# Patient Record
Sex: Female | Born: 1965
Health system: Southern US, Community
[De-identification: ages and names within clinical notes are randomized; demographics above are authoritative.]

## PROBLEM LIST (undated history)

## (undated) DIAGNOSIS — E041 Nontoxic single thyroid nodule: Secondary | ICD-10-CM

## (undated) DIAGNOSIS — K3184 Gastroparesis: Secondary | ICD-10-CM

## (undated) DIAGNOSIS — M84376A Stress fracture, unspecified foot, initial encounter for fracture: Secondary | ICD-10-CM

## (undated) DIAGNOSIS — M5126 Other intervertebral disc displacement, lumbar region: Secondary | ICD-10-CM

## (undated) DIAGNOSIS — F329 Major depressive disorder, single episode, unspecified: Secondary | ICD-10-CM

## (undated) DIAGNOSIS — J45909 Unspecified asthma, uncomplicated: Secondary | ICD-10-CM

## (undated) DIAGNOSIS — M4807 Spinal stenosis, lumbosacral region: Secondary | ICD-10-CM

## (undated) DIAGNOSIS — F32A Depression, unspecified: Secondary | ICD-10-CM

## (undated) DIAGNOSIS — IMO0002 Reserved for concepts with insufficient information to code with codable children: Secondary | ICD-10-CM

## (undated) DIAGNOSIS — K219 Gastro-esophageal reflux disease without esophagitis: Secondary | ICD-10-CM

## (undated) DIAGNOSIS — G509 Disorder of trigeminal nerve, unspecified: Secondary | ICD-10-CM

## (undated) DIAGNOSIS — T8859XA Other complications of anesthesia, initial encounter: Secondary | ICD-10-CM

## (undated) DIAGNOSIS — R071 Chest pain on breathing: Secondary | ICD-10-CM

## (undated) DIAGNOSIS — R0789 Other chest pain: Secondary | ICD-10-CM

## (undated) DIAGNOSIS — G4730 Sleep apnea, unspecified: Secondary | ICD-10-CM

## (undated) DIAGNOSIS — I10 Essential (primary) hypertension: Secondary | ICD-10-CM

## (undated) DIAGNOSIS — F419 Anxiety disorder, unspecified: Secondary | ICD-10-CM

## (undated) DIAGNOSIS — M719 Bursopathy, unspecified: Secondary | ICD-10-CM

## (undated) DIAGNOSIS — F952 Tourette's disorder: Secondary | ICD-10-CM

## (undated) DIAGNOSIS — M1990 Unspecified osteoarthritis, unspecified site: Secondary | ICD-10-CM

## (undated) DIAGNOSIS — T4145XA Adverse effect of unspecified anesthetic, initial encounter: Secondary | ICD-10-CM

## (undated) HISTORY — DX: Stress fracture, unspecified foot, initial encounter for fracture: M84.376A

## (undated) HISTORY — DX: Essential (primary) hypertension: I10

## (undated) HISTORY — DX: Reserved for concepts with insufficient information to code with codable children: IMO0002

## (undated) HISTORY — DX: Major depressive disorder, single episode, unspecified: F32.9

## (undated) HISTORY — DX: Gastroparesis: K31.84

## (undated) HISTORY — DX: Other intervertebral disc displacement, lumbar region: M51.26

## (undated) HISTORY — DX: Bursopathy, unspecified: M71.9

## (undated) HISTORY — DX: Nontoxic single thyroid nodule: E04.1

## (undated) HISTORY — DX: Chest pain on breathing: R07.1

## (undated) HISTORY — DX: Unspecified asthma, uncomplicated: J45.909

## (undated) HISTORY — DX: Gastro-esophageal reflux disease without esophagitis: K21.9

## (undated) HISTORY — DX: Disorder of trigeminal nerve, unspecified: G50.9

## (undated) HISTORY — DX: Other chest pain: R07.89

## (undated) HISTORY — PX: ESOPHAGOGASTRODUODENOSCOPY: SHX1529

## (undated) HISTORY — PX: COLONOSCOPY: SHX5424

## (undated) HISTORY — DX: Spinal stenosis, lumbosacral region: M48.07

## (undated) HISTORY — DX: Unspecified osteoarthritis, unspecified site: M19.90

## (undated) HISTORY — DX: Tourette's disorder: F95.2

## (undated) HISTORY — DX: Depression, unspecified: F32.A

## (undated) HISTORY — DX: Sleep apnea, unspecified: G47.30

---

## 2003-08-12 HISTORY — PX: CHOLECYSTECTOMY: SHX55

## 2006-08-11 HISTORY — PX: OTHER SURGICAL HISTORY: SHX169

## 2007-07-26 ENCOUNTER — Ambulatory Visit (HOSPITAL_COMMUNITY): Admission: RE | Admit: 2007-07-26 | Discharge: 2007-07-27 | Payer: Self-pay | Admitting: Neurosurgery

## 2007-08-12 HISTORY — PX: TEAR DUCT PROBING: SHX793

## 2008-06-29 ENCOUNTER — Encounter: Admission: RE | Admit: 2008-06-29 | Discharge: 2008-06-29 | Payer: Self-pay | Admitting: Neurosurgery

## 2010-12-24 NOTE — Op Note (Signed)
Penny Porter, Penny Porter                  ACCOUNT NO.:  [id number]   MEDICAL RECORD NO.:  [id number]          PATIENT TYPE:  OIB   LOCATION:  3599                         FACILITY:  [facility]   PHYSICIAN:  Donalee Citrin, M.D.        DATE OF BIRTH:  05/18/1966   DATE OF PROCEDURE:  07/26/2007  DATE OF DISCHARGE:                               OPERATIVE REPORT   PREOPERATIVE DIAGNOSIS:  C6 radiculopathy, left greater than right, from  cervical spondylosis with cervical spondylosis of C6.   PROCEDURE:  Intracervical diskectomy and fusion of C5-6, using a  cylinder allograft wedge and a 25-mm Venture plate with four 13 mm  variable angled screws.   SURGEON:  Donalee Citrin, M.D.   ASSISTANT:  Julio Sicks, M.D.   ANESTHESIA:  General endotracheal.   INDICATIONS FOR PROCEDURE:  The patient is a 45 year old female who has  had progressive worsening of the neck and bilateral arm pain through her  shoulders down below her elbows, with weakness in the left triceps at  about 4 to 4+/5.  Previously, an MRI scan showed a large central disk  herniation at C5-6, compressing the central part of the spinal cord, and  causing some foraminal stenosis, worse on the left.  The patient failed  outpatient conservative treatment with her physical exam and MRI  findings.  The patient was recommended to do a cervical diskectomy with  fusion.  The risks and benefits of the operation were explained to the  patient who understood and agreed to the proceed.   DESCRIPTION OF PROCEDURE:  The patient was brought to the OR and induced  under general anesthesia.  The patient was positioned supine, the neck  in slight extension, with 5 pounds of halter traction.  The right side  of the neck was prepped and draped in the usual sterile fashion.  A  preoperative x-ray localized the C4-5 disk space so a curvilinear  incision was made just inferior to this, just off the midline through  the anterior __________ and superficial layer of the  latissimus and  dissected in the __________ longitudinally.  The avascular plane of the  __________ was then developed up to the prevertebral fascia.  The  prevertebral fascia was then dissected with Kitners.  Intraoperative x-  ray confirmed mobilization of the C5-6 disk space at appropriate level  and annulotomy was done __________ scalpel marked the space and then the  longus coli was reflected laterally, and the self-retaining retractors  were placed.  At this point, the annulotomy was extended.  Pituitary  rongeurs were used to __________ of the annulus.  The high-speed drill  was used to drill down to the disk space, the posterior annulus and  posterior aspect of the complex.  At this point, the operating  microscope was draped under a field of microscopic illumination.  The  undersurface of the 4 and 5 end-plates were under-bitten.  There were  several  __________ disk remnants had migrated subligamentous and  compressing the central part of the spinal cord.  These were teased away  with  a black nerve hook and removed with pituitary rongeurs.  This  immediately decompressed the central canal.  The __________ was then  identified and teased off the dura, removed in piece-meal fashion.  Then  the undersurface of the end-plates were under-bitten reaching across to  both C6 pedicles and both C6 nerve roots were decompressed __________ on  the pedicle.  There was noted to be extensive epidural venous plexus  around each nerve root.  These were coagulated and divided under direct  visualization.  The proximal aspect of the C5-6 nerve roots were  immediately visualized and decompressed and confirmed with passing of  the nerve hook.  After adequate decompression was achieved, the end-  plates were scraped.  A size 7-mm graft was then inserted.  This had  good apposition of the end-plates.  We then inserted 1 to 2 mm deep to  the anterior vertebral body line.  Then a 25-mm ventral plate was  sized  and inserted, and four 13-mm screws were all placed.  All screws had  excellent purchase, and the locking mechanism was engaged.  At this  point, meticulous hemostasis was maintained.  The wound was copiously  irrigated.  The platysma was reapproximated with interrupted Vicryl and  the skin was closed with running 4-0 subcuticular, and Benzoin and Steri-  Strips were applied.  The patient was taken to the recovery room in  stable condition.  At the end of the case, instrument and sponge counts  were correct.           ______________________________  Donalee Citrin, M.D.     GC/MEDQ  D:  07/26/2007  T:  07/27/2007  Job:  [id number]

## 2011-05-19 LAB — CBC
Hemoglobin: 13.6
MCHC: 34.1
Platelets: 285
WBC: 6.8

## 2011-05-19 LAB — BASIC METABOLIC PANEL
Calcium: 8.7
Chloride: 103
Creatinine, Ser: 0.81
Sodium: 138

## 2011-06-18 DIAGNOSIS — R519 Headache, unspecified: Secondary | ICD-10-CM | POA: Insufficient documentation

## 2012-12-23 DIAGNOSIS — G500 Trigeminal neuralgia: Secondary | ICD-10-CM | POA: Insufficient documentation

## 2012-12-23 DIAGNOSIS — M47816 Spondylosis without myelopathy or radiculopathy, lumbar region: Secondary | ICD-10-CM | POA: Insufficient documentation

## 2013-01-13 DIAGNOSIS — K3184 Gastroparesis: Secondary | ICD-10-CM | POA: Insufficient documentation

## 2013-11-29 ENCOUNTER — Encounter (HOSPITAL_COMMUNITY): Payer: Self-pay | Admitting: Psychiatry

## 2013-11-29 ENCOUNTER — Ambulatory Visit (INDEPENDENT_AMBULATORY_CARE_PROVIDER_SITE_OTHER): Payer: PRIVATE HEALTH INSURANCE | Admitting: Psychiatry

## 2013-11-29 VITALS — BP 147/97 | HR 72 | Ht 69.0 in | Wt 256.0 lb

## 2013-11-29 DIAGNOSIS — F32A Depression, unspecified: Secondary | ICD-10-CM

## 2013-11-29 DIAGNOSIS — F329 Major depressive disorder, single episode, unspecified: Secondary | ICD-10-CM | POA: Insufficient documentation

## 2013-11-29 DIAGNOSIS — F3342 Major depressive disorder, recurrent, in full remission: Secondary | ICD-10-CM | POA: Insufficient documentation

## 2013-11-29 DIAGNOSIS — F952 Tourette's disorder: Secondary | ICD-10-CM | POA: Insufficient documentation

## 2013-11-29 DIAGNOSIS — F332 Major depressive disorder, recurrent severe without psychotic features: Secondary | ICD-10-CM

## 2013-11-29 MED ORDER — PIMOZIDE 1 MG PO TABS: ORAL_TABLET | ORAL | Status: DC

## 2013-11-29 MED ORDER — ARIPIPRAZOLE 2 MG PO TABS: 2.0000 mg | ORAL_TABLET | Freq: Every day | ORAL | Status: DC

## 2013-11-29 NOTE — Patient Instructions (Signed)
Orap 0.5mg  for 5 days then stop. After stopping wait 3 days then start taking 2mg  of Abilify daily.  Continue Wellbutrin   Due to untreated sleep apnea do not sedating meds at bedtime such as Clonazepam and pain meds.   Fax labs and EKG to [phone]

## 2013-11-29 NOTE — Progress Notes (Signed)
Psychiatric Assessment Adult  Patient Identification:  Penny Porter Date of Evaluation:  11/29/2013 Chief Complaint: I want off the Wellbutrin for depression History of Chief Complaint:   Chief Complaint  Patient presents with  . Medication Reaction    HPI Comments: Pt has been suffering from depression since 2005 that was well controlled with Wellbutrin. In 2014 due to multiple stressors- uncle died in 2013-04-01, mother dx with breast cancer, father passed in Sept 2014- depression worsened. Pt also had panic attacks in Sept that resolved on its own.   Now out on short term disability due to pain. Feels depressed due to pain. Feels down 2-3x/week for a few hours. Endorsing random crying spells that recently decreased in frequency. Working with a Transport planner and finds it helpful. Endorsing anhedonia and low motivation most of the time. Part of this is due to constant pain. Sleep is poor (fragmented) due to pain and pt does have sleep apnea and doesn't use a machine. Due to gastroparesis appetite is variable. Prednisone causes increased depression and mood liability currently on day 5 of 10 day taper.  Denies SI/HI. Has only 1-2 friends and denies relationships.   Diagnosed with motor and phonic tics in HS. Tics are pretty well controlled. States she notices with Clonazepam the abdominal tics are better. She was having stomach pulling and jerking sensations that were bothersome. In the past she was having arm and shoulder jerks but that improved with Orap. Vocal tics was like a "hiccup that wouldn't go away" improved with Orap, last time was in 1999. Today she notes only when stressed does she have arm jerks. Notes that she has sleep apnea but doesn't use a CPAP and is taking Clonazepam at bedtime.  Review of Systems  Constitutional: Positive for appetite change and fatigue.  HENT: Negative.   Eyes: Negative.   Respiratory: Negative.   Cardiovascular: Negative.   Gastrointestinal: Positive for  abdominal pain.  Musculoskeletal: Positive for arthralgias, back pain, gait problem and myalgias.  Neurological: Positive for weakness.  Psychiatric/Behavioral: Positive for sleep disturbance and dysphoric mood.   Physical Exam  Psychiatric: Her speech is normal and behavior is normal. Judgment and thought content normal. Cognition and memory are normal. She exhibits a depressed mood.    Depressive Symptoms: depressed mood, anhedonia, insomnia, fatigue, feelings of worthlessness/guilt, difficulty concentrating, hopelessness, decreased appetite,  (Hypo) Manic Symptoms:   Elevated Mood:  No Irritable Mood:  No Grandiosity:  No Distractibility:  No Labiality of Mood:  No Delusions:  No Hallucinations:  No Impulsivity:  No Sexually Inappropriate Behavior:  No Financial Extravagance:  No Flight of Ideas:  No  Anxiety Symptoms: Excessive Worry:  Anxiety has improved due to reading to distract herself. Last time was earlier this month and was mostly centered around obtaining disability and paying bills.  Panic Symptoms:  Last time was Sept 2014- SOB, throat closing up, excessive anxiety, came on suddenly and went away on its own.  Agoraphobia:  No Obsessive Compulsive: No  Symptoms: None, Specific Phobias:  No Social Anxiety:  No  Psychotic Symptoms:  Hallucinations: No None Delusions:  No Paranoia:  No   Ideas of Reference:  No  PTSD Symptoms: Ever had a traumatic exposure:  No Had a traumatic exposure in the last month:  No Re-experiencing: No None Hypervigilance:  No Hyperarousal: No None Avoidance: No None  Traumatic Brain Injury: No   Past Psychiatric History: Diagnosis: Depression and Tourettes  Hospitalizations: denies  Outpatient Care: [facility] until Dec  2014- was unhappy with treatment recommendations so stopped going  Substance Abuse Care: denies  Self-Mutilation: denies  Suicidal Attempts: denies  Violent Behaviors: denies   Past Medical  History:   Past Medical History  Diagnosis Date  . Asthma   . Depression   . GERD (gastroesophageal reflux disease)   . Hypertension   . Tourette disease   . Gastroparesis   . Trigeminal nerve disease   . Costochondral chest pain   . Arthritis     Back and hip  . Stenosis of lumbosacral spine   . Degenerative disk disease   . Bursitis   . Thyroid nodule   . Sleep apnea     not using CPAP   History of Loss of Consciousness:  No Seizure History:  No Cardiac History:  No Allergies:   Allergies  Allergen Reactions  . Amoxicillin Hives  . Bextra [Valdecoxib] Hives and Swelling  . Brintellix [Vortioxetine] Nausea And Vomiting  . Doxycycline Hyclate Hives and Swelling  . Fetzima [Levomilnacipran]     Tremors across head   . Gadolinium Derivatives Hives  . Lamictal [Lamotrigine] Hives  . Macrolides And Ketolides   . Naproxen Hypertension  . Percolone [Oxycodone] Hives  . Relafen [Nabumetone] Hypertension  . Risperdal [Risperidone]     Leg weakness    Current Medications:  Current Outpatient Prescriptions  Medication Sig Dispense Refill  . albuterol (PROVENTIL HFA;VENTOLIN HFA) 108 (90 BASE) MCG/ACT inhaler Inhale into the lungs every 6 (six) hours as needed for wheezing or shortness of breath.      Marland Kitchen aspirin 81 MG tablet Take 81 mg by mouth daily.      Marland Kitchen buPROPion (WELLBUTRIN XL) 300 MG 24 hr tablet Take 300 mg by mouth daily.      . Cholecalciferol (VITAMIN D-3) 5000 UNITS TABS Take 1 tablet by mouth daily.      . clonazePAM (KLONOPIN) 0.5 MG tablet Take 0.5 mg by mouth. Take 1/2 tab po TID and 1 tab po qHS      . cyclobenzaprine (FLEXERIL) 10 MG tablet Take 10 mg by mouth. Take 5-10mg  po TID as needed      . diclofenac (FLECTOR) 1.3 % PTCH Place 1 patch onto the skin 2 (two) times daily.      . Diclofenac (ZORVOLEX) 18 MG CAPS Take 18 mg by mouth daily.      . diclofenac sodium (VOLTAREN) 1 % GEL Apply 1 g topically 2 (two) times daily.      Marland Kitchen dicyclomine (BENTYL) 10  MG capsule Take 10 mg by mouth 3 (three) times daily before meals.      . felodipine (PLENDIL) 5 MG 24 hr tablet Take 5 mg by mouth daily.      . fexofenadine (ALLEGRA) 180 MG tablet Take 180 mg by mouth daily.      . furosemide (LASIX) 20 MG tablet 20 mg. Take 1-2 tabs po up to twice a day as needed for edema      . HYDROcodone-acetaminophen (NORCO) 7.5-325 MG per tablet Take 1 tablet by mouth every 6 (six) hours as needed for moderate pain.      . Linaclotide (LINZESS) 290 MCG CAPS capsule Take 290 mcg by mouth daily.      . metoprolol succinate (TOPROL-XL) 100 MG 24 hr tablet Take 50 mg by mouth daily. Take with or immediately following a meal.      . mometasone (ASMANEX) 220 MCG/INH inhaler Inhale 2 puffs into the lungs daily.      Marland Kitchen  olmesartan (BENICAR) 20 MG tablet Take 20 mg by mouth daily.      . ondansetron (ZOFRAN-ODT) 4 MG disintegrating tablet Take 4 mg by mouth 2 (two) times daily as needed for nausea or vomiting.      . Pimozide 1 MG TABS Take 1 mg by mouth daily.      . predniSONE (STERAPRED UNI-PAK) 10 MG tablet Take by mouth daily. taper      . pregabalin (LYRICA) 50 MG capsule Take 50 mg by mouth. Take 1 tab po qAM and 2 tabs qPM      . RABEprazole (ACIPHEX) 20 MG tablet Take 20 mg by mouth daily.      . ranitidine (ZANTAC) 300 MG tablet Take 300 mg by mouth at bedtime.      Marland Kitchen spironolactone (ALDACTONE) 25 MG tablet Take 25 mg by mouth daily.      . traMADol (ULTRAM) 50 MG tablet Take by mouth. 1-2 tabs po q6hr as needed for pain      . vitamin B-12 (CYANOCOBALAMIN) 1000 MCG tablet Take 1,000 mcg by mouth daily.      . vitamin E 400 UNIT capsule Take 400 Units by mouth daily.      . ARIPiprazole (ABILIFY) 2 MG tablet Take 1 tablet (2 mg total) by mouth daily.  30 tablet  1  . Pimozide 1 MG TABS Take 0.5mg  po qAM for 5 days then stop  3 tablet  0   No current facility-administered medications for this visit.    Previous Psychotropic Medications:  Medication Dose/Reaction    Lamictal Allergic rx  Risperdal Leg weakness  Brintellix GI distress  Clonidine ineffective            Substance Abuse History in the last 12 months: Substance Age of 1st Use Last Use Amount Specific Type  Nicotine  denies        Alcohol  denies        Cannabis  denies        Opiates  denies        Cocaine  denies        Methamphetamines  denies        LSD  denies        Ecstasy  denies         Benzodiazepines  denies        Caffeine  denies        Inhalants  denies        Others:                          Medical Consequences of Substance Abuse: denies   Legal Consequences of Substance Abuse: denies   Family Consequences of Substance Abuse: denies  Blackouts:  No DT's:  No Withdrawal Symptoms:  No None  Social History: Current Place of Residence: [place] of Birth: [place] Family Members: living with mom and 2 sisters Marital Status:  Single Children: denies Relationships: denies Education:  Administrator, sports Problems/Performance: fair Religious Beliefs/Practices: sort of but wants to rejoin [employer] History of Abuse: none 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 Prior to back pain she used to work in the [employer] History:  None. Legal History: denies Hobbies/Interests: reading  Family History:   Family History  Problem Relation Age of Onset  . Mental retardation Sister   . Dementia Father     Mental Status Examination/Evaluation: Objective:  Appearance: Fairly  Groomed  Engineer, water::  Good  Speech:  Clear and Coherent and Normal Rate  Volume:  Normal  Mood:  depressed  Affect:  Congruent and Full Range  Thought Process:  Goal Directed, Linear and Logical  Orientation:  Full (Time, Place, and Person)  Thought Content:  WDL  Suicidal Thoughts:  No  Homicidal Thoughts:  No  Judgement:  Fair  Insight:  Fair  Psychomotor Activity:  Normal  Akathisia:  No  Handed:  Right  AIMS (if indicated):  0   Assets:  Communication Skills Desire for Improvement Financial Resources/Insurance Housing Resilience Social Support Transportation    Laboratory/X-Ray Psychological Evaluation(s)   denies denies   Assessment:  Axis I: Major Depression, Recurrent severe and Tourettes  AXIS I Major Depression, Recurrent severe and Tourettes  AXIS II Deferred  AXIS III Past Medical History  Diagnosis Date  . Asthma   . Depression   . GERD (gastroesophageal reflux disease)   . Hypertension   . Tourette disease   . Gastroparesis   . Trigeminal nerve disease   . Costochondral chest pain   . Arthritis     Back and hip  . Stenosis of lumbosacral spine   . Degenerative disk disease   . Bursitis   . Thyroid nodule   . Sleep apnea     not using CPAP     AXIS IV occupational problems, other psychosocial or environmental problems and problems with access to health care services  AXIS V 51-60 moderate symptoms   Treatment Plan/Recommendations:  Plan of Care:   Start trial of Abilify 2mg  for mood augmentation and treatment of tics, risks/benefits and SE of the medication discussed. Pt verbalized understanding and verbal consent obtained for treatment.    Laboratory:  pt reports she had labs and EKG done recently and will fax over results.  Psychotherapy: continue therapy with Ms. Malachi Paradise  Medications: Continue Orap 0.5mg  for 5 days then stop. After stopping wait 3 days then start taking 2mg  of Abilify daily for mood augmentation and tics  Continue Wellbutrin XR 300mg  for depression  Due to untreated sleep apnea do not use sedating meds at bedtime such as Clonazepam and pain meds.    Routine PRN Medications:  Yes Clonazepam  Consultations: none at this time  Safety Concerns:  Pt denies SI and is at an acute low risk for suicide. Crisis plan reviewed and discussed. Pt verbalized understanding.   Other:  F/up in 2 months or sooner if needed    Charlcie Cradle, MD 4/21/20152:49  PM

## 2013-12-01 ENCOUNTER — Ambulatory Visit (HOSPITAL_COMMUNITY): Payer: Self-pay | Admitting: Psychiatry

## 2013-12-02 ENCOUNTER — Telehealth (HOSPITAL_COMMUNITY): Payer: Self-pay

## 2013-12-02 NOTE — Telephone Encounter (Signed)
I do not recommend any sedating medications at bedtime due to untreated sleep apnea. We discussed the risks of sedating meds and untreated sleep apnea during our last session.

## 2013-12-05 ENCOUNTER — Telehealth (HOSPITAL_COMMUNITY): Payer: Self-pay | Admitting: *Deleted

## 2013-12-05 NOTE — Telephone Encounter (Signed)
Patient left [id number].Took last dose of medication this morning.Had symtpoms yesterday of vocal tics/grunting and stomach jerking.Not sure if related to nerves or stopping medication.  Contacted pt: Not sure if related to stopping medication or not. Reminded pt she hd not stopped medication when symptoms occurred. Pt stated this was true. Also finished 10 day course of Prednisone yesterday, so not sure if this is related. Patient also stated she has not stopped Clonazepam at bedtime as directed, is continuing to take 1. Informed pt that per MD last instructions she is not to take at bedtime. Pt states she fears not sleeping and having tics get worse. Also states fear of what will happen once Orap is stopped and Abilify is started 3 days later. Informed patient that information will be placed in MD electronic  "In Basket" for review.

## 2013-12-05 NOTE — Telephone Encounter (Signed)
Opened twice in error

## 2013-12-06 NOTE — Telephone Encounter (Signed)
Penny Porter can start taking Abilify right away once she has completely stopped Orap.

## 2013-12-06 NOTE — Telephone Encounter (Signed)
Contacted pt: Per Dr. Doyne Keel, may start Abilify as soon as completely stopped Orap.Pt states last Orap was yesterday and she will star Abilify in the morning. Asked if MD had mentioned anything about klonopin.Informed her that message was only regarding Abilify

## 2013-12-13 ENCOUNTER — Telehealth (HOSPITAL_COMMUNITY): Payer: Self-pay | Admitting: *Deleted

## 2013-12-13 NOTE — Telephone Encounter (Signed)
Patient left YM:EBRA will Dr. Doyne Keel want to increase the Abilify? Stomach jerking/vocal tics from Tourettes continue.Also having arm jerking.Requests call from MD. Home [phone], cell [phone]

## 2013-12-13 NOTE — Telephone Encounter (Signed)
Pt reports she had stomach jerking for several days (occurring today) and one day of grunting last week. Arm jerking last occurred yesterday. Today she is feeling overwhelmed and depressed. States pain is getting worse. Short term disability is temporarily on hold. Also reports she needs to get new insurance.  Stomach jerking responded best when she took Klonopin at bedtime.   Taking Abilify 2mg  qD, Klonopin TID.  Plan: Continue Abilify 2mg  qD and Klonopin as prescribed.

## 2013-12-19 ENCOUNTER — Telehealth (HOSPITAL_COMMUNITY): Payer: Self-pay | Admitting: *Deleted

## 2013-12-19 DIAGNOSIS — F952 Tourette's disorder: Secondary | ICD-10-CM

## 2013-12-19 NOTE — Telephone Encounter (Signed)
Left VM--Patient left # [phone] or Home #[id number] like to talk with MD>Please call 5/12 after11:30.Was aked to call back and report on tics.Not really better and wants to discuss other treatment options.

## 2013-12-20 ENCOUNTER — Telehealth (HOSPITAL_COMMUNITY): Payer: Self-pay

## 2013-12-20 MED ORDER — ARIPIPRAZOLE 5 MG PO TABS: 5.0000 mg | ORAL_TABLET | Freq: Every day | ORAL | Status: DC

## 2013-12-20 MED ORDER — CLONAZEPAM 0.5 MG PO TABS: ORAL_TABLET | ORAL | Status: DC

## 2013-12-20 NOTE — Addendum Note (Signed)
Addended by: Charlcie Cradle on: 12/20/2013 02:24 PM   Modules accepted: Orders

## 2013-12-20 NOTE — Telephone Encounter (Signed)
Pt reports she is still having stomach jerking on a daily basis, almost all day. It sometimes prevents her from eating. With stress she also experiences arm jerking. Very rarely she will have a vocal tic.  A/P:Major Depression, Recurrent severe and Tourettes  Increase Abilify to 5mg  po qD. Continue Wellbutrin XR 300mg  for depression Continue Klonopin 0.5mg  BID and 0.25mg  qHS. F/up January 31, 2014 or sooner if needed.

## 2014-01-31 ENCOUNTER — Ambulatory Visit (INDEPENDENT_AMBULATORY_CARE_PROVIDER_SITE_OTHER): Payer: PRIVATE HEALTH INSURANCE | Admitting: Psychiatry

## 2014-01-31 ENCOUNTER — Encounter (HOSPITAL_COMMUNITY): Payer: Self-pay | Admitting: Psychiatry

## 2014-01-31 VITALS — BP 133/83 | HR 93 | Ht 69.0 in | Wt 248.0 lb

## 2014-01-31 DIAGNOSIS — F332 Major depressive disorder, recurrent severe without psychotic features: Secondary | ICD-10-CM

## 2014-01-31 DIAGNOSIS — F952 Tourette's disorder: Secondary | ICD-10-CM

## 2014-01-31 MED ORDER — ARIPIPRAZOLE 5 MG PO TABS: 7.5000 mg | ORAL_TABLET | Freq: Every day | ORAL | Status: DC

## 2014-01-31 MED ORDER — BUPROPION HCL ER (XL) 300 MG PO TB24: 300.0000 mg | ORAL_TABLET | Freq: Every day | ORAL | Status: DC

## 2014-01-31 MED ORDER — CLONAZEPAM 0.5 MG PO TABS: ORAL_TABLET | ORAL | Status: DC

## 2014-01-31 NOTE — Progress Notes (Signed)
[place] [phone] Progress Note  Penny Porter [id number] 48 y.o.  01/31/2014 12:59 PM  Chief Complaint: medication management- Overall doing ok  History of Present Illness: Today pt is feeling weak b/c she is recoving from dehydration and went to the ED for IV fluids. Appetite is poor and everything she eats makes her feel sick. She is going to a nutrionilist next week for gastroparesis. Pt is under the care of her PCP who advised small meals thru out the day.  Pt states overall she is doing well. Increased dose of Abilify is helping to control her stomach and arm jerks except when she is stressed. Occasionally will have some stomach jerks but with Klonopin the frequency is much decreased. Asking to take Klonopin at night b/c she feels that is when it helps the most to control the jerks. Pt takes her last Klonopin of the day at 5pm with dinner. Pt typically lies down around 10pm or after for sleep. She is happy to be off the Orap.   Depression is on/off. Back problems and pain causes a lot of sad mood. Crying spelsl are less frequent. Pt has on/off worthlessness and hopelessness mostly when she is in pain. Endorsing low motivation and anhedonia.   Sleep is poor. This is due to pain and sleep apnea. Pt tried using a CPAP for several months but could not tolerate it. Pt is not taking Klonopin at bedtime. She is getting about 4 hrs total. Pt is not napping during the day. Energy is low.  Anxiety is getting better. She is trying to keep herself distracted.  Suicidal Ideation: No Plan Formed: No Patient has means to carry out plan: No  Homicidal Ideation: No Plan Formed: No Patient has means to carry out plan: No  Review of Systems: Psychiatric: Agitation: Yes- irritable with family and will be verbally aggressive with them Hallucination: No Depressed Mood: Yes Insomnia: Yes Hypersomnia: No Altered Concentration: No Feels Worthless: Yes Grandiose Ideas: No Belief In Special  Powers: No New/Increased Substance Abuse: No Compulsions: No  Neurologic: Headache: No Seizure: No Paresthesias: No  Past Medical Family, Social History: Pt lives in [place] with her mom and 2 sisters. Pt is single and does not have children.  Pt denies hx of or current use of cigs, alcohol and illicit drugs.   Outpatient Encounter Prescriptions as of 01/31/2014  Medication Sig  . albuterol (PROVENTIL HFA;VENTOLIN HFA) 108 (90 BASE) MCG/ACT inhaler Inhale into the lungs every 6 (six) hours as needed for wheezing or shortness of breath.  . ARIPiprazole (ABILIFY) 5 MG tablet Take 1 tablet (5 mg total) by mouth daily.  Marland Kitchen aspirin 81 MG tablet Take 81 mg by mouth daily.  Marland Kitchen buPROPion (WELLBUTRIN XL) 300 MG 24 hr tablet Take 300 mg by mouth daily.  . Cholecalciferol (VITAMIN D-3) 5000 UNITS TABS Take 1 tablet by mouth daily.  . clonazePAM (KLONOPIN) 0.5 MG tablet Take 1 tab po qAM and lunch and 1/2 tab po qHS  . cyclobenzaprine (FLEXERIL) 10 MG tablet Take 10 mg by mouth. Take 5-10mg  po TID as needed  . diclofenac (FLECTOR) 1.3 % PTCH Place 1 patch onto the skin 2 (two) times daily.  . Diclofenac (ZORVOLEX) 18 MG CAPS Take 18 mg by mouth daily.  . diclofenac sodium (VOLTAREN) 1 % GEL Apply 1 g topically 2 (two) times daily.  Marland Kitchen dicyclomine (BENTYL) 10 MG capsule Take 10 mg by mouth 3 (three) times daily before meals.  . felodipine (PLENDIL) 5 MG  24 hr tablet Take 5 mg by mouth daily.  . fexofenadine (ALLEGRA) 180 MG tablet Take 180 mg by mouth daily.  Marland Kitchen HYDROcodone-acetaminophen (NORCO) 7.5-325 MG per tablet Take 1 tablet by mouth every 6 (six) hours as needed for moderate pain.  . Linaclotide (LINZESS) 290 MCG CAPS capsule Take 290 mcg by mouth daily.  . metoprolol succinate (TOPROL-XL) 100 MG 24 hr tablet Take 50 mg by mouth daily. Take with or immediately following a meal.  . mometasone (ASMANEX) 220 MCG/INH inhaler Inhale 2 puffs into the lungs daily.  Marland Kitchen olmesartan (BENICAR) 20 MG tablet  Take 20 mg by mouth daily.  . ondansetron (ZOFRAN-ODT) 4 MG disintegrating tablet Take 4 mg by mouth 2 (two) times daily as needed for nausea or vomiting.  . Pimozide 1 MG TABS Take 0.5mg  po qAM for 5 days then stop  . Pimozide 1 MG TABS Take 1 mg by mouth daily.  . predniSONE (STERAPRED UNI-PAK) 10 MG tablet Take by mouth daily. taper  . pregabalin (LYRICA) 50 MG capsule Take 50 mg by mouth. Take 1 tab po qAM and 2 tabs qPM  . RABEprazole (ACIPHEX) 20 MG tablet Take 20 mg by mouth daily.  . ranitidine (ZANTAC) 300 MG tablet Take 300 mg by mouth at bedtime.  Marland Kitchen spironolactone (ALDACTONE) 25 MG tablet Take 25 mg by mouth daily.  . traMADol (ULTRAM) 50 MG tablet Take by mouth. 1-2 tabs po q6hr as needed for pain  . vitamin B-12 (CYANOCOBALAMIN) 1000 MCG tablet Take 1,000 mcg by mouth daily.  . vitamin E 400 UNIT capsule Take 400 Units by mouth daily.  . furosemide (LASIX) 20 MG tablet 20 mg. Take 1-2 tabs po up to twice a day as needed for edema    Past Psychiatric History/Hospitalization(s): Anxiety: No Bipolar Disorder: No Depression: Yes Mania: No Psychosis: No Schizophrenia: No Personality Disorder: No Hospitalization for psychiatric illness: No History of Electroconvulsive Shock Therapy: No Prior Suicide Attempts: No  Physical Exam: Constitutional:  BP 133/83  Pulse 93  Ht 5\' 9"  (1.753 m)  Wt 248 lb (112.492 kg)  BMI 36.61 kg/m2  General Appearance: obese  Musculoskeletal: Strength & Muscle Tone: within normal limits Gait & Station: moving slowly and walking with cane due to back problems Patient leans: N/A  Mental Status Examination/Evaluation:  Objective: Appearance: fairly groomed, appears to be older than stated age  Attitude: Calm and cooperative  Eye Contact: Fair   Speech and Language: Clear and Coherent, spontaneous, normal rate  Volume: Normal   Mood: depressed  Affect: congruent  Thought Process: Coherent   Orientation: Full (Time, Place, and Person)    Thought Content: WDL  Suicidal Thoughts: No  Homicidal Thoughts: No   Judgement: Fair   General fund of knowledge: average  Insight: Present   Psychomotor Activity: Normal   Akathisia: No   Handed: Right   AIMS (if indicated): Facial and Oral Movements  Muscles of Facial Expression: None, normal  Lips and Perioral Area: None, normal  Jaw: None, normal  Tongue: None, normal Extremity Movements: Upper (arms, wrists, hands, fingers): None, normal  Lower (legs, knees, ankles, toes): None, normal,  Trunk Movements:  Neck, shoulders, hips: None, normal,  Overall Severity : Severity of abnormal movements (highest score from questions above): None, normal  Incapacitation due to abnormal movements: None, normal  Patient's awareness of abnormal movements (rate only patient's report): No Awareness, Dental Status  Current problems with teeth and/or dentures?: No  Does patient usually wear dentures?: No  Medical Decision Making (Choose Three): Established Problem, Stable/Improving (1), Review of Psycho-Social Stressors (1), Review or order clinical lab tests (1), Review of Medication Regimen & Side Effects (2) and Review of New Medication or Change in Dosage (2)  Assessment: AXIS I : Major Depression, Recurrent severe and Tourettes  AXIS II : Deferred  AXIS III  Past Medical History   Diagnosis  Date   .  Asthma    .  Depression    .  GERD (gastroesophageal reflux disease)    .  Hypertension    .  Tourette disease    .  Gastroparesis    .  Trigeminal nerve disease    .  Costochondral chest pain    .  Arthritis      Back and hip   .  Stenosis of lumbosacral spine    .  Degenerative disk disease    .  Bursitis    .  Thyroid nodule    .  Sleep apnea      not using CPAP   AXIS IV : occupational problems, other psychosocial or environmental problems and problems with access to health care services  AXIS V : 51-60 moderate symptoms   Treatment Plan/Recommendations:  Plan of  Care: Continue Abilify for mood augmentation and treatment of tics risks/benefits and SE of the medication discussed. Pt verbalized understanding and verbal consent obtained for treatment.  Affirm with the patient that the medications are taken as ordered. Patient expressed understanding of how their medications were to be used.   Laboratory: reviewed labs from last week WNL, copy placed in chart.   Psychotherapy: Therapy: brief supportive therapy provided. Discussed psychosocial stressors in detail.   continue therapy with Ms. Malachi Paradise   Medications: Continue Abilify and increase to 7.5mg  daily for mood augmentation and tics  Continue Wellbutrin XR 300mg  for depression  Continue Klonopin 0.5mg  po BID and 0.25 po qDinner prn anxiety Due to untreated sleep apnea pt advised not to use sedating meds at bedtime such as Clonazepam and pain meds.   Routine PRN Medications: Yes Clonazepam   Consultations: none at this time   Safety Concerns: Pt denies SI and is at an acute low risk for suicide. Crisis plan reviewed and discussed. Pt verbalized understanding.   Other: F/up in 2 months or sooner if needed     Charlcie Cradle, MD 01/31/2014

## 2014-02-06 ENCOUNTER — Encounter (HOSPITAL_COMMUNITY): Payer: Self-pay | Admitting: *Deleted

## 2014-02-06 ENCOUNTER — Telehealth (HOSPITAL_COMMUNITY): Payer: Self-pay | Admitting: *Deleted

## 2014-02-06 NOTE — Progress Notes (Signed)
[facility] authorized Abilify 7.5mg  (5 mg, 1 1/2 tablets, #45/month) 02/06/14 thru 02/07/15 Auth # [id number] Pharmacy and patient notified

## 2014-02-06 NOTE — Telephone Encounter (Signed)
Initiated Prior Auth for Abilify 7.5 mg Per Rep at [facility], will need to fax in request as quantity of #45 per month exceeds maximum allowed of 1 tablet per day Request faxed to [phone], marked "URGENT" as patient is/will be out of medicine  Phoned pt - left message that PA faxed and waiting on insurance to answer

## 2014-02-15 ENCOUNTER — Telehealth (HOSPITAL_COMMUNITY): Payer: Self-pay | Admitting: *Deleted

## 2014-02-15 DIAGNOSIS — F3289 Other specified depressive episodes: Secondary | ICD-10-CM

## 2014-02-15 DIAGNOSIS — F329 Major depressive disorder, single episode, unspecified: Secondary | ICD-10-CM

## 2014-02-15 MED ORDER — BUPROPION HCL ER (XL) 300 MG PO TB24: 300.0000 mg | ORAL_TABLET | Freq: Every day | ORAL | Status: DC

## 2014-02-15 NOTE — Telephone Encounter (Signed)
PATIENT LEFT SE:[id number] is requiring her to change to [facility] for all meds, with 90 day supply.Has never filled RX for Wellbutrin from Dr. Doyne Keel on 01/31/14.Needs new Rx sent to [facility] for this medicine. New RX sent to [facility] for 90 day supply of Wellbutrin

## 2014-04-03 ENCOUNTER — Other Ambulatory Visit (HOSPITAL_COMMUNITY): Payer: Self-pay | Admitting: Psychiatry

## 2014-04-04 ENCOUNTER — Encounter (HOSPITAL_COMMUNITY): Payer: Self-pay | Admitting: Psychiatry

## 2014-04-04 ENCOUNTER — Ambulatory Visit (INDEPENDENT_AMBULATORY_CARE_PROVIDER_SITE_OTHER): Payer: PRIVATE HEALTH INSURANCE | Admitting: Psychiatry

## 2014-04-04 VITALS — BP 130/87 | HR 103 | Ht 69.0 in | Wt 251.6 lb

## 2014-04-04 DIAGNOSIS — F329 Major depressive disorder, single episode, unspecified: Secondary | ICD-10-CM

## 2014-04-04 DIAGNOSIS — F3289 Other specified depressive episodes: Secondary | ICD-10-CM

## 2014-04-04 DIAGNOSIS — F332 Major depressive disorder, recurrent severe without psychotic features: Secondary | ICD-10-CM

## 2014-04-04 DIAGNOSIS — F952 Tourette's disorder: Secondary | ICD-10-CM

## 2014-04-04 MED ORDER — CLONAZEPAM 0.5 MG PO TABS: ORAL_TABLET | ORAL | Status: DC

## 2014-04-04 MED ORDER — ARIPIPRAZOLE 10 MG PO TABS: 10.0000 mg | ORAL_TABLET | Freq: Every day | ORAL | Status: DC

## 2014-04-04 MED ORDER — BUPROPION HCL ER (XL) 300 MG PO TB24: 300.0000 mg | ORAL_TABLET | Freq: Every day | ORAL | Status: DC

## 2014-04-04 NOTE — Progress Notes (Signed)
Patient ID: Penny Porter, female   DOB: [birth year], 48 y.o.   MRN: [id number]  [place] [address] Progress Note  Penny Porter [id number] 48 y.o.  04/04/2014 1:21 PM  Chief Complaint: "pain is making things hard"  History of Present Illness: Pain is making things hard for her. It limits her ability to do things. She has been diagnosed with Costochondriasis. Pain doesn't allow her to sleep for more than 3-5 hrs. Pt is not using her CPAP.  Depression comes and goes. Pt is coloring and it helps to distract her. Motivation remains low and she reports continued anhedonia. A lot of times she doesn't care to do anything. Pt has on/off worthlessness and hopelessness mostly when she is in pain. She continues to have crying spells.   Driving and being in crowds cause anxiety and panic attacks. Anxiety comes and goes. Anxiety is her main concern.   Tics come and go depending on her anxiety level. Vocal grunts, stomach jerking and arm jerking are all possible reactions. Her neurologist does not want Abilify dose changed. Pt is taking Klonopin TID and some days it doesn't seem to help.   Her therapist is retiring and pt is starting work with a new therapist next week.   Appetite is variable and a lot of things she eats makes her feel sick. Pt is eating small meals and still has issues with N/V.  Energy is generally low. Pt is not napping during the day.   Pt is taking Wellbutrin and Abilify as prescribed and denies SE. Increase dose of Abilify has helped with mood and she is more interactive with people.   Suicidal Ideation: No Plan Formed: No Patient has means to carry out plan: No  Homicidal Ideation: No Plan Formed: No Patient has means to carry out plan: No  Review of Systems: Psychiatric: Agitation: Yes- irritable with family and will be verbally aggressive with them Hallucination: No Depressed Mood: Yes Insomnia: Yes Hypersomnia: No Altered Concentration: Yes Feels Worthless:  Yes Grandiose Ideas: No Belief In Special Powers: No New/Increased Substance Abuse: No Compulsions: No  Neurologic: Headache: No Seizure: No Paresthesias: No  Past Medical Family, Social History: Pt lives in [place] with her mom and 2 sisters. Pt is single and does not have children.  Pt denies hx of or current use of cigs, alcohol and illicit drugs.  Family History  Problem Relation Age of Onset  . Mental retardation Sister   . Dementia Father   . Suicidality Neg Hx    Past Medical History  Diagnosis Date  . Asthma   . Depression   . GERD (gastroesophageal reflux disease)   . Hypertension   . Tourette disease   . Gastroparesis   . Trigeminal nerve disease   . Costochondral chest pain   . Arthritis     Back and hip  . Stenosis of lumbosacral spine   . Degenerative disk disease   . Bursitis   . Thyroid nodule   . Sleep apnea     not using CPAP   Outpatient Encounter Prescriptions as of 04/04/2014  Medication Sig  . albuterol (PROVENTIL HFA;VENTOLIN HFA) 108 (90 BASE) MCG/ACT inhaler Inhale into the lungs every 6 (six) hours as needed for wheezing or shortness of breath.  . ARIPiprazole (ABILIFY) 5 MG tablet Take 1.5 tablets (7.5 mg total) by mouth daily.  Marland Kitchen aspirin 81 MG tablet Take 81 mg by mouth daily.  . beclomethasone (QVAR) 80 MCG/ACT inhaler Inhale into the lungs 2 (two)  times daily.  Marland Kitchen buPROPion (WELLBUTRIN XL) 300 MG 24 hr tablet Take 1 tablet (300 mg total) by mouth daily.  . clonazePAM (KLONOPIN) 0.5 MG tablet Take 1 tab po qAM and lunch and 1/2 tab po qHS  . cyclobenzaprine (FLEXERIL) 10 MG tablet Take 10 mg by mouth. Take 5-10mg  po TID as needed  . diclofenac (FLECTOR) 1.3 % PTCH Place 1 patch onto the skin 2 (two) times daily.  . Diclofenac (ZORVOLEX) 18 MG CAPS Take 18 mg by mouth daily.  . diclofenac sodium (VOLTAREN) 1 % GEL Apply 1 g topically 2 (two) times daily.  Marland Kitchen dicyclomine (BENTYL) 10 MG capsule Take 10 mg by mouth 3 (three) times daily before  meals.  . felodipine (PLENDIL) 5 MG 24 hr tablet Take 7.5 mg by mouth daily.   . fexofenadine (ALLEGRA) 180 MG tablet Take 180 mg by mouth daily.  . furosemide (LASIX) 20 MG tablet 20 mg. Take 1-2 tabs po up to twice a day as needed for edema  . HYDROcodone-acetaminophen (NORCO) 7.5-325 MG per tablet Take 1 tablet by mouth every 6 (six) hours as needed for moderate pain.  . Linaclotide (LINZESS) 290 MCG CAPS capsule Take 290 mcg by mouth daily.  Marland Kitchen lubiprostone (AMITIZA) 24 MCG capsule Take 24 mcg by mouth 2 (two) times daily with a meal.  . olmesartan (BENICAR) 20 MG tablet Take 20 mg by mouth daily.  . ondansetron (ZOFRAN-ODT) 4 MG disintegrating tablet Take 4 mg by mouth 2 (two) times daily as needed for nausea or vomiting.  . pregabalin (LYRICA) 50 MG capsule Take 50 mg by mouth. Take 1 tab po qAM and 2 tabs qPM  . RABEprazole (ACIPHEX) 20 MG tablet Take 20 mg by mouth daily.  . ranitidine (ZANTAC) 300 MG tablet Take 300 mg by mouth at bedtime.  Marland Kitchen spironolactone (ALDACTONE) 25 MG tablet Take 25 mg by mouth daily.  . traMADol (ULTRAM) 50 MG tablet Take by mouth. 1-2 tabs po q6hr as needed for pain  . vitamin B-12 (CYANOCOBALAMIN) 1000 MCG tablet Take 1,000 mcg by mouth daily.  . Cholecalciferol (VITAMIN D-3) 5000 UNITS TABS Take 1 tablet by mouth daily.  . Pimozide 1 MG TABS Take 0.5mg  po qAM for 5 days then stop  . Pimozide 1 MG TABS Take 1 mg by mouth daily.  . predniSONE (STERAPRED UNI-PAK) 10 MG tablet Take by mouth daily. taper  . vitamin E 400 UNIT capsule Take 400 Units by mouth daily.  . [DISCONTINUED] metoprolol succinate (TOPROL-XL) 100 MG 24 hr tablet Take 50 mg by mouth daily. Take with or immediately following a meal.  . [DISCONTINUED] mometasone (ASMANEX) 220 MCG/INH inhaler Inhale 2 puffs into the lungs daily.    Past Psychiatric History/Hospitalization(s): Anxiety: No Bipolar Disorder: No Depression: Yes Mania: No Psychosis: No Schizophrenia: No Personality Disorder:  No Hospitalization for psychiatric illness: No History of Electroconvulsive Shock Therapy: No Prior Suicide Attempts: No  Physical Exam: Constitutional:  BP 130/87  Pulse 103  Ht 5\' 9"  (1.753 m)  Wt 251 lb 9.6 oz (114.125 kg)  BMI 37.14 kg/m2  General Appearance: obese  Musculoskeletal: Strength & Muscle Tone: within normal limits Gait & Station: moving slowly and walking with cane due to back problems Patient leans: N/A  Mental Status Examination/Evaluation: Objective: Attitude: Calm and cooperative  Appearance: Fairly Groomed, appears to be stated age  Eye Contact::  Good  Speech:  Clear and Coherent and Normal Rate  Volume:  Normal  Mood:  depressed  Affect:  Congruent  Thought Process:  Goal Directed, Linear and Logical  Orientation:  Full (Time, Place, and Person)  Thought Content:  Negative  Suicidal Thoughts:  No  Homicidal Thoughts:  No  Judgement:  Fair  Insight:  Fair  Concentration: fair  Memory: Immediate-fair Recent-poor Remote-fair  Recall: fair  Language: fair  Gait and Station: normal  [employer] of Knowledge: average  Psychomotor Activity:  Normal, walking slower with cane due to pain  Akathisia:  No  Handed:  Right     Medical Decision Making (Choose Three): Review of Psycho-Social Stressors (1), Established Problem, Worsening (2), Review of Medication Regimen & Side Effects (2) and Review of New Medication or Change in Dosage (2)   Assessment: AXIS I : Major Depression, Recurrent severe and Tourettes  AXIS II : Deferred  AXIS III  Past Medical History   Diagnosis  Date   .  Asthma    .  Depression    .  GERD (gastroesophageal reflux disease)    .  Hypertension    .  Tourette disease    .  Gastroparesis    .  Trigeminal nerve disease    .  Costochondral chest pain    .  Arthritis      Back and hip   .  Stenosis of lumbosacral spine    .  Degenerative disk disease    .  Bursitis    .  Thyroid nodule    .  Sleep apnea      not  using CPAP   AXIS IV : occupational problems, other psychosocial or environmental problems and problems with access to health care services  AXIS V : 51-60 moderate symptoms      Treatment Plan/Recommendations:  Plan of Care:  Medication management with supportive therapy. Risks/benefits and SE of the medication discussed. Pt verbalized understanding and verbal consent obtained for treatment.  Affirm with the patient that the medications are taken as ordered. Patient expressed understanding of how their medications were to be used.  - worsening of symptoms   Laboratory: reviewed labs from last week WNL, copy placed in chart. Pt will sign MR to get recent EKG  Psychotherapy: Therapy: brief supportive therapy provided. Discussed psychosocial stressors in detail.   Encouraged to continue therapy   Medications: Continue Abilify and increase to 10mg  daily for mood augmentation and tics  Continue Wellbutrin XR 300mg  for depression  Continue Klonopin 0.5mg  po BID and 0.25 po qDinner prn anxiety Due to untreated sleep apnea pt advised not to use sedating meds at bedtime such as Clonazepam and pain meds.   Routine PRN Medications: Yes Clonazepam   Consultations: none at this time   Safety Concerns: Pt denies SI and is at an acute low risk for suicide.Patient told to call clinic if any problems occur. Patient advised to go to ER if they should develop SI/HI, side effects, or if symptoms worsen. Has crisis numbers to call if needed. Pt verbalized understanding.   Other: F/up in 3 months or sooner if needed     Charlcie Cradle, MD 04/04/2014

## 2014-05-04 ENCOUNTER — Other Ambulatory Visit (HOSPITAL_COMMUNITY): Payer: Self-pay | Admitting: *Deleted

## 2014-05-04 DIAGNOSIS — F3289 Other specified depressive episodes: Secondary | ICD-10-CM

## 2014-05-04 DIAGNOSIS — F329 Major depressive disorder, single episode, unspecified: Secondary | ICD-10-CM

## 2014-05-04 NOTE — Telephone Encounter (Signed)
Phoned pt and left message for pt: Last RX sent on 04/04/14.Next one will be due around 07/05/14. Too early.

## 2014-05-05 ENCOUNTER — Telehealth (HOSPITAL_COMMUNITY): Payer: Self-pay | Admitting: *Deleted

## 2014-05-05 DIAGNOSIS — F331 Major depressive disorder, recurrent, moderate: Secondary | ICD-10-CM

## 2014-05-05 NOTE — Telephone Encounter (Signed)
Pt called asking for refill of Wellbutrin to be sent to [facility]. She states they never received it when it was supposed to have been sent in August. Asking where the prescription was sent to. Also asking for a 14 day supply to be sent to [facility]. Wanted to speak with Georgina Pillion., RN because Carlyon Prows had called her yesterday 05/04/14 but she was unable to answer the call.

## 2014-05-11 ENCOUNTER — Other Ambulatory Visit (HOSPITAL_COMMUNITY): Payer: Self-pay | Admitting: Psychiatry

## 2014-05-11 ENCOUNTER — Other Ambulatory Visit (HOSPITAL_COMMUNITY): Payer: Self-pay | Admitting: *Deleted

## 2014-05-11 MED ORDER — BUPROPION HCL ER (XL) 300 MG PO TB24: 300.0000 mg | ORAL_TABLET | Freq: Every day | ORAL | Status: DC

## 2014-05-11 NOTE — Addendum Note (Signed)
Addended by: Charlcie Cradle on: 05/11/2014 09:58 AM   Modules accepted: Orders

## 2014-05-29 HISTORY — PX: ELBOW SURGERY: SHX618

## 2014-06-06 ENCOUNTER — Other Ambulatory Visit (HOSPITAL_COMMUNITY): Payer: Self-pay | Admitting: Psychiatry

## 2014-06-08 ENCOUNTER — Telehealth (HOSPITAL_COMMUNITY): Payer: Self-pay | Admitting: *Deleted

## 2014-06-08 NOTE — Telephone Encounter (Signed)
Patient left VM: She is taking Abilify and Clonazepam as directed. Occassionally hass pells where she gets really nervous. Is there anything she should be doing, or just let them pass -which is what she is currently doing. She is unable to lsee therapist at thsi point due to cost. Okay to leave message on cell [phone] (in PT for arm this afternoon)

## 2014-06-13 NOTE — Telephone Encounter (Signed)
Pt reports she has gotten nervous a couple of times. Felt nervous all over and couldn't stand the feeling. Pt was not able to identify the cause. Asking for coping techniques.  Recommended pt distract herself by focusing on breathing or other activities such as listening to music or reading. Also could try removing herself from the situation. Lastly recommend pt try relaxation such as hot shower or other things that promote relaxation. Pt verbalized understanding.

## 2014-06-27 ENCOUNTER — Other Ambulatory Visit (HOSPITAL_COMMUNITY): Payer: Self-pay | Admitting: Psychiatry

## 2014-07-11 ENCOUNTER — Encounter (HOSPITAL_COMMUNITY): Payer: Self-pay | Admitting: Psychiatry

## 2014-07-11 ENCOUNTER — Ambulatory Visit (INDEPENDENT_AMBULATORY_CARE_PROVIDER_SITE_OTHER): Payer: PRIVATE HEALTH INSURANCE | Admitting: Psychiatry

## 2014-07-11 VITALS — BP 132/92 | HR 86 | Ht 69.0 in | Wt 252.8 lb

## 2014-07-11 DIAGNOSIS — F331 Major depressive disorder, recurrent, moderate: Secondary | ICD-10-CM

## 2014-07-11 DIAGNOSIS — F332 Major depressive disorder, recurrent severe without psychotic features: Secondary | ICD-10-CM | POA: Insufficient documentation

## 2014-07-11 DIAGNOSIS — F3342 Major depressive disorder, recurrent, in full remission: Secondary | ICD-10-CM

## 2014-07-11 DIAGNOSIS — F952 Tourette's disorder: Secondary | ICD-10-CM

## 2014-07-11 HISTORY — DX: Major depressive disorder, recurrent, in full remission: F33.42

## 2014-07-11 MED ORDER — BUPROPION HCL ER (XL) 300 MG PO TB24: 300.0000 mg | ORAL_TABLET | Freq: Every day | ORAL | Status: DC

## 2014-07-11 MED ORDER — ARIPIPRAZOLE 15 MG PO TABS: 15.0000 mg | ORAL_TABLET | Freq: Every day | ORAL | Status: DC

## 2014-07-11 MED ORDER — CLONAZEPAM 0.5 MG PO TABS: ORAL_TABLET | ORAL | Status: DC

## 2014-07-11 NOTE — Progress Notes (Signed)
[facility] Progress Note  Penny Porter [id number] 48 y.o.  07/11/2014 11:14 AM  Chief Complaint: "good and bad days"  History of Present Illness: Pt is trying to get on disability and is fighting for it. It bothers her. Finances are a big stressors. This is causing a lot of anxiety. Anxiety comes and goes thru out the week. She has racing thoughts. She has a few panic attacks over the last several months. Driving and being in crowds cause anxiety and panic attacks.   Pt has about 2-3 bad days a week. She has sad mood and hopelessness. Endorsing on/off low motivation, anhedonia and worthlessness due to pain. Denies crying spells and isolation. She is always around family. Pt feels she has limited social support.   Pain is making things hard for her. She is having a lot of hip and back pain. It limits her ability to do things. She has been diagnosed with Costochondriasis. Pain doesn't allow her to sleep for more than 3-5 hrs. Pt is not using her CPAP.  Appetite is variable and a lot of things she eats makes her feel sick. Pt is eating small meals and still has issues with N/V due to gasteroparesis.  Energy is generally low. Pt is not napping during the day.  Tics come and go depending on her anxiety and stress level. Vocal grunts, stomach jerking, eye squinting and arm jerking are all possible reactions. Her neurologist previously did not want Abilify dose changed. Pt is no longer f/up with the neurologist. Pt is taking Klonopin TID and some days it doesn't seem to help. States stomach jerking never really goes away.   Pt does not have a therapist due to cost.   Pt is taking Wellbutrin and Abilify as prescribed and denies SE. States meds are helping.   Suicidal Ideation: No Plan Formed: No Patient has means to carry out plan: No  Homicidal Ideation: No Plan Formed: No Patient has means to carry out plan: No  Review of Systems: Psychiatric: Agitation: Yes- irritable  with family and will be verbally aggressive with them Hallucination: No Depressed Mood: Yes Insomnia: Yes Hypersomnia: No Altered Concentration: No Feels Worthless: Yes Grandiose Ideas: No Belief In Special Powers: No New/Increased Substance Abuse: No Compulsions: No  Neurologic: Headache: No Seizure: No Paresthesias: No   Review of Systems  Constitutional: Negative for fever and chills.  HENT: Negative for congestion, nosebleeds and sore throat.   Eyes: Negative for blurred vision, double vision and redness.  Respiratory: Negative for cough, sputum production and shortness of breath.   Cardiovascular: Positive for chest pain. Negative for palpitations and leg swelling.  Gastrointestinal: Positive for nausea, vomiting and constipation. Negative for heartburn, abdominal pain and diarrhea.  Musculoskeletal: Positive for back pain and joint pain. Negative for myalgias and neck pain.  Skin: Positive for rash. Negative for itching.  Neurological: Negative for dizziness, sensory change, seizures, loss of consciousness, weakness and headaches.  Psychiatric/Behavioral: Positive for depression. Negative for suicidal ideas, hallucinations and substance abuse. The patient is nervous/anxious and has insomnia.      Past Medical Family, Social History: Pt lives in [place] with her mom and 2 sisters. Pt is single and does not have children.  Pt denies hx of or current use of cigs, alcohol and illicit drugs.  Family History  Problem Relation Age of Onset  . Mental retardation Sister   . Dementia Father   . Suicidality Neg Hx    Past Medical History  Diagnosis  Date  . Asthma   . Depression   . GERD (gastroesophageal reflux disease)   . Hypertension   . Tourette disease   . Gastroparesis   . Trigeminal nerve disease   . Costochondral chest pain   . Arthritis     Back and hip  . Stenosis of lumbosacral spine   . Degenerative disk disease   . Bursitis   . Thyroid nodule   . Sleep  apnea     not using CPAP   Outpatient Encounter Prescriptions as of 07/11/2014  Medication Sig  . albuterol (PROVENTIL HFA;VENTOLIN HFA) 108 (90 BASE) MCG/ACT inhaler Inhale into the lungs every 6 (six) hours as needed for wheezing or shortness of breath.  . [place], Misc. (PROMISEB EX) Apply topically.  . ARIPiprazole (ABILIFY) 10 MG tablet TAKE 1 TABLET (10 MG TOTAL) BY MOUTH DAILY.  Marland Kitchen aspirin 81 MG tablet Take 81 mg by mouth daily.  . beclomethasone (QVAR) 80 MCG/ACT inhaler Inhale into the lungs 2 (two) times daily.  Marland Kitchen buPROPion (WELLBUTRIN XL) 300 MG 24 hr tablet Take 1 tablet (300 mg total) by mouth daily.  Marland Kitchen buPROPion (WELLBUTRIN XL) 300 MG 24 hr tablet Take 1 tablet (300 mg total) by mouth daily.  . Cholecalciferol (VITAMIN D-3) 5000 UNITS TABS Take 1 tablet by mouth daily.  . clonazePAM (KLONOPIN) 0.5 MG tablet Take 1 tab po qAM, 1 tab po qLunch and 1/2 tab po qbedtime  . cyclobenzaprine (FLEXERIL) 10 MG tablet Take 10 mg by mouth. Take 5-10mg  po TID as needed  . diclofenac (FLECTOR) 1.3 % PTCH Place 1 patch onto the skin 2 (two) times daily.  . Diclofenac (ZORVOLEX) 18 MG CAPS Take 18 mg by mouth daily.  . diclofenac sodium (VOLTAREN) 1 % GEL Apply 1 g topically 2 (two) times daily.  Marland Kitchen dicyclomine (BENTYL) 10 MG capsule Take 10 mg by mouth 3 (three) times daily before meals.  . felodipine (PLENDIL) 5 MG 24 hr tablet Take 7.5 mg by mouth daily.   . fexofenadine (ALLEGRA) 180 MG tablet Take 180 mg by mouth daily.  . furosemide (LASIX) 20 MG tablet 20 mg. Take 1-2 tabs po up to twice a day as needed for edema  . HYDROcodone-acetaminophen (NORCO) 7.5-325 MG per tablet Take 1 tablet by mouth every 6 (six) hours as needed for moderate pain.  Marland Kitchen ketoconazole (NIZORAL) 2 % cream Apply 1 application topically daily.  Marland Kitchen ketoconazole (NIZORAL) 2 % shampoo Apply 1 application topically 2 (two) times a week.  . Linaclotide (LINZESS) 290 MCG CAPS capsule Take 290 mcg by mouth  daily.  Marland Kitchen lubiprostone (AMITIZA) 24 MCG capsule Take 24 mcg by mouth 2 (two) times daily with a meal.  . medroxyPROGESTERone (PROVERA) 10 MG tablet Take 10 mg by mouth daily.  . metroNIDAZOLE (METROCREAM) 0.75 % cream Apply topically 2 (two) times daily.  Marland Kitchen olmesartan (BENICAR) 20 MG tablet Take 20 mg by mouth daily.  . ondansetron (ZOFRAN-ODT) 4 MG disintegrating tablet Take 4 mg by mouth 2 (two) times daily as needed for nausea or vomiting.  . predniSONE (STERAPRED UNI-PAK) 10 MG tablet Take by mouth daily. taper  . pregabalin (LYRICA) 50 MG capsule Take 50 mg by mouth. Take 1 tab po qAM and 2 tabs qPM  . RABEprazole (ACIPHEX) 20 MG tablet Take 20 mg by mouth daily.  . ranitidine (ZANTAC) 300 MG tablet Take 300 mg by mouth at bedtime.  Marland Kitchen spironolactone (ALDACTONE) 25 MG tablet Take 25 mg by mouth  daily.  . traMADol (ULTRAM) 50 MG tablet Take by mouth. 1-2 tabs po q6hr as needed for pain  . triamcinolone cream (KENALOG) 0.1 % Apply 1 application topically 2 (two) times daily.  . vitamin B-12 (CYANOCOBALAMIN) 1000 MCG tablet Take 1,000 mcg by mouth daily.  . vitamin E 400 UNIT capsule Take 400 Units by mouth daily.    Past Psychiatric History/Hospitalization(s): Anxiety: No Bipolar Disorder: No Depression: Yes Mania: No Psychosis: No Schizophrenia: No Personality Disorder: No Hospitalization for psychiatric illness: No History of Electroconvulsive Shock Therapy: No Prior Suicide Attempts: No  Physical Exam: Constitutional:  BP 132/92 mmHg  Pulse 86  Ht 5\' 9"  (1.753 m)  Wt 252 lb 12.8 oz (114.669 kg)  BMI 37.31 kg/m2  General Appearance: alert, oriented, no acute distress and obese  Musculoskeletal: Strength & Muscle Tone: within normal limits Gait & Station: moving slowly and walking with cane due to back problems Patient leans: N/A  Mental Status Examination/Evaluation: Objective: Attitude: Calm and cooperative  Appearance: Casual, appears to be stated age  Eye  Contact::  Good  Speech:  Clear and Coherent and Normal Rate  Volume:  Normal  Mood:  depressed  Affect:  Congruent  Thought Process:  Goal Directed, Linear and Logical  Orientation:  Full (Time, Place, and Person)  Thought Content:  Negative  Suicidal Thoughts:  No  Homicidal Thoughts:  No  Judgement:  Fair  Insight:  Fair  Concentration: good  Memory: Immediate-fair Recent-fair Remote-fair  Recall: fair  Language: fair  Gait and Station: normal  [employer] of Knowledge: average  Psychomotor Activity:  Normal walking slow with a cane  Akathisia:  No  Handed:  Right  AIMS (if indicated):  Facial and Oral Movements  Muscles of Facial Expression: None, normal  Lips and Perioral Area: None, normal  Jaw: None, normal  Tongue: None, normal Extremity Movements: Upper (arms, wrists, hands, fingers): None, normal  Lower (legs, knees, ankles, toes): None, normal,  Trunk Movements:  Neck, shoulders, hips: None, normal,  Overall Severity : Severity of abnormal movements (highest score from questions above): None, normal  Incapacitation due to abnormal movements: None, normal  Patient's awareness of abnormal movements (rate only patient's report): No Awareness, Dental Status  Current problems with teeth and/or dentures?: No  Does patient usually wear dentures?: No    Assets:  Communication Skills Desire for Improvement Housing Administrator, Civil Service (Choose Three): Review of Psycho-Social Stressors (1), Review or order clinical lab tests (1), Established Problem, Worsening (2), Review of Medication Regimen & Side Effects (2) and Review of New Medication or Change in Dosage (2)   Assessment: AXIS I : Major Depression, Recurrent severe without psychotic features and Tourettes  AXIS II : Deferred  AXIS III  Past Medical History  Diagnosis Date  . Asthma   . Depression   . GERD (gastroesophageal reflux disease)   . Hypertension   .  Tourette disease   . Gastroparesis   . Trigeminal nerve disease   . Costochondral chest pain   . Arthritis     Back and hip  . Stenosis of lumbosacral spine   . Degenerative disk disease   . Bursitis   . Thyroid nodule   . Sleep apnea     not using CPAP   AXIS IV : occupational problems, other psychosocial or environmental problems and problems with access to health care services  AXIS V : 51-60 moderate symptoms  Treatment Plan/Recommendations:  Plan of Care:  Medication management with supportive therapy. Risks/benefits and SE of the medication discussed. Pt verbalized understanding and verbal consent obtained for treatment.  Affirm with the patient that the medications are taken as ordered. Patient expressed understanding of how their medications were to be used.  - worsening of symptoms   Laboratory: reviewed EKG 01/27/2014 QTc 406, NSR  Psychotherapy: Therapy: brief supportive therapy provided. Discussed psychosocial stressors in detail.      Medications: Continue Abilify and increase to 15mg  daily for mood augmentation and tics  Continue Wellbutrin XR 300mg  for depression  Continue Klonopin 0.5mg  po BID and 0.25 po qDinner prn anxiety Due to untreated sleep apnea pt advised not to use sedating meds at bedtime such as Clonazepam and pain meds.   Routine PRN Medications: Yes Clonazepam   Consultations: refer to therapist  Safety Concerns: Pt denies SI and is at an acute low risk for suicide.Patient told to call clinic if any problems occur. Patient advised to go to ER if they should develop SI/HI, side effects, or if symptoms worsen. Has crisis numbers to call if needed. Pt verbalized understanding.   Other: F/up in 2 months or sooner if needed     Charlcie Cradle, MD 07/11/2014

## 2014-08-01 ENCOUNTER — Telehealth (HOSPITAL_COMMUNITY): Payer: Self-pay | Admitting: *Deleted

## 2014-08-01 NOTE — Telephone Encounter (Signed)
Pt states she was wondering if increase in Abilify would cause moody crying spells. Pt has had 2 different episodes where she felt down and was crying. States she wonders if it was the holidays.   Advised to continue meds and monitor symptoms.

## 2014-08-08 ENCOUNTER — Other Ambulatory Visit (HOSPITAL_COMMUNITY): Payer: Self-pay | Admitting: Psychiatry

## 2014-08-30 ENCOUNTER — Other Ambulatory Visit (HOSPITAL_COMMUNITY): Payer: Self-pay | Admitting: Psychiatry

## 2014-09-10 ENCOUNTER — Other Ambulatory Visit (HOSPITAL_COMMUNITY): Payer: Self-pay | Admitting: Psychiatry

## 2014-09-12 ENCOUNTER — Ambulatory Visit (INDEPENDENT_AMBULATORY_CARE_PROVIDER_SITE_OTHER): Payer: PRIVATE HEALTH INSURANCE | Admitting: Psychiatry

## 2014-09-12 ENCOUNTER — Encounter (HOSPITAL_COMMUNITY): Payer: Self-pay | Admitting: Psychiatry

## 2014-09-12 VITALS — BP 149/94 | HR 107 | Ht 69.0 in | Wt 258.0 lb

## 2014-09-12 DIAGNOSIS — F331 Major depressive disorder, recurrent, moderate: Secondary | ICD-10-CM

## 2014-09-12 DIAGNOSIS — F332 Major depressive disorder, recurrent severe without psychotic features: Secondary | ICD-10-CM

## 2014-09-12 DIAGNOSIS — F952 Tourette's disorder: Secondary | ICD-10-CM

## 2014-09-12 MED ORDER — CLONAZEPAM 0.5 MG PO TABS: ORAL_TABLET | ORAL | Status: DC

## 2014-09-12 MED ORDER — ARIPIPRAZOLE 15 MG PO TABS: 15.0000 mg | ORAL_TABLET | Freq: Every day | ORAL | Status: DC

## 2014-09-12 MED ORDER — BUPROPION HCL ER (XL) 150 MG PO TB24: 450.0000 mg | ORAL_TABLET | Freq: Every day | ORAL | Status: DC

## 2014-09-12 NOTE — Progress Notes (Signed)
[facility] [phone] Progress Note  Penny Porter [id number] 49 y.o.  09/12/2014 2:37 PM  Chief Complaint: "ok overall"  History of Present Illness: Pt had 3-4 bad episodes of depression last occurring 2 weeks ago. Over last 2 weeks she and family have been sick with bronchitis.  States she is having low motivation and anhedonia. Reports on/off sad mood. Pt has crying spells but they are less frequent. Pt denies isolation and is usually with family. Pt feels worthless due to pain and inability to do the things she wants.   Anxiety is the same. Anxiety comes and goes thru out the week. She has racing thoughts. No panic attacks since last visit. Driving and being in crowds cause anxiety and panic attacks.   Pain continues unchanged. She is having a lot of hip and back pain. It limits her ability to do things. She has been diagnosed with Costochondralis and trigeminal neuralgia. Pt was on Prednisone to help with the trigeminal neuralgia pain. Pain doesn't allow her to sleep for more than 3-5 hrs. Pt is not using her CPAP.  Appetite is variable and a lot of things she eats makes her feel sick. Pt is eating small meals and still has issues with N/V due to gasteroparesis.  Energy remains generally low.   Tics come and go depending on her anxiety and stress level. Vocal grunts, stomach jerking, eye squinting and arm jerking are all possible reactions. Pt is taking Klonopin TID and some days it doesn't seem to help with the stomach jerks. States stomach jerking never really goes away.   Pt is taking Wellbutrin, Klonopin and Abilify as prescribed and denies SE. States meds are helping.   Suicidal Ideation: No Plan Formed: No Patient has means to carry out plan: No  Homicidal Ideation: No Plan Formed: No Patient has means to carry out plan: No  Review of Systems: Psychiatric: Agitation: No Hallucination: No Depressed Mood: Yes Insomnia: Yes Hypersomnia: No Altered Concentration:  No Feels Worthless: Yes Grandiose Ideas: No Belief In Special Powers: No New/Increased Substance Abuse: No Compulsions: No  Neurologic: Headache: No Seizure: No Paresthesias: No   Review of Systems  Constitutional: Negative for fever, chills and malaise/fatigue.  HENT: Positive for congestion and sore throat.   Eyes: Negative for blurred vision, double vision and redness.  Respiratory: Positive for cough, sputum production and shortness of breath. Negative for wheezing.   Cardiovascular: Positive for chest pain. Negative for palpitations and leg swelling.  Gastrointestinal: Positive for heartburn, abdominal pain and diarrhea. Negative for nausea, vomiting and constipation.  Musculoskeletal: Positive for back pain and joint pain. Negative for neck pain.  Skin: Negative for itching and rash.  Neurological: Positive for weakness. Negative for dizziness, tingling, seizures and headaches.  Psychiatric/Behavioral: Positive for depression. Negative for suicidal ideas, hallucinations and substance abuse. The patient is nervous/anxious and has insomnia.      Past Medical Family, Social History: Pt lives in [place] with her mom and 2 sisters. Pt is single and does not have children.  Pt denies hx of or current use of cigs, alcohol and illicit drugs.  Family History  Problem Relation Age of Onset  . Mental retardation Sister   . Dementia Father   . Suicidality Neg Hx    Past Medical History  Diagnosis Date  . Asthma   . Depression   . GERD (gastroesophageal reflux disease)   . Hypertension   . Tourette disease   . Gastroparesis   . Trigeminal nerve disease   .  Costochondral chest pain   . Arthritis     Back and hip  . Stenosis of lumbosacral spine   . Degenerative disk disease   . Bursitis   . Thyroid nodule   . Sleep apnea     not using CPAP   Outpatient Encounter Prescriptions as of 09/12/2014  Medication Sig  . albuterol (PROVENTIL HFA;VENTOLIN HFA) 108 (90 BASE) MCG/ACT  inhaler Inhale into the lungs every 6 (six) hours as needed for wheezing or shortness of breath.  . [place], Misc. (PROMISEB EX) Apply topically.  . ARIPiprazole (ABILIFY) 15 MG tablet Take 1 tablet (15 mg total) by mouth daily.  Marland Kitchen aspirin 81 MG tablet Take 81 mg by mouth daily.  Marland Kitchen azelastine (ASTELIN) 0.1 % nasal spray Place 2 sprays into both nostrils 2 (two) times daily. Use in each nostril as directed  . beclomethasone (QVAR) 80 MCG/ACT inhaler Inhale into the lungs 2 (two) times daily.  Marland Kitchen buPROPion (WELLBUTRIN XL) 300 MG 24 hr tablet Take 1 tablet (300 mg total) by mouth daily.  Marland Kitchen buPROPion (WELLBUTRIN XL) 300 MG 24 hr tablet Take 1 tablet (300 mg total) by mouth daily.  . Cholecalciferol (VITAMIN D-3) 5000 UNITS TABS Take 1 tablet by mouth daily.  . clonazePAM (KLONOPIN) 0.5 MG tablet Take 1 tab po qAM, 1 tab po qLunch and 1/2 tab po qbedtime  . cyclobenzaprine (FLEXERIL) 10 MG tablet Take 10 mg by mouth. Take 5-10mg  po TID as needed  . diclofenac (FLECTOR) 1.3 % PTCH Place 1 patch onto the skin 2 (two) times daily.  . Diclofenac (ZORVOLEX) 18 MG CAPS Take 18 mg by mouth daily.  . diclofenac sodium (VOLTAREN) 1 % GEL Apply 1 g topically 2 (two) times daily.  Marland Kitchen dicyclomine (BENTYL) 10 MG capsule Take 10 mg by mouth 3 (three) times daily before meals.  . felodipine (PLENDIL) 5 MG 24 hr tablet Take 7.5 mg by mouth daily.   . fexofenadine (ALLEGRA) 180 MG tablet Take 180 mg by mouth daily.  . furosemide (LASIX) 20 MG tablet 20 mg. Take 1-2 tabs po up to twice a day as needed for edema  . HYDROcodone-acetaminophen (NORCO) 7.5-325 MG per tablet Take 1 tablet by mouth every 6 (six) hours as needed for moderate pain.  Marland Kitchen ketoconazole (NIZORAL) 2 % cream Apply 1 application topically daily.  Marland Kitchen ketoconazole (NIZORAL) 2 % shampoo Apply 1 application topically 2 (two) times a week.  . Linaclotide (LINZESS) 290 MCG CAPS capsule Take 290 mcg by mouth daily.  Marland Kitchen lubiprostone (AMITIZA) 24  MCG capsule Take 24 mcg by mouth 2 (two) times daily with a meal.  . medroxyPROGESTERone (PROVERA) 10 MG tablet Take 10 mg by mouth daily.  . metroNIDAZOLE (METROCREAM) 0.75 % cream Apply topically 2 (two) times daily.  . mometasone (NASONEX) 50 MCG/ACT nasal spray Place 2 sprays into the nose daily.  Marland Kitchen olmesartan (BENICAR) 20 MG tablet Take 20 mg by mouth daily.  . ondansetron (ZOFRAN-ODT) 4 MG disintegrating tablet Take 4 mg by mouth 2 (two) times daily as needed for nausea or vomiting.  . predniSONE (STERAPRED UNI-PAK) 10 MG tablet Take by mouth daily. taper  . pregabalin (LYRICA) 50 MG capsule Take 50 mg by mouth. Take 1 tab po qAM and 2 tabs qPM  . RABEprazole (ACIPHEX) 20 MG tablet Take 20 mg by mouth daily.  . ranitidine (ZANTAC) 300 MG tablet Take 300 mg by mouth at bedtime.  Marland Kitchen spironolactone (ALDACTONE) 25 MG tablet Take 25 mg  by mouth daily.  . traMADol (ULTRAM) 50 MG tablet Take by mouth. 1-2 tabs po q6hr as needed for pain  . triamcinolone cream (KENALOG) 0.1 % Apply 1 application topically 2 (two) times daily.  . vitamin B-12 (CYANOCOBALAMIN) 1000 MCG tablet Take 1,000 mcg by mouth daily.  . vitamin E 400 UNIT capsule Take 400 Units by mouth daily.    Past Psychiatric History/Hospitalization(s): Anxiety: No Bipolar Disorder: No Depression: Yes Mania: No Psychosis: No Schizophrenia: No Personality Disorder: No Hospitalization for psychiatric illness: No History of Electroconvulsive Shock Therapy: No Prior Suicide Attempts: No  Physical Exam: Constitutional:  BP 149/94 mmHg  Pulse 107  Ht 5\' 9"  (1.753 m)  Wt 258 lb (117.028 kg)  BMI 38.08 kg/m2  General Appearance: alert, oriented, no acute distress and obese  Musculoskeletal: Strength & Muscle Tone: within normal limits Gait & Station: moving slowly and walking with cane due to back problems Patient leans: N/A  Mental Status Examination/Evaluation: Objective: Attitude: Calm and cooperative  Appearance:  Casual, appears to be stated age  Eye Contact::  Good  Speech:  Clear and Coherent and Normal Rate  Volume:  Normal  Mood:  depressed  Affect:  Congruent  Thought Process:  Circumstantial  Orientation:  Full (Time, Place, and Person)  Thought Content:  Negative  Suicidal Thoughts:  No  Homicidal Thoughts:  No  Judgement:  Fair  Insight:  Fair  Concentration: good  Memory: Immediate-fair Recent-fair Remote-fair  Recall: fair  Language: fair  Gait and Station: normal  [employer] of Knowledge: average  Psychomotor Activity:  Normal walking slow with a cane  Akathisia:  No  Handed:  Right  AIMS (if indicated):  Facial and Oral Movements  Muscles of Facial Expression: None, normal  Lips and Perioral Area: None, normal  Jaw: None, normal  Tongue: None, normal Extremity Movements: Upper (arms, wrists, hands, fingers): None, normal  Lower (legs, knees, ankles, toes): None, normal,  Trunk Movements:  Neck, shoulders, hips: None, normal,  Overall Severity : Severity of abnormal movements (highest score from questions above): None, normal  Incapacitation due to abnormal movements: None, normal  Patient's awareness of abnormal movements (rate only patient's report): No Awareness, Dental Status  Current problems with teeth and/or dentures?: No  Does patient usually wear dentures?: No    Assets:  Communication Skills Desire for Improvement Housing Administrator, Civil Service (Choose Three): Review of Psycho-Social Stressors (1), Established Problem, Worsening (2), Review of Medication Regimen & Side Effects (2) and Review of New Medication or Change in Dosage (2)   Assessment: AXIS I : Major Depression, Recurrent severe without psychotic features and Tourettes  AXIS II : Deferred  AXIS III  Past Medical History  Diagnosis Date  . Asthma   . Depression   . GERD (gastroesophageal reflux disease)   . Hypertension   . Tourette disease   .  Gastroparesis   . Trigeminal nerve disease   . Costochondral chest pain   . Arthritis     Back and hip  . Stenosis of lumbosacral spine   . Degenerative disk disease   . Bursitis   . Thyroid nodule   . Sleep apnea     not using CPAP   AXIS IV : occupational problems, other psychosocial or environmental problems and problems with access to health care services  AXIS V : 51-60 moderate symptoms      Treatment Plan/Recommendations:  Plan of Care:  Medication management with  supportive therapy. Risks/benefits and SE of the medication discussed. Pt verbalized understanding and verbal consent obtained for treatment.  Affirm with the patient that the medications are taken as ordered. Patient expressed understanding of how their medications were to be used.  - worsening of symptoms   Laboratory: reviewed EKG 01/27/2014 QTc 406, NSR  Psychotherapy: Therapy: brief supportive therapy provided. Discussed psychosocial stressors in detail.      Medications: Continue Abilify 15mg  daily for mood augmentation and tics  Increase Wellbutrin XR to 450mg  for depression  Continue Klonopin 0.5mg  po BID and 0.25 po qDinner prn anxiety Due to untreated sleep apnea pt advised not to use sedating meds at bedtime such as Clonazepam and pain meds.   Routine PRN Medications: Yes Clonazepam   Consultations: upcoming appt with  therapist  Safety Concerns: Pt denies SI and is at an acute low risk for suicide.Patient told to call clinic if any problems occur. Patient advised to go to ER if they should develop SI/HI, side effects, or if symptoms worsen. Has crisis numbers to call if needed. Pt verbalized understanding.   Other: F/up in 2 months or sooner if needed     Charlcie Cradle, MD 09/12/2014

## 2014-09-15 ENCOUNTER — Telehealth (HOSPITAL_COMMUNITY): Payer: Self-pay

## 2014-09-15 NOTE — Telephone Encounter (Signed)
Telephone call to patient to inform her [employer] long-term disability forms were complete and ready for patient to pick up.  Patient will pick up forms this evening.

## 2014-09-19 ENCOUNTER — Telehealth (HOSPITAL_COMMUNITY): Payer: Self-pay | Admitting: *Deleted

## 2014-09-19 DIAGNOSIS — F331 Major depressive disorder, recurrent, moderate: Secondary | ICD-10-CM

## 2014-09-19 MED ORDER — BUPROPION HCL ER (XL) 450 MG PO TB24: 450.0000 mg | ORAL_TABLET | Freq: Every day | ORAL | Status: DC

## 2014-09-19 NOTE — Telephone Encounter (Signed)
Medication resent

## 2014-09-19 NOTE — Telephone Encounter (Signed)
Dr Doyne Keel,   Patient's insurance will not cover patient taking Bupropion 150 mg, three tablets by mouth. Patient's insurance states it exceeds the plan's maximum daily dose of one tablet per day. Can Bupropion 450 mg be sent instead?  Thank you

## 2014-09-19 NOTE — Telephone Encounter (Signed)
Yes 450mg  tab is fine

## 2014-09-20 ENCOUNTER — Telehealth (HOSPITAL_COMMUNITY): Payer: Self-pay | Admitting: *Deleted

## 2014-09-20 DIAGNOSIS — F331 Major depressive disorder, recurrent, moderate: Secondary | ICD-10-CM

## 2014-09-20 MED ORDER — BUPROPION HCL ER (XL) 300 MG PO TB24: 300.0000 mg | ORAL_TABLET | ORAL | Status: DC

## 2014-09-20 MED ORDER — BUPROPION HCL ER (XL) 150 MG PO TB24: 150.0000 mg | ORAL_TABLET | ORAL | Status: DC

## 2014-09-20 NOTE — Telephone Encounter (Signed)
[facility] left message on nurse's voice mail. Called [facility] back [facility] and I spoke with Aldona Bar she requested that Wellbutrin be sent as 300 mg and a 150 mg, then that should be covered by insurance plan. Talked with Emeterio Reeve., RN he cleared the change.  Patient notified of medication change.. I advised patient she may end up with two co pays because we are sending two RX's Patient verbalized understanding

## 2014-09-21 NOTE — Telephone Encounter (Signed)
Telephone call with patient to verify her correct increased dosage of Wellbutrin XL 150mg  1 by mouth every morning and 300mg , 1 by mouth every morning for a total dosage of 450mg  each day.  Patient agreed with plan and stated understanding with no other concerns.  Patient to call if any further problems.

## 2014-09-22 ENCOUNTER — Telehealth (HOSPITAL_COMMUNITY): Payer: Self-pay

## 2014-09-22 NOTE — Telephone Encounter (Signed)
Telephone call with patient to clarify correct dosage of Wellbutrin XL as Dr. Doyne Keel approved her increase to 450mg  total.  Patient reported being confused by a message left by [facility] but now states she is clear about instructions and okay with taking one of each dosage, a 300mg  and 150mg  tablet each day to equal 450mg .  Patient stated understanding [facility] could not provide a 450mg  individual daily tablet.  Patient reported no current symptoms and no other concerns today.  Will call if any arise.

## 2014-09-27 ENCOUNTER — Telehealth (HOSPITAL_COMMUNITY): Payer: Self-pay | Admitting: *Deleted

## 2014-09-27 NOTE — Telephone Encounter (Signed)
Patient was calling to see if her RX was mailed out. I called [facility] and spoke with Megan--pharmacy tech. Per Jinny Blossom the medication for Wellbutrin 150 mg was shipped 09-26-14. Per Jinny Blossom patient will need Wellbutrin 300 mg will not be mailed until 11-06-14 because patient should already have enough of 300 mg to last.  Called patient back advised patient what [facility] stated. Patient stated she does have enough of the 300 mg Wellbutrin. I advised patient when the Wellbutrin 150 mg comes to had that to the 300 mg to equal 450 mg as directed by Dr. Doyne Keel. Patient verbalized understanding.

## 2014-10-12 ENCOUNTER — Telehealth (HOSPITAL_COMMUNITY): Payer: Self-pay | Admitting: *Deleted

## 2014-10-12 DIAGNOSIS — F952 Tourette's disorder: Secondary | ICD-10-CM

## 2014-10-12 DIAGNOSIS — F332 Major depressive disorder, recurrent severe without psychotic features: Secondary | ICD-10-CM

## 2014-10-12 MED ORDER — ARIPIPRAZOLE 15 MG PO TABS: 15.0000 mg | ORAL_TABLET | Freq: Every day | ORAL | Status: DC

## 2014-10-12 NOTE — Telephone Encounter (Signed)
Yes 90 day supply is fine

## 2014-10-12 NOTE — Telephone Encounter (Signed)
Dr. Doyne Keel,   Received via fax from [facility] request for patient's Abilify to be switched to a 90 day supply. Is it okay to switch?  Thank you

## 2014-10-18 ENCOUNTER — Telehealth (HOSPITAL_COMMUNITY): Payer: Self-pay | Admitting: *Deleted

## 2014-10-18 DIAGNOSIS — F411 Generalized anxiety disorder: Secondary | ICD-10-CM

## 2014-10-18 NOTE — Telephone Encounter (Signed)
Dr. Doyne Keel,   Patient called and stated that she went to her pain management doctor today and had a question.  Patient stated that she was told that she cannot take Klonopin with Norco, patient stated Tramadol is not working for her anymore, has to be able to take pain medication.  Patient stated the pain management doctor told her it can cause respiratory failure. Patient stated she would like you to call her back, needs to discuss what medication she can take with her pain medication in place of Klonopin. I advised patient that you will be out of the office today back tomorrow.  Patient stated she will wait for your call tomorrow, has an appointment tomorrow at 10 and 2:30 but please contact her on her cell phone.   Thank you

## 2014-10-19 MED ORDER — HYDROXYZINE HCL 10 MG PO TABS: 10.0000 mg | ORAL_TABLET | Freq: Three times a day (TID) | ORAL | Status: DC | PRN

## 2014-10-19 NOTE — Telephone Encounter (Signed)
Pt is concerned b/c she was told by her pain doctor that [place] and Klonopin could cause respiratory depression. Pt is not sure if she wants to get off the Klonopin. States stomach jerks began in HS and have never gone away. Abilify has not helped and she does not want to get back on Orap.   A/P: MDD, Tourettes 1. D/c Klonopin- taper 1mg  for 4 days then 0.5mg  for 4 days then d/c  2. Start trial of Vistaril 10mg  po TID prn anxiety

## 2014-10-19 NOTE — Addendum Note (Signed)
Addended by: Charlcie Cradle on: 10/19/2014 03:23 PM   Modules accepted: Orders, Medications

## 2014-10-19 NOTE — Telephone Encounter (Signed)
Called and left voice message for call back to clinic.

## 2014-10-20 ENCOUNTER — Telehealth (HOSPITAL_COMMUNITY): Payer: Self-pay | Admitting: *Deleted

## 2014-10-20 NOTE — Telephone Encounter (Signed)
Dr. Doyne Keel,   Patient stated that she needs you to call her back.  Patient stated that the way she was told to taper the Klonopin yesterday is not working.  Patient stated that she was up at 4 am and could not go back to sleep. Patient stated that she spoke with the pharmacist and they suggested she take half Klonopin at lunch and half at bedtime, instead of a whole tablet at lunch and nothing at bedtime. Patient stated she wanted to make you aware that she was going to try taking it like that because she cannot handle being up at 4 am and having to "occupy her time and mind". Patient states she needs to be tapered off slowly.  Patient states when you are in the office on Tuesday to please give her a call back. Patient states at this time no thoughts of hurting herself or anyone else, just feels tired due to having been up since 4 am.   Thank you

## 2014-10-24 ENCOUNTER — Telehealth (HOSPITAL_COMMUNITY): Payer: Self-pay | Admitting: *Deleted

## 2014-10-24 NOTE — Telephone Encounter (Signed)
It is fine for pt to take Klonopin 1/2 tab at lunch and then 1/2 tab at bedtime. If we need to extend the taper we can do 7 days total dose 1mg  then 7 days total dose 0.5mg  then stop.

## 2014-10-24 NOTE — Telephone Encounter (Signed)
LMOM for patient to call back.  Need to make sure she received Dr. Havery Moros message.

## 2014-10-24 NOTE — Telephone Encounter (Signed)
Called and left voice message for pt to call back clinic.

## 2014-10-31 ENCOUNTER — Other Ambulatory Visit (HOSPITAL_COMMUNITY): Payer: Self-pay | Admitting: Psychiatry

## 2014-11-14 ENCOUNTER — Encounter (HOSPITAL_COMMUNITY): Payer: Self-pay | Admitting: Psychiatry

## 2014-11-14 ENCOUNTER — Ambulatory Visit (INDEPENDENT_AMBULATORY_CARE_PROVIDER_SITE_OTHER): Payer: PRIVATE HEALTH INSURANCE | Admitting: Psychiatry

## 2014-11-14 VITALS — BP 134/89 | HR 86 | Ht 69.0 in | Wt 251.4 lb

## 2014-11-14 DIAGNOSIS — F952 Tourette's disorder: Secondary | ICD-10-CM | POA: Insufficient documentation

## 2014-11-14 DIAGNOSIS — F332 Major depressive disorder, recurrent severe without psychotic features: Secondary | ICD-10-CM | POA: Diagnosis not present

## 2014-11-14 DIAGNOSIS — F331 Major depressive disorder, recurrent, moderate: Secondary | ICD-10-CM

## 2014-11-14 MED ORDER — BUPROPION HCL ER (XL) 300 MG PO TB24: 300.0000 mg | ORAL_TABLET | ORAL | Status: DC

## 2014-11-14 MED ORDER — BUPROPION HCL ER (XL) 150 MG PO TB24: 150.0000 mg | ORAL_TABLET | ORAL | Status: DC

## 2014-11-14 MED ORDER — ARIPIPRAZOLE 20 MG PO TABS: 20.0000 mg | ORAL_TABLET | Freq: Every day | ORAL | Status: DC

## 2014-11-14 NOTE — Progress Notes (Signed)
Patient ID: Penny Porter, female   DOB: 1965-09-14, 49 y.o.   MRN: [id number]  [place] [address] Progress Note  Penny Porter [id number] 48 y.o.  11/14/2014 2:05 PM  Chief Complaint: "coming off Klonopin I felt really nervous"  History of Present Illness: Pt reports multiple stressors including poor health and finances.   States she is having low motivation and anhedonia but has been trying out a few new activities at the recommendation of her therapist (listening to radio, reading, coloring). Reports on/off sad mood. Pt has crying spells but they are even less frequent than before. Pt denies isolation and is usually with family. Pt feels worthless due to pain and inability to do the things she wants.   Pain continues unchanged. She is having a lot of hip and back pain and arm pain.  It limits her ability to do things and makes her feel depressed.  She has been diagnosed with Costochondralis and trigeminal neuralgia. Pain doesn't allow her to sleep well. Sleep is variable and some nights she sleeps 3-5 hrs or longer. Pt is not using her CPAP.  Appetite is variable and a lot of things she eats makes her feel sick. Pt is eating small meals and still has issues with N/V due to gasteroparesis.  Energy remains generally low.   Anxiety comes and goes thru out the week. She has racing thoughts. No panic attacks since last visit. Driving and being in crowds cause anxiety and panic attacks. Pt got off Klonopin about 1 week ago and had periods of severe anxiety with GI upset.  Tics come and go depending on her anxiety and stress level. Vocal grunts, stomach jerking, eye squinting and arm jerking are all possible reactions. States stomach jerking never really goes away but seems to be going on all day since stopping Klonopin. She also had arm jerks last week for unknown reasons.   Pt is taking Wellbutrin and Abilify as prescribed and denies SE. States meds are helping.   Therapy is going well  and she is trying hard to utilize the coping skills she has learned.   Suicidal Ideation: No Plan Formed: No Patient has means to carry out plan: No  Homicidal Ideation: No Plan Formed: No Patient has means to carry out plan: No  Review of Systems: Psychiatric: Agitation: No Hallucination: No Depressed Mood: Yes Insomnia: Yes Hypersomnia: No Altered Concentration: No Feels Worthless: Yes Grandiose Ideas: No Belief In Special Powers: No New/Increased Substance Abuse: No Compulsions: No  Neurologic: Headache: No Seizure: No Paresthesias: No   Review of Systems  Constitutional: Negative for fever, chills and weight loss.  HENT: Negative for congestion, nosebleeds and sore throat.   Eyes: Negative for blurred vision, double vision and pain.  Respiratory: Negative for cough, shortness of breath and wheezing.   Cardiovascular: Negative for chest pain, palpitations and leg swelling.  Gastrointestinal: Positive for nausea, vomiting and abdominal pain. Negative for heartburn.  Musculoskeletal: Positive for back pain, joint pain and neck pain.  Skin: Negative for itching and rash.  Neurological: Negative for dizziness, sensory change, seizures, loss of consciousness and headaches.  Psychiatric/Behavioral: Positive for depression. Negative for suicidal ideas, hallucinations and substance abuse. The patient is nervous/anxious and has insomnia.      Past Medical Family, Social History: Pt lives in [place] with her mom and 2 sisters. Pt is single and does not have children.  Pt denies hx of or current use of cigs, alcohol and illicit drugs.  Family History  Problem Relation Age of Onset  . Mental retardation Sister   . Dementia Father   . Suicidality Neg Hx    Past Medical History  Diagnosis Date  . Asthma   . Depression   . GERD (gastroesophageal reflux disease)   . Hypertension   . Tourette disease   . Gastroparesis   . Trigeminal nerve disease   . Costochondral chest pain    . Arthritis     Back and hip  . Stenosis of lumbosacral spine   . Degenerative disk disease   . Bursitis   . Thyroid nodule   . Sleep apnea     not using CPAP   Outpatient Encounter Prescriptions as of 11/14/2014  Medication Sig  . albuterol (PROVENTIL HFA;VENTOLIN HFA) 108 (90 BASE) MCG/ACT inhaler Inhale into the lungs every 6 (six) hours as needed for wheezing or shortness of breath.  . [place], Misc. (PROMISEB EX) Apply topically.  . ARIPiprazole (ABILIFY) 20 MG tablet Take 1 tablet (20 mg total) by mouth daily.  Marland Kitchen aspirin 81 MG tablet Take 81 mg by mouth daily.  Marland Kitchen azelastine (ASTELIN) 0.1 % nasal spray Place 2 sprays into both nostrils 2 (two) times daily. Use in each nostril as directed  . beclomethasone (QVAR) 80 MCG/ACT inhaler Inhale into the lungs 2 (two) times daily.  Marland Kitchen buPROPion (WELLBUTRIN XL) 150 MG 24 hr tablet Take 1 tablet (150 mg total) by mouth every morning.  Marland Kitchen buPROPion (WELLBUTRIN XL) 300 MG 24 hr tablet Take 1 tablet (300 mg total) by mouth every morning.  . Cholecalciferol (VITAMIN D-3) 5000 UNITS TABS Take 1 tablet by mouth daily.  . cyclobenzaprine (FLEXERIL) 10 MG tablet Take 10 mg by mouth. Take 5-10mg  po TID as needed  . diclofenac (FLECTOR) 1.3 % PTCH Place 1 patch onto the skin 2 (two) times daily.  . Diclofenac (ZORVOLEX) 18 MG CAPS Take 18 mg by mouth daily.  . diclofenac sodium (VOLTAREN) 1 % GEL Apply 1 g topically 2 (two) times daily.  Marland Kitchen dicyclomine (BENTYL) 10 MG capsule Take 10 mg by mouth 3 (three) times daily before meals.  . felodipine (PLENDIL) 5 MG 24 hr tablet Take 7.5 mg by mouth daily.   . fexofenadine (ALLEGRA) 180 MG tablet Take 180 mg by mouth daily.  . furosemide (LASIX) 20 MG tablet 20 mg. Take 1-2 tabs po up to twice a day as needed for edema  . HYDROcodone-acetaminophen (NORCO) 7.5-325 MG per tablet Take 1 tablet by mouth every 6 (six) hours as needed for moderate pain.  . hydrOXYzine (ATARAX/VISTARIL) 10 MG tablet  Take 1 tablet (10 mg total) by mouth 3 (three) times daily as needed for anxiety.  Marland Kitchen ketoconazole (NIZORAL) 2 % cream Apply 1 application topically daily.  Marland Kitchen ketoconazole (NIZORAL) 2 % shampoo Apply 1 application topically 2 (two) times a week.  . Linaclotide (LINZESS) 290 MCG CAPS capsule Take 290 mcg by mouth daily.  Marland Kitchen lubiprostone (AMITIZA) 24 MCG capsule Take 24 mcg by mouth 2 (two) times daily with a meal.  . medroxyPROGESTERone (PROVERA) 10 MG tablet Take 10 mg by mouth daily.  . metroNIDAZOLE (METROCREAM) 0.75 % cream Apply topically 2 (two) times daily.  . mometasone (NASONEX) 50 MCG/ACT nasal spray Place 2 sprays into the nose daily.  Marland Kitchen olmesartan (BENICAR) 20 MG tablet Take 20 mg by mouth daily.  . ondansetron (ZOFRAN-ODT) 4 MG disintegrating tablet Take 4 mg by mouth 2 (two) times daily as needed for nausea or vomiting.  Marland Kitchen  pregabalin (LYRICA) 50 MG capsule Take 50 mg by mouth. Take 1 tab po qAM and 2 tabs qPM  . RABEprazole (ACIPHEX) 20 MG tablet Take 20 mg by mouth daily.  . ranitidine (ZANTAC) 300 MG tablet Take 300 mg by mouth at bedtime.  Marland Kitchen spironolactone (ALDACTONE) 25 MG tablet Take 25 mg by mouth daily.  . traMADol (ULTRAM) 50 MG tablet Take by mouth. 1-2 tabs po q6hr as needed for pain  . triamcinolone cream (KENALOG) 0.1 % Apply 1 application topically 2 (two) times daily.  . vitamin B-12 (CYANOCOBALAMIN) 1000 MCG tablet Take 1,000 mcg by mouth daily.  . vitamin E 400 UNIT capsule Take 400 Units by mouth daily.  . [DISCONTINUED] ARIPiprazole (ABILIFY) 15 MG tablet Take 1 tablet (15 mg total) by mouth daily.  . [DISCONTINUED] ARIPiprazole (ABILIFY) 20 MG tablet Take 1 tablet (20 mg total) by mouth daily.  . [DISCONTINUED] buPROPion (WELLBUTRIN XL) 150 MG 24 hr tablet Take 1 tablet (150 mg total) by mouth every morning.  . [DISCONTINUED] buPROPion (WELLBUTRIN XL) 150 MG 24 hr tablet Take 1 tablet (150 mg total) by mouth every morning.  . [DISCONTINUED] buPROPion (WELLBUTRIN  XL) 300 MG 24 hr tablet Take 1 tablet (300 mg total) by mouth every morning.  . [DISCONTINUED] buPROPion (WELLBUTRIN XL) 300 MG 24 hr tablet Take 1 tablet (300 mg total) by mouth every morning.  . predniSONE (STERAPRED UNI-PAK) 10 MG tablet Take by mouth daily. taper    Past Psychiatric History/Hospitalization(s): Anxiety: No Bipolar Disorder: No Depression: Yes Mania: No Psychosis: No Schizophrenia: No Personality Disorder: No Hospitalization for psychiatric illness: No History of Electroconvulsive Shock Therapy: No Prior Suicide Attempts: No  Physical Exam: Constitutional:  BP 134/89 mmHg  Pulse 86  Ht 5\' 9"  (1.753 m)  Wt 251 lb 6.4 oz (114.034 kg)  BMI 37.11 kg/m2  General Appearance: alert, oriented, no acute distress and obese  Musculoskeletal: Strength & Muscle Tone: within normal limits Gait & Station: moving slowly and walking with cane due to back problems Patient leans: N/A  Mental Status Examination/Evaluation: Objective: Attitude: Calm and cooperative  Appearance: Casual, appears to be stated age  Eye Contact::  Good  Speech:  Clear and Coherent and Normal Rate  Volume:  Normal  Mood:  depressed  Affect:  Congruent  Thought Process:  Goal Directed  Orientation:  Full (Time, Place, and Person)  Thought Content:  Negative  Suicidal Thoughts:  No  Homicidal Thoughts:  No  Judgement:  Fair  Insight:  Fair  Concentration: good  Memory: Immediate-fair Recent-fair Remote-fair  Recall: fair  Language: fair  Gait and Station: normal  [employer] of Knowledge: average  Psychomotor Activity:  Normal walking slow with a cane  Akathisia:  No  Handed:  Right  AIMS (if indicated):  Facial and Oral Movements  Muscles of Facial Expression: None, normal  Lips and Perioral Area: None, normal  Jaw: None, normal  Tongue: None, normal Extremity Movements: Upper (arms, wrists, hands, fingers): None, normal  Lower (legs, knees, ankles, toes): None, normal,   Trunk Movements:  Neck, shoulders, hips: None, normal,  Overall Severity : Severity of abnormal movements (highest score from questions above): None, normal  Incapacitation due to abnormal movements: None, normal  Patient's awareness of abnormal movements (rate only patient's report): No Awareness, Dental Status  Current problems with teeth and/or dentures?: No  Does patient usually wear dentures?: No    Assets:  Communication Skills Desire for Improvement Housing Herbalist  Medical Decision Making (Choose Three): Established Problem, Stable/Improving (1), Review of Psycho-Social Stressors (1), Established Problem, Worsening (2), Review of Medication Regimen & Side Effects (2) and Review of New Medication or Change in Dosage (2)   Assessment: AXIS I : Major Depression, Recurrent severe without psychotic features and Tourettes  AXIS II : Deferred  AXIS III  Past Medical History  Diagnosis Date  . Asthma   . Depression   . GERD (gastroesophageal reflux disease)   . Hypertension   . Tourette disease   . Gastroparesis   . Trigeminal nerve disease   . Costochondral chest pain   . Arthritis     Back and hip  . Stenosis of lumbosacral spine   . Degenerative disk disease   . Bursitis   . Thyroid nodule   . Sleep apnea     not using CPAP   AXIS IV : occupational problems, other psychosocial or environmental problems and problems with access to health care services  AXIS V : 51-60 moderate symptoms      Treatment Plan/Recommendations:  Plan of Care:  Medication management with supportive therapy. Risks/benefits and SE of the medication discussed. Pt verbalized understanding and verbal consent obtained for treatment.  Affirm with the patient that the medications are taken as ordered. Patient expressed understanding of how their medications were to be used.  - worsening of tics and anxiety symptoms, depression is stable   Laboratory: reviewed EKG  01/27/2014 QTc 406, NSR; order labs and EKG at next appt  Psychotherapy: Therapy: brief supportive therapy provided. Discussed psychosocial stressors in detail.      Medications: Increase Abilify to 20mg  daily for mood augmentation and tics  Wellbutrin XR to 450mg  for depression  D/c Klonopin  Due to untreated sleep apnea pt advised not to use sedating meds at bedtime such as Clonazepam and pain meds.   Routine PRN Medications: No  Consultations: encouraged to continue appts with therapist  Safety Concerns: Pt denies SI and is at an acute low risk for suicide.Patient told to call clinic if any problems occur. Patient advised to go to ER if they should develop SI/HI, side effects, or if symptoms worsen. Has crisis numbers to call if needed. Pt verbalized understanding.   Other: F/up in 2 months or sooner if needed     Charlcie Cradle, MD 11/14/2014

## 2014-12-27 ENCOUNTER — Other Ambulatory Visit (HOSPITAL_COMMUNITY): Payer: Self-pay | Admitting: Psychiatry

## 2014-12-29 ENCOUNTER — Telehealth (HOSPITAL_COMMUNITY): Payer: Self-pay

## 2014-12-29 ENCOUNTER — Telehealth (HOSPITAL_COMMUNITY): Payer: Self-pay | Admitting: *Deleted

## 2014-12-29 DIAGNOSIS — F411 Generalized anxiety disorder: Secondary | ICD-10-CM

## 2014-12-29 NOTE — Telephone Encounter (Signed)
Dr. Doyne Keel,   [facility] pharmacy called.  [facility] pharmacy stated refill request for Hydroxyzine was refused. They requested a 90 day RX be sent to them for Hydroxyzine.  Patient takes three times daily.  Patient due to follow up 01-16-15. Is it okay to refill?

## 2014-12-29 NOTE — Telephone Encounter (Signed)
Telephone call back with patient who left a message requesting a call back.  Patient reported Marcello Moores from her PCP was concerned patient's increased dosage of Wellbutrin could be adversely affecting patient's hypertension as has recently spiked and requested patient follow up with Dr. Doyne Keel to question this and to see if any medication adjustments might be warranted.  Patient reported she was taking Mobic as well and knew this could be having an affect too.  Patient stated she has been doing well lately but her BP has read 150/105 several times recently.  Patient reports NP may increase her BP if keeps staying up but wanted patient to follow up on these concerns first with Dr. Doyne Keel.  Informed Dr. Doyne Keel would not be back in the office until 01/02/15 but would send message with question to her in-box.

## 2015-01-02 ENCOUNTER — Telehealth (HOSPITAL_COMMUNITY): Payer: Self-pay

## 2015-01-02 MED ORDER — HYDROXYZINE HCL 10 MG PO TABS: 10.0000 mg | ORAL_TABLET | Freq: Three times a day (TID) | ORAL | Status: DC | PRN

## 2015-01-02 NOTE — Telephone Encounter (Signed)
Left a message for patient to call this nurse back after she left a message earlier requesting a call back verify her change in medication per Dr. Doyne Keel this date.

## 2015-01-02 NOTE — Telephone Encounter (Signed)
Dr. Doyne Keel decreased total daily dosage to just the Wellbutrin XL 300mg , one tablet by mouth each morning and discontinued the 150 mg daily dosage.  Left patient a message and will continue to monitor her BP to verify if has an overall affect.

## 2015-01-02 NOTE — Telephone Encounter (Signed)
Telephone call with Crystal, Pharmacist at [facility] in [place] to follow up on patient's status with requested 90 day order for Hydroxyzine.  Discussed with Dr. Doyne Keel who authorized 90 day order as patient is obtaining all her other prescriptions in 90 day supplies.  Informed pharmacist to discontinue patient's Wellbutrin XL 150mg  daily as Dr. Doyne Keel wanted patient to return to taking a total of 300mg  per day to see if possibly affecting patient's blood pressure negatively.  Informed pharmacist a new hydroxyzine order would be e-scribed over this date as patient last filled 11/16/14.  Left a message on patient's voice mail to inform of Dr. Havery Moros decrease in her daily usage of Wellbutrin XL to just the 300mg  by mouth each morning and to no longer take the 150mg  tablet.  Informed this was to see if would improve her recent problems with hypertension and to call back if any questions.  Informed of new hydroxyzine order sent [facility] and to keep appointment on 01/16/15.

## 2015-01-16 ENCOUNTER — Encounter (HOSPITAL_COMMUNITY): Payer: Self-pay | Admitting: Psychiatry

## 2015-01-16 ENCOUNTER — Ambulatory Visit (INDEPENDENT_AMBULATORY_CARE_PROVIDER_SITE_OTHER): Payer: PRIVATE HEALTH INSURANCE | Admitting: Psychiatry

## 2015-01-16 VITALS — BP 127/85 | HR 82 | Ht 69.0 in | Wt 236.2 lb

## 2015-01-16 DIAGNOSIS — F332 Major depressive disorder, recurrent severe without psychotic features: Secondary | ICD-10-CM | POA: Diagnosis not present

## 2015-01-16 DIAGNOSIS — F411 Generalized anxiety disorder: Secondary | ICD-10-CM

## 2015-01-16 DIAGNOSIS — F331 Major depressive disorder, recurrent, moderate: Secondary | ICD-10-CM

## 2015-01-16 DIAGNOSIS — F952 Tourette's disorder: Secondary | ICD-10-CM

## 2015-01-16 MED ORDER — ARIPIPRAZOLE 20 MG PO TABS: 20.0000 mg | ORAL_TABLET | Freq: Every day | ORAL | Status: DC

## 2015-01-16 MED ORDER — BUPROPION HCL ER (XL) 300 MG PO TB24: 300.0000 mg | ORAL_TABLET | ORAL | Status: DC

## 2015-01-16 MED ORDER — HYDROXYZINE HCL 10 MG PO TABS: 10.0000 mg | ORAL_TABLET | Freq: Three times a day (TID) | ORAL | Status: DC | PRN

## 2015-01-16 NOTE — Progress Notes (Signed)
[place] [phone] Progress Note  Penny Porter [id number] 49 y.o.  01/16/2015 1:44 PM  Chief Complaint: "somewhat better"  History of Present Illness: Pt reports multiple stressors including poor health and finances. Mom has mets to the lungs from her breast cancer.   Pt reports she has been monitoring her BP. She stopped the extra 150mg  of Wellbutrin and BP remains high. Her PCP told her that her BP would likely remain high as she gets older.   States she is having low motivation and anhedonia but has been trying out a few new activities at the recommendation of her therapist (listening to radio, reading, coloring). Reports on/off sad mood about 2 days a week. Pt has crying spells but they are even less frequent than before. Pt denies isolation and is usually with family. Pt feels worthless due to pain and inability to do the things she wants.   Pain continues unchanged. She is having a lot of hip and back pain and arm pain.  It limits her ability to do things and makes her feel depressed.  Pt is in PT for her hips and back. She has been diagnosed with Costochondralis and trigeminal neuralgia. Pain doesn't allow her to sleep well. Sleep is variable and some nights she sleeps 3-5 hrs or longer. Pt is not using her CPAP.  Appetite is variable and a lot of things she eats makes her feel sick. Pt is eating small meals and still has issues with N/V due to gasteroparesis. Pt has lost about 20 lbs since Feb 2016.   Energy remains generally low.   Anxiety comes and goes thru out the week. She has racing thoughts. Her frustration tolerance is really low and notes her BP spikes when anxious. No panic attacks since last visit. Driving and being in crowds cause anxiety and panic attacks.   Tics come and go depending on her anxiety and stress level. Vocal grunts, stomach jerking, eye squinting and arm jerking are all possible reactions. States stomach jerking never really goes away but seems to  be going on all day. She also had arm jerks randomly. States Vistaril TID is helping with anxiety and tics.   Pt is taking Wellbutrin and Abilify as prescribed and denies SE. States meds are helping.   Therapy is going well and she is trying hard to utilize the coping skills she has learned.   Suicidal Ideation: No Plan Formed: No Patient has means to carry out plan: No  Homicidal Ideation: No Plan Formed: No Patient has means to carry out plan: No  Review of Systems: Psychiatric: Agitation: No Hallucination: No Depressed Mood: Yes Insomnia: Yes Hypersomnia: No Altered Concentration: No Feels Worthless: Yes Grandiose Ideas: No Belief In Special Powers: No New/Increased Substance Abuse: No Compulsions: No  Neurologic: Headache: No Seizure: No Paresthesias: No   Review of Systems  Constitutional: Positive for weight loss. Negative for fever and chills.  HENT: Negative for congestion, ear pain, nosebleeds and sore throat.   Eyes: Positive for blurred vision. Negative for double vision and pain.  Respiratory: Negative for cough, shortness of breath and wheezing.   Cardiovascular: Positive for chest pain. Negative for palpitations and leg swelling.  Gastrointestinal: Positive for nausea, vomiting and abdominal pain. Negative for heartburn.  Musculoskeletal: Positive for back pain and joint pain. Negative for neck pain.  Skin: Negative for itching and rash.  Neurological: Positive for dizziness. Negative for sensory change, seizures, loss of consciousness and headaches.  Psychiatric/Behavioral: Positive for depression. Negative  for suicidal ideas, hallucinations and substance abuse. The patient is nervous/anxious. The patient does not have insomnia.      Past Medical Family, Social History: Pt lives in [place] with her mom and 2 sisters. Pt is single and does not have children.  Pt denies hx of or current use of cigs, alcohol and illicit drugs.  Family History  Problem  Relation Age of Onset  . Mental retardation Sister   . Dementia Father   . Suicidality Neg Hx    Past Medical History  Diagnosis Date  . Asthma   . Depression   . GERD (gastroesophageal reflux disease)   . Hypertension   . Tourette disease   . Gastroparesis   . Trigeminal nerve disease   . Costochondral chest pain   . Arthritis     Back and hip  . Stenosis of lumbosacral spine   . Degenerative disk disease   . Bursitis   . Thyroid nodule   . Sleep apnea     not using CPAP   Outpatient Encounter Prescriptions as of 01/16/2015  Medication Sig  . albuterol (PROVENTIL HFA;VENTOLIN HFA) 108 (90 BASE) MCG/ACT inhaler Inhale into the lungs every 6 (six) hours as needed for wheezing or shortness of breath.  . [place], Misc. (PROMISEB EX) Apply topically.  . ARIPiprazole (ABILIFY) 20 MG tablet Take 1 tablet (20 mg total) by mouth daily.  Marland Kitchen aspirin 81 MG tablet Take 81 mg by mouth daily.  Marland Kitchen azelastine (ASTELIN) 0.1 % nasal spray Place 2 sprays into both nostrils 2 (two) times daily. Use in each nostril as directed  . beclomethasone (QVAR) 80 MCG/ACT inhaler Inhale into the lungs 2 (two) times daily.  Marland Kitchen buPROPion (WELLBUTRIN XL) 300 MG 24 hr tablet Take 1 tablet (300 mg total) by mouth every morning.  . cetirizine (ZYRTEC) 10 MG tablet Take 10 mg by mouth daily.  . Cholecalciferol (VITAMIN D-3) 5000 UNITS TABS Take 1 tablet by mouth daily.  . cyclobenzaprine (FLEXERIL) 10 MG tablet Take 10 mg by mouth. Take 5-10mg  po TID as needed  . diclofenac (FLECTOR) 1.3 % PTCH Place 1 patch onto the skin 2 (two) times daily.  . Diclofenac (ZORVOLEX) 18 MG CAPS Take 18 mg by mouth daily.  . diclofenac sodium (VOLTAREN) 1 % GEL Apply 1 g topically 2 (two) times daily.  Marland Kitchen dicyclomine (BENTYL) 10 MG capsule Take 10 mg by mouth 3 (three) times daily before meals.  Marland Kitchen erythromycin (E-MYCIN) 250 MG tablet Take 250 mg by mouth daily.  . felodipine (PLENDIL) 5 MG 24 hr tablet Take 7.5 mg by  mouth daily.   . furosemide (LASIX) 20 MG tablet 20 mg. Take 1-2 tabs po up to twice a day as needed for edema  . hydrOXYzine (ATARAX/VISTARIL) 10 MG tablet Take 1 tablet (10 mg total) by mouth 3 (three) times daily as needed for anxiety.  Marland Kitchen ketoconazole (NIZORAL) 2 % cream Apply 1 application topically daily.  Marland Kitchen ketoconazole (NIZORAL) 2 % shampoo Apply 1 application topically 2 (two) times a week.  . lubiprostone (AMITIZA) 24 MCG capsule Take 24 mcg by mouth 2 (two) times daily with a meal.  . medroxyPROGESTERone (PROVERA) 10 MG tablet Take 10 mg by mouth daily.  . metroNIDAZOLE (METROCREAM) 0.75 % cream Apply topically 2 (two) times daily.  . mometasone (NASONEX) 50 MCG/ACT nasal spray Place 2 sprays into the nose daily.  Marland Kitchen olmesartan (BENICAR) 20 MG tablet Take 20 mg by mouth daily.  . ondansetron (ZOFRAN-ODT)  4 MG disintegrating tablet Take 4 mg by mouth 2 (two) times daily as needed for nausea or vomiting.  . pregabalin (LYRICA) 75 MG capsule Take 75 mg by mouth 2 (two) times daily.  . RABEprazole (ACIPHEX) 20 MG tablet Take 20 mg by mouth daily.  . ranitidine (ZANTAC) 300 MG tablet Take 300 mg by mouth at bedtime.  Marland Kitchen spironolactone (ALDACTONE) 25 MG tablet Take 25 mg by mouth daily.  . traMADol (ULTRAM) 50 MG tablet Take by mouth. 1-2 tabs po q6hr as needed for pain  . triamcinolone cream (KENALOG) 0.1 % Apply 1 application topically 2 (two) times daily.  . vitamin B-12 (CYANOCOBALAMIN) 1000 MCG tablet Take 1,000 mcg by mouth daily.  . fexofenadine (ALLEGRA) 180 MG tablet Take 180 mg by mouth daily.  Marland Kitchen HYDROcodone-acetaminophen (NORCO) 7.5-325 MG per tablet Take 1 tablet by mouth every 6 (six) hours as needed for moderate pain.  . Linaclotide (LINZESS) 290 MCG CAPS capsule Take 290 mcg by mouth daily.  . predniSONE (STERAPRED UNI-PAK) 10 MG tablet Take by mouth daily. taper  . pregabalin (LYRICA) 50 MG capsule Take 50 mg by mouth. Take 1 tab po qAM and 2 tabs qPM  . vitamin E 400 UNIT  capsule Take 400 Units by mouth daily.   No facility-administered encounter medications on file as of 01/16/2015.    Past Psychiatric History/Hospitalization(s): Anxiety: No Bipolar Disorder: No Depression: Yes Mania: No Psychosis: No Schizophrenia: No Personality Disorder: No Hospitalization for psychiatric illness: No History of Electroconvulsive Shock Therapy: No Prior Suicide Attempts: No  Physical Exam: Constitutional:  BP 127/85 mmHg  Pulse 82  Ht 5\' 9"  (1.753 m)  Wt 236 lb 3.2 oz (107.14 kg)  BMI 34.86 kg/m2  LMP 12/25/2014 (Approximate)  General Appearance: alert, oriented, no acute distress and obese  Musculoskeletal: Strength & Muscle Tone: within normal limits Gait & Station: moving slowly and walking with cane due to back problems Patient leans: N/A  Mental Status Examination/Evaluation: Objective: Attitude: Calm and cooperative  Appearance: Casual, appears to be stated age  Eye Contact::  Good  Speech:  Clear and Coherent and Normal Rate  Volume:  Normal  Mood:  depressed  Affect:  Congruent  Thought Process:  Goal Directed  Orientation:  Full (Time, Place, and Person)  Thought Content:  Negative  Suicidal Thoughts:  No  Homicidal Thoughts:  No  Judgement:  Fair  Insight:  Fair  Concentration: good  Memory: Immediate-fair Recent-fair Remote-fair  Recall: fair  Language: fair  Gait and Station: normal  [employer] of Knowledge: average  Psychomotor Activity:  Normal walking slow with a cane  Akathisia:  No  Handed:  Right  AIMS (if indicated):  Facial and Oral Movements  Muscles of Facial Expression: None, normal  Lips and Perioral Area: None, normal  Jaw: None, normal  Tongue: None, normal Extremity Movements: Upper (arms, wrists, hands, fingers): None, normal  Lower (legs, knees, ankles, toes): None, normal,  Trunk Movements:  Neck, shoulders, hips: None, normal,  Overall Severity : Severity of abnormal movements (highest score from  questions above): None, normal  Incapacitation due to abnormal movements: None, normal  Patient's awareness of abnormal movements (rate only patient's report): No Awareness, Dental Status  Current problems with teeth and/or dentures?: No  Does patient usually wear dentures?: No    Assets:  Communication Skills Desire for Improvement Housing Administrator, Civil Service (Choose Three): Established Problem, Stable/Improving (1), Review of Psycho-Social Stressors (1),  Established Problem, Worsening (2), Review of Medication Regimen & Side Effects (2) and Review of New Medication or Change in Dosage (2)   Assessment: AXIS I : Major Depression, Recurrent severe without psychotic features and Tourettes  AXIS II : Deferred      Treatment Plan/Recommendations:  Plan of Care:  Medication management with supportive therapy. Risks/benefits and SE of the medication discussed. Pt verbalized understanding and verbal consent obtained for treatment.  Affirm with the patient that the medications are taken as ordered. Patient expressed understanding of how their medications were to be used.     Laboratory: reviewed EKG 01/27/2014 QTc 406, NSR; order labs and EKG at next appt  Psychotherapy: Therapy: brief supportive therapy provided. Discussed psychosocial stressors in detail.     Discussed [place] as a possibility in the future. She is concerned about finding transportation.   Medications: Abilify 20mg  daily for mood augmentation and tics  Decrease Wellbutrin XR to 300mg  for depression  -Vistaril 10mg  po TID prn anxiety -declined changes to meds today Due to untreated sleep apnea pt advised not to use sedating meds at bedtime such as Clonazepam and pain meds.   Routine PRN Medications: No  Consultations: encouraged to continue appts with therapist  Safety Concerns: Pt denies SI and is at an acute low risk for suicide.Patient told to call clinic if any problems occur.  Patient advised to go to ER if they should develop SI/HI, side effects, or if symptoms worsen. Has crisis numbers to call if needed. Pt verbalized understanding.   Other: F/up in 5 months or sooner if needed     Charlcie Cradle, MD 01/16/2015

## 2015-03-12 ENCOUNTER — Other Ambulatory Visit: Payer: Self-pay | Admitting: Neurosurgery

## 2015-03-12 DIAGNOSIS — M5023 Other cervical disc displacement, cervicothoracic region: Secondary | ICD-10-CM

## 2015-03-15 ENCOUNTER — Telehealth (HOSPITAL_COMMUNITY): Payer: Self-pay

## 2015-05-22 DIAGNOSIS — M5126 Other intervertebral disc displacement, lumbar region: Secondary | ICD-10-CM | POA: Insufficient documentation

## 2015-05-29 ENCOUNTER — Ambulatory Visit (HOSPITAL_COMMUNITY): Payer: Self-pay | Admitting: Psychiatry

## 2015-06-04 DIAGNOSIS — M48061 Spinal stenosis, lumbar region without neurogenic claudication: Secondary | ICD-10-CM | POA: Insufficient documentation

## 2015-06-19 ENCOUNTER — Encounter (HOSPITAL_COMMUNITY): Payer: Self-pay | Admitting: Psychiatry

## 2015-06-19 ENCOUNTER — Ambulatory Visit (INDEPENDENT_AMBULATORY_CARE_PROVIDER_SITE_OTHER): Payer: PRIVATE HEALTH INSURANCE | Admitting: Psychiatry

## 2015-06-19 VITALS — BP 133/83 | HR 94 | Ht 69.0 in | Wt 247.8 lb

## 2015-06-19 DIAGNOSIS — F411 Generalized anxiety disorder: Secondary | ICD-10-CM

## 2015-06-19 DIAGNOSIS — Z79899 Other long term (current) drug therapy: Secondary | ICD-10-CM

## 2015-06-19 DIAGNOSIS — F332 Major depressive disorder, recurrent severe without psychotic features: Secondary | ICD-10-CM

## 2015-06-19 DIAGNOSIS — F952 Tourette's disorder: Secondary | ICD-10-CM

## 2015-06-19 MED ORDER — HYDROXYZINE HCL 10 MG PO TABS: 10.0000 mg | ORAL_TABLET | Freq: Three times a day (TID) | ORAL | Status: DC | PRN

## 2015-06-19 MED ORDER — ARIPIPRAZOLE 20 MG PO TABS: 20.0000 mg | ORAL_TABLET | Freq: Every day | ORAL | Status: DC

## 2015-06-19 MED ORDER — BUPROPION HCL ER (XL) 300 MG PO TB24: 300.0000 mg | ORAL_TABLET | ORAL | Status: DC

## 2015-06-19 NOTE — Progress Notes (Signed)
Patient ID: Penny Porter, female   DOB: [birth year], 49 y.o.   MRN: [id number]  [place] Progress Note  Penny Porter [id number] 49 y.o.  06/19/2015 2:28 PM  Chief Complaint: "somewhat better"  History of Present Illness: Pt reports multiple stressors including poor health and finances. Pt was on Prednisone twice in the last month and is now feeling depressed. Pt is also frustrated because she had to get a new insurance and several of her meds are now tier 3. Pt is very worried about the cost. Pt is waiting on long term disability to be approved. Sister is helping pt financially a little.   States she is having on/off low motivation and anhedonia but has been trying out a few new activities at the recommendation of her therapist (listening to radio, reading, coloring). Reports on/off sad mood about 2 days a week. Pt has crying spells but they are even less frequent than before. Pt denies isolation and is usually with family. Pt feels worthless due to pain and inability to do the things she wants.   Pain continues unchanged. She is having a lot of hip and back pain and arm pain.  It limits her ability to do things and makes her feel depressed.  Pt is no longer in PT for her hips and back. She has been diagnosed with Costochondralis and trigeminal neuralgia. Pt states she can't sit or stand for long periods of time. She feels she is unable to work at all.   Sleep is improved. Pt is not using her CPAP.  Appetite is variable and maybe due to recent treatment with steriods. She has been snacking thru out the day.   Energy remains generally low.   Anxiety is increased. She has racing thoughts. Her frustration tolerance is really low and notes her BP spikes when anxious. No panic attacks since last visit. Driving and being in crowds cause anxiety and panic attacks.   Tics come and go depending on her anxiety and stress level. Vocal grunts, stomach jerking, eye squinting and arm  jerking are all possible reactions. States stomach jerking never really goes away but seems to be going on all day. She also had arm jerks randomly. States Vistaril TID is helping with anxiety and tics.   Pt is taking Wellbutrin and Abilify as prescribed and denies SE. States meds are helping.   Therapy is going well and she is trying hard to utilize the coping skills she has learned.   Suicidal Ideation: No Plan Formed: No Patient has means to carry out plan: No  Homicidal Ideation: No Plan Formed: No Patient has means to carry out plan: No  Review of Systems: Psychiatric: Agitation: No Hallucination: No Depressed Mood: Yes Insomnia: Yes Hypersomnia: No Altered Concentration: No Feels Worthless: Yes Grandiose Ideas: No Belief In Special Powers: No New/Increased Substance Abuse: No Compulsions: No  Neurologic: Headache: No Seizure: No Paresthesias: No   Review of Systems  Constitutional: Negative for fever, chills and weight loss.  HENT: Negative for congestion, ear pain, nosebleeds and sore throat.   Eyes: Positive for blurred vision and pain. Negative for double vision.  Respiratory: Negative for cough, shortness of breath and wheezing.   Cardiovascular: Negative for chest pain, palpitations and leg swelling.  Gastrointestinal: Positive for nausea, vomiting and abdominal pain. Negative for heartburn.  Musculoskeletal: Positive for back pain and joint pain. Negative for neck pain.  Skin: Negative for itching and rash.  Neurological: Positive for dizziness and headaches. Negative for  sensory change, seizures and loss of consciousness.  Psychiatric/Behavioral: Positive for depression. Negative for suicidal ideas, hallucinations and substance abuse. The patient is nervous/anxious and has insomnia.      Past Medical Family, Social History: Pt lives in [place] with her mom and 2 sisters. Pt is single and does not have children.  Pt is unemployed. Pt denies hx of or current use  of cigs, alcohol and illicit drugs.  Family History  Problem Relation Age of Onset  . Mental retardation Sister   . Dementia Father   . Suicidality Neg Hx    Past Medical History  Diagnosis Date  . Asthma   . Depression   . GERD (gastroesophageal reflux disease)   . Hypertension   . Tourette disease   . Gastroparesis   . Trigeminal nerve disease   . Costochondral chest pain   . Arthritis     Back and hip  . Stenosis of lumbosacral spine   . Degenerative disk disease   . Bursitis   . Thyroid nodule   . Sleep apnea     not using CPAP   Outpatient Encounter Prescriptions as of 06/19/2015  Medication Sig  . albuterol (PROVENTIL HFA;VENTOLIN HFA) 108 (90 BASE) MCG/ACT inhaler Inhale into the lungs every 6 (six) hours as needed for wheezing or shortness of breath.  . [place], Misc. (PROMISEB EX) Apply topically.  . ARIPiprazole (ABILIFY) 20 MG tablet Take 1 tablet (20 mg total) by mouth daily.  Marland Kitchen aspirin 81 MG tablet Take 81 mg by mouth daily.  Marland Kitchen azelastine (ASTELIN) 0.1 % nasal spray Place 2 sprays into both nostrils 2 (two) times daily. Use in each nostril as directed  . beclomethasone (QVAR) 80 MCG/ACT inhaler Inhale into the lungs 2 (two) times daily.  Marland Kitchen buPROPion (WELLBUTRIN XL) 300 MG 24 hr tablet Take 1 tablet (300 mg total) by mouth every morning.  . cetirizine (ZYRTEC) 10 MG tablet Take 10 mg by mouth daily.  . Cholecalciferol (VITAMIN D-3) 5000 UNITS TABS Take 1 tablet by mouth daily.  . cyclobenzaprine (FLEXERIL) 10 MG tablet Take 10 mg by mouth. Take 5-10mg  po TID as needed  . diclofenac (FLECTOR) 1.3 % PTCH Place 1 patch onto the skin 2 (two) times daily.  . Diclofenac (ZORVOLEX) 18 MG CAPS Take 18 mg by mouth daily.  . diclofenac sodium (VOLTAREN) 1 % GEL Apply 1 g topically 2 (two) times daily.  Marland Kitchen dicyclomine (BENTYL) 10 MG capsule Take 10 mg by mouth 3 (three) times daily before meals.  Marland Kitchen erythromycin (E-MYCIN) 250 MG tablet Take 250 mg by mouth  daily.  . felodipine (PLENDIL) 5 MG 24 hr tablet Take 7.5 mg by mouth daily.   . fexofenadine (ALLEGRA) 180 MG tablet Take 180 mg by mouth daily.  . furosemide (LASIX) 20 MG tablet 20 mg. Take 1-2 tabs po up to twice a day as needed for edema  . hydrOXYzine (ATARAX/VISTARIL) 10 MG tablet Take 1 tablet (10 mg total) by mouth 3 (three) times daily as needed for anxiety.  Marland Kitchen ketoconazole (NIZORAL) 2 % cream Apply 1 application topically daily.  Marland Kitchen ketoconazole (NIZORAL) 2 % shampoo Apply 1 application topically 2 (two) times a week.  . lubiprostone (AMITIZA) 24 MCG capsule Take 24 mcg by mouth 2 (two) times daily with a meal.  . medroxyPROGESTERone (PROVERA) 10 MG tablet Take 10 mg by mouth daily.  . metroNIDAZOLE (METROCREAM) 0.75 % cream Apply topically 2 (two) times daily.  . mometasone (NASONEX) 50 MCG/ACT  nasal spray Place 2 sprays into the nose daily.  Marland Kitchen olmesartan (BENICAR) 20 MG tablet Take 20 mg by mouth daily.  . ondansetron (ZOFRAN-ODT) 4 MG disintegrating tablet Take 4 mg by mouth 2 (two) times daily as needed for nausea or vomiting.  . pregabalin (LYRICA) 75 MG capsule Take 75 mg by mouth 2 (two) times daily.  . RABEprazole (ACIPHEX) 20 MG tablet Take 20 mg by mouth daily.  . ranitidine (ZANTAC) 300 MG tablet Take 300 mg by mouth at bedtime.  Marland Kitchen spironolactone (ALDACTONE) 25 MG tablet Take 25 mg by mouth daily.  . traMADol (ULTRAM) 50 MG tablet Take by mouth. 1-2 tabs po q6hr as needed for pain  . triamcinolone cream (KENALOG) 0.1 % Apply 1 application topically 2 (two) times daily.  . vitamin B-12 (CYANOCOBALAMIN) 1000 MCG tablet Take 1,000 mcg by mouth daily.  . vitamin E 400 UNIT capsule Take 400 Units by mouth daily.  Marland Kitchen HYDROcodone-acetaminophen (NORCO) 7.5-325 MG per tablet Take 1 tablet by mouth every 6 (six) hours as needed for moderate pain.  . Linaclotide (LINZESS) 290 MCG CAPS capsule Take 290 mcg by mouth daily.  . predniSONE (STERAPRED UNI-PAK) 10 MG tablet Take by mouth  daily. taper  . pregabalin (LYRICA) 50 MG capsule Take 50 mg by mouth. Take 1 tab po qAM and 2 tabs qPM   No facility-administered encounter medications on file as of 06/19/2015.    Past Psychiatric History/Hospitalization(s): Anxiety: No Bipolar Disorder: No Depression: Yes Mania: No Psychosis: No Schizophrenia: No Personality Disorder: No Hospitalization for psychiatric illness: No History of Electroconvulsive Shock Therapy: No Prior Suicide Attempts: No  Physical Exam: Constitutional:  BP 133/83 mmHg  Pulse 94  Ht 5\' 9"  (1.753 m)  Wt 247 lb 12.8 oz (112.401 kg)  BMI 36.58 kg/m2  General Appearance: alert, oriented, no acute distress and obese  Musculoskeletal: Strength & Muscle Tone: within normal limits Gait & Station: moving slowly and walking with cane due to back problems Patient leans: N/A  Mental Status Examination/Evaluation: Objective: Attitude: Calm and cooperative  Appearance: Casual, appears to be stated age  Eye Contact::  Good  Speech:  Clear and Coherent and Normal Rate  Volume:  Normal  Mood:  Depressed and anxious  Affect:  Congruent  Thought Process:  Goal Directed  Orientation:  Full (Time, Place, and Person)  Thought Content:  Negative  Suicidal Thoughts:  No  Homicidal Thoughts:  No  Judgement:  Fair  Insight:  Fair  Concentration: good  Memory: Immediate-fair Recent-fair Remote-fair  Recall: fair  Language: fair  Gait and Station: normal  [employer] of Knowledge: average  Psychomotor Activity:  Normal walking slow with a cane. No visible tics.   Akathisia:  No  Handed:  Right  AIMS (if indicated):  Facial and Oral Movements  Muscles of Facial Expression: None, normal  Lips and Perioral Area: None, normal  Jaw: None, normal  Tongue: None, normal Extremity Movements: Upper (arms, wrists, hands, fingers): None, normal  Lower (legs, knees, ankles, toes): None, normal,  Trunk Movements:  Neck, shoulders, hips: None, normal,   Overall Severity : Severity of abnormal movements (highest score from questions above): None, normal  Incapacitation due to abnormal movements: None, normal  Patient's awareness of abnormal movements (rate only patient's report): No Awareness, Dental Status  Current problems with teeth and/or dentures?: No  Does patient usually wear dentures?: No    Assets:  Communication Skills Desire for Improvement Housing Herbalist  Medical Decision Making (Choose Three): Established Problem, Stable/Improving (1), Review of Psycho-Social Stressors (1), Review or order clinical lab tests (1), Established Problem, Worsening (2), Review of Medication Regimen & Side Effects (2) and Review of New Medication or Change in Dosage (2)   Assessment: AXIS I : Major Depression, Recurrent severe without psychotic features and Tourettes  AXIS II : Deferred      Treatment Plan/Recommendations:  Plan of Care:  Medication management with supportive therapy. Risks/benefits and SE of the medication discussed. Pt verbalized understanding and verbal consent obtained for treatment.  Affirm with the patient that the medications are taken as ordered. Patient expressed understanding of how their medications were to be used.     Laboratory: reviewed EKG 01/27/2014 QTc 406, NSR;    CBC, CMP, HbA1c, Lipid panel, TSH, Prolactin level, EKG   Psychotherapy: Therapy: brief supportive therapy provided. Discussed psychosocial stressors in detail.     Discussed [place] as a possibility in the future. She is concerned about finding transportation.   Medications: Abilify 20mg  daily for mood augmentation and tics  -Wellbutrin XR to 300mg  for depression. Higher doses associated with hypertension -Vistaril 10mg  po TID prn anxiety -declined changes to meds today Due to untreated sleep apnea pt advised not to use sedating meds at bedtime such as Clonazepam and pain meds.   Routine PRN Medications: No   Consultations: encouraged to continue appts with therapist  Safety Concerns: Pt denies SI and is at an acute low risk for suicide.Patient told to call clinic if any problems occur. Patient advised to go to ER if they should develop SI/HI, side effects, or if symptoms worsen. Has crisis numbers to call if needed. Pt verbalized understanding.   Other: F/up in 3 months or sooner if needed     Charlcie Cradle, MD 06/19/2015

## 2015-06-19 NOTE — Patient Instructions (Signed)
EKG call [phone] 

## 2015-06-28 ENCOUNTER — Telehealth (HOSPITAL_COMMUNITY): Payer: Self-pay

## 2015-06-28 NOTE — Telephone Encounter (Signed)
Telephone call with patient after she left a message wanting to know how her recent blood work came out. Informed patient we had not received any labs in [facility] and patient stated she had this done at [facility].  Agreed to look for labs and will call patient back if they have not been received.

## 2015-06-28 NOTE — Telephone Encounter (Signed)
No I haven't seen anything yet  

## 2015-07-04 NOTE — Telephone Encounter (Signed)
Telephone call with patient to inform Dr. Doyne Keel still had not received a copy of her reported recent lab work and patient stated she would contact the facility she had the labs taken to request they fax results.

## 2015-07-11 ENCOUNTER — Telehealth (HOSPITAL_COMMUNITY): Payer: Self-pay

## 2015-07-11 NOTE — Telephone Encounter (Signed)
Telephone call with patient after she left a message questioning if Dr. Doyne Keel received her recent completed lab work from her PCP and EKG information.  Informed this nurse was aware something came in for her but was not sure it this was both the lab work and EKG report and agreed to question Dr. Doyne Keel if she had received them when back in the office on 07/12/15.  Agreed to let patient know if all needed items not received and patient agreed with plan as states her PCP office was faxing things over to Dr. Doyne Keel on 07/06/15.

## 2015-07-12 NOTE — Telephone Encounter (Signed)
I got her EKG. It was normal.

## 2015-07-17 NOTE — Telephone Encounter (Signed)
Met with Dr. Doyne Keel on fax from [place] questioning if patient can be on Hydroxyzine and Azithromycin at the same time?  Called patient who reported she takes Azithromycin for Rosacea.  Patient reports she has been on this medication for awhile and no problems.  Informed of concern these 2 medications plus Abilify may cause QT elongation heart issues.  Patient stated understanding and discussed more with Dr. Doyne Keel as patient reported she was concerned about stopping Hydroxyzine due to need for anxiety.  Patient stated she would contact the MD providing Azithromycin and make sure they knew she was on both medication and Abilify and see if they wanted to change the Azithromycin or were okay with all three continuing together.  Informed patient this was the expectation of Dr. Doyne Keel and she was okay with her continuing Hydroxyzine as long as other provider was in approval.  Called [facility] and spoke with Nicki Reaper, pharmacist there to inform of discussion with patient and Dr. Doyne Keel and plan for patient to follow up with the provider ordering Azithromycin to see if they were okay with patient continuing both medications and plan for patient to contact them back or the provider that prescribes the Azithromycin to instruct if they can both continue together.  Informed Dr. Doyne Keel was in approval for patient to continue with Hydroxyzine if this concern was vetted through the other provider for approval.

## 2015-07-18 ENCOUNTER — Telehealth: Payer: Self-pay

## 2015-07-18 NOTE — Telephone Encounter (Signed)
Pt states that her drug company called and was concerned about the combination of Azithromycin, Hydroxyzine, and Abilify. Are these okay to be taken together?

## 2015-07-19 NOTE — Telephone Encounter (Signed)
Chart given to Dr.Kozlow.

## 2015-07-19 NOTE — Telephone Encounter (Signed)
Please bring me office chart to review

## 2015-07-20 NOTE — Telephone Encounter (Signed)
Chart is on your desk, please advise. -Thanks!

## 2015-07-23 NOTE — Telephone Encounter (Signed)
PATIENT ADVISED OF DR Luane School FOR AZITHROMYCIN AND TO CHECK WITH MD IN [place] THAT RX HYDROXYZINE FOR THAT INTERACTION. PT UNDERSTOOD

## 2015-07-23 NOTE — Telephone Encounter (Signed)
Please advise patient that the interaction between abilify and azithromycin is a possibility although very uncommon. To be on the safe side, lower dose of azithromycin to three times per week and see if this dose control her rosacea. I don't know about the hydroxyzine. Who prescribed hydroxyzine and for what purpose?

## 2015-07-26 ENCOUNTER — Telehealth (HOSPITAL_COMMUNITY): Payer: Self-pay

## 2015-07-26 NOTE — Telephone Encounter (Signed)
Telephone call with patient to inform Dr. Doyne Keel had received her lab work from [facility] and reviewed them with only high level noted of triglycerides being 178 mg/Dl but no present concerns noted.  Labs to be scanned into EPIC.  Discussed with Dr.Agarwal again concern from [facility] about patient taking Hydroxyzine with Abilify and Erythromycin due to QT elongation concerns.  Patient's recent EKG reviewed and patient also reported her dermatologist changed there Erythromycin to 3 times a week.  Discussed with patient who reports never any problems with taking all 3 medications together and wants to continue medication as prescribed.  Called [facility] and spoke with Glennon Hamilton, pharmacist to inform of approval to continue Hydroxyzine by Dr. Doyne Keel and informed per patient's reports her dermatologist cut her Erythromycin back to 3 times a week.  Informed patient's recent EKG normal and patient reports taking all 3 medications, Abilify, Erythromycin and Hydroxyzine for a long time without any concerns noted in past. Order was sent out as approved.

## 2015-09-16 DIAGNOSIS — K5909 Other constipation: Secondary | ICD-10-CM | POA: Insufficient documentation

## 2015-09-16 DIAGNOSIS — J4540 Moderate persistent asthma, uncomplicated: Secondary | ICD-10-CM | POA: Insufficient documentation

## 2015-09-16 DIAGNOSIS — I10 Essential (primary) hypertension: Secondary | ICD-10-CM | POA: Insufficient documentation

## 2015-09-16 DIAGNOSIS — M5030 Other cervical disc degeneration, unspecified cervical region: Secondary | ICD-10-CM | POA: Insufficient documentation

## 2015-09-16 DIAGNOSIS — R5381 Other malaise: Secondary | ICD-10-CM | POA: Insufficient documentation

## 2015-09-16 DIAGNOSIS — G4733 Obstructive sleep apnea (adult) (pediatric): Secondary | ICD-10-CM | POA: Insufficient documentation

## 2015-09-16 DIAGNOSIS — E782 Mixed hyperlipidemia: Secondary | ICD-10-CM | POA: Insufficient documentation

## 2015-09-16 DIAGNOSIS — E538 Deficiency of other specified B group vitamins: Secondary | ICD-10-CM | POA: Insufficient documentation

## 2015-09-16 DIAGNOSIS — E559 Vitamin D deficiency, unspecified: Secondary | ICD-10-CM | POA: Insufficient documentation

## 2015-09-16 DIAGNOSIS — E042 Nontoxic multinodular goiter: Secondary | ICD-10-CM | POA: Insufficient documentation

## 2015-09-19 HISTORY — PX: BACK SURGERY: SHX140

## 2015-09-20 ENCOUNTER — Ambulatory Visit (HOSPITAL_COMMUNITY): Payer: Self-pay | Admitting: Psychiatry

## 2015-09-21 ENCOUNTER — Ambulatory Visit: Payer: Self-pay | Admitting: Allergy and Immunology

## 2015-09-24 ENCOUNTER — Ambulatory Visit: Payer: Self-pay | Admitting: Allergy and Immunology

## 2015-10-03 DIAGNOSIS — Z5181 Encounter for therapeutic drug level monitoring: Secondary | ICD-10-CM | POA: Insufficient documentation

## 2015-10-09 ENCOUNTER — Encounter (HOSPITAL_COMMUNITY): Payer: Self-pay | Admitting: Psychiatry

## 2015-10-09 ENCOUNTER — Ambulatory Visit (INDEPENDENT_AMBULATORY_CARE_PROVIDER_SITE_OTHER): Payer: BLUE CROSS/BLUE SHIELD | Admitting: Psychiatry

## 2015-10-09 VITALS — BP 128/87 | HR 106 | Ht 69.0 in | Wt 257.2 lb

## 2015-10-09 DIAGNOSIS — F952 Tourette's disorder: Secondary | ICD-10-CM | POA: Diagnosis not present

## 2015-10-09 DIAGNOSIS — F411 Generalized anxiety disorder: Secondary | ICD-10-CM

## 2015-10-09 DIAGNOSIS — F332 Major depressive disorder, recurrent severe without psychotic features: Secondary | ICD-10-CM

## 2015-10-09 MED ORDER — BUPROPION HCL ER (XL) 300 MG PO TB24: 300.0000 mg | ORAL_TABLET | ORAL | Status: DC

## 2015-10-09 MED ORDER — ARIPIPRAZOLE 20 MG PO TABS: 20.0000 mg | ORAL_TABLET | Freq: Every day | ORAL | Status: DC

## 2015-10-09 MED ORDER — HYDROXYZINE HCL 10 MG PO TABS: 10.0000 mg | ORAL_TABLET | Freq: Three times a day (TID) | ORAL | Status: DC | PRN

## 2015-10-09 NOTE — Progress Notes (Signed)
[place] [phone] Progress Note  Penny Porter [id number] 50 y.o.  10/09/2015 3:06 PM  Chief Complaint: "ok"  History of Present Illness: Pt reports multiple stressors including poor health and finances. Pt had back surgery 3 weeks ago and is slowly recovering.    Depression comes and goes. States she is having on/off low motivation and anhedonia but has been trying out a few new activities at the recommendation of her therapist (listening to radio, reading, coloring). Reports on/off sad mood about 2 days a week. Pt has crying spells but they are even less frequent than before. Pt denies isolation and is usually with family. Pt feels worthless due to pain and inability to do the things she wants.   Sleep is so/so due to pain.  Appetite is good but she has gained weight since her surgery.   Energy remains generally low. Pt is trying to walk daily.   Anxiety is ok. She has on/off racing thoughts. Her frustration tolerance is really low. No panic attacks since last visit. Driving and being in crowds cause anxiety and panic attacks.   Tics come and go depending on her anxiety and stress level. Vocal grunts, stomach jerking, eye squinting and arm jerking are all possible reactions. States stomach jerking never really goes away but seems to be going on all day. She also had arm jerks randomly. States Vistaril TID is helping with anxiety and tics but has not taken it in several days due to sedation.  Pt is taking Wellbutrin and Abilify as prescribed and denies SE. States meds are helping.   Therapy is going well and she is trying hard to utilize the coping skills she has learned.   Suicidal Ideation: No Plan Formed: No Patient has means to carry out plan: No  Homicidal Ideation: No Plan Formed: No Patient has means to carry out plan: No  Review of Systems: Psychiatric: Agitation: No Hallucination: No Depressed Mood: Yes Insomnia: Yes Hypersomnia: No Altered Concentration:  No Feels Worthless: Yes Grandiose Ideas: No Belief In Special Powers: No New/Increased Substance Abuse: No Compulsions: No    Review of Systems  Constitutional: Negative for fever, chills and weight loss.  HENT: Negative for congestion, ear pain, nosebleeds and sore throat.   Eyes: Positive for blurred vision and pain. Negative for double vision.  Respiratory: Negative for cough, shortness of breath and wheezing.   Cardiovascular: Negative for chest pain, palpitations and leg swelling.  Gastrointestinal: Negative for heartburn, nausea, vomiting and abdominal pain.  Musculoskeletal: Positive for back pain and joint pain. Negative for neck pain.  Skin: Negative for itching and rash.  Neurological: Positive for dizziness and headaches. Negative for sensory change, seizures and loss of consciousness.  Psychiatric/Behavioral: Positive for depression. Negative for suicidal ideas, hallucinations and substance abuse. The patient is nervous/anxious and has insomnia.      Past Medical Family, Social History: Pt lives in [place] with her mom and 2 sisters. Pt is single and does not have children.  Pt is unemployed. Pt denies hx of or current use of cigs, alcohol and illicit drugs.  Family History  Problem Relation Age of Onset  . Mental retardation Sister   . Dementia Father   . Suicidality Neg Hx    Past Medical History  Diagnosis Date  . Asthma   . Depression   . GERD (gastroesophageal reflux disease)   . Hypertension   . Tourette disease   . Gastroparesis   . Trigeminal nerve disease   .  Costochondral chest pain   . Arthritis     Back and hip  . Stenosis of lumbosacral spine   . Degenerative disk disease   . Bursitis   . Thyroid nodule   . Sleep apnea     not using CPAP   Outpatient Encounter Prescriptions as of 10/09/2015  Medication Sig  . albuterol (PROVENTIL HFA;VENTOLIN HFA) 108 (90 BASE) MCG/ACT inhaler Inhale into the lungs every 6 (six) hours as needed for wheezing  or shortness of breath.  . [place], Misc. (PROMISEB EX) Apply topically.  . ARIPiprazole (ABILIFY) 20 MG tablet Take 1 tablet (20 mg total) by mouth daily.  Marland Kitchen aspirin 81 MG tablet Take 81 mg by mouth daily.  Marland Kitchen azelastine (ASTELIN) 0.1 % nasal spray Place 2 sprays into both nostrils 2 (two) times daily. Use in each nostril as directed  . beclomethasone (QVAR) 80 MCG/ACT inhaler Inhale into the lungs 2 (two) times daily.  Marland Kitchen buPROPion (WELLBUTRIN XL) 300 MG 24 hr tablet Take 1 tablet (300 mg total) by mouth every morning.  . cetirizine (ZYRTEC) 10 MG tablet Take 10 mg by mouth daily.  . Cholecalciferol (VITAMIN D-3) 5000 UNITS TABS Take 1 tablet by mouth daily.  . diclofenac sodium (VOLTAREN) 1 % GEL Apply 1 g topically 2 (two) times daily.  Marland Kitchen dicyclomine (BENTYL) 10 MG capsule Take 10 mg by mouth 3 (three) times daily before meals.  Marland Kitchen erythromycin (E-MYCIN) 250 MG tablet Take 250 mg by mouth daily.  . furosemide (LASIX) 20 MG tablet 20 mg. Take 1-2 tabs po up to twice a day as needed for edema  . hydrOXYzine (ATARAX/VISTARIL) 10 MG tablet Take 1 tablet (10 mg total) by mouth 3 (three) times daily as needed for anxiety.  Marland Kitchen ketoconazole (NIZORAL) 2 % cream Apply 1 application topically daily.  Marland Kitchen ketoconazole (NIZORAL) 2 % shampoo Apply 1 application topically 2 (two) times a week.  . lubiprostone (AMITIZA) 24 MCG capsule Take 24 mcg by mouth 2 (two) times daily with a meal.  . metoprolol (LOPRESSOR) 50 MG tablet Take 50 mg by mouth daily.  . metroNIDAZOLE (METROCREAM) 0.75 % cream Apply topically 2 (two) times daily.  . mometasone (NASONEX) 50 MCG/ACT nasal spray Place 2 sprays into the nose daily.  Marland Kitchen olmesartan (BENICAR) 20 MG tablet Take 20 mg by mouth daily.  . ondansetron (ZOFRAN-ODT) 4 MG disintegrating tablet Take 4 mg by mouth 2 (two) times daily as needed for nausea or vomiting.  . pregabalin (LYRICA) 75 MG capsule Take 75 mg by mouth 2 (two) times daily. Reported on  10/09/2015  . RABEprazole (ACIPHEX) 20 MG tablet Take 20 mg by mouth daily.  . ranitidine (ZANTAC) 300 MG tablet Take 300 mg by mouth at bedtime.  Marland Kitchen spironolactone (ALDACTONE) 25 MG tablet Take 25 mg by mouth daily.  . traMADol (ULTRAM) 50 MG tablet Take by mouth. 1-2 tabs po q6hr as needed for pain  . triamcinolone cream (KENALOG) 0.1 % Apply 1 application topically 2 (two) times daily.  . vitamin B-12 (CYANOCOBALAMIN) 1000 MCG tablet Take 1,000 mcg by mouth daily.  . cyclobenzaprine (FLEXERIL) 10 MG tablet Take 10 mg by mouth. Reported on 10/09/2015  . diclofenac (FLECTOR) 1.3 % PTCH Place 1 patch onto the skin 2 (two) times daily. Reported on 10/09/2015  . Diclofenac (ZORVOLEX) 18 MG CAPS Take 18 mg by mouth daily. Reported on 10/09/2015  . felodipine (PLENDIL) 5 MG 24 hr tablet Take 7.5 mg by mouth daily. Reported on  10/09/2015  . fexofenadine (ALLEGRA) 180 MG tablet Take 180 mg by mouth daily. Reported on 10/09/2015  . HYDROcodone-acetaminophen (NORCO) 7.5-325 MG per tablet Take 1 tablet by mouth every 6 (six) hours as needed for moderate pain. Reported on 10/09/2015  . Linaclotide (LINZESS) 290 MCG CAPS capsule Take 290 mcg by mouth daily. Reported on 10/09/2015  . medroxyPROGESTERone (PROVERA) 10 MG tablet Take 10 mg by mouth daily. Reported on 10/09/2015  . predniSONE (STERAPRED UNI-PAK) 10 MG tablet Take by mouth daily. Reported on 10/09/2015  . pregabalin (LYRICA) 50 MG capsule Take 50 mg by mouth. Reported on 10/09/2015  . vitamin E 400 UNIT capsule Take 400 Units by mouth daily. Reported on 10/09/2015   No facility-administered encounter medications on file as of 10/09/2015.    Past Psychiatric History/Hospitalization(s): Anxiety: No Bipolar Disorder: No Depression: Yes Mania: No Psychosis: No Schizophrenia: No Personality Disorder: No Hospitalization for psychiatric illness: No History of Electroconvulsive Shock Therapy: No Prior Suicide Attempts: No  Physical  Exam: Constitutional:  BP 128/87 mmHg  Pulse 106  Ht 5\' 9"  (1.753 m)  Wt 257 lb 3.2 oz (116.665 kg)  BMI 37.96 kg/m2  General Appearance: alert, oriented, no acute distress and obese  Musculoskeletal: Strength & Muscle Tone: within normal limits Gait & Station: moving slowly and walking with cane due to back problems Patient leans: N/A  Mental Status Examination/Evaluation: Objective: Attitude: Calm and cooperative  Appearance: Casual, appears to be stated age  Eye Contact::  Good  Speech:  Clear and Coherent and Normal Rate  Volume:  Normal  Mood:  Depressed and anxious  Affect:  Congruent  Thought Process:  Goal Directed  Orientation:  Full (Time, Place, and Person)  Thought Content:  Negative  Suicidal Thoughts:  No  Homicidal Thoughts:  No  Judgement:  Fair  Insight:  Fair  Concentration: good  Memory: Immediate-fair Recent-fair Remote-fair  Recall: fair  Language: fair  Gait and Station: normal  [employer] of Knowledge: average  Psychomotor Activity:  Normal walking slow with a cane. No visible tics.   Akathisia:  No  Handed:  Right  AIMS (if indicated):  Facial and Oral Movements  Muscles of Facial Expression: None, normal  Lips and Perioral Area: None, normal  Jaw: None, normal  Tongue: None, normal Extremity Movements: Upper (arms, wrists, hands, fingers): None, normal  Lower (legs, knees, ankles, toes): None, normal,  Trunk Movements:  Neck, shoulders, hips: None, normal,  Overall Severity : Severity of abnormal movements (highest score from questions above): None, normal  Incapacitation due to abnormal movements: None, normal  Patient's awareness of abnormal movements (rate only patient's report): No Awareness, Dental Status  Current problems with teeth and/or dentures?: No  Does patient usually wear dentures?: No    Assets:  Communication Skills Desire for Improvement Housing Administrator, Civil Service  (Choose Three): Established Problem, Stable/Improving (1), Review of Psycho-Social Stressors (1), Established Problem, Worsening (2), Review of Medication Regimen & Side Effects (2) and Review of New Medication or Change in Dosage (2)   Assessment: AXIS I : Major Depression, Recurrent severe without psychotic features and Tourettes; Anxiety AXIS II : Deferred      Treatment Plan/Recommendations:  Plan of Care:  Medication management with supportive therapy. Risks/benefits and SE of the medication discussed. Pt verbalized understanding and verbal consent obtained for treatment.  Affirm with the patient that the medications are taken as ordered. Patient expressed understanding of how their medications were  to be used.     Laboratory: reviewed EKG 01/27/2014 QTc 406, NSR;   Labs WNL and scanned into chart   Psychotherapy: Therapy: brief supportive therapy provided. Discussed psychosocial stressors in detail.     Discussed [place] as a possibility in the future. She is concerned about finding transportation.   Medications: Abilify 20mg  daily for mood augmentation and tics  -Wellbutrin XR to 300mg  for depression. Higher doses associated with hypertension -Vistaril 10mg  po TID prn anxiety -declined changes to meds today Due to untreated sleep apnea pt advised not to use sedating meds at bedtime such as Clonazepam and pain meds.   Routine PRN Medications: No  Consultations: encouraged to continue appts with therapist  Safety Concerns: Pt denies SI and is at an acute low risk for suicide.Patient told to call clinic if any problems occur. Patient advised to go to ER if they should develop SI/HI, side effects, or if symptoms worsen. Has crisis numbers to call if needed. Pt verbalized understanding.   Other: F/up in 3 months or sooner if needed     Charlcie Cradle, MD 10/09/2015

## 2015-10-15 ENCOUNTER — Ambulatory Visit: Payer: Self-pay | Admitting: Allergy and Immunology

## 2015-10-15 DIAGNOSIS — I878 Other specified disorders of veins: Secondary | ICD-10-CM | POA: Insufficient documentation

## 2015-10-22 ENCOUNTER — Encounter: Payer: Self-pay | Admitting: Allergy and Immunology

## 2015-10-22 ENCOUNTER — Ambulatory Visit (INDEPENDENT_AMBULATORY_CARE_PROVIDER_SITE_OTHER): Payer: BLUE CROSS/BLUE SHIELD | Admitting: Allergy and Immunology

## 2015-10-22 VITALS — BP 132/84 | HR 76 | Resp 20

## 2015-10-22 DIAGNOSIS — J309 Allergic rhinitis, unspecified: Secondary | ICD-10-CM | POA: Diagnosis not present

## 2015-10-22 DIAGNOSIS — H1010 Acute atopic conjunctivitis, unspecified eye: Secondary | ICD-10-CM

## 2015-10-22 DIAGNOSIS — J387 Other diseases of larynx: Secondary | ICD-10-CM

## 2015-10-22 DIAGNOSIS — L719 Rosacea, unspecified: Secondary | ICD-10-CM

## 2015-10-22 DIAGNOSIS — K219 Gastro-esophageal reflux disease without esophagitis: Secondary | ICD-10-CM

## 2015-10-22 DIAGNOSIS — J4530 Mild persistent asthma, uncomplicated: Secondary | ICD-10-CM | POA: Diagnosis not present

## 2015-10-22 MED ORDER — ALBUTEROL SULFATE HFA 108 (90 BASE) MCG/ACT IN AERS: INHALATION_SPRAY | RESPIRATORY_TRACT | Status: DC

## 2015-10-22 MED ORDER — MOMETASONE FUROATE 50 MCG/ACT NA SUSP: NASAL | Status: DC

## 2015-10-22 MED ORDER — BECLOMETHASONE DIPROPIONATE 80 MCG/ACT IN AERS: INHALATION_SPRAY | RESPIRATORY_TRACT | Status: DC

## 2015-10-22 NOTE — Progress Notes (Signed)
Follow-up Note  Referring Provider: Raina Mina., MD Primary Provider: Gilford Rile, MD Date of Office Visit: 10/22/2015  Subjective:   Penny Porter (DOB: 05/08/1966) is a 50 y.o. female who returns to the [facility] on 10/22/2015 in re-evaluation of the following:  HPI Comments: Narjes returns to this clinic in reevaluation of her asthma, allergic rhinoconjunctivitis, LPR, and rosacea. I've not seen her in his clinic in over 6 months. She is done excellent regarding her atopic respiratory disease and rarely uses a short acting bronchodilator. She is done so well that she's taper off her Qvar over the course of the past several months. She did have 2 days last week where she had some coughing and had use her bronchodilator but otherwise everything else is been stable. She is also had a little bit more sneezing the past several weeks. She's not using any Nasonex. Her reflux is under very good control while using her AcipHex and ranitidine combination. She is now down to using azithromycin 3 times per week for her rosacea which is working quite well.   Outpatient Prescriptions Prior to Visit  Medication Sig Dispense Refill  . albuterol (PROVENTIL HFA;VENTOLIN HFA) 108 (90 BASE) MCG/ACT inhaler Inhale into the lungs every 6 (six) hours as needed for wheezing or shortness of breath.    . ARIPiprazole (ABILIFY) 20 MG tablet Take 1 tablet (20 mg total) by mouth daily. 90 tablet 0  . aspirin 81 MG tablet Take 81 mg by mouth daily.    Marland Kitchen azelastine (ASTELIN) 0.1 % nasal spray Place 2 sprays into both nostrils 2 (two) times daily. Use in each nostril as directed    . buPROPion (WELLBUTRIN XL) 300 MG 24 hr tablet Take 1 tablet (300 mg total) by mouth every morning. 90 tablet 0  . cetirizine (ZYRTEC) 10 MG tablet Take 10 mg by mouth daily.    . Cholecalciferol (VITAMIN D-3) 5000 UNITS TABS Take 1 tablet by mouth daily.    . diclofenac sodium (VOLTAREN) 1 % GEL Apply 1 g topically 2  (two) times daily.    Marland Kitchen dicyclomine (BENTYL) 10 MG capsule Take 10 mg by mouth 3 (three) times daily before meals.    . felodipine (PLENDIL) 5 MG 24 hr tablet Take 7.5 mg by mouth daily. Reported on 10/09/2015    . furosemide (LASIX) 20 MG tablet 20 mg. Take 1-2 tabs po up to twice a day as needed for edema    . hydrOXYzine (ATARAX/VISTARIL) 10 MG tablet Take 1 tablet (10 mg total) by mouth 3 (three) times daily as needed for anxiety. 270 tablet 0  . ketoconazole (NIZORAL) 2 % cream Apply 1 application topically daily.    Marland Kitchen ketoconazole (NIZORAL) 2 % shampoo Apply 1 application topically 2 (two) times a week.    . lubiprostone (AMITIZA) 24 MCG capsule Take 24 mcg by mouth 2 (two) times daily with a meal.    . metoprolol (LOPRESSOR) 50 MG tablet Take 50 mg by mouth daily.    . metroNIDAZOLE (METROCREAM) 0.75 % cream Apply topically 2 (two) times daily.    . mometasone (NASONEX) 50 MCG/ACT nasal spray Place 2 sprays into the nose daily.    Marland Kitchen olmesartan (BENICAR) 20 MG tablet Take 20 mg by mouth daily.    . ondansetron (ZOFRAN-ODT) 4 MG disintegrating tablet Take 4 mg by mouth 2 (two) times daily as needed for nausea or vomiting.    . pregabalin (LYRICA) 75 MG capsule Take 75  mg by mouth 2 (two) times daily. Reported on 10/09/2015    . RABEprazole (ACIPHEX) 20 MG tablet Take 20 mg by mouth daily.    . ranitidine (ZANTAC) 300 MG tablet Take 300 mg by mouth at bedtime.    Marland Kitchen spironolactone (ALDACTONE) 25 MG tablet Take 25 mg by mouth daily.    . traMADol (ULTRAM) 50 MG tablet Take by mouth. 1-2 tabs po q6hr as needed for pain    . triamcinolone cream (KENALOG) 0.1 % Apply 1 application topically 2 (two) times daily.    . vitamin B-12 (CYANOCOBALAMIN) 1000 MCG tablet Take 1,000 mcg by mouth daily.    . beclomethasone (QVAR) 80 MCG/ACT inhaler Inhale into the lungs 2 (two) times daily.    Marland Kitchen Antiseborrheic Products, Misc. (PROMISEB EX) Apply topically.    . cyclobenzaprine (FLEXERIL) 10 MG tablet Take 10  mg by mouth. Reported on 10/09/2015    . diclofenac (FLECTOR) 1.3 % PTCH Place 1 patch onto the skin 2 (two) times daily. Reported on 10/09/2015    . Diclofenac (ZORVOLEX) 18 MG CAPS Take 18 mg by mouth daily. Reported on 10/09/2015    . erythromycin (E-MYCIN) 250 MG tablet Take 250 mg by mouth daily.    . fexofenadine (ALLEGRA) 180 MG tablet Take 180 mg by mouth daily. Reported on 10/09/2015    . HYDROcodone-acetaminophen (NORCO) 7.5-325 MG per tablet Take 1 tablet by mouth every 6 (six) hours as needed for moderate pain. Reported on 10/09/2015    . Linaclotide (LINZESS) 290 MCG CAPS capsule Take 290 mcg by mouth daily. Reported on 10/09/2015    . medroxyPROGESTERone (PROVERA) 10 MG tablet Take 10 mg by mouth daily. Reported on 10/09/2015    . predniSONE (STERAPRED UNI-PAK) 10 MG tablet Take by mouth daily. Reported on 10/09/2015    . pregabalin (LYRICA) 50 MG capsule Take 50 mg by mouth. Reported on 10/09/2015    . vitamin E 400 UNIT capsule Take 400 Units by mouth daily. Reported on 10/09/2015     No facility-administered medications prior to visit.    Past Medical History  Diagnosis Date  . Asthma   . Depression   . GERD (gastroesophageal reflux disease)   . Hypertension   . Tourette disease   . Gastroparesis   . Trigeminal nerve disease   . Costochondral chest pain   . Arthritis     Back and hip  . Stenosis of lumbosacral spine   . Degenerative disk disease   . Bursitis   . Thyroid nodule   . Sleep apnea     not using CPAP    Past Surgical History  Procedure Laterality Date  . Cholecystectomy  2005  . Ruptured disk  2008  . Tear duct probing  2009  . Elbow surgery Left May 29, 2014  . Polyp removal    . Bone spur removal on back    . Back surgery  September 19, 2015    Allergies  Allergen Reactions  . Gadoversetamide Hives  . Avelox [Moxifloxacin Hcl In Nacl] Other (See Comments)    Caused pain in shoulders to finger, tendonitis  . Moxifloxacin Other (See Comments)     Caused pain in shoulders to finger, tendonitis  . Amoxicillin Hives  . Bextra [Valdecoxib] Hives and Swelling  . Brintellix [Vortioxetine] Nausea And Vomiting  . Doxycycline Hyclate Hives and Swelling  . Fetzima [Levomilnacipran]     Tremors across head   . Gadobutrol Hives  . Gadolinium Derivatives Hives  .  Iodinated Diagnostic Agents   . Lamictal [Lamotrigine] Hives  . Naproxen Hypertension  . Nitrofurantoin Other (See Comments)  . Percolone [Oxycodone] Hives  . Relafen [Nabumetone] Hypertension  . Risperdal [Risperidone]     Leg weakness     Review of systems negative except as noted in HPI / PMHx or noted below:  Review of Systems  Constitutional: Negative.   HENT: Negative.   Eyes: Negative.   Respiratory: Negative.   Cardiovascular: Negative.   Gastrointestinal: Negative.   Genitourinary: Negative.   Musculoskeletal: Negative.   Skin: Negative.   Neurological: Negative.   Endo/Heme/Allergies: Negative.   Psychiatric/Behavioral: Negative.      Objective:   Filed Vitals:   10/22/15 1524  BP: 132/84  Pulse: 76  Resp: 20          Physical Exam  Constitutional: She is well-developed, well-nourished, and in no distress.  HENT:  Head: Normocephalic.  Right Ear: Tympanic membrane, external ear and ear canal normal.  Left Ear: Tympanic membrane, external ear and ear canal normal.  Nose: Nose normal. No mucosal edema or rhinorrhea.  Mouth/Throat: Uvula is midline, oropharynx is clear and moist and mucous membranes are normal. No oropharyngeal exudate.  Eyes: Conjunctivae are normal.  Neck: Trachea normal. No tracheal tenderness present. No tracheal deviation present. No thyromegaly present.  Cardiovascular: Normal rate, regular rhythm, S1 normal, S2 normal and normal heart sounds.   No murmur heard. Pulmonary/Chest: Breath sounds normal. No stridor. No respiratory distress. She has no wheezes. She has no rales.  Musculoskeletal: She exhibits no edema.    Lymphadenopathy:       Head (right side): No tonsillar adenopathy present.       Head (left side): No tonsillar adenopathy present.    She has no cervical adenopathy.    She has no axillary adenopathy.  Neurological: She is alert. Gait normal.  Skin: No rash noted. She is not diaphoretic. No erythema. Nails show no clubbing.  Psychiatric: Mood and affect normal.    Diagnostics:    Spirometry was performed and demonstrated an FEV1 of 2.17 at 66 % of predicted.  The patient had an Asthma Control Test with the following results:  .    Assessment and Plan:   1. Asthma, well controlled, mild persistent   2. Allergic rhinoconjunctivitis   3. LPRD (laryngopharyngeal reflux disease)   4. Rosacea     1. Continue azithromycin 250 mg 3 days a week  2. Continue Qvar 80 one inhalation 1 time per day and increase to 3 inhalations 3 times per day as part of action plan for asthma flare  3. Continue Nasonex or OTC Rhinocort one spray each nostril one time per day  4. Continue AcipHex 20 mg in the morning and ranitidine 300 mg in the evening  5. Continue ProAir HFA and antihistamine if needed  6. Return to clinic in 6 months or earlier if problem  Fayetta is doing quite well at this point in time and I see very little need for changing around her medications. I did encourage her to use Qvar and Nasonex pretty consistently as we go through this upcoming springtime season. She'll continue on AcipHex and ranitidine which appears to be working quite well for her reflux. I'll see her back in this clinic in 6 months or earlier if there is a problem  Allena Katz, MD [place]

## 2015-10-22 NOTE — Patient Instructions (Signed)
  1. Continue azithromycin 250 mg 3 days a week  2. Continue Qvar 80 one inhalation 1 time per day and increase to 3 inhalations 3 times per day as part of action plan for asthma flare  3. Continue Nasonex or OTC Rhinocort one spray each nostril one time per day  4. Continue AcipHex 20 mg in the morning and ranitidine 300 mg in the evening  5. Continue ProAir HFA and antihistamine if needed  6. Return to clinic in 6 months or earlier if problem

## 2015-10-25 ENCOUNTER — Telehealth: Payer: Self-pay

## 2015-10-25 NOTE — Telephone Encounter (Signed)
Please inform patient that she can add prednisone 10 mg once a day for 5 days to her current plan.

## 2015-10-25 NOTE — Telephone Encounter (Signed)
Patient is having trouble taking a deep breath. Started last night. I told her to increase her QVAR to 3 puffs TID and use ProAir HFA. Patient wants to know if there is anything else she can do? Please advise.

## 2015-10-25 NOTE — Telephone Encounter (Signed)
Spoke with pt she will come by and pick up some prednisone

## 2015-10-29 ENCOUNTER — Ambulatory Visit (INDEPENDENT_AMBULATORY_CARE_PROVIDER_SITE_OTHER): Payer: BLUE CROSS/BLUE SHIELD | Admitting: Allergy and Immunology

## 2015-10-29 ENCOUNTER — Encounter: Payer: Self-pay | Admitting: Allergy and Immunology

## 2015-10-29 VITALS — BP 150/108 | HR 88 | Resp 18

## 2015-10-29 DIAGNOSIS — L719 Rosacea, unspecified: Secondary | ICD-10-CM

## 2015-10-29 DIAGNOSIS — J4531 Mild persistent asthma with (acute) exacerbation: Secondary | ICD-10-CM | POA: Diagnosis not present

## 2015-10-29 DIAGNOSIS — J309 Allergic rhinitis, unspecified: Secondary | ICD-10-CM

## 2015-10-29 DIAGNOSIS — J387 Other diseases of larynx: Secondary | ICD-10-CM

## 2015-10-29 DIAGNOSIS — H1010 Acute atopic conjunctivitis, unspecified eye: Secondary | ICD-10-CM | POA: Diagnosis not present

## 2015-10-29 DIAGNOSIS — K219 Gastro-esophageal reflux disease without esophagitis: Secondary | ICD-10-CM

## 2015-10-29 NOTE — Patient Instructions (Signed)
  1. Continue azithromycin 250 mg 3 days a week  2. Continue Qvar 80 one inhalation 1 time per day and increase to 3 inhalations 3 times per day as part of action plan for asthma flare  3. Continue Nasonex or OTC Rhinocort one spray each nostril one time per day  4. Continue AcipHex 20 mg in the morning and ranitidine 300 mg in the evening  5. Continue ProAir HFA and antihistamine if needed  6. Finish out prednisone  7. Return to clinic in 6 months or earlier if problem

## 2015-10-29 NOTE — Progress Notes (Signed)
Follow-up Note  Referring Provider: Raina Mina., MD Primary Provider: Gilford Rile, MD Date of Office Visit: 10/29/2015  Subjective:   Penny Porter (DOB: [birth year]) is a 50 y.o. female who returns to the [facility] on 10/29/2015 in re-evaluation of the following:  HPI Comments: Penny Porter presents to this clinic with complaints of nasal congestion and sneezing and inability to breathe very well along with some coughing and wheezing for which she contacted me by telephone at the end of last week and I gave her a very low dose of prednisone which was ineffective in alleviating her symptoms and she subsequently talked to our on call doctor who gave her a higher dose of prednisone which is raised her blood pressure somewhat and is given her flushed face. She is not had any fever and has not had any ugly nasal discharge or ugly sputum production or chest pain. She has used her short acting bronchodilator which helps her somewhat.   Outpatient Prescriptions Prior to Visit  Medication Sig Dispense Refill  . albuterol (PROAIR HFA) 108 (90 Base) MCG/ACT inhaler Inhale two puffs every four to six hours as needed for cough or wheeze. 3 Inhaler 0  . albuterol (PROVENTIL HFA;VENTOLIN HFA) 108 (90 BASE) MCG/ACT inhaler Inhale into the lungs every 6 (six) hours as needed for wheezing or shortness of breath.    . [place], Misc. (PROMISEB EX) Apply topically.    . ARIPiprazole (ABILIFY) 20 MG tablet Take 1 tablet (20 mg total) by mouth daily. 90 tablet 0  . aspirin 81 MG tablet Take 81 mg by mouth daily.    Marland Kitchen azelastine (ASTELIN) 0.1 % nasal spray Place 2 sprays into both nostrils 2 (two) times daily. Use in each nostril as directed    . azithromycin (ZITHROMAX) 250 MG tablet Take 250 mg by mouth 3 (three) times a week.     . beclomethasone (QVAR) 80 MCG/ACT inhaler Inhale 1 puff once daily to prevent cough or wheeze.  Rinse, gargle, and spit after use. Increase to 3 puffs 3  times daily during flare-up. 3 Inhaler 1  . BENICAR 40 MG tablet   4  . buPROPion (WELLBUTRIN XL) 300 MG 24 hr tablet Take 1 tablet (300 mg total) by mouth every morning. 90 tablet 0  . cetirizine (ZYRTEC) 10 MG tablet Take 10 mg by mouth daily.    . Cholecalciferol (VITAMIN D-3) 5000 UNITS TABS Take 1 tablet by mouth daily.    . cyclobenzaprine (FLEXERIL) 10 MG tablet Take 10 mg by mouth 3 (three) times daily.  0  . diclofenac sodium (VOLTAREN) 1 % GEL Apply 1 g topically 2 (two) times daily.    Marland Kitchen dicyclomine (BENTYL) 10 MG capsule Take 10 mg by mouth 3 (three) times daily before meals.    . felodipine (PLENDIL) 5 MG 24 hr tablet Take 2.5 mg by mouth daily. Reported on 10/09/2015    . furosemide (LASIX) 20 MG tablet 20 mg. Take 1-2 tabs po up to twice a day as needed for edema    . HYDROmorphone (DILAUDID) 2 MG tablet   0  . hydrOXYzine (ATARAX/VISTARIL) 10 MG tablet Take 1 tablet (10 mg total) by mouth 3 (three) times daily as needed for anxiety. 270 tablet 0  . ketoconazole (NIZORAL) 2 % cream Apply 1 application topically daily.    Marland Kitchen ketoconazole (NIZORAL) 2 % shampoo Apply 1 application topically 2 (two) times a week.    . lubiprostone (AMITIZA) 24 MCG  capsule Take 24 mcg by mouth 2 (two) times daily with a meal.    . meloxicam (MOBIC) 7.5 MG tablet Take 7.5 mg by mouth 2 (two) times daily.  0  . metoprolol (LOPRESSOR) 50 MG tablet Take 50 mg by mouth daily.    . metoprolol succinate (TOPROL-XL) 50 MG 24 hr tablet     . metroNIDAZOLE (METROCREAM) 0.75 % cream Apply topically 2 (two) times daily.    . mometasone (NASONEX) 50 MCG/ACT nasal spray Place 2 sprays into the nose daily.    . mometasone (NASONEX) 50 MCG/ACT nasal spray Use one spray in each nostril once daily as directed. 51 g 1  . olmesartan (BENICAR) 20 MG tablet Take 20 mg by mouth daily.    . ondansetron (ZOFRAN-ODT) 4 MG disintegrating tablet Take 4 mg by mouth 2 (two) times daily as needed for nausea or vomiting.    .  pregabalin (LYRICA) 75 MG capsule Take 75 mg by mouth 2 (two) times daily. Reported on 10/09/2015    . RABEprazole (ACIPHEX) 20 MG tablet Take 20 mg by mouth daily.    . ranitidine (ZANTAC) 300 MG tablet Take 300 mg by mouth at bedtime.    Marland Kitchen spironolactone (ALDACTONE) 25 MG tablet Take 25 mg by mouth daily.    . traMADol (ULTRAM) 50 MG tablet Take by mouth. 1-2 tabs po q6hr as needed for pain    . triamcinolone cream (KENALOG) 0.1 % Apply 1 application topically 2 (two) times daily.    . vitamin B-12 (CYANOCOBALAMIN) 1000 MCG tablet Take 1,000 mcg by mouth daily.     No facility-administered medications prior to visit.    Past Medical History  Diagnosis Date  . Asthma   . Depression   . GERD (gastroesophageal reflux disease)   . Hypertension   . Tourette disease   . Gastroparesis   . Trigeminal nerve disease   . Costochondral chest pain   . Arthritis     Back and hip  . Stenosis of lumbosacral spine   . Degenerative disk disease   . Bursitis   . Thyroid nodule   . Sleep apnea     not using CPAP    Past Surgical History  Procedure Laterality Date  . Cholecystectomy  2005  . Ruptured disk  2008  . Tear duct probing  2009  . Elbow surgery Left May 29, 2014  . Polyp removal    . Bone spur removal on back    . Back surgery  September 19, 2015    Allergies  Allergen Reactions  . Gadoversetamide Hives  . Avelox [Moxifloxacin Hcl In Nacl] Other (See Comments)    Caused pain in shoulders to finger, tendonitis  . Moxifloxacin Other (See Comments)    Caused pain in shoulders to finger, tendonitis  . Amoxicillin Hives  . Bextra [Valdecoxib] Hives and Swelling  . Brintellix [Vortioxetine] Nausea And Vomiting  . Doxycycline Hyclate Hives and Swelling  . Fetzima [Levomilnacipran]     Tremors across head   . Gadobutrol Hives  . Gadolinium Derivatives Hives  . Iodinated Diagnostic Agents   . Lamictal [Lamotrigine] Hives  . Naproxen Hypertension  . Nitrofurantoin Other (See  Comments)  . Percolone [Oxycodone] Hives  . Relafen [Nabumetone] Hypertension  . Risperdal [Risperidone]     Leg weakness     Review of systems negative except as noted in HPI / PMHx or noted below:  Review of Systems  Constitutional: Negative.   HENT: Negative.  Eyes: Negative.   Respiratory: Negative.   Cardiovascular: Negative.   Gastrointestinal: Negative.   Genitourinary: Negative.   Musculoskeletal: Negative.   Skin: Negative.   Neurological: Negative.   Endo/Heme/Allergies: Negative.   Psychiatric/Behavioral: Negative.      Objective:   Filed Vitals:   10/29/15 1636  BP: 150/108  Pulse: 88  Resp: 18          Physical Exam  Constitutional: She is well-developed, well-nourished, and in no distress.  HENT:  Head: Normocephalic.  Right Ear: Tympanic membrane, external ear and ear canal normal.  Left Ear: Tympanic membrane, external ear and ear canal normal.  Nose: Nose normal. No mucosal edema or rhinorrhea.  Mouth/Throat: Uvula is midline, oropharynx is clear and moist and mucous membranes are normal. No oropharyngeal exudate.  Eyes: Conjunctivae are normal.  Neck: Trachea normal. No tracheal tenderness present. No tracheal deviation present. No thyromegaly present.  Cardiovascular: Normal rate, regular rhythm, S1 normal, S2 normal and normal heart sounds.   No murmur heard. Pulmonary/Chest: Breath sounds normal. No stridor. No respiratory distress. She has no wheezes. She has no rales.  Musculoskeletal: She exhibits no edema.  Lymphadenopathy:       Head (right side): No tonsillar adenopathy present.       Head (left side): No tonsillar adenopathy present.    She has no cervical adenopathy.    She has no axillary adenopathy.  Neurological: She is alert. Gait normal.  Skin: Rash (Facial erythema and telangiectasia) noted. She is not diaphoretic. No erythema. Nails show no clubbing.  Psychiatric: Mood and affect normal.    Diagnostics:    Spirometry  was performed and demonstrated an FEV1 of 1.36 at 41 % of predicted.  The patient had an Asthma Control Test with the following results:  .    Assessment and Plan:   1. Asthma, not well controlled, mild persistent, with acute exacerbation   2. Allergic rhinoconjunctivitis   3. LPRD (laryngopharyngeal reflux disease)   4. Rosacea     1. Continue azithromycin 250 mg 3 days a week  2. Continue Qvar 80 one inhalation 1 time per day and increase to 3 inhalations 3 times per day as part of action plan for asthma flare  3. Continue Nasonex or OTC Rhinocort one spray each nostril one time per day  4. Continue AcipHex 20 mg in the morning and ranitidine 300 mg in the evening  5. Continue ProAir HFA and antihistamine if needed  6. Finish out prednisone  7. Return to clinic in 6 months or earlier if problem  I suspect Tyshara probably contracted a rhinovirus which is limited to inflammation of her respiratory tract and the symptoms that she describes and I will hold off on any further evaluation or treatment except to have her finish out her current dose of prednisone. She is entering into day 5- 7 of this episode and hopefully she will now begin to improve as we move forward. Certainly she develop significant problems in the face of this approach she will require further evaluation and treatment. She'll keep in contact with me noting her response.  Allena Katz, MD [place]

## 2015-11-14 ENCOUNTER — Other Ambulatory Visit: Payer: Self-pay | Admitting: Allergy and Immunology

## 2015-11-14 ENCOUNTER — Other Ambulatory Visit: Payer: Self-pay

## 2015-11-14 MED ORDER — AZITHROMYCIN 250 MG PO TABS: 250.0000 mg | ORAL_TABLET | ORAL | Status: DC

## 2015-11-14 MED ORDER — AZITHROMYCIN 250 MG PO TABS: ORAL_TABLET | ORAL | Status: DC

## 2015-11-30 ENCOUNTER — Encounter: Payer: Self-pay | Admitting: Allergy and Immunology

## 2015-11-30 ENCOUNTER — Ambulatory Visit (INDEPENDENT_AMBULATORY_CARE_PROVIDER_SITE_OTHER): Payer: BLUE CROSS/BLUE SHIELD | Admitting: Allergy and Immunology

## 2015-11-30 VITALS — BP 110/82 | HR 88 | Resp 16

## 2015-11-30 DIAGNOSIS — H1010 Acute atopic conjunctivitis, unspecified eye: Secondary | ICD-10-CM | POA: Diagnosis not present

## 2015-11-30 DIAGNOSIS — J309 Allergic rhinitis, unspecified: Secondary | ICD-10-CM

## 2015-11-30 DIAGNOSIS — K224 Dyskinesia of esophagus: Secondary | ICD-10-CM

## 2015-11-30 DIAGNOSIS — K219 Gastro-esophageal reflux disease without esophagitis: Secondary | ICD-10-CM

## 2015-11-30 DIAGNOSIS — J387 Other diseases of larynx: Secondary | ICD-10-CM

## 2015-11-30 DIAGNOSIS — L719 Rosacea, unspecified: Secondary | ICD-10-CM

## 2015-11-30 DIAGNOSIS — J4531 Mild persistent asthma with (acute) exacerbation: Secondary | ICD-10-CM

## 2015-11-30 NOTE — Patient Instructions (Signed)
  1. Continue azithromycin 250 mg 3 days a week  2. Consistently use Qvar 80 two inhalation 2 time per day and increase to 3 inhalations 3 times per day as part of action plan for asthma flare  3. Continue Nasonex or OTC Rhinocort one spray each nostril one time per day  4. Continue AcipHex 20 mg in the morning and ranitidine 300 mg in the evening  5. Continue ProAir HFA and antihistamine if needed  6. Appointment with Dr. Lyndel Safe as soon as possible  7. Return to clinic in 6 months or earlier if problem

## 2015-11-30 NOTE — Progress Notes (Signed)
Follow-up Note  Referring Provider: Raina Mina., MD Primary Provider: Gilford Rile, MD Date of Office Visit: 11/30/2015  Subjective:   Penny Porter (DOB: [birth year]) is a 50 y.o. female who returns to the [facility] on 11/30/2015 in re-evaluation of the following:  HPI: Nela presents this clinic in evaluation of breathing problems. She " just can't take it deep breath". When she attempted to take a deep breath she just can't find that area goes into her chest. She doesn't really have a tremendous amount of wheezing or coughing. She is seen Dr. Darcel Bayley office and she has seen Greig Castilla and has been administered high doses of steroids, home nebulizer, and given Dulera none of which has helped her. She has had a chest x-ray, she has had a chest CT scan, both of which have been normal. When she uses the nebulized albuterol if it does not help her.  Interestingly, she has had more problems with swallowing recently. She has very slow esophageal transit and food gets hung up and she needs to drink water to pass food down into her stomach. She did see Dr. Lyndel Safe many years ago and had an upper endoscopy but she can't remember the results of the endoscopy. She continues to treat reflux with AcipHex and ranitidine.    Medication List           albuterol 108 (90 Base) MCG/ACT inhaler  Commonly known as:  PROVENTIL HFA;VENTOLIN HFA  Inhale into the lungs every 6 (six) hours as needed for wheezing or shortness of breath.     albuterol 108 (90 Base) MCG/ACT inhaler  Commonly known as:  PROAIR HFA  Inhale two puffs every four to six hours as needed for cough or wheeze.     albuterol (2.5 MG/3ML) 0.083% nebulizer solution  Commonly known as:  PROVENTIL  PLACE 1 VIAL IN NEBULIZER EVERY 6 HOURS AS NEEDED FOR WHEEZING UP TO 30 DAYS     ARIPiprazole 20 MG tablet  Commonly known as:  ABILIFY  Take 1 tablet (20 mg total) by mouth daily.     aspirin 81 MG tablet  Take 81 mg  by mouth daily.     azelastine 0.1 % nasal spray  Commonly known as:  ASTELIN  Place 2 sprays into both nostrils 2 (two) times daily. Use in each nostril as directed     azithromycin 250 MG tablet  Commonly known as:  ZITHROMAX  Take one tablet by mouth three times a week.     beclomethasone 80 MCG/ACT inhaler  Commonly known as:  QVAR  Inhale 1 puff once daily to prevent cough or wheeze.  Rinse, gargle, and spit after use. Increase to 3 puffs 3 times daily during flare-up.     buPROPion 300 MG 24 hr tablet  Commonly known as:  WELLBUTRIN XL  Take 1 tablet (300 mg total) by mouth every morning.     cetirizine 10 MG tablet  Commonly known as:  ZYRTEC  Take 10 mg by mouth daily.     cyclobenzaprine 10 MG tablet  Commonly known as:  FLEXERIL  Take 10 mg by mouth 3 (three) times daily.     dicyclomine 10 MG capsule  Commonly known as:  BENTYL  Take 10 mg by mouth 3 (three) times daily before meals.     DULERA 100-5 MCG/ACT Aero  Generic drug:  mometasone-formoterol  Inhale 2 puffs into the lungs 2 (two) times daily.     felodipine  5 MG 24 hr tablet  Commonly known as:  PLENDIL  Take 2.5 mg by mouth daily. Reported on 10/09/2015     furosemide 20 MG tablet  Commonly known as:  LASIX  20 mg. Take 1-2 tabs po up to twice a day as needed for edema     HYDROmorphone 2 MG tablet  Commonly known as:  DILAUDID     hydrOXYzine 10 MG tablet  Commonly known as:  ATARAX/VISTARIL  Take 1 tablet (10 mg total) by mouth 3 (three) times daily as needed for anxiety.     ketoconazole 2 % shampoo  Commonly known as:  NIZORAL  Apply 1 application topically 2 (two) times a week.     ketoconazole 2 % cream  Commonly known as:  NIZORAL  Apply 1 application topically daily.     lubiprostone 24 MCG capsule  Commonly known as:  AMITIZA  Take 24 mcg by mouth 2 (two) times daily with a meal.     meloxicam 7.5 MG tablet  Commonly known as:  MOBIC  Take 7.5 mg by mouth 2 (two) times daily.      metoprolol 50 MG tablet  Commonly known as:  LOPRESSOR  Take 50 mg by mouth daily.     metoprolol succinate 50 MG 24 hr tablet  Commonly known as:  TOPROL-XL     metroNIDAZOLE 0.75 % cream  Commonly known as:  METROCREAM  Apply topically 2 (two) times daily.     mometasone 50 MCG/ACT nasal spray  Commonly known as:  NASONEX  Place 2 sprays into the nose daily.     mometasone 50 MCG/ACT nasal spray  Commonly known as:  NASONEX  Use one spray in each nostril once daily as directed.     olmesartan 20 MG tablet  Commonly known as:  BENICAR  Take 20 mg by mouth daily.     BENICAR 40 MG tablet  Generic drug:  olmesartan     ondansetron 4 MG disintegrating tablet  Commonly known as:  ZOFRAN-ODT  Take 4 mg by mouth 2 (two) times daily as needed for nausea or vomiting.     predniSONE 10 MG tablet  Commonly known as:  DELTASONE     pregabalin 75 MG capsule  Commonly known as:  LYRICA  Take 75 mg by mouth 2 (two) times daily. Reported on 10/09/2015     PROMISEB EX  Apply topically.     RABEprazole 20 MG tablet  Commonly known as:  ACIPHEX  Take 20 mg by mouth daily.     ranitidine 300 MG tablet  Commonly known as:  ZANTAC  Take 300 mg by mouth at bedtime.     spironolactone 25 MG tablet  Commonly known as:  ALDACTONE  Take 25 mg by mouth daily.     traMADol 50 MG tablet  Commonly known as:  ULTRAM  Take by mouth. 1-2 tabs po q6hr as needed for pain     triamcinolone cream 0.1 %  Commonly known as:  KENALOG  Apply 1 application topically 2 (two) times daily.     vitamin B-12 1000 MCG tablet  Commonly known as:  CYANOCOBALAMIN  Take 1,000 mcg by mouth daily.     Vitamin D-3 5000 UNITS Tabs  Take 1 tablet by mouth daily.     VOLTAREN 1 % Gel  Generic drug:  diclofenac sodium  Apply 1 g topically 2 (two) times daily.        Past Medical History  Diagnosis Date  . Asthma   . Depression   . GERD (gastroesophageal reflux disease)   . Hypertension   .  Tourette disease   . Gastroparesis   . Trigeminal nerve disease   . Costochondral chest pain   . Arthritis     Back and hip  . Stenosis of lumbosacral spine   . Degenerative disk disease   . Bursitis   . Thyroid nodule   . Sleep apnea     not using CPAP    Past Surgical History  Procedure Laterality Date  . Cholecystectomy  2005  . Ruptured disk  2008  . Tear duct probing  2009  . Elbow surgery Left May 29, 2014  . Polyp removal    . Bone spur removal on back    . Back surgery  September 19, 2015    Allergies  Allergen Reactions  . Gadoversetamide Hives  . Avelox [Moxifloxacin Hcl In Nacl] Other (See Comments)    Caused pain in shoulders to finger, tendonitis  . Moxifloxacin Other (See Comments)    Caused pain in shoulders to finger, tendonitis  . Amoxicillin Hives  . Bextra [Valdecoxib] Hives and Swelling  . Brintellix [Vortioxetine] Nausea And Vomiting  . Doxycycline Hyclate Hives and Swelling  . Fetzima [Levomilnacipran]     Tremors across head   . Gadobutrol Hives  . Gadolinium Derivatives Hives  . Iodinated Diagnostic Agents   . Lamictal [Lamotrigine] Hives  . Naproxen Hypertension  . Nitrofurantoin Other (See Comments)  . Percolone [Oxycodone] Hives  . Relafen [Nabumetone] Hypertension  . Risperdal [Risperidone]     Leg weakness     Review of systems negative except as noted in HPI / PMHx or noted below:  Review of Systems  Constitutional: Negative.   HENT: Negative.   Eyes: Negative.   Respiratory: Negative.   Cardiovascular: Negative.   Gastrointestinal: Negative.   Genitourinary: Negative.   Musculoskeletal: Negative.   Skin: Negative.        Rosacea treated with 3 times a week azithromycin  Neurological: Negative.   Endo/Heme/Allergies: Negative.   Psychiatric/Behavioral: Negative.      Objective:   Filed Vitals:   11/30/15 1035  BP: 110/82  Pulse: 88  Resp: 16          Physical Exam  Constitutional: She is well-developed,  well-nourished, and in no distress.  HENT:  Head: Normocephalic.  Right Ear: Tympanic membrane, external ear and ear canal normal.  Left Ear: Tympanic membrane, external ear and ear canal normal.  Nose: Nose normal. No mucosal edema or rhinorrhea.  Mouth/Throat: Uvula is midline, oropharynx is clear and moist and mucous membranes are normal. No oropharyngeal exudate.  Eyes: Conjunctivae are normal.  Neck: Trachea normal. No tracheal tenderness present. No tracheal deviation present. No thyromegaly present.  Cardiovascular: Normal rate, regular rhythm, S1 normal, S2 normal and normal heart sounds.   No murmur heard. Pulmonary/Chest: Breath sounds normal. No stridor. No respiratory distress. She has no wheezes. She has no rales.  Musculoskeletal: She exhibits no edema.  Lymphadenopathy:       Head (right side): No tonsillar adenopathy present.       Head (left side): No tonsillar adenopathy present.    She has no cervical adenopathy.  Neurological: She is alert. Gait normal.  Skin: Rash (Facial erythema) noted. She is not diaphoretic. No erythema. Nails show no clubbing.  Psychiatric: Mood and affect normal.    Diagnostics:    Spirometry was performed and demonstrated  an FEV1 of 1.82 at 55 % of predicted. She had a less than optimal effort on the spirometric maneuver.  The patient had an Asthma Control Test with the following results: ACT Total Score: 14.    Assessment and Plan:   1. Asthma, not well controlled, mild persistent, with acute exacerbation   2. Allergic rhinoconjunctivitis   3. LPRD (laryngopharyngeal reflux disease)   4. Esophageal dysmotility   5. Rosacea     1. Continue azithromycin 250 mg 3 days a week  2. Consistently use Qvar 80 two inhalation 2 time per day and increase to 3 inhalations 3 times per day as part of action plan for asthma flare  3. Continue Nasonex or OTC Rhinocort one spray each nostril one time per day  4. Continue AcipHex 20 mg in the  morning and ranitidine 300 mg in the evening  5. Continue ProAir HFA and antihistamine if needed  6. Appointment with Dr. Lyndel Safe as soon as possible  7. Return to clinic in 6 months or earlier if problem  I doubt that Aymee' complaints are related to asthma given the fact that she did not respond to systemic steroids, does not respond to a short acting bronchodilator, does not respond to the administration of Dulera, and has other active issues going on within her chest cavity including what appears to be some significant esophageal dysmotility. I think it would be best for her to be evaluated by a gastroenterologist once again to work through this issue. We will get her set up to see Dr. Lyndel Safe once again. I've asked her to stop her Dulera and use her Qvar consistently as there does not appear to be an indication to continue on this combination drug. I will see her back in this clinic in approximately 6 months or earlier if there is a problem.  Allena Katz, MD [place]

## 2015-12-28 ENCOUNTER — Other Ambulatory Visit (HOSPITAL_COMMUNITY): Payer: Self-pay | Admitting: Psychiatry

## 2016-01-08 ENCOUNTER — Encounter (HOSPITAL_COMMUNITY): Payer: Self-pay | Admitting: Psychiatry

## 2016-01-08 ENCOUNTER — Ambulatory Visit (INDEPENDENT_AMBULATORY_CARE_PROVIDER_SITE_OTHER): Payer: BLUE CROSS/BLUE SHIELD | Admitting: Psychiatry

## 2016-01-08 VITALS — BP 136/92 | HR 75 | Ht 69.5 in | Wt 256.6 lb

## 2016-01-08 DIAGNOSIS — F952 Tourette's disorder: Secondary | ICD-10-CM

## 2016-01-08 DIAGNOSIS — F332 Major depressive disorder, recurrent severe without psychotic features: Secondary | ICD-10-CM | POA: Diagnosis not present

## 2016-01-08 DIAGNOSIS — F411 Generalized anxiety disorder: Secondary | ICD-10-CM | POA: Diagnosis not present

## 2016-01-08 MED ORDER — BUPROPION HCL ER (XL) 300 MG PO TB24: ORAL_TABLET | ORAL | Status: DC

## 2016-01-08 MED ORDER — ARIPIPRAZOLE 20 MG PO TABS: 20.0000 mg | ORAL_TABLET | Freq: Every day | ORAL | Status: DC

## 2016-01-08 MED ORDER — HYDROXYZINE HCL 10 MG PO TABS: 10.0000 mg | ORAL_TABLET | Freq: Three times a day (TID) | ORAL | Status: DC | PRN

## 2016-01-08 NOTE — Progress Notes (Signed)
Patient ID: Penny Porter, female   DOB: 03-23-1966, 50 y.o.   MRN: [id number]   [place] [id number] Progress Note  Penny Porter [id number] 50 y.o.  01/08/2016 11:08 AM  Chief Complaint: "ok I guess"  History of Present Illness: Pt reports multiple stressors including poor health and finances. Pt reports she is having a lot of SOB  Depression comes and goes. States she is having on/off low motivation and anhedonia but has been trying out a few new activities at the recommendation of her therapist (listening to radio, reading, coloring). Reports on/off sad mood about 2 days a week. Pt has crying spells but they are even less frequent than before but she is feeling more sensative. She wonders if she is starting menopause.  Pt denies isolation and is usually with family. Pt feels worthless due to pain and inability to do the things she wants.   Sleep is so/so due to pain. Appetite is variable. Energy remains generally low. Pt is trying to walk when possible. She hurt her leg and knee.   Anxiety is ok. She has on/off racing thoughts. Her frustration tolerance is really low. No panic attacks since last visit. Driving and being in crowds cause anxiety and panic attacks.   Tics come and go depending on her anxiety and stress level. Vocal grunts, stomach jerking, eye squinting and arm jerking are all possible reactions. States stomach jerking never really goes away but seems to be going on all day. She also had arm jerks randomly and she makes a conscious effort to stop it. States Vistaril TID is helping with anxiety and tics but has not taken it in several days due to sedation.  Pt is taking Wellbutrin and Abilify as prescribed and denies SE. States meds are helping.   Therapy is going well and she is trying hard to utilize the coping skills she has learned.   Suicidal Ideation: No Plan Formed: No Patient has means to carry out plan: No  Homicidal Ideation: No Plan Formed: No Patient has  means to carry out plan: No  Review of Systems: Psychiatric: Agitation: No Hallucination: No Depressed Mood: Yes Insomnia: Yes Hypersomnia: No Altered Concentration: No Feels Worthless: Yes Grandiose Ideas: No Belief In Special Powers: No New/Increased Substance Abuse: No Compulsions: No    Review of Systems  Constitutional: Negative for fever, chills and weight loss.  HENT: Negative for congestion, ear pain, nosebleeds and sore throat.   Eyes: Positive for blurred vision and pain. Negative for double vision.  Respiratory: Positive for shortness of breath. Negative for cough and wheezing.   Cardiovascular: Negative for chest pain, palpitations and leg swelling.  Gastrointestinal: Negative for heartburn, nausea, vomiting and abdominal pain.  Musculoskeletal: Positive for back pain and joint pain. Negative for neck pain.  Skin: Negative for itching and rash.  Neurological: Positive for headaches. Negative for dizziness, sensory change, seizures and loss of consciousness.  Psychiatric/Behavioral: Positive for depression. Negative for suicidal ideas, hallucinations and substance abuse. The patient is nervous/anxious and has insomnia.      Past Medical Family, Social History: Pt lives in [place] with her mom and 2 sisters. Pt is single and does not have children.  Pt is unemployed. Pt denies hx of or current use of cigs, alcohol and illicit drugs.  Family History  Problem Relation Age of Onset  . Mental retardation Sister   . Dementia Father   . Suicidality Neg Hx    Past Medical History  Diagnosis Date  .  Asthma   . Depression   . GERD (gastroesophageal reflux disease)   . Hypertension   . Tourette disease   . Gastroparesis   . Trigeminal nerve disease   . Costochondral chest pain   . Arthritis     Back and hip  . Stenosis of lumbosacral spine   . Degenerative disk disease   . Bursitis   . Thyroid nodule   . Sleep apnea     not using CPAP   Outpatient Encounter  Prescriptions as of 01/08/2016  Medication Sig  . albuterol (PROAIR HFA) 108 (90 Base) MCG/ACT inhaler Inhale two puffs every four to six hours as needed for cough or wheeze.  Marland Kitchen albuterol (PROVENTIL HFA;VENTOLIN HFA) 108 (90 BASE) MCG/ACT inhaler Inhale into the lungs every 6 (six) hours as needed for wheezing or shortness of breath.  Marland Kitchen albuterol (PROVENTIL) (2.5 MG/3ML) 0.083% nebulizer solution PLACE 1 VIAL IN NEBULIZER EVERY 6 HOURS AS NEEDED FOR WHEEZING UP TO 30 DAYS  . Antiseborrheic Products, Misc. (PROMISEB EX) Apply topically.  . ARIPiprazole (ABILIFY) 20 MG tablet Take 1 tablet (20 mg total) by mouth daily.  Marland Kitchen aspirin 81 MG tablet Take 81 mg by mouth daily.  Marland Kitchen azelastine (ASTELIN) 0.1 % nasal spray Place 2 sprays into both nostrils 2 (two) times daily. Use in each nostril as directed  . azithromycin (ZITHROMAX) 250 MG tablet Take one tablet by mouth three times a week.  . beclomethasone (QVAR) 80 MCG/ACT inhaler Inhale 1 puff once daily to prevent cough or wheeze.  Rinse, gargle, and spit after use. Increase to 3 puffs 3 times daily during flare-up.  Marland Kitchen BENICAR 40 MG tablet   . buPROPion (WELLBUTRIN XL) 300 MG 24 hr tablet TAKE 1 TABLET (300 MG TOTAL) BY MOUTH EVERY MORNING.  . cetirizine (ZYRTEC) 10 MG tablet Take 10 mg by mouth daily.  . Cholecalciferol (VITAMIN D-3) 5000 UNITS TABS Take 1 tablet by mouth daily.  . cyclobenzaprine (FLEXERIL) 10 MG tablet Take 10 mg by mouth 3 (three) times daily.  . diclofenac sodium (VOLTAREN) 1 % GEL Apply 1 g topically 2 (two) times daily.  Marland Kitchen dicyclomine (BENTYL) 10 MG capsule Take 10 mg by mouth 3 (three) times daily before meals.  . felodipine (PLENDIL) 5 MG 24 hr tablet Take 2.5 mg by mouth daily. Reported on 10/09/2015  . furosemide (LASIX) 20 MG tablet 20 mg. Take 1-2 tabs po up to twice a day as needed for edema  . HYDROmorphone (DILAUDID) 2 MG tablet   . hydrOXYzine (ATARAX/VISTARIL) 10 MG tablet Take 1 tablet (10 mg total) by mouth 3 (three)  times daily as needed for anxiety.  Marland Kitchen ketoconazole (NIZORAL) 2 % cream Apply 1 application topically daily.  Marland Kitchen ketoconazole (NIZORAL) 2 % shampoo Apply 1 application topically 2 (two) times a week.  . lubiprostone (AMITIZA) 24 MCG capsule Take 24 mcg by mouth 2 (two) times daily with a meal.  . meloxicam (MOBIC) 7.5 MG tablet Take 7.5 mg by mouth 2 (two) times daily.  . metoprolol (LOPRESSOR) 50 MG tablet Take 50 mg by mouth daily.  . metoprolol succinate (TOPROL-XL) 50 MG 24 hr tablet   . metroNIDAZOLE (METROCREAM) 0.75 % cream Apply topically 2 (two) times daily.  . mometasone (NASONEX) 50 MCG/ACT nasal spray Place 2 sprays into the nose daily.  . mometasone (NASONEX) 50 MCG/ACT nasal spray Use one spray in each nostril once daily as directed.  . mometasone-formoterol (DULERA) 100-5 MCG/ACT AERO Inhale 2 puffs into the  lungs 2 (two) times daily.  Marland Kitchen olmesartan (BENICAR) 20 MG tablet Take 20 mg by mouth daily.  . ondansetron (ZOFRAN-ODT) 4 MG disintegrating tablet Take 4 mg by mouth 2 (two) times daily as needed for nausea or vomiting.  . predniSONE (DELTASONE) 10 MG tablet   . pregabalin (LYRICA) 75 MG capsule Take 75 mg by mouth 2 (two) times daily. Reported on 10/09/2015  . RABEprazole (ACIPHEX) 20 MG tablet Take 20 mg by mouth daily.  . ranitidine (ZANTAC) 300 MG tablet Take 300 mg by mouth at bedtime.  Marland Kitchen spironolactone (ALDACTONE) 25 MG tablet Take 25 mg by mouth daily.  . traMADol (ULTRAM) 50 MG tablet Take by mouth. 1-2 tabs po q6hr as needed for pain  . triamcinolone cream (KENALOG) 0.1 % Apply 1 application topically 2 (two) times daily.  . vitamin B-12 (CYANOCOBALAMIN) 1000 MCG tablet Take 1,000 mcg by mouth daily.   No facility-administered encounter medications on file as of 01/08/2016.    Past Psychiatric History/Hospitalization(s): Anxiety: No Bipolar Disorder: No Depression: Yes Mania: No Psychosis: No Schizophrenia: No Personality Disorder: No Hospitalization for  psychiatric illness: No History of Electroconvulsive Shock Therapy: No Prior Suicide Attempts: No  Physical Exam: Constitutional:  BP 136/92 mmHg  Pulse 75  Ht 5' 9.5" (1.765 m)  Wt 256 lb 9.6 oz (116.393 kg)  BMI 37.36 kg/m2  Pt advised to f/up with her PCP regarding BP  General Appearance: alert, oriented, no acute distress and obese  Musculoskeletal: Strength & Muscle Tone: within normal limits Gait & Station: moving slowly and walking with cane due to back problems Patient leans: straight  Mental Status Examination/Evaluation: Objective: Attitude: Calm and cooperative  Appearance: Casual, appears to be stated age  Eye Contact::  Good  Speech:  Clear and Coherent and Normal Rate  Volume:  Normal  Mood:  Depressed and anxious  Affect:  Congruent  Thought Process:  Goal Directed  Orientation:  Full (Time, Place, and Person)  Thought Content:  Negative  Suicidal Thoughts:  No  Homicidal Thoughts:  No  Judgement:  Fair  Insight:  Fair  Concentration: good  Memory: Immediate-fair Recent-fair Remote-fair  Recall: fair  Language: fair  Gait and Station: normal  [employer] of Knowledge: average  Psychomotor Activity:  Normal walking slow with a cane. No visible tics.   Akathisia:  No  Handed:  Right  AIMS (if indicated):  Facial and Oral Movements  Muscles of Facial Expression: None, normal  Lips and Perioral Area: None, normal  Jaw: None, normal  Tongue: None, normal Extremity Movements: Upper (arms, wrists, hands, fingers): None, normal  Lower (legs, knees, ankles, toes): None, normal,  Trunk Movements:  Neck, shoulders, hips: None, normal,  Overall Severity : Severity of abnormal movements (highest score from questions above): None, normal  Incapacitation due to abnormal movements: None, normal  Patient's awareness of abnormal movements (rate only patient's report): No Awareness, Dental Status  Current problems with teeth and/or dentures?: No  Does patient  usually wear dentures?: No    Assets:  Communication Skills Desire for Improvement Housing Administrator, Civil [employer] (Choose Three): Established Problem, Stable/Improving (1), Review of Psycho-Social Stressors (1), Established Problem, Worsening (2), Review of Medication Regimen & Side Effects (2) and Review of New Medication or Change in Dosage (2)   Assessment: AXIS I : Major Depression, Recurrent severe without psychotic features and Tourettes; Anxiety AXIS II : Deferred      Treatment Plan/Recommendations:  Plan  of Care:  Medication management with supportive therapy. Risks/benefits and SE of the medication discussed. Pt verbalized understanding and verbal consent obtained for treatment.  Affirm with the patient that the medications are taken as ordered. Patient expressed understanding of how their medications were to be used.     Laboratory: reviewed EKG 01/27/2014 QTc 406, NSR;   Labs WNL and scanned into chart   Psychotherapy: Therapy: brief supportive therapy provided. Discussed psychosocial stressors in detail.     Discussed [place] as a possibility in the future. She is concerned about finding transportation.   Medications: Abilify 20mg  daily for mood augmentation and tics  -Wellbutrin XR to 300mg  for depression. Higher doses associated with hypertension -Vistaril 10mg  po TID prn anxiety -declined changes to meds today Due to untreated sleep apnea pt advised not to use sedating meds at bedtime such as Clonazepam and pain meds.   Routine PRN Medications: No  Consultations: encouraged to continue appts with therapist  Pt has f/up with Gyno in July 2017  Safety Concerns: Pt denies SI and is at an acute low risk for suicide.Patient told to call clinic if any problems occur. Patient advised to go to ER if they should develop SI/HI, side effects, or if symptoms worsen. Has crisis numbers to call if needed. Pt verbalized understanding.    Other: F/up in 3 months or sooner if needed     Charlcie Cradle, MD 01/08/2016

## 2016-02-11 DIAGNOSIS — F419 Anxiety disorder, unspecified: Secondary | ICD-10-CM | POA: Insufficient documentation

## 2016-02-11 DIAGNOSIS — F411 Generalized anxiety disorder: Secondary | ICD-10-CM

## 2016-02-11 HISTORY — DX: Generalized anxiety disorder: F41.1

## 2016-04-03 DIAGNOSIS — M7062 Trochanteric bursitis, left hip: Secondary | ICD-10-CM | POA: Insufficient documentation

## 2016-04-10 ENCOUNTER — Encounter (HOSPITAL_COMMUNITY): Payer: Self-pay | Admitting: Psychiatry

## 2016-04-10 ENCOUNTER — Ambulatory Visit (INDEPENDENT_AMBULATORY_CARE_PROVIDER_SITE_OTHER): Payer: BLUE CROSS/BLUE SHIELD | Admitting: Psychiatry

## 2016-04-10 DIAGNOSIS — F332 Major depressive disorder, recurrent severe without psychotic features: Secondary | ICD-10-CM

## 2016-04-10 DIAGNOSIS — F411 Generalized anxiety disorder: Secondary | ICD-10-CM

## 2016-04-10 DIAGNOSIS — F952 Tourette's disorder: Secondary | ICD-10-CM | POA: Diagnosis not present

## 2016-04-10 MED ORDER — BUPROPION HCL ER (XL) 300 MG PO TB24: ORAL_TABLET | ORAL | 0 refills | Status: DC

## 2016-04-10 MED ORDER — HYDROXYZINE HCL 10 MG PO TABS: 10.0000 mg | ORAL_TABLET | Freq: Three times a day (TID) | ORAL | 0 refills | Status: DC | PRN

## 2016-04-10 MED ORDER — ARIPIPRAZOLE 20 MG PO TABS: 20.0000 mg | ORAL_TABLET | Freq: Every day | ORAL | 0 refills | Status: DC

## 2016-04-10 NOTE — Progress Notes (Signed)
Patient ID: Penny Porter, female   DOB: 1966/04/03, 50 y.o.   MRN: [id number]   [place] [address] Progress Note  Penny Porter [id number] 50 y.o.  04/10/2016 2:30 PM  Chief Complaint: "I am in pain"  History of Present Illness: Pt reports multiple stressors including poor health and finances. Pt reports she is having a lot of pain. Pt has a herniated lumbar disk and will be having back surgery next week. Her sister will help pt after surgery.   October 13th is her disability hearing.   Depression comes and goes. She is in a lot of pain and feels more down since being diagnosed with a herniated disk.  States she is having on/off low motivation and anhedonia but has been trying out a few new activities at the recommendation of her therapist (listening to radio, reading, coloring). Reports on/off sad mood several days a week. Pt has rare crying spells that are mostly related to hormonal changes. She wonders if she is starting menopause.  Pt denies isolation and is usually with family. Pt feels worthless due to pain and inability to do the things she wants.   Sleep is so/so due to pain. Appetite is variable. Energy remains generally low. Pt is trying to walk when possible but has a lot of joint pain.   Anxiety is situational now.  She has on/off racing thoughts. Her frustration tolerance is really low. No panic attacks since last visit. Driving and being in crowds cause anxiety and panic attacks.   Tics come and go depending on her anxiety and stress level. Vocal grunts, stomach jerking, eye squinting and arm jerking are all possible reactions. States stomach jerking never really goes away but seems to be going on all day. She also had arm jerks randomly and she makes a conscious effort to stop it. States Vistaril TID is helping with anxiety and tics but has not taken it in several days due to sedation.  Pt is taking Wellbutrin and Abilify as prescribed and denies SE. States meds are  helping.   Therapy is going well and she is trying hard to utilize the coping skills she has learned.   Suicidal Ideation: No Plan Formed: No Patient has means to carry out plan: No  Homicidal Ideation: No Plan Formed: No Patient has means to carry out plan: No  Review of Systems: Psychiatric: Agitation: No Hallucination: No Depressed Mood: Yes Insomnia: Yes Hypersomnia: No Altered Concentration: No Feels Worthless: Yes Grandiose Ideas: No Belief In Special Powers: No New/Increased Substance Abuse: No Compulsions: No    Review of Systems  Constitutional: Negative for chills, fever and weight loss.  HENT: Negative for congestion, ear pain, nosebleeds and sore throat.   Eyes: Positive for blurred vision and pain. Negative for double vision.  Respiratory: Negative for cough, shortness of breath and wheezing.   Cardiovascular: Negative for chest pain, palpitations and leg swelling.  Gastrointestinal: Negative for abdominal pain, heartburn, nausea and vomiting.  Musculoskeletal: Positive for back pain and joint pain. Negative for neck pain.  Skin: Negative for itching and rash.  Neurological: Positive for headaches. Negative for dizziness, sensory change, seizures and loss of consciousness.  Psychiatric/Behavioral: Positive for depression. Negative for hallucinations, substance abuse and suicidal ideas. The patient is nervous/anxious and has insomnia.      Past Medical Family, Social History: Pt lives in [place] with her mom and 2 sisters. Pt is single and does not have children.  Pt is unemployed. Pt denies hx of or  current use of cigs, alcohol and illicit drugs.  Family History  Problem Relation Age of Onset  . Mental retardation Sister   . Dementia Father   . Suicidality Neg Hx    Past Medical History:  Diagnosis Date  . Arthritis    Back and hip  . Asthma   . Bursitis   . Costochondral chest pain   . Degenerative disk disease   . Depression   . Gastroparesis   .  GERD (gastroesophageal reflux disease)   . Hypertension   . Sleep apnea    not using CPAP  . Stenosis of lumbosacral spine   . Thyroid nodule   . Tourette disease   . Trigeminal nerve disease    Outpatient Encounter Prescriptions as of 04/10/2016  Medication Sig  . albuterol (PROAIR HFA) 108 (90 Base) MCG/ACT inhaler Inhale two puffs every four to six hours as needed for cough or wheeze.  Marland Kitchen albuterol (PROVENTIL HFA;VENTOLIN HFA) 108 (90 BASE) MCG/ACT inhaler Inhale into the lungs every 6 (six) hours as needed for wheezing or shortness of breath.  Marland Kitchen albuterol (PROVENTIL) (2.5 MG/3ML) 0.083% nebulizer solution PLACE 1 VIAL IN NEBULIZER EVERY 6 HOURS AS NEEDED FOR WHEEZING UP TO 30 DAYS  . Antiseborrheic Products, Misc. (PROMISEB EX) Apply topically.  . ARIPiprazole (ABILIFY) 20 MG tablet Take 1 tablet (20 mg total) by mouth daily.  Marland Kitchen aspirin 81 MG tablet Take 81 mg by mouth daily.  Marland Kitchen azelastine (ASTELIN) 0.1 % nasal spray Place 2 sprays into both nostrils 2 (two) times daily. Use in each nostril as directed  . azithromycin (ZITHROMAX) 250 MG tablet Take one tablet by mouth three times a week.  . beclomethasone (QVAR) 80 MCG/ACT inhaler Inhale 1 puff once daily to prevent cough or wheeze.  Rinse, gargle, and spit after use. Increase to 3 puffs 3 times daily during flare-up.  Marland Kitchen BENICAR 40 MG tablet   . buPROPion (WELLBUTRIN XL) 300 MG 24 hr tablet TAKE 1 TABLET (300 MG TOTAL) BY MOUTH EVERY MORNING.  . cetirizine (ZYRTEC) 10 MG tablet Take 10 mg by mouth daily.  . Cholecalciferol (VITAMIN D-3) 5000 UNITS TABS Take 1 tablet by mouth daily.  . cyclobenzaprine (FLEXERIL) 10 MG tablet Take 10 mg by mouth 3 (three) times daily.  . diclofenac sodium (VOLTAREN) 1 % GEL Apply 1 g topically 2 (two) times daily.  Marland Kitchen dicyclomine (BENTYL) 10 MG capsule Take 10 mg by mouth 3 (three) times daily before meals.  . felodipine (PLENDIL) 5 MG 24 hr tablet Take 2.5 mg by mouth daily. Reported on 10/09/2015  .  furosemide (LASIX) 20 MG tablet 20 mg. Take 1-2 tabs po up to twice a day as needed for edema  . hydrOXYzine (ATARAX/VISTARIL) 10 MG tablet Take 1 tablet (10 mg total) by mouth 3 (three) times daily as needed for anxiety.  Marland Kitchen ketoconazole (NIZORAL) 2 % cream Apply 1 application topically daily.  Marland Kitchen ketoconazole (NIZORAL) 2 % shampoo Apply 1 application topically 2 (two) times a week.  . lubiprostone (AMITIZA) 24 MCG capsule Take 24 mcg by mouth 2 (two) times daily with a meal.  . meloxicam (MOBIC) 7.5 MG tablet Take 7.5 mg by mouth 2 (two) times daily.  . metoprolol (LOPRESSOR) 50 MG tablet Take 50 mg by mouth 2 (two) times daily.   . metroNIDAZOLE (METROCREAM) 0.75 % cream Apply topically 2 (two) times daily.  . mometasone (NASONEX) 50 MCG/ACT nasal spray Place 2 sprays into the nose daily.  . mometasone (  NASONEX) 50 MCG/ACT nasal spray Use one spray in each nostril once daily as directed.  . olmesartan (BENICAR) 20 MG tablet Take 20 mg by mouth daily.  . ondansetron (ZOFRAN-ODT) 4 MG disintegrating tablet Take 4 mg by mouth 2 (two) times daily as needed for nausea or vomiting.  . pregabalin (LYRICA) 75 MG capsule Take 75 mg by mouth 3 (three) times daily. Reported on 10/09/2015  . RABEprazole (ACIPHEX) 20 MG tablet Take 20 mg by mouth daily.  . ranitidine (ZANTAC) 300 MG tablet Take 300 mg by mouth at bedtime.  Marland Kitchen spironolactone (ALDACTONE) 25 MG tablet Take 25 mg by mouth daily.  . traMADol (ULTRAM) 50 MG tablet Take by mouth. 1-2 tabs po q6hr as needed for pain  . triamcinolone cream (KENALOG) 0.1 % Apply 1 application topically 2 (two) times daily.  . vitamin B-12 (CYANOCOBALAMIN) 1000 MCG tablet Take 1,000 mcg by mouth daily.  Marland Kitchen HYDROmorphone (DILAUDID) 2 MG tablet   . metoprolol succinate (TOPROL-XL) 50 MG 24 hr tablet   . mometasone-formoterol (DULERA) 100-5 MCG/ACT AERO Inhale 2 puffs into the lungs 2 (two) times daily.  . predniSONE (DELTASONE) 10 MG tablet    No facility-administered  encounter medications on file as of 04/10/2016.     Past Psychiatric History/Hospitalization(s): Anxiety: No Bipolar Disorder: No Depression: Yes Mania: No Psychosis: No Schizophrenia: No Personality Disorder: No Hospitalization for psychiatric illness: No History of Electroconvulsive Shock Therapy: No Prior Suicide Attempts: No  Physical Exam: Constitutional:  BP 120/80   Pulse 97   Ht 5' 9.5" (1.765 m)   Wt 258 lb 3.2 oz (117.1 kg)   BMI 37.58 kg/m   Pt advised to f/up with her PCP regarding BP  General Appearance: alert, oriented, no acute distress and obese  Musculoskeletal: Strength & Muscle Tone: within normal limits Gait & Station: moving slowly and walking with cane due to back problems Patient leans: straight  Mental Status Examination/Evaluation: Objective: Attitude: Calm and cooperative  Appearance: Casual, appears to be stated age  Eye Contact::  Good  Speech:  Clear and Coherent and Normal Rate  Volume:  Normal  Mood:  Depressed and anxious  Affect:  Congruent  Thought Process:  Goal Directed  Orientation:  Full (Time, Place, and Person)  Thought Content:  Negative  Suicidal Thoughts:  No  Homicidal Thoughts:  No  Judgement:  Fair  Insight:  Fair  Concentration: good  Memory: Immediate-fair Recent-fair Remote-fair  Recall: fair  Language: fair  Gait and Station: normal  [employer] of Knowledge: average  Psychomotor Activity:  Normal walking slow with a cane. No visible tics.   Akathisia:  No  Handed:  Right  AIMS (if indicated):  Facial and Oral Movements  Muscles of Facial Expression: None, normal  Lips and Perioral Area: None, normal  Jaw: None, normal  Tongue: None, normal Extremity Movements: Upper (arms, wrists, hands, fingers): None, normal  Lower (legs, knees, ankles, toes): None, normal,  Trunk Movements:  Neck, shoulders, hips: None, normal,  Overall Severity : Severity of abnormal movements (highest score from questions  above): None, normal  Incapacitation due to abnormal movements: None, normal  Patient's awareness of abnormal movements (rate only patient's report): No Awareness, Dental Status  Current problems with teeth and/or dentures?: No  Does patient usually wear dentures?: No    Assets:  Communication Skills Desire for Improvement Housing Talents/Skills Transportation       Assessment: AXIS I : Major Depression, Recurrent severe without psychotic features and Tourettes;  Anxiety AXIS II : Deferred      Treatment Plan/Recommendations:  Plan of Care:  Medication management with supportive therapy. Risks/benefits and SE of the medication discussed. Pt verbalized understanding and verbal consent obtained for treatment.  Affirm with the patient that the medications are taken as ordered. Patient expressed understanding of how their medications were to be used.     Laboratory: reviewed EKG 01/27/2014 QTc 406, NSR;   Labs WNL and scanned into chart   Psychotherapy: Therapy: brief supportive therapy provided. Discussed psychosocial stressors in detail.     Discussed [place] as a possibility in the future. She is concerned about finding transportation.   Medications: Abilify 20mg  daily for mood augmentation and tics  -Wellbutrin XR to 300mg  for depression. Higher doses associated with hypertension -Vistaril 10mg  po TID prn anxiety -declined changes to meds today Due to untreated sleep apnea pt advised not to use sedating meds at bedtime such as Clonazepam and pain meds.   Routine PRN Medications: No  Consultations: encouraged to continue appts with therapist    Safety Concerns: Pt denies SI and is at an acute low risk for suicide.Patient told to call clinic if any problems occur. Patient advised to go to ER if they should develop SI/HI, side effects, or if symptoms worsen. Has crisis numbers to call if needed. Pt verbalized understanding.   Other: F/up in 3 months or sooner if needed      Charlcie Cradle, MD 04/10/2016

## 2016-04-24 ENCOUNTER — Ambulatory Visit: Payer: BLUE CROSS/BLUE SHIELD | Admitting: Allergy and Immunology

## 2016-05-22 ENCOUNTER — Other Ambulatory Visit: Payer: Self-pay | Admitting: Allergy and Immunology

## 2016-06-09 ENCOUNTER — Other Ambulatory Visit: Payer: Self-pay | Admitting: *Deleted

## 2016-06-09 MED ORDER — BECLOMETHASONE DIPROPIONATE 80 MCG/ACT IN AERS: INHALATION_SPRAY | RESPIRATORY_TRACT | 0 refills | Status: DC

## 2016-06-11 HISTORY — PX: KNEE SURGERY: SHX244

## 2016-07-31 ENCOUNTER — Encounter (HOSPITAL_COMMUNITY): Payer: Self-pay | Admitting: Psychiatry

## 2016-07-31 ENCOUNTER — Ambulatory Visit (INDEPENDENT_AMBULATORY_CARE_PROVIDER_SITE_OTHER): Payer: BLUE CROSS/BLUE SHIELD | Admitting: Psychiatry

## 2016-07-31 DIAGNOSIS — F411 Generalized anxiety disorder: Secondary | ICD-10-CM | POA: Diagnosis not present

## 2016-07-31 DIAGNOSIS — Z818 Family history of other mental and behavioral disorders: Secondary | ICD-10-CM

## 2016-07-31 DIAGNOSIS — F952 Tourette's disorder: Secondary | ICD-10-CM | POA: Diagnosis not present

## 2016-07-31 DIAGNOSIS — Z79899 Other long term (current) drug therapy: Secondary | ICD-10-CM

## 2016-07-31 DIAGNOSIS — F332 Major depressive disorder, recurrent severe without psychotic features: Secondary | ICD-10-CM

## 2016-07-31 DIAGNOSIS — Z7982 Long term (current) use of aspirin: Secondary | ICD-10-CM

## 2016-07-31 MED ORDER — BUPROPION HCL ER (XL) 300 MG PO TB24: ORAL_TABLET | ORAL | 0 refills | Status: DC

## 2016-07-31 MED ORDER — ARIPIPRAZOLE 20 MG PO TABS: 20.0000 mg | ORAL_TABLET | Freq: Every day | ORAL | 0 refills | Status: DC

## 2016-07-31 MED ORDER — HYDROXYZINE HCL 10 MG PO TABS: 10.0000 mg | ORAL_TABLET | Freq: Three times a day (TID) | ORAL | 0 refills | Status: DC | PRN

## 2016-07-31 NOTE — Progress Notes (Signed)
Patient ID: Penny Porter, female   DOB: 18-Feb-1966, 50 y.o.   MRN: [id number]   Penny Porter  Penny Porter [id number] 50 y.o.  07/31/2016 1:59 PM  Chief Complaint: "so/so"  History of Present Illness: reviewed information below with patient on 07/31/16  and same as previous visits except as noted  Pt is not in the Christmas spirit. State she doesn't want to do anything. Reports it is related to a family situation.   Depression is a little better. She feels down at least 1-2 days a week. She doesn't like to go out by herself anymore. She is frustrated because her back hurts with any activity. Denies crying spells. Anhedonia continues. Denies worthlessness and hopelessness.   Sleep is so/so due to pain. Some nights are better than others.  Appetite is variable. Energy remains generally low.  Anxiety is situational.  She has on/off racing thoughts. Her frustration tolerance is really low. No panic attacks since last 2 visits.  Driving and being in crowds cause anxiety and panic attacks.   Tics come and go depending on her anxiety and stress level. Vocal grunts, stomach jerking, eye squinting and arm jerking are all possible reactions. States stomach jerking never really goes away but seems to be going on all day. She also had arm jerks randomly and she makes a conscious effort to stop it. States Vistaril TID is helping with anxiety and tics.  Pt is taking Wellbutrin and Abilify as prescribed and denies SE. States meds are helping.   Therapy is going well and she is trying hard to utilize the coping skills she has learned.   Suicidal Ideation: No Plan Formed: No Patient has means to carry out plan: No  Homicidal Ideation: No Plan Formed: No Patient has means to carry out plan: No  Review of Systems: Psychiatric: Agitation: No Hallucination: No Depressed Mood: Yes Insomnia: Yes Hypersomnia: No Altered Concentration: No Feels Worthless: No Grandiose Ideas:  No Belief In Special Powers: No New/Increased Substance Abuse: No Compulsions: No    Review of Systems  Eyes: Negative for blurred vision and pain.  Cardiovascular: Positive for chest pain, palpitations and leg swelling.  Musculoskeletal: Positive for back pain, joint pain and neck pain. Negative for falls.  Neurological: Positive for headaches. Negative for dizziness, tremors, seizures and loss of consciousness.  Psychiatric/Behavioral: Positive for depression. Negative for hallucinations, substance abuse and suicidal ideas. The patient is nervous/anxious and has insomnia.      Past Medical, Family, Social History: reviewed information with patient on 07/31/16  and same as previous visits except as noted Pt lives in [place] with her mom and 2 sisters. Pt is single and does not have children.  Pt is unemployed. Pt denies hx of or current use of cigs, alcohol and illicit drugs.  Family History  Problem Relation Age of Onset  . Mental retardation Sister   . Dementia Father   . Suicidality Neg Hx    Past Medical History:  Diagnosis Date  . Arthritis    Back and hip  . Asthma   . Bursitis   . Costochondral chest pain   . Degenerative disk disease   . Depression   . Gastroparesis   . GERD (gastroesophageal reflux disease)   . Hypertension   . Lumbar herniated disc   . Sleep apnea    not using CPAP  . Stenosis of lumbosacral spine   . Thyroid nodule   . Tourette disease   . Trigeminal nerve  disease    Outpatient Encounter Prescriptions as of 07/31/2016  Medication Sig  . albuterol (PROAIR HFA) 108 (90 Base) MCG/ACT inhaler Inhale two puffs every four to six hours as needed for cough or wheeze.  Marland Kitchen albuterol (PROVENTIL HFA;VENTOLIN HFA) 108 (90 BASE) MCG/ACT inhaler Inhale into the lungs every 6 (six) hours as needed for wheezing or shortness of breath.  Marland Kitchen albuterol (PROVENTIL) (2.5 MG/3ML) 0.083% nebulizer solution PLACE 1 VIAL IN NEBULIZER EVERY 6 HOURS AS NEEDED FOR  WHEEZING UP TO 30 DAYS  . Antiseborrheic Products, Misc. (PROMISEB EX) Apply topically.  . ARIPiprazole (ABILIFY) 20 MG tablet Take 1 tablet (20 mg total) by mouth daily.  Marland Kitchen aspirin 81 MG tablet Take 81 mg by mouth daily.  Marland Kitchen azelastine (ASTELIN) 0.1 % nasal spray Place 2 sprays into both nostrils 2 (two) times daily. Use in each nostril as directed  . azithromycin (ZITHROMAX) 250 MG tablet Take one tablet by mouth three times daily  . beclomethasone (QVAR) 80 MCG/ACT inhaler Inhale 1 puff once daily to prevent cough or wheeze.  Rinse, gargle, and spit after use. Increase to 3 puffs 3 times daily during flare-up.  Marland Kitchen BENICAR 40 MG tablet   . buPROPion (WELLBUTRIN XL) 300 MG 24 hr tablet TAKE 1 TABLET (300 MG TOTAL) BY MOUTH EVERY MORNING.  . cetirizine (ZYRTEC) 10 MG tablet Take 10 mg by mouth daily.  . Cholecalciferol (VITAMIN D-3) 5000 UNITS TABS Take 1 tablet by mouth daily.  . cyclobenzaprine (FLEXERIL) 10 MG tablet Take 10 mg by mouth 3 (three) times daily.  . diclofenac sodium (VOLTAREN) 1 % GEL Apply 1 g topically 2 (two) times daily.  Marland Kitchen dicyclomine (BENTYL) 10 MG capsule Take 10 mg by mouth 3 (three) times daily before meals.  . felodipine (PLENDIL) 5 MG 24 hr tablet Take 2.5 mg by mouth daily. Reported on 10/09/2015  . furosemide (LASIX) 20 MG tablet 20 mg. Take 1-2 tabs po up to twice a day as needed for edema  . HYDROmorphone (DILAUDID) 2 MG tablet   . hydrOXYzine (ATARAX/VISTARIL) 10 MG tablet Take 1 tablet (10 mg total) by mouth 3 (three) times daily as needed for anxiety.  Marland Kitchen ketoconazole (NIZORAL) 2 % cream Apply 1 application topically daily.  Marland Kitchen ketoconazole (NIZORAL) 2 % shampoo Apply 1 application topically 2 (two) times a week.  . lubiprostone (AMITIZA) 24 MCG capsule Take 24 mcg by mouth 2 (two) times daily with a meal.  . meloxicam (MOBIC) 7.5 MG tablet Take 7.5 mg by mouth 2 (two) times daily.  . metoprolol (LOPRESSOR) 50 MG tablet Take 50 mg by mouth 2 (two) times daily.   .  metoprolol succinate (TOPROL-XL) 50 MG 24 hr tablet   . metroNIDAZOLE (METROCREAM) 0.75 % cream Apply topically 2 (two) times daily.  . mometasone (NASONEX) 50 MCG/ACT nasal spray Place 2 sprays into the nose daily.  . mometasone (NASONEX) 50 MCG/ACT nasal spray Use one spray in each nostril once daily as directed.  . mometasone-formoterol (DULERA) 100-5 MCG/ACT AERO Inhale 2 puffs into the lungs 2 (two) times daily.  Marland Kitchen olmesartan (BENICAR) 20 MG tablet Take 20 mg by mouth daily.  . ondansetron (ZOFRAN-ODT) 4 MG disintegrating tablet Take 4 mg by mouth 2 (two) times daily as needed for nausea or vomiting.  . predniSONE (DELTASONE) 10 MG tablet   . pregabalin (LYRICA) 75 MG capsule Take 75 mg by mouth 3 (three) times daily. Reported on 10/09/2015  . RABEprazole (ACIPHEX) 20 MG tablet Take  20 mg by mouth daily.  . ranitidine (ZANTAC) 300 MG tablet Take 300 mg by mouth at bedtime.  Marland Kitchen spironolactone (ALDACTONE) 25 MG tablet Take 25 mg by mouth daily.  . traMADol (ULTRAM) 50 MG tablet Take by mouth. 1-2 tabs po q6hr as needed for pain  . triamcinolone cream (KENALOG) 0.1 % Apply 1 application topically 2 (two) times daily.  . vitamin B-12 (CYANOCOBALAMIN) 1000 MCG tablet Take 1,000 mcg by mouth daily.   No facility-administered encounter medications on file as of 07/31/2016.     Past Psychiatric History/Hospitalization(s): Anxiety: No Bipolar Disorder: No Depression: Yes Mania: No Psychosis: No Schizophrenia: No Personality Disorder: No Hospitalization for psychiatric illness: No History of Electroconvulsive Shock Therapy: No Prior Suicide Attempts: No  Physical Exam: Constitutional:  BP 128/80   Pulse 100   Ht 5' 9.5" (1.765 m)   Wt 269 lb (122 kg)   BMI 39.15 kg/m   Pt advised to f/up with her PCP regarding BP  General Appearance: alert, oriented, no acute distress and obese  Musculoskeletal: Strength & Muscle Tone: within normal limits Gait & Station: moving slowly and  walking with cane due to back problems Patient leans: straight  Mental Status Examination/Evaluation: reviewed MSE on 07/31/16  and same as previous visits except as noted  Objective: Attitude: Calm and cooperative  Appearance: Casual, appears to be stated age  Eye Contact::  Good  Speech:  Clear and Coherent and Normal Rate  Volume:  Normal  Mood:  Depressed and anxious  Affect:  Congruent  Thought Process:  Goal Directed  Orientation:  Full (Time, Place, and Person)  Thought Content:  Negative  Suicidal Thoughts:  No  Homicidal Thoughts:  No  Judgement:  Fair  Insight:  Fair  Concentration: good  Memory: Immediate-fair Recent-fair Remote-fair  Recall: fair  Language: fair  Gait and Station: normal  [employer] Knowledge: average  Psychomotor Activity:  Normal walking slow with a cane. No visible tics.   Akathisia:  No  Handed:  Right  AIMS (if indicated):  Facial and Oral Movements  Muscles of Facial Expression: None, normal  Lips and Perioral Area: None, normal  Jaw: None, normal  Tongue: None, normal Extremity Movements: Upper (arms, wrists, hands, fingers): None, normal  Lower (legs, knees, ankles, toes): None, normal,  Trunk Movements:  Neck, shoulders, hips: None, normal,  Overall Severity : Severity of abnormal movements (highest score from questions above): None, normal  Incapacitation due to abnormal movements: None, normal  Patient's awareness of abnormal movements (rate only patient's report): No Awareness, Dental Status  Current problems with teeth and/or dentures?: No  Does patient usually wear dentures?: No    Assets:  Communication Skills Desire for Improvement Housing Talents/Skills Transportation      reviewed A&P below on 07/31/16  and same as previous visits except as noted Assessment: AXIS I : Major Depression, Recurrent severe without psychotic features and Tourettes; Anxiety AXIS II : Deferred      Treatment  Plan/Recommendations:  Plan of Care:  Medication management with supportive therapy. Risks/benefits and SE of the medication discussed. Pt verbalized understanding and verbal consent obtained for treatment.  Affirm with the patient that the medications are taken as ordered. Patient expressed understanding of how their medications were to be used.     Laboratory: reviewed EKG 01/27/2014 QTc 406, NSR;   Labs WNL and scanned into chart   Psychotherapy: Therapy: brief supportive therapy provided. Discussed psychosocial stressors in detail.  Discussed [place] as a possibility in the future. She is concerned about finding transportation.   Medications: Abilify 20mg  daily for mood augmentation and tics  -Wellbutrin XR to 300mg  for depression. Higher doses associated with hypertension -Vistaril 10mg  po TID prn anxiety -declined changes to meds today Due to untreated sleep apnea pt advised not to use sedating meds at bedtime such as Clonazepam and pain meds.   Routine PRN Medications: No  Consultations: encouraged to continue appts with therapist    Safety Concerns: Pt denies SI and is at an acute low risk for suicide.Patient told to call clinic if any problems occur. Patient advised to go to ER if they should develop SI/HI, side effects, or if symptoms worsen. Has crisis numbers to call if needed. Pt verbalized understanding.   Other: F/up in 3 months or sooner if needed     Charlcie Cradle, MD 07/31/2016

## 2016-08-14 ENCOUNTER — Ambulatory Visit (HOSPITAL_COMMUNITY): Payer: Self-pay | Admitting: Psychiatry

## 2016-08-20 ENCOUNTER — Encounter: Payer: Self-pay | Admitting: Allergy and Immunology

## 2016-08-20 ENCOUNTER — Ambulatory Visit (INDEPENDENT_AMBULATORY_CARE_PROVIDER_SITE_OTHER): Payer: BLUE CROSS/BLUE SHIELD | Admitting: Allergy and Immunology

## 2016-08-20 VITALS — BP 124/90 | HR 80 | Resp 18

## 2016-08-20 DIAGNOSIS — J4520 Mild intermittent asthma, uncomplicated: Secondary | ICD-10-CM | POA: Diagnosis not present

## 2016-08-20 DIAGNOSIS — K219 Gastro-esophageal reflux disease without esophagitis: Secondary | ICD-10-CM

## 2016-08-20 DIAGNOSIS — J3089 Other allergic rhinitis: Secondary | ICD-10-CM

## 2016-08-20 NOTE — Patient Instructions (Addendum)
  1. "action plan" including Qvar 80 two inhalation - 3 inhalations 3 times per day for asthma flare  2. Use Nasonex or OTC Rhinocort one spray each nostril 3-7 times per week during upper airway symptoms  3. Continue AcipHex 20 mg in the morning and ranitidine 300 mg in the evening  4. Continue ProAir HFA and antihistamine if needed  5. Return to clinic in 6 months or earlier if problem

## 2016-08-20 NOTE — Progress Notes (Signed)
Follow-up Note  Referring Provider: Raina Mina., MD Primary Provider: Gilford Rile, MD Date of Office Visit: 08/20/2016  Subjective:   Penny Porter (DOB: 1965/09/04) is a 51 y.o. female who returns to the [facility] on 08/20/2016 in re-evaluation of the following:  HPI: Penny Porter returns to this clinic in reevaluation of her asthma and allergic rhinitis and reflux. I've not seen his clinic since April 2017.  Her asthma has really done quite well. It does sound as though she's been able to work through the issue of her anxiety and her reflux and her asthma interacting with each other. Presently she is not using an inhaled steroid as she tapered off her Qvar over the course the past 6 months. She rarely uses a short acting bronchodilator. Cold air appears to be the only trigger at this point in time that may give rise to some coughing and wheezing.   Her nose has not been causing her any problems at all at this point in time although she has been a little bit congested and may be having a little rhinorrhea over the course the past month or so. She's had no associated anosmia or or ugly nasal discharge. She's not using a nasal steroid at this point.  Her reflux is under good control this point in time. She's using AcipHex and ranitidine. She did apparently visit with Dr. Lyndel Safe and had an upper endoscopy sometime this past year in evaluation of her slow esophageal transit time.   Since her last visit in this clinic she's had another back surgery in September and has had a left knee surgery in November.  She did obtain a flu vaccine this year  Allergies as of 08/20/2016      Reactions   Gadoversetamide Hives   Avelox [moxifloxacin Hcl In Nacl] Other (See Comments)   Caused pain in shoulders to finger, tendonitis   Moxifloxacin Other (See Comments)   Caused pain in shoulders to finger, tendonitis   Amoxicillin Hives   Bextra [valdecoxib] Hives, Swelling   Brintellix  [vortioxetine] Nausea And Vomiting   Doxycycline Hyclate Hives, Swelling   Fetzima [levomilnacipran]    Tremors across head   Gadobutrol Hives   Gadolinium Derivatives Hives   Iodinated Diagnostic Agents    Lamictal [lamotrigine] Hives   Naproxen Hypertension   Nitrofurantoin Other (See Comments)   Percolone [oxycodone] Hives   Relafen [nabumetone] Hypertension   Risperdal [risperidone]    Leg weakness      Medication List      albuterol 108 (90 Base) MCG/ACT inhaler Commonly known as:  PROAIR HFA Inhale two puffs every four to six hours as needed for cough or wheeze.   albuterol (2.5 MG/3ML) 0.083% nebulizer solution Commonly known as:  PROVENTIL PLACE 1 VIAL IN NEBULIZER EVERY 6 HOURS AS NEEDED FOR WHEEZING UP TO 30 DAYS   ARIPiprazole 20 MG tablet Commonly known as:  ABILIFY Take 1 tablet (20 mg total) by mouth daily.   aspirin 81 MG tablet Take 81 mg by mouth daily.   azelastine 0.1 % nasal spray Commonly known as:  ASTELIN Place 2 sprays into both nostrils 2 (two) times daily. Use in each nostril as directed   azithromycin 250 MG tablet Commonly known as:  ZITHROMAX Take one tablet by mouth three times daily   beclomethasone 80 MCG/ACT inhaler Commonly known as:  QVAR Inhale 1 puff once daily to prevent cough or wheeze.  Rinse, gargle, and spit after use. Increase to  3 puffs 3 times daily during flare-up.   BENICAR 40 MG tablet Generic drug:  olmesartan   buPROPion 300 MG 24 hr tablet Commonly known as:  WELLBUTRIN XL TAKE 1 TABLET (300 MG TOTAL) BY MOUTH EVERY MORNING.   cetirizine 10 MG tablet Commonly known as:  ZYRTEC Take 10 mg by mouth daily.   cyclobenzaprine 10 MG tablet Commonly known as:  FLEXERIL Take 10 mg by mouth 3 (three) times daily.   dicyclomine 10 MG capsule Commonly known as:  BENTYL Take 10 mg by mouth 3 (three) times daily before meals.   felodipine 5 MG 24 hr tablet Commonly known as:  PLENDIL Take 2.5 mg by mouth daily.  Reported on 10/09/2015   furosemide 20 MG tablet Commonly known as:  LASIX 20 mg. Take 1-2 tabs po up to twice a day as needed for edema   hydrOXYzine 10 MG tablet Commonly known as:  ATARAX/VISTARIL Take 1 tablet (10 mg total) by mouth 3 (three) times daily as needed for anxiety.   ketoconazole 2 % shampoo Commonly known as:  NIZORAL Apply 1 application topically 2 (two) times a week.   ketoconazole 2 % cream Commonly known as:  NIZORAL Apply 1 application topically daily.   lubiprostone 24 MCG capsule Commonly known as:  AMITIZA Take 24 mcg by mouth 2 (two) times daily with a meal.   meloxicam 7.5 MG tablet Commonly known as:  MOBIC Take 7.5 mg by mouth 2 (two) times daily.   metoprolol succinate 50 MG 24 hr tablet Commonly known as:  TOPROL-XL   metroNIDAZOLE 0.75 % cream Commonly known as:  METROCREAM Apply topically 2 (two) times daily.   mometasone 50 MCG/ACT nasal spray Commonly known as:  NASONEX Use one spray in each nostril once daily as directed.   ondansetron 4 MG disintegrating tablet Commonly known as:  ZOFRAN-ODT Take 4 mg by mouth 2 (two) times daily as needed for nausea or vomiting.   pregabalin 75 MG capsule Commonly known as:  LYRICA Take 75 mg by mouth 3 (three) times daily. Reported on 10/09/2015   PROMISEB EX Apply topically.   RABEprazole 20 MG tablet Commonly known as:  ACIPHEX Take 20 mg by mouth daily.   ranitidine 300 MG tablet Commonly known as:  ZANTAC Take 300 mg by mouth at bedtime.   spironolactone 25 MG tablet Commonly known as:  ALDACTONE Take 25 mg by mouth daily.   traMADol 50 MG tablet Commonly known as:  ULTRAM Take by mouth. 1-2 tabs po q6hr as needed for pain   triamcinolone cream 0.1 % Commonly known as:  KENALOG Apply 1 application topically 2 (two) times daily.   vitamin B-12 1000 MCG tablet Commonly known as:  CYANOCOBALAMIN Take 1,000 mcg by mouth daily.   Vitamin D-3 5000 units Tabs Take 1 tablet by  mouth daily.   VOLTAREN 1 % Gel Generic drug:  diclofenac sodium Apply 1 g topically 2 (two) times daily.       Past Medical History:  Diagnosis Date  . Arthritis    Back and hip  . Asthma   . Bursitis   . Costochondral chest pain   . Degenerative disk disease   . Depression   . Gastroparesis   . GERD (gastroesophageal reflux disease)   . Hypertension   . Lumbar herniated disc   . Sleep apnea    not using CPAP  . Stenosis of lumbosacral spine   . Thyroid nodule   . Tourette disease   .  Trigeminal nerve disease     Past Surgical History:  Procedure Laterality Date  . BACK SURGERY  September 19, 2015  . bone spur removal on back    . CHOLECYSTECTOMY  2005  . ELBOW SURGERY Left May 29, 2014  . KNEE SURGERY  06/2016  . polyp removal    . ruptured disk  2008  . TEAR DUCT PROBING  2009    Review of systems negative except as noted in HPI / PMHx or noted below:  Review of Systems  Constitutional: Negative.   HENT: Negative.   Eyes: Negative.   Respiratory: Negative.   Cardiovascular: Negative.   Gastrointestinal: Negative.   Genitourinary: Negative.   Musculoskeletal: Negative.   Skin: Negative.   Neurological: Negative.   Endo/Heme/Allergies: Negative.   Psychiatric/Behavioral: Negative.      Objective:   Vitals:   08/20/16 1336  BP: 124/90  Pulse: 80  Resp: 18          Physical Exam  Constitutional: She is well-developed, well-nourished, and in no distress.  HENT:  Head: Normocephalic.  Right Ear: Tympanic membrane, external ear and ear canal normal.  Left Ear: Tympanic membrane, external ear and ear canal normal.  Nose: Nose normal. No mucosal edema or rhinorrhea.  Mouth/Throat: Uvula is midline, oropharynx is clear and moist and mucous membranes are normal. No oropharyngeal exudate.  Eyes: Conjunctivae are normal.  Neck: Trachea normal. No tracheal tenderness present. No tracheal deviation present. No thyromegaly present.  Cardiovascular:  Normal rate, regular rhythm, S1 normal, S2 normal and normal heart sounds.   No murmur heard. Pulmonary/Chest: Breath sounds normal. No stridor. No respiratory distress. She has no wheezes. She has no rales.  Musculoskeletal: She exhibits no edema.  Lymphadenopathy:       Head (right side): No tonsillar adenopathy present.       Head (left side): No tonsillar adenopathy present.    She has no cervical adenopathy.  Neurological: She is alert. Gait normal.  Skin: No rash noted. She is not diaphoretic. No erythema. Nails show no clubbing.  Psychiatric: Mood and affect normal.    Diagnostics:    Spirometry was performed and demonstrated an FEV1 of 2.18 at 66 % of predicted.  The patient had an Asthma Control Test with the following results: ACT Total Score: 22.    Assessment and Plan:   1. Asthma, mild intermittent, well-controlled   2. Other allergic rhinitis   3. LPRD (laryngopharyngeal reflux disease)     1. "action plan" including Qvar 80 two inhalation - 3 inhalations 3 times per day for asthma flare  2. Use Nasonex or OTC Rhinocort one spray each nostril 3-7 times per week during upper airway symptoms  3. Continue AcipHex 20 mg in the morning and ranitidine 300 mg in the evening  4. Continue ProAir HFA and antihistamine if needed  5. Return to clinic in 6 months or earlier if problem   Penny Porter appears to be doing very well at this point in time and she will use an action plan if she develops an asthma flare in the future and I've encouraged her to use some form of nasal steroids at least a few times a week to help with her rhinitis and she'll continue on therapy for reflux as noted above. I'll see her back in this clinic in 6 months or earlier if there is a problem.   Allena Katz, MD [place]

## 2016-09-01 ENCOUNTER — Other Ambulatory Visit: Payer: Self-pay | Admitting: Allergy and Immunology

## 2016-10-14 DIAGNOSIS — S92352A Displaced fracture of fifth metatarsal bone, left foot, initial encounter for closed fracture: Secondary | ICD-10-CM | POA: Diagnosis not present

## 2016-10-14 DIAGNOSIS — F419 Anxiety disorder, unspecified: Secondary | ICD-10-CM | POA: Diagnosis not present

## 2016-10-14 DIAGNOSIS — I10 Essential (primary) hypertension: Secondary | ICD-10-CM | POA: Diagnosis not present

## 2016-10-14 DIAGNOSIS — F332 Major depressive disorder, recurrent severe without psychotic features: Secondary | ICD-10-CM | POA: Diagnosis not present

## 2016-10-23 ENCOUNTER — Ambulatory Visit (HOSPITAL_COMMUNITY): Payer: Self-pay | Admitting: Psychiatry

## 2016-10-23 DIAGNOSIS — E042 Nontoxic multinodular goiter: Secondary | ICD-10-CM | POA: Diagnosis not present

## 2016-10-23 DIAGNOSIS — Z6838 Body mass index (BMI) 38.0-38.9, adult: Secondary | ICD-10-CM | POA: Diagnosis not present

## 2016-10-23 DIAGNOSIS — R002 Palpitations: Secondary | ICD-10-CM | POA: Diagnosis not present

## 2016-10-23 DIAGNOSIS — R42 Dizziness and giddiness: Secondary | ICD-10-CM | POA: Diagnosis not present

## 2016-10-23 DIAGNOSIS — I10 Essential (primary) hypertension: Secondary | ICD-10-CM | POA: Diagnosis not present

## 2016-10-27 DIAGNOSIS — R600 Localized edema: Secondary | ICD-10-CM | POA: Diagnosis not present

## 2016-10-27 DIAGNOSIS — I10 Essential (primary) hypertension: Secondary | ICD-10-CM | POA: Diagnosis not present

## 2016-10-27 DIAGNOSIS — I878 Other specified disorders of veins: Secondary | ICD-10-CM | POA: Diagnosis not present

## 2016-10-27 DIAGNOSIS — R5381 Other malaise: Secondary | ICD-10-CM | POA: Diagnosis not present

## 2016-10-27 DIAGNOSIS — M7989 Other specified soft tissue disorders: Secondary | ICD-10-CM | POA: Diagnosis not present

## 2016-10-27 DIAGNOSIS — M84375G Stress fracture, left foot, subsequent encounter for fracture with delayed healing: Secondary | ICD-10-CM | POA: Diagnosis not present

## 2016-10-27 DIAGNOSIS — R5383 Other fatigue: Secondary | ICD-10-CM | POA: Diagnosis not present

## 2016-10-27 DIAGNOSIS — R609 Edema, unspecified: Secondary | ICD-10-CM | POA: Diagnosis not present

## 2016-10-29 DIAGNOSIS — S92352A Displaced fracture of fifth metatarsal bone, left foot, initial encounter for closed fracture: Secondary | ICD-10-CM | POA: Diagnosis not present

## 2016-10-31 ENCOUNTER — Other Ambulatory Visit (HOSPITAL_COMMUNITY): Payer: Self-pay | Admitting: Psychiatry

## 2016-10-31 DIAGNOSIS — F952 Tourette's disorder: Secondary | ICD-10-CM

## 2016-10-31 DIAGNOSIS — F332 Major depressive disorder, recurrent severe without psychotic features: Secondary | ICD-10-CM

## 2016-11-04 ENCOUNTER — Other Ambulatory Visit (HOSPITAL_COMMUNITY): Payer: Self-pay

## 2016-11-04 DIAGNOSIS — F952 Tourette's disorder: Secondary | ICD-10-CM

## 2016-11-04 DIAGNOSIS — F332 Major depressive disorder, recurrent severe without psychotic features: Secondary | ICD-10-CM

## 2016-11-04 MED ORDER — ARIPIPRAZOLE 20 MG PO TABS: 20.0000 mg | ORAL_TABLET | Freq: Every day | ORAL | 0 refills | Status: DC

## 2016-11-17 DIAGNOSIS — I10 Essential (primary) hypertension: Secondary | ICD-10-CM | POA: Diagnosis not present

## 2016-11-17 DIAGNOSIS — F321 Major depressive disorder, single episode, moderate: Secondary | ICD-10-CM | POA: Diagnosis not present

## 2016-11-17 DIAGNOSIS — F411 Generalized anxiety disorder: Secondary | ICD-10-CM | POA: Diagnosis not present

## 2016-11-17 DIAGNOSIS — I878 Other specified disorders of veins: Secondary | ICD-10-CM | POA: Diagnosis not present

## 2016-11-17 DIAGNOSIS — Z780 Asymptomatic menopausal state: Secondary | ICD-10-CM | POA: Diagnosis not present

## 2016-11-20 ENCOUNTER — Ambulatory Visit (INDEPENDENT_AMBULATORY_CARE_PROVIDER_SITE_OTHER): Payer: PPO | Admitting: Psychiatry

## 2016-11-20 ENCOUNTER — Encounter (HOSPITAL_COMMUNITY): Payer: Self-pay | Admitting: Psychiatry

## 2016-11-20 DIAGNOSIS — F411 Generalized anxiety disorder: Secondary | ICD-10-CM

## 2016-11-20 DIAGNOSIS — F332 Major depressive disorder, recurrent severe without psychotic features: Secondary | ICD-10-CM | POA: Diagnosis not present

## 2016-11-20 DIAGNOSIS — F952 Tourette's disorder: Secondary | ICD-10-CM

## 2016-11-20 DIAGNOSIS — Z810 Family history of intellectual disabilities: Secondary | ICD-10-CM

## 2016-11-20 DIAGNOSIS — Z7982 Long term (current) use of aspirin: Secondary | ICD-10-CM

## 2016-11-20 DIAGNOSIS — Z79899 Other long term (current) drug therapy: Secondary | ICD-10-CM

## 2016-11-20 MED ORDER — ARIPIPRAZOLE 20 MG PO TABS: 20.0000 mg | ORAL_TABLET | Freq: Every day | ORAL | 3 refills | Status: DC

## 2016-11-20 MED ORDER — BUPROPION HCL ER (XL) 300 MG PO TB24: ORAL_TABLET | ORAL | 3 refills | Status: DC

## 2016-11-20 MED ORDER — HYDROXYZINE HCL 10 MG PO TABS: 10.0000 mg | ORAL_TABLET | Freq: Three times a day (TID) | ORAL | 3 refills | Status: DC | PRN

## 2016-11-20 NOTE — Progress Notes (Signed)
[facility] MD/PA/NP OP Progress Note  11/20/2016 10:27 AM Penny Porter  MRN:  [id number]  Chief Complaint:  Chief Complaint    Follow-up      HPI: Pt states she has been having a lot of issues. Her mom's cancer has spread and has 6 months to live. Hospice is coming to the home to help. She is not able to leave her mother alone.   States depression is manageable. She is coloring but doesn't read anymore. She is feeling isolated and can't do much of anything due to caring for her mom. Denies worthlessness and hopelessness. Denies SI/HI.  Sleep is good. Energy is limited. She broke her foot in January and is having trouble walking.  Anxiety is "not too bad". She is taking Vistaril BID and it helps to calm her.  Tics come and go with stress but Vistaril is helping.  Taking meds as prescribed and denies SE.      Visit Diagnosis:    ICD-9-CM ICD-10-CM   1. Anxiety state 300.00 F41.1 hydrOXYzine (ATARAX/VISTARIL) 10 MG tablet  2. Major depressive disorder, recurrent, severe without psychotic features ([place]) 296.33 F33.2 buPROPion (WELLBUTRIN XL) 300 MG 24 hr tablet     ARIPiprazole (ABILIFY) 20 MG tablet  3. Tourette's disease 307.23 F95.2 ARIPiprazole (ABILIFY) 20 MG tablet    Past Psychiatric History: see H&P  Past Medical History:  Past Medical History:  Diagnosis Date  . Arthritis    Back and hip  . Asthma   . Bursitis   . Costochondral chest pain   . Degenerative disk disease   . Depression   . Gastroparesis   . GERD (gastroesophageal reflux disease)   . Hypertension   . Lumbar herniated disc   . Sleep apnea    not using CPAP  . Stenosis of lumbosacral spine   . Thyroid nodule   . Tourette disease   . Trigeminal nerve disease     Past Surgical History:  Procedure Laterality Date  . BACK SURGERY  September 19, 2015  . bone spur removal on back    . CHOLECYSTECTOMY  2005  . ELBOW SURGERY Left May 29, 2014  . KNEE SURGERY  06/2016  . polyp removal    . ruptured disk  2008   . TEAR DUCT PROBING  2009    Family Psychiatric History:  Family History  Problem Relation Age of Onset  . Mental retardation Sister   . Dementia Father   . Suicidality Neg Hx     Social History:  Social History   Social History  . Marital status: Single    Spouse name: N/A  . Number of children: N/A  . Years of education: N/A   Social History Main Topics  . Smoking status: Never Smoker  . Smokeless tobacco: Never Used  . Alcohol use No  . Drug use: No  . Sexual activity: Not Currently   Other Topics Concern  . None   Social History Narrative  . None    Allergies:  Allergies  Allergen Reactions  . Gadoversetamide Hives  . Avelox [Moxifloxacin Hcl In Nacl] Other (See Comments)    Caused pain in shoulders to finger, tendonitis  . Moxifloxacin Other (See Comments)    Caused pain in shoulders to finger, tendonitis  . Amoxicillin Hives  . Bextra [Valdecoxib] Hives and Swelling  . Brintellix [Vortioxetine] Nausea And Vomiting  . Doxycycline Hyclate Hives and Swelling  . Fetzima [Levomilnacipran]     Tremors across head   .  Gadobutrol Hives  . Gadolinium Derivatives Hives  . Iodinated Diagnostic Agents   . Lamictal [Lamotrigine] Hives  . Naproxen Hypertension  . Nitrofurantoin Other (See Comments)  . Percolone [Oxycodone] Hives  . Relafen [Nabumetone] Hypertension  . Risperdal [Risperidone]     Leg weakness     Metabolic Disorder Labs: No results found for: HGBA1C, MPG No results found for: PROLACTIN No results found for: CHOL, TRIG, HDL, CHOLHDL, VLDL, LDLCALC   Current Medications: Current Outpatient Prescriptions  Medication Sig Dispense Refill  . albuterol (PROAIR HFA) 108 (90 Base) MCG/ACT inhaler Inhale two puffs every four to six hours as needed for cough or wheeze. 3 Inhaler 0  . albuterol (PROVENTIL) (2.5 MG/3ML) 0.083% nebulizer solution PLACE 1 VIAL IN NEBULIZER EVERY 6 HOURS AS NEEDED FOR WHEEZING UP TO 30 DAYS  5  . ARIPiprazole  (ABILIFY) 20 MG tablet Take 1 tablet (20 mg total) by mouth daily. 30 tablet 0  . aspirin 81 MG tablet Take 81 mg by mouth daily.    Marland Kitchen azithromycin (ZITHROMAX) 250 MG tablet Take one tablet by mouth three times daily 12 tablet 3  . BENICAR 40 MG tablet   4  . buPROPion (WELLBUTRIN XL) 300 MG 24 hr tablet TAKE 1 TABLET (300 MG TOTAL) BY MOUTH EVERY MORNING. 90 tablet 0  . carvedilol (COREG) 6.25 MG tablet Take 6.25 mg by mouth daily.    . cetirizine (ZYRTEC) 10 MG tablet Take 10 mg by mouth daily.    . Cholecalciferol (VITAMIN D-3) 5000 UNITS TABS Take 1 tablet by mouth daily.    . cyclobenzaprine (FLEXERIL) 10 MG tablet Take 10 mg by mouth 3 (three) times daily.  0  . diclofenac sodium (VOLTAREN) 1 % GEL Apply 1 g topically 2 (two) times daily.    Marland Kitchen dicyclomine (BENTYL) 10 MG capsule Take 10 mg by mouth 3 (three) times daily before meals.    . furosemide (LASIX) 20 MG tablet 20 mg. Take 1-2 tabs po up to twice a day as needed for edema    . hydrOXYzine (ATARAX/VISTARIL) 10 MG tablet Take 1 tablet (10 mg total) by mouth 3 (three) times daily as needed for anxiety. 270 tablet 0  . ketoconazole (NIZORAL) 2 % cream Apply 1 application topically daily.    Marland Kitchen ketoconazole (NIZORAL) 2 % shampoo Apply 1 application topically 2 (two) times a week.    . lubiprostone (AMITIZA) 24 MCG capsule Take 24 mcg by mouth 2 (two) times daily with a meal.    . meloxicam (MOBIC) 7.5 MG tablet Take 7.5 mg by mouth 2 (two) times daily.  0  . ondansetron (ZOFRAN-ODT) 4 MG disintegrating tablet Take 4 mg by mouth 2 (two) times daily as needed for nausea or vomiting.    . pregabalin (LYRICA) 75 MG capsule Take 75 mg by mouth 3 (three) times daily. Reported on 10/09/2015    . QVAR 80 MCG/ACT inhaler USE 1 PUFF DAILY TO PREVENT COUGH/WHEEZE-RINSE,GARGLE & SPIT AFTER USE-INCREASE TO 3 PUFFS W/FLAREUP 8.7 g 5  . RABEprazole (ACIPHEX) 20 MG tablet Take 20 mg by mouth daily.    . ranitidine (ZANTAC) 300 MG tablet Take 300 mg by  mouth at bedtime.    Marland Kitchen spironolactone (ALDACTONE) 25 MG tablet Take 25 mg by mouth daily.    . traMADol (ULTRAM) 50 MG tablet Take by mouth. 1-2 tabs po q6hr as needed for pain    . triamcinolone cream (KENALOG) 0.1 % Apply 1 application topically 2 (two) times  daily.    . vitamin B-12 (CYANOCOBALAMIN) 1000 MCG tablet Take 1,000 mcg by mouth daily.    Marland Kitchen Antiseborrheic Products, Misc. (PROMISEB EX) Apply topically.    Marland Kitchen azelastine (ASTELIN) 0.1 % nasal spray Place 2 sprays into both nostrils 2 (two) times daily. Use in each nostril as directed    . felodipine (PLENDIL) 5 MG 24 hr tablet Take 2.5 mg by mouth daily. Reported on 10/09/2015    . metoprolol succinate (TOPROL-XL) 50 MG 24 hr tablet     . metroNIDAZOLE (METROCREAM) 0.75 % cream Apply topically 2 (two) times daily.    . mometasone (NASONEX) 50 MCG/ACT nasal spray Use one spray in each nostril once daily as directed. (Patient not taking: Reported on 11/20/2016) 51 g 1   No current facility-administered medications for this visit.       Musculoskeletal: Strength & Muscle Tone: within normal limits Gait & Station: unsteady Patient leans: N/A  Psychiatric Specialty Exam: Review of Systems  Musculoskeletal: Positive for back pain and joint pain.  Neurological: Positive for sensory change and headaches. Negative for dizziness, seizures and loss of consciousness.    Blood pressure 128/82, pulse 70, height 5' 9.5" (1.765 m), weight 264 lb (119.7 kg).Body mass index is 38.43 kg/m.  General Appearance: Casual  Eye Contact:  Good  Speech:  Clear and Coherent and Normal Rate  Volume:  Normal  Mood:  Euthymic  Affect:  Congruent  Thought Process:  Goal Directed and Descriptions of Associations: Intact  Orientation:  Full (Time, Place, and Person)  Thought Content: Logical   Suicidal Thoughts:  No  Homicidal Thoughts:  No  Memory:  Immediate;   Good Recent;   Good Remote;   Good  Judgement:  Fair  Insight:  Fair  Psychomotor  Activity:  Normal  Concentration:  Concentration: Fair  Recall:  Good  Fund of Knowledge: Good  Language: Good  Akathisia:  No  Handed:  Right  AIMS (if indicated):  AIMS:  Facial and Oral Movements  Muscles of Facial Expression: None, normal  Lips and Perioral Area: None, normal  Jaw: None, normal  Tongue: None, normal Extremity Movements: Upper (arms, wrists, hands, fingers): None, normal  Lower (legs, knees, ankles, toes): None, normal,  Trunk Movements:  Neck, shoulders, hips: None, normal,  Overall Severity : Severity of abnormal movements (highest score from questions above): None, normal  Incapacitation due to abnormal movements: None, normal  Patient's awareness of abnormal movements (rate only patient's report): No Awareness, Dental Status  Current problems with teeth and/or dentures?: No  Does patient usually wear dentures?: No     Assets:  Communication Skills Desire for Improvement Housing Social Support  ADL's:  Intact  Cognition: WNL  Sleep:  good     Treatment Plan Summary:Medication management  Assessment: MDD- recurrent, moderate; Tourettes; Anxiety NOS   Medication management with supportive therapy. Risks/benefits and SE of the medication discussed. Pt verbalized understanding and verbal consent obtained for treatment.  Affirm with the patient that the medications are taken as ordered. Patient expressed understanding of how their medications were to be used.    Meds: Abilify 20mg  po qD for mood and tics Wellbutrin XR 300mg  po for depression. Higher doses caused HTN Vistaril 10mg  po TID prn anxiety   Labs: none     Therapy: brief supportive therapy provided. Discussed psychosocial stressors in detail.     Consultations:  Encouraged to continue individual therapy  Pt denies SI and is at an acute low risk  for suicide. Patient told to call clinic if any problems occur. Patient advised to go to ER if they should develop SI/HI, side effects, or  if symptoms worsen. Has crisis numbers to call if needed. Pt verbalized understanding.  F/up in 3 months or sooner if needed    Charlcie Cradle, MD 11/20/2016, 10:27 AM

## 2016-11-24 ENCOUNTER — Telehealth: Payer: Self-pay

## 2016-11-24 NOTE — Telephone Encounter (Signed)
Patient called stating that she started with flare-up of asthma and has restarted with her QVAR 80. She wanted to know how to use the QVAR during flare-up. I reminded her that she can use it three puffs three times daily during flare-up. I told her to let us know if she does not get to feeling better. Patient understood.

## 2016-11-26 ENCOUNTER — Telehealth: Payer: Self-pay

## 2016-11-26 NOTE — Telephone Encounter (Signed)
Patient called inquiring about a possible adverse interaction between her Azithromycin ( taking three times a week) and hydroxyzine ( taking TID).  Taking the two medications together can effect the QT interval. Patient says she has had some trouble with irregular heart beat in the past and just wanted to see if there might be another medication she can take in place of the Azithromycin. Please advise. I advised patient that we would call her back tomorrow ( Thursday).

## 2016-11-27 NOTE — Telephone Encounter (Signed)
Spoke with Penny Porter, she is going to talk to her other doctor about possibly switching the hydroxyzine to something else.

## 2016-11-27 NOTE — Telephone Encounter (Signed)
Please inform patient that there is the theoretical concern that the mixture of these 2 agents could result in prolonged QT syndrome. If she is having irregular heart rate or rhythm then it would be best to address the use of these agents. I believe that the azithromycin was prescribed by [facility] dermatology. May be she could first start by trying to taper down her hydroxyzine to a lower dose or taper off and see what happens with that manipulation.

## 2016-12-01 DIAGNOSIS — I7300 Raynaud's syndrome without gangrene: Secondary | ICD-10-CM | POA: Diagnosis not present

## 2016-12-01 DIAGNOSIS — I10 Essential (primary) hypertension: Secondary | ICD-10-CM | POA: Diagnosis not present

## 2016-12-01 DIAGNOSIS — I739 Peripheral vascular disease, unspecified: Secondary | ICD-10-CM | POA: Diagnosis not present

## 2016-12-11 DIAGNOSIS — J4540 Moderate persistent asthma, uncomplicated: Secondary | ICD-10-CM | POA: Diagnosis not present

## 2016-12-11 DIAGNOSIS — G4733 Obstructive sleep apnea (adult) (pediatric): Secondary | ICD-10-CM | POA: Diagnosis not present

## 2016-12-11 DIAGNOSIS — K209 Esophagitis, unspecified: Secondary | ICD-10-CM | POA: Diagnosis not present

## 2016-12-11 DIAGNOSIS — I10 Essential (primary) hypertension: Secondary | ICD-10-CM | POA: Diagnosis not present

## 2016-12-11 DIAGNOSIS — R5383 Other fatigue: Secondary | ICD-10-CM | POA: Diagnosis not present

## 2016-12-11 DIAGNOSIS — E538 Deficiency of other specified B group vitamins: Secondary | ICD-10-CM | POA: Diagnosis not present

## 2016-12-11 DIAGNOSIS — I878 Other specified disorders of veins: Secondary | ICD-10-CM | POA: Diagnosis not present

## 2016-12-11 DIAGNOSIS — F321 Major depressive disorder, single episode, moderate: Secondary | ICD-10-CM | POA: Diagnosis not present

## 2016-12-11 DIAGNOSIS — K5909 Other constipation: Secondary | ICD-10-CM | POA: Diagnosis not present

## 2016-12-11 DIAGNOSIS — E559 Vitamin D deficiency, unspecified: Secondary | ICD-10-CM | POA: Diagnosis not present

## 2016-12-11 DIAGNOSIS — E042 Nontoxic multinodular goiter: Secondary | ICD-10-CM | POA: Diagnosis not present

## 2016-12-11 DIAGNOSIS — S92352G Displaced fracture of fifth metatarsal bone, left foot, subsequent encounter for fracture with delayed healing: Secondary | ICD-10-CM | POA: Diagnosis not present

## 2016-12-11 DIAGNOSIS — M4696 Unspecified inflammatory spondylopathy, lumbar region: Secondary | ICD-10-CM | POA: Diagnosis not present

## 2016-12-11 DIAGNOSIS — F952 Tourette's disorder: Secondary | ICD-10-CM | POA: Diagnosis not present

## 2016-12-11 DIAGNOSIS — E782 Mixed hyperlipidemia: Secondary | ICD-10-CM | POA: Diagnosis not present

## 2016-12-11 DIAGNOSIS — F411 Generalized anxiety disorder: Secondary | ICD-10-CM | POA: Diagnosis not present

## 2016-12-11 DIAGNOSIS — M5030 Other cervical disc degeneration, unspecified cervical region: Secondary | ICD-10-CM | POA: Diagnosis not present

## 2016-12-11 DIAGNOSIS — M5136 Other intervertebral disc degeneration, lumbar region: Secondary | ICD-10-CM | POA: Diagnosis not present

## 2016-12-11 DIAGNOSIS — R5381 Other malaise: Secondary | ICD-10-CM | POA: Diagnosis not present

## 2016-12-11 DIAGNOSIS — G500 Trigeminal neuralgia: Secondary | ICD-10-CM | POA: Diagnosis not present

## 2016-12-15 DIAGNOSIS — I739 Peripheral vascular disease, unspecified: Secondary | ICD-10-CM | POA: Diagnosis not present

## 2016-12-15 DIAGNOSIS — R0989 Other specified symptoms and signs involving the circulatory and respiratory systems: Secondary | ICD-10-CM | POA: Diagnosis not present

## 2016-12-24 DIAGNOSIS — L299 Pruritus, unspecified: Secondary | ICD-10-CM | POA: Diagnosis not present

## 2016-12-24 DIAGNOSIS — L301 Dyshidrosis [pompholyx]: Secondary | ICD-10-CM | POA: Diagnosis not present

## 2016-12-29 DIAGNOSIS — M5136 Other intervertebral disc degeneration, lumbar region: Secondary | ICD-10-CM | POA: Diagnosis not present

## 2016-12-29 DIAGNOSIS — G629 Polyneuropathy, unspecified: Secondary | ICD-10-CM | POA: Diagnosis not present

## 2017-01-01 DIAGNOSIS — I739 Peripheral vascular disease, unspecified: Secondary | ICD-10-CM | POA: Diagnosis not present

## 2017-01-01 DIAGNOSIS — I6523 Occlusion and stenosis of bilateral carotid arteries: Secondary | ICD-10-CM | POA: Diagnosis not present

## 2017-01-06 DIAGNOSIS — M5136 Other intervertebral disc degeneration, lumbar region: Secondary | ICD-10-CM | POA: Diagnosis not present

## 2017-01-06 DIAGNOSIS — M5126 Other intervertebral disc displacement, lumbar region: Secondary | ICD-10-CM | POA: Diagnosis not present

## 2017-01-07 DIAGNOSIS — L301 Dyshidrosis [pompholyx]: Secondary | ICD-10-CM | POA: Diagnosis not present

## 2017-01-11 ENCOUNTER — Other Ambulatory Visit: Payer: Self-pay | Admitting: Allergy and Immunology

## 2017-01-12 DIAGNOSIS — R200 Anesthesia of skin: Secondary | ICD-10-CM | POA: Diagnosis not present

## 2017-01-12 DIAGNOSIS — F411 Generalized anxiety disorder: Secondary | ICD-10-CM | POA: Diagnosis not present

## 2017-01-12 DIAGNOSIS — F321 Major depressive disorder, single episode, moderate: Secondary | ICD-10-CM | POA: Diagnosis not present

## 2017-01-12 DIAGNOSIS — G629 Polyneuropathy, unspecified: Secondary | ICD-10-CM | POA: Diagnosis not present

## 2017-01-12 DIAGNOSIS — S92352K Displaced fracture of fifth metatarsal bone, left foot, subsequent encounter for fracture with nonunion: Secondary | ICD-10-CM | POA: Diagnosis not present

## 2017-01-13 DIAGNOSIS — M545 Low back pain: Secondary | ICD-10-CM | POA: Diagnosis not present

## 2017-01-13 DIAGNOSIS — G8929 Other chronic pain: Secondary | ICD-10-CM | POA: Diagnosis not present

## 2017-01-13 DIAGNOSIS — R200 Anesthesia of skin: Secondary | ICD-10-CM | POA: Diagnosis not present

## 2017-01-22 DIAGNOSIS — M79604 Pain in right leg: Secondary | ICD-10-CM | POA: Diagnosis not present

## 2017-01-22 DIAGNOSIS — R2689 Other abnormalities of gait and mobility: Secondary | ICD-10-CM | POA: Diagnosis not present

## 2017-01-22 DIAGNOSIS — M6281 Muscle weakness (generalized): Secondary | ICD-10-CM | POA: Diagnosis not present

## 2017-01-22 DIAGNOSIS — M79605 Pain in left leg: Secondary | ICD-10-CM | POA: Diagnosis not present

## 2017-01-22 DIAGNOSIS — M545 Low back pain: Secondary | ICD-10-CM | POA: Diagnosis not present

## 2017-01-22 DIAGNOSIS — G894 Chronic pain syndrome: Secondary | ICD-10-CM | POA: Diagnosis not present

## 2017-01-26 DIAGNOSIS — M6281 Muscle weakness (generalized): Secondary | ICD-10-CM | POA: Diagnosis not present

## 2017-01-26 DIAGNOSIS — M545 Low back pain: Secondary | ICD-10-CM | POA: Diagnosis not present

## 2017-01-26 DIAGNOSIS — R233 Spontaneous ecchymoses: Secondary | ICD-10-CM | POA: Diagnosis not present

## 2017-01-26 DIAGNOSIS — R2689 Other abnormalities of gait and mobility: Secondary | ICD-10-CM | POA: Diagnosis not present

## 2017-01-28 DIAGNOSIS — R2689 Other abnormalities of gait and mobility: Secondary | ICD-10-CM | POA: Diagnosis not present

## 2017-01-28 DIAGNOSIS — M6281 Muscle weakness (generalized): Secondary | ICD-10-CM | POA: Diagnosis not present

## 2017-01-28 DIAGNOSIS — M545 Low back pain: Secondary | ICD-10-CM | POA: Diagnosis not present

## 2017-02-03 DIAGNOSIS — R2689 Other abnormalities of gait and mobility: Secondary | ICD-10-CM | POA: Diagnosis not present

## 2017-02-03 DIAGNOSIS — M6281 Muscle weakness (generalized): Secondary | ICD-10-CM | POA: Diagnosis not present

## 2017-02-03 DIAGNOSIS — M545 Low back pain: Secondary | ICD-10-CM | POA: Diagnosis not present

## 2017-02-05 DIAGNOSIS — R2689 Other abnormalities of gait and mobility: Secondary | ICD-10-CM | POA: Diagnosis not present

## 2017-02-05 DIAGNOSIS — M6281 Muscle weakness (generalized): Secondary | ICD-10-CM | POA: Diagnosis not present

## 2017-02-05 DIAGNOSIS — M545 Low back pain: Secondary | ICD-10-CM | POA: Diagnosis not present

## 2017-02-09 ENCOUNTER — Emergency Department (HOSPITAL_COMMUNITY): Admission: EM | Admit: 2017-02-09 | Discharge: 2017-02-09 | Discharge status: Absent without leave | Payer: Self-pay

## 2017-02-09 DIAGNOSIS — F321 Major depressive disorder, single episode, moderate: Secondary | ICD-10-CM | POA: Diagnosis not present

## 2017-02-09 DIAGNOSIS — R2689 Other abnormalities of gait and mobility: Secondary | ICD-10-CM | POA: Diagnosis not present

## 2017-02-09 DIAGNOSIS — M545 Low back pain: Secondary | ICD-10-CM | POA: Diagnosis not present

## 2017-02-09 DIAGNOSIS — M1712 Unilateral primary osteoarthritis, left knee: Secondary | ICD-10-CM | POA: Diagnosis not present

## 2017-02-09 DIAGNOSIS — F411 Generalized anxiety disorder: Secondary | ICD-10-CM | POA: Diagnosis not present

## 2017-02-09 DIAGNOSIS — M6281 Muscle weakness (generalized): Secondary | ICD-10-CM | POA: Diagnosis not present

## 2017-02-10 DIAGNOSIS — R079 Chest pain, unspecified: Secondary | ICD-10-CM | POA: Diagnosis not present

## 2017-02-10 DIAGNOSIS — R0602 Shortness of breath: Secondary | ICD-10-CM | POA: Diagnosis not present

## 2017-02-10 DIAGNOSIS — R000 Tachycardia, unspecified: Secondary | ICD-10-CM | POA: Diagnosis not present

## 2017-02-10 DIAGNOSIS — I10 Essential (primary) hypertension: Secondary | ICD-10-CM | POA: Diagnosis not present

## 2017-02-13 DIAGNOSIS — M545 Low back pain: Secondary | ICD-10-CM | POA: Diagnosis not present

## 2017-02-13 DIAGNOSIS — M6281 Muscle weakness (generalized): Secondary | ICD-10-CM | POA: Diagnosis not present

## 2017-02-13 DIAGNOSIS — R2689 Other abnormalities of gait and mobility: Secondary | ICD-10-CM | POA: Diagnosis not present

## 2017-02-16 DIAGNOSIS — R2689 Other abnormalities of gait and mobility: Secondary | ICD-10-CM | POA: Diagnosis not present

## 2017-02-16 DIAGNOSIS — M6281 Muscle weakness (generalized): Secondary | ICD-10-CM | POA: Diagnosis not present

## 2017-02-16 DIAGNOSIS — M545 Low back pain: Secondary | ICD-10-CM | POA: Diagnosis not present

## 2017-02-18 ENCOUNTER — Encounter: Payer: Self-pay | Admitting: Allergy and Immunology

## 2017-02-18 ENCOUNTER — Ambulatory Visit (INDEPENDENT_AMBULATORY_CARE_PROVIDER_SITE_OTHER): Payer: PPO | Admitting: Allergy and Immunology

## 2017-02-18 VITALS — BP 112/72 | HR 89 | Resp 19 | Ht 69.0 in

## 2017-02-18 DIAGNOSIS — K219 Gastro-esophageal reflux disease without esophagitis: Secondary | ICD-10-CM

## 2017-02-18 DIAGNOSIS — J3089 Other allergic rhinitis: Secondary | ICD-10-CM | POA: Diagnosis not present

## 2017-02-18 DIAGNOSIS — J4520 Mild intermittent asthma, uncomplicated: Secondary | ICD-10-CM | POA: Diagnosis not present

## 2017-02-18 NOTE — Patient Instructions (Signed)
  1. Continue "action plan" including Qvar 80 REDIHALER SAMPLE two inhalation - 3 inhalations 3 times per day for asthma flare  2. Use Nasonex or OTC Rhinocort one spray each nostril 3-7 times per week during upper airway symptoms  3. Continue AcipHex 20 mg in the morning and ranitidine 300 mg in the evening  4. Continue ProAir HFA and antihistamine if needed  5. Return to clinic in 6 months or earlier if problem

## 2017-02-18 NOTE — Progress Notes (Signed)
Follow-up Note  Referring Provider: Raina Mina., MD Primary Provider: Raina Mina., MD Date of Office Visit: 02/18/2017  Subjective:   Penny Porter (DOB: Jun 20, 1966) is a 51 y.o. female who returns to the [facility] on 02/18/2017 in re-evaluation of the following:  HPI: Penny Porter returns to this clinic in reevaluation of her asthma and allergic rhinitis and reflux. Her last visit to this clinic was January 2018.  She has had an excellent interval of time and has not required a systemic steroid or antibiotic to treat her respiratory tract issue. She did need to activate her action plan for about one week on one occasion during the spring which apparently were quite successful in taking care of this flare. Apparently her trigger for that issue was an upper respiratory tract infection.  Her reflux is really doing quite well. She has not been having any significant problems with reflux or swallowing.  Allergies as of 02/18/2017      Reactions   Gadoversetamide Hives   Avelox [moxifloxacin Hcl In Nacl] Other (See Comments)   Caused pain in shoulders to finger, tendonitis   Moxifloxacin Other (See Comments)   Caused pain in shoulders to finger, tendonitis   Amoxicillin Hives   Bextra [valdecoxib] Hives, Swelling   Brintellix [vortioxetine] Nausea And Vomiting   Doxycycline Hyclate Hives, Swelling   Fetzima [levomilnacipran]    Tremors across head   Gadobutrol Hives   Gadolinium Derivatives Hives   Iodinated Diagnostic Agents    Lamictal [lamotrigine] Hives   Naproxen Hypertension   Nitrofurantoin Other (See Comments)   Percolone [oxycodone] Hives   Relafen [nabumetone] Hypertension   Risperdal [risperidone]    Leg weakness      Medication List      albuterol 108 (90 Base) MCG/ACT inhaler Commonly known as:  PROAIR HFA Inhale two puffs every four to six hours as needed for cough or wheeze.   albuterol (2.5 MG/3ML) 0.083% nebulizer solution Commonly known  as:  PROVENTIL PLACE 1 VIAL IN NEBULIZER EVERY 6 HOURS AS NEEDED FOR WHEEZING UP TO 30 DAYS   amLODipine 2.5 MG tablet Commonly known as:  NORVASC TAKE 1 TABLET (2.5 MG TOTAL) BY MOUTH DAILY.   ARIPiprazole 20 MG tablet Commonly known as:  ABILIFY Take 1 tablet (20 mg total) by mouth daily.   aspirin 81 MG tablet Take 81 mg by mouth daily.   azelastine 0.1 % nasal spray Commonly known as:  ASTELIN Place 2 sprays into both nostrils 2 (two) times daily. Use in each nostril as directed   azithromycin 250 MG tablet Commonly known as:  ZITHROMAX Take one tablet 3 times weekly   BENICAR 40 MG tablet Generic drug:  olmesartan   buPROPion 300 MG 24 hr tablet Commonly known as:  WELLBUTRIN XL TAKE 1 TABLET (300 MG TOTAL) BY MOUTH EVERY MORNING.   carvedilol 6.25 MG tablet Commonly known as:  COREG Take 6.25 mg by mouth daily.   cetirizine 10 MG tablet Commonly known as:  ZYRTEC Take 10 mg by mouth daily.   clotrimazole-betamethasone cream Commonly known as:  LOTRISONE APPLY SPARINGLY TO THE AFFECTED AREA(S) TWICE DAILY.   cyclobenzaprine 10 MG tablet Commonly known as:  FLEXERIL Take 10 mg by mouth 3 (three) times daily.   dicyclomine 10 MG capsule Commonly known as:  BENTYL Take 10 mg by mouth 3 (three) times daily before meals.   felodipine 5 MG 24 hr tablet Commonly known as:  PLENDIL Take 2.5  mg by mouth daily. Reported on 10/09/2015   furosemide 20 MG tablet Commonly known as:  LASIX 20 mg. Take 1-2 tabs po up to twice a day as needed for edema   hydrOXYzine 10 MG tablet Commonly known as:  ATARAX/VISTARIL Take 1 tablet (10 mg total) by mouth 3 (three) times daily as needed for anxiety.   ketoconazole 2 % shampoo Commonly known as:  NIZORAL Apply 1 application topically 2 (two) times a week.   ketoconazole 2 % cream Commonly known as:  NIZORAL Apply 1 application topically daily.   lubiprostone 24 MCG capsule Commonly known as:  AMITIZA Take 24 mcg by  mouth 2 (two) times daily with a meal.   meloxicam 7.5 MG tablet Commonly known as:  MOBIC Take 7.5 mg by mouth 2 (two) times daily.   metoprolol succinate 50 MG 24 hr tablet Commonly known as:  TOPROL-XL   metroNIDAZOLE 0.75 % cream Commonly known as:  METROCREAM Apply topically 2 (two) times daily.   mometasone 50 MCG/ACT nasal spray Commonly known as:  NASONEX Use one spray in each nostril once daily as directed.   ondansetron 4 MG disintegrating tablet Commonly known as:  ZOFRAN-ODT Take 4 mg by mouth 2 (two) times daily as needed for nausea or vomiting.   pregabalin 75 MG capsule Commonly known as:  LYRICA Take 75 mg by mouth 3 (three) times daily. Reported on 10/09/2015   PROMISEB EX Apply topically.   QVAR 80 MCG/ACT inhaler Generic drug:  beclomethasone USE 1 PUFF DAILY TO PREVENT COUGH/WHEEZE-RINSE,GARGLE & SPIT AFTER USE-INCREASE TO 3 PUFFS W/FLAREUP   RABEprazole 20 MG tablet Commonly known as:  ACIPHEX Take 20 mg by mouth daily.   ranitidine 300 MG tablet Commonly known as:  ZANTAC Take 300 mg by mouth at bedtime.   spironolactone 25 MG tablet Commonly known as:  ALDACTONE Take 25 mg by mouth daily.   traMADol 50 MG tablet Commonly known as:  ULTRAM Take by mouth. 1-2 tabs po q6hr as needed for pain   triamcinolone cream 0.1 % Commonly known as:  KENALOG Apply 1 application topically 2 (two) times daily.   vitamin B-12 1000 MCG tablet Commonly known as:  CYANOCOBALAMIN Take 1,000 mcg by mouth daily.   Vitamin D-3 5000 units Tabs Take 1 tablet by mouth daily.   VOLTAREN 1 % Gel Generic drug:  diclofenac sodium Apply 1 g topically 2 (two) times daily.       Past Medical History:  Diagnosis Date  . Arthritis    Back and hip  . Asthma   . Bursitis   . Costochondral chest pain   . Degenerative disk disease   . Depression   . Gastroparesis   . GERD (gastroesophageal reflux disease)   . Hypertension   . Lumbar herniated disc   . Sleep  apnea    not using CPAP  . Stenosis of lumbosacral spine   . Thyroid nodule   . Tourette disease   . Trigeminal nerve disease     Past Surgical History:  Procedure Laterality Date  . BACK SURGERY  September 19, 2015  . bone spur removal on back    . CHOLECYSTECTOMY  2005  . ELBOW SURGERY Left May 29, 2014  . KNEE SURGERY  06/2016  . polyp removal    . ruptured disk  2008  . TEAR DUCT PROBING  2009    Review of systems negative except as noted in HPI / PMHx or noted below:  Review of Systems  Constitutional: Negative.   HENT: Negative.   Eyes: Negative.   Respiratory: Negative.   Cardiovascular: Negative.   Gastrointestinal: Negative.   Genitourinary: Negative.   Musculoskeletal: Negative.   Skin: Negative.   Neurological: Negative.   Endo/Heme/Allergies: Negative.   Psychiatric/Behavioral: Negative.      Objective:   Vitals:   02/18/17 1326  BP: 112/72  Pulse: 89  Resp: 19   Height: 5\' 9"  (175.3 cm)      Physical Exam  Constitutional: She is well-developed, well-nourished, and in no distress.  HENT:  Head: Normocephalic.  Right Ear: Tympanic membrane, external ear and ear canal normal.  Left Ear: Tympanic membrane, external ear and ear canal normal.  Nose: Nose normal. No mucosal edema or rhinorrhea.  Mouth/Throat: Uvula is midline, oropharynx is clear and moist and mucous membranes are normal. No oropharyngeal exudate.  Eyes: Conjunctivae are normal.  Neck: Trachea normal. No tracheal tenderness present. No tracheal deviation present. No thyromegaly present.  Cardiovascular: Normal rate, regular rhythm, S1 normal, S2 normal and normal heart sounds.   No murmur heard. Pulmonary/Chest: Breath sounds normal. No stridor. No respiratory distress. She has no wheezes. She has no rales.  Musculoskeletal: She exhibits no edema.  Lymphadenopathy:       Head (right side): No tonsillar adenopathy present.       Head (left side): No tonsillar adenopathy present.     She has no cervical adenopathy.  Neurological: She is alert. Gait normal.  Skin: No rash noted. She is not diaphoretic. No erythema. Nails show no clubbing.  Psychiatric: Mood and affect normal.    Diagnostics:    Spirometry was performed and demonstrated an FEV1 of 1.47 at 45 % of predicted. She had a less than optimal effort on the spirometric maneuver.  The patient had an Asthma Control Test with the following results: ACT Total Score: 22.    Assessment and Plan:   1. Asthma, mild intermittent, well-controlled   2. Other allergic rhinitis   3. LPRD (laryngopharyngeal reflux disease)     1. Continue "action plan" including Qvar 80 REDIHALER SAMPLE two inhalation - 3 inhalations 3 times per day for asthma flare  2. Use Nasonex or OTC Rhinocort one spray each nostril 3-7 times per week during upper airway symptoms  3. Continue AcipHex 20 mg in the morning and ranitidine 300 mg in the evening  4. Continue ProAir HFA and antihistamine if needed  5. Return to clinic in 6 months or earlier if problem  Taela appears to be doing relatively well. I have given her a sample of Qvar 80 without a prescription which should cover her for the rest of this coming year given the fact that she rarely has exacerbations of her asthma. She can use a nasal steroid as needed during periods of upper airway symptoms and she can continue to treat reflux as noted above. She will obtain the flu vaccine this fall and I will see her back in this clinic in 6 months or earlier if there is a problem.  Allena Katz, MD Allergy / Immunology [place]

## 2017-02-19 DIAGNOSIS — R2689 Other abnormalities of gait and mobility: Secondary | ICD-10-CM | POA: Diagnosis not present

## 2017-02-19 DIAGNOSIS — S92352G Displaced fracture of fifth metatarsal bone, left foot, subsequent encounter for fracture with delayed healing: Secondary | ICD-10-CM | POA: Diagnosis not present

## 2017-02-19 DIAGNOSIS — R0609 Other forms of dyspnea: Secondary | ICD-10-CM | POA: Diagnosis not present

## 2017-02-19 DIAGNOSIS — M6281 Muscle weakness (generalized): Secondary | ICD-10-CM | POA: Diagnosis not present

## 2017-02-19 DIAGNOSIS — R000 Tachycardia, unspecified: Secondary | ICD-10-CM | POA: Insufficient documentation

## 2017-02-19 DIAGNOSIS — E042 Nontoxic multinodular goiter: Secondary | ICD-10-CM | POA: Diagnosis not present

## 2017-02-19 DIAGNOSIS — Z6838 Body mass index (BMI) 38.0-38.9, adult: Secondary | ICD-10-CM | POA: Diagnosis not present

## 2017-02-19 DIAGNOSIS — M545 Low back pain: Secondary | ICD-10-CM | POA: Diagnosis not present

## 2017-02-19 DIAGNOSIS — I10 Essential (primary) hypertension: Secondary | ICD-10-CM | POA: Diagnosis not present

## 2017-02-23 DIAGNOSIS — I10 Essential (primary) hypertension: Secondary | ICD-10-CM | POA: Diagnosis not present

## 2017-02-23 DIAGNOSIS — Z6837 Body mass index (BMI) 37.0-37.9, adult: Secondary | ICD-10-CM | POA: Diagnosis not present

## 2017-02-23 DIAGNOSIS — M545 Low back pain: Secondary | ICD-10-CM | POA: Diagnosis not present

## 2017-02-23 DIAGNOSIS — G629 Polyneuropathy, unspecified: Secondary | ICD-10-CM | POA: Diagnosis not present

## 2017-02-25 DIAGNOSIS — I10 Essential (primary) hypertension: Secondary | ICD-10-CM | POA: Diagnosis not present

## 2017-02-25 DIAGNOSIS — R002 Palpitations: Secondary | ICD-10-CM | POA: Insufficient documentation

## 2017-02-25 DIAGNOSIS — Z6837 Body mass index (BMI) 37.0-37.9, adult: Secondary | ICD-10-CM | POA: Diagnosis not present

## 2017-02-26 ENCOUNTER — Ambulatory Visit (INDEPENDENT_AMBULATORY_CARE_PROVIDER_SITE_OTHER): Payer: PPO | Admitting: Psychiatry

## 2017-02-26 ENCOUNTER — Encounter (HOSPITAL_COMMUNITY): Payer: Self-pay | Admitting: Psychiatry

## 2017-02-26 DIAGNOSIS — F411 Generalized anxiety disorder: Secondary | ICD-10-CM

## 2017-02-26 DIAGNOSIS — F952 Tourette's disorder: Secondary | ICD-10-CM

## 2017-02-26 DIAGNOSIS — M6281 Muscle weakness (generalized): Secondary | ICD-10-CM | POA: Diagnosis not present

## 2017-02-26 DIAGNOSIS — F332 Major depressive disorder, recurrent severe without psychotic features: Secondary | ICD-10-CM

## 2017-02-26 DIAGNOSIS — Z79899 Other long term (current) drug therapy: Secondary | ICD-10-CM

## 2017-02-26 DIAGNOSIS — R2689 Other abnormalities of gait and mobility: Secondary | ICD-10-CM | POA: Diagnosis not present

## 2017-02-26 DIAGNOSIS — R51 Headache: Secondary | ICD-10-CM

## 2017-02-26 DIAGNOSIS — M545 Low back pain: Secondary | ICD-10-CM | POA: Diagnosis not present

## 2017-02-26 DIAGNOSIS — G4700 Insomnia, unspecified: Secondary | ICD-10-CM

## 2017-02-26 DIAGNOSIS — Z810 Family history of intellectual disabilities: Secondary | ICD-10-CM | POA: Diagnosis not present

## 2017-02-26 MED ORDER — BUPROPION HCL ER (XL) 300 MG PO TB24: ORAL_TABLET | ORAL | 3 refills | Status: DC

## 2017-02-26 MED ORDER — HYDROXYZINE HCL 10 MG PO TABS: 10.0000 mg | ORAL_TABLET | Freq: Three times a day (TID) | ORAL | 3 refills | Status: DC | PRN

## 2017-02-26 MED ORDER — ARIPIPRAZOLE 20 MG PO TABS: 20.0000 mg | ORAL_TABLET | Freq: Every day | ORAL | 3 refills | Status: DC

## 2017-02-26 NOTE — Progress Notes (Signed)
[place] MD/PA/NP OP Progress Note  02/26/2017 10:19 AM Penny Porter  MRN:  [id number]  Chief Complaint:  Chief Complaint    Follow-up      HPI: Pt is having issues wit her BP. She went to the ED for sinus tachycardia. She went to her cardiologist PA yesterday. Pt will follow up with the cardiologist next week.   Depression comes and goes for a day several times a month. Last week she had a bad day and she was lethargic, unmotivated, wanting to isolate. Pt denies SI/HI. Other days she is a little down but no where near as down. Pt is no longer reading but she does color a couple of times a week.  Her mom has cancer and hospice is helping twice a week. She states "I am watching her die". Sleep is poor as she is caring for her mother all day and night. This has caused her anxiety to spike. It comes and goes thru out the week.  Tics come on when she is tired and anxious (like at the store). Stomach tics are constant and she doesn't expect to change.   Taking meds as prescribed and denies SE.   Visit Diagnosis:    ICD-10-CM   1. Major depressive disorder, recurrent, severe without psychotic features ([place]) F33.2 ARIPiprazole (ABILIFY) 20 MG tablet    buPROPion (WELLBUTRIN XL) 300 MG 24 hr tablet  2. Tourette's disease F95.2 ARIPiprazole (ABILIFY) 20 MG tablet  3. Anxiety state F41.1 hydrOXYzine (ATARAX/VISTARIL) 10 MG tablet      Past Psychiatric History:  Anxiety: No Bipolar Disorder: No Depression: Yes Mania: No Psychosis: No Schizophrenia: No Personality Disorder: No Hospitalization for psychiatric illness: No History of Electroconvulsive Shock Therapy: No Prior Suicide Attempts: No  Past Medical History:  Past Medical History:  Diagnosis Date  . Arthritis    Back and hip  . Asthma   . Bursitis   . Costochondral chest pain   . Degenerative disk disease   . Depression   . Gastroparesis   . GERD (gastroesophageal reflux disease)   . Hypertension   . Lumbar herniated disc    . Sleep apnea    not using CPAP  . Stenosis of lumbosacral spine   . Thyroid nodule   . Tourette disease   . Trigeminal nerve disease     Past Surgical History:  Procedure Laterality Date  . BACK SURGERY  September 19, 2015  . bone spur removal on back    . CHOLECYSTECTOMY  2005  . ELBOW SURGERY Left May 29, 2014  . KNEE SURGERY  06/2016  . polyp removal    . ruptured disk  2008  . TEAR DUCT PROBING  2009    Family Psychiatric History: Family History  Problem Relation Age of Onset  . Mental retardation Sister   . Dementia Father   . Suicidality Neg Hx     Social History:  Social History   Social History  . Marital status: Single    Spouse name: N/A  . Number of children: N/A  . Years of education: N/A   Social History Main Topics  . Smoking status: Never Smoker  . Smokeless tobacco: Never Used  . Alcohol use No  . Drug use: No  . Sexual activity: Not Currently   Other Topics Concern  . None   Social History Narrative  . None    Allergies:  Allergies  Allergen Reactions  . Gadoversetamide Hives  . Avelox [Moxifloxacin Hcl In Nacl] Other (  See Comments)    Caused pain in shoulders to finger, tendonitis  . Moxifloxacin Other (See Comments)    Caused pain in shoulders to finger, tendonitis  . Amoxicillin Hives  . Bextra [Valdecoxib] Hives and Swelling  . Brintellix [Vortioxetine] Nausea And Vomiting  . Doxycycline Hyclate Hives and Swelling  . Fetzima [Levomilnacipran]     Tremors across head   . Gadobutrol Hives  . Gadolinium Derivatives Hives  . Iodinated Diagnostic Agents   . Lamictal [Lamotrigine] Hives  . Naproxen Hypertension  . Nitrofurantoin Other (See Comments)  . Percolone [Oxycodone] Hives  . Relafen [Nabumetone] Hypertension  . Risperdal [Risperidone]     Leg weakness     Metabolic Disorder Labs: No results found for: HGBA1C, MPG No results found for: PROLACTIN No results found for: CHOL, TRIG, HDL, CHOLHDL, VLDL, LDLCALC    Current Medications: Current Outpatient Prescriptions  Medication Sig Dispense Refill  . albuterol (PROAIR HFA) 108 (90 Base) MCG/ACT inhaler Inhale two puffs every four to six hours as needed for cough or wheeze. 3 Inhaler 0  . albuterol (PROVENTIL) (2.5 MG/3ML) 0.083% nebulizer solution PLACE 1 VIAL IN NEBULIZER EVERY 6 HOURS AS NEEDED FOR WHEEZING UP TO 30 DAYS  5  . amLODipine (NORVASC) 2.5 MG tablet TAKE 1 TABLET (2.5 MG TOTAL) BY MOUTH DAILY.  2  . Antiseborrheic Products, Misc. (PROMISEB EX) Apply topically.    . ARIPiprazole (ABILIFY) 20 MG tablet Take 1 tablet (20 mg total) by mouth daily. 30 tablet 3  . aspirin 81 MG tablet Take 81 mg by mouth daily.    Marland Kitchen azelastine (ASTELIN) 0.1 % nasal spray Place 2 sprays into both nostrils 2 (two) times daily. Use in each nostril as directed    . azithromycin (ZITHROMAX) 250 MG tablet Take one tablet 3 times weekly 12 tablet 0  . BENICAR 40 MG tablet   4  . buPROPion (WELLBUTRIN XL) 300 MG 24 hr tablet TAKE 1 TABLET (300 MG TOTAL) BY MOUTH EVERY MORNING. 90 tablet 3  . carvedilol (COREG) 6.25 MG tablet Take 6.25 mg by mouth daily.    . cetirizine (ZYRTEC) 10 MG tablet Take 10 mg by mouth daily.    . Cholecalciferol (VITAMIN D-3) 5000 UNITS TABS Take 1 tablet by mouth daily.    . clotrimazole-betamethasone (LOTRISONE) cream APPLY SPARINGLY TO THE AFFECTED AREA(S) TWICE DAILY.  5  . cyclobenzaprine (FLEXERIL) 10 MG tablet Take 10 mg by mouth 3 (three) times daily.  0  . diclofenac sodium (VOLTAREN) 1 % GEL Apply 1 g topically 2 (two) times daily.    Marland Kitchen dicyclomine (BENTYL) 10 MG capsule Take 10 mg by mouth 3 (three) times daily before meals.    . felodipine (PLENDIL) 5 MG 24 hr tablet Take 2.5 mg by mouth daily. Reported on 10/09/2015    . furosemide (LASIX) 20 MG tablet 20 mg. Take 1-2 tabs po up to twice a day as needed for edema    . hydrOXYzine (ATARAX/VISTARIL) 10 MG tablet Take 1 tablet (10 mg total) by mouth 3 (three) times daily as needed  for anxiety. 90 tablet 3  . ketoconazole (NIZORAL) 2 % cream Apply 1 application topically daily.    Marland Kitchen ketoconazole (NIZORAL) 2 % shampoo Apply 1 application topically 2 (two) times a week.    . lubiprostone (AMITIZA) 24 MCG capsule Take 24 mcg by mouth 2 (two) times daily with a meal.    . meloxicam (MOBIC) 7.5 MG tablet Take 7.5 mg by mouth 2 (  two) times daily.  0  . metoprolol succinate (TOPROL-XL) 50 MG 24 hr tablet     . metroNIDAZOLE (METROCREAM) 0.75 % cream Apply topically 2 (two) times daily.    . mometasone (NASONEX) 50 MCG/ACT nasal spray Use one spray in each nostril once daily as directed. (Patient not taking: Reported on 11/20/2016) 51 g 1  . ondansetron (ZOFRAN-ODT) 4 MG disintegrating tablet Take 4 mg by mouth 2 (two) times daily as needed for nausea or vomiting.    . pregabalin (LYRICA) 75 MG capsule Take 75 mg by mouth 3 (three) times daily. Reported on 10/09/2015    . QVAR 80 MCG/ACT inhaler USE 1 PUFF DAILY TO PREVENT COUGH/WHEEZE-RINSE,GARGLE & SPIT AFTER USE-INCREASE TO 3 PUFFS W/FLAREUP 8.7 g 5  . RABEprazole (ACIPHEX) 20 MG tablet Take 20 mg by mouth daily.    . ranitidine (ZANTAC) 300 MG tablet Take 300 mg by mouth at bedtime.    Marland Kitchen spironolactone (ALDACTONE) 25 MG tablet Take 25 mg by mouth daily.    . traMADol (ULTRAM) 50 MG tablet Take by mouth. 1-2 tabs po q6hr as needed for pain    . triamcinolone cream (KENALOG) 0.1 % Apply 1 application topically 2 (two) times daily.    . vitamin B-12 (CYANOCOBALAMIN) 1000 MCG tablet Take 1,000 mcg by mouth daily.     No current facility-administered medications for this visit.      Musculoskeletal: Strength & Muscle Tone: decreased Gait & Station: broad based, walks with cane Patient leans: N/A  Psychiatric Specialty Exam: Review of Systems  Eyes: Negative for blurred vision, double vision and pain.  Neurological: Positive for dizziness and headaches. Negative for tremors and sensory change.  Psychiatric/Behavioral:  Positive for depression. Negative for hallucinations, substance abuse and suicidal ideas. The patient is nervous/anxious and has insomnia.     Blood pressure 132/78, pulse 83, height 5' 9.5" (1.765 m), weight 261 lb (118.4 kg).Body mass index is 37.99 kg/m.  General Appearance: Fairly Groomed  Eye Contact:  Good  Speech:  Clear and Coherent and Normal Rate  Volume:  Normal  Mood:  Anxious  Affect:  Congruent  Thought Process:  Goal Directed and Descriptions of Associations: Intact  Orientation:  Full (Time, Place, and Person)  Thought Content: Logical   Suicidal Thoughts:  No  Homicidal Thoughts:  No  Memory:  Immediate;   Good Recent;   Good Remote;   Good  Judgement:  Good  Insight:  Good  Psychomotor Activity:  Normal  Concentration:  Concentration: Good and Attention Span: Good  Recall:  Good  Fund of Knowledge: Good  Language: Good  Akathisia:  No  Handed:  Right  AIMS (if indicated):  AIMS:  Facial and Oral Movements  Muscles of Facial Expression: None, normal  Lips and Perioral Area: None, normal  Jaw: None, normal  Tongue: None, normal Extremity Movements: Upper (arms, wrists, hands, fingers): None, normal  Lower (legs, knees, ankles, toes): None, normal,  Trunk Movements:  Neck, shoulders, hips: None, normal,  Overall Severity : Severity of abnormal movements (highest score from questions above): None, normal  Incapacitation due to abnormal movements: None, normal  Patient's awareness of abnormal movements (rate only patient's report): No Awareness, Dental Status  Current problems with teeth and/or dentures?: No  Does patient usually wear dentures?: No     Assets:  Communication Skills Desire for Improvement Housing Social Support  ADL's:  Intact  Cognition: WNL  Sleep:  poor     Treatment Plan Summary:Medication  management  Assessment: MDD-recurrent, moderate; Tourettes; Anxiety   Medication management with supportive therapy. Risks/benefits and  SE of the medication discussed. Pt verbalized understanding and verbal consent obtained for treatment.  Affirm with the patient that the medications are taken as ordered. Patient expressed understanding of how their medications were to be used.    Meds: Abilify 20mg  po qD for mood and tics Wellbutrin XR 300mg  po qD for depression. Higher doses cause HTN Vistaril 10mg  po TID prn anxiety   Labs:ordered CBC, CMP, HbA1c, Lipid panel, TSH, Prolactin level, EKG   Therapy: brief supportive therapy provided. Discussed psychosocial stressors in detail.     Consultations: encouraged to follow up with therapist -encouraged to follow up with PCP  Pt denies SI and is at an acute low risk for suicide. Patient told to call clinic if any problems occur. Patient advised to go to ER if they should develop SI/HI, side effects, or if symptoms worsen. Has crisis numbers to call if needed. Pt verbalized understanding.  F/up in 2 months or sooner if needed   Charlcie Cradle, MD 02/26/2017, 10:19 AM

## 2017-03-02 DIAGNOSIS — F321 Major depressive disorder, single episode, moderate: Secondary | ICD-10-CM | POA: Diagnosis not present

## 2017-03-02 DIAGNOSIS — F411 Generalized anxiety disorder: Secondary | ICD-10-CM | POA: Diagnosis not present

## 2017-03-03 DIAGNOSIS — M545 Low back pain: Secondary | ICD-10-CM | POA: Diagnosis not present

## 2017-03-03 DIAGNOSIS — R2689 Other abnormalities of gait and mobility: Secondary | ICD-10-CM | POA: Diagnosis not present

## 2017-03-03 DIAGNOSIS — M6281 Muscle weakness (generalized): Secondary | ICD-10-CM | POA: Diagnosis not present

## 2017-03-03 DIAGNOSIS — Z1231 Encounter for screening mammogram for malignant neoplasm of breast: Secondary | ICD-10-CM | POA: Diagnosis not present

## 2017-03-03 DIAGNOSIS — Z01419 Encounter for gynecological examination (general) (routine) without abnormal findings: Secondary | ICD-10-CM | POA: Diagnosis not present

## 2017-03-09 DIAGNOSIS — Z1231 Encounter for screening mammogram for malignant neoplasm of breast: Secondary | ICD-10-CM | POA: Diagnosis not present

## 2017-03-10 ENCOUNTER — Other Ambulatory Visit: Payer: Self-pay | Admitting: *Deleted

## 2017-03-10 ENCOUNTER — Encounter: Payer: Self-pay | Admitting: *Deleted

## 2017-03-10 NOTE — Patient Outreach (Signed)
HTA THN Screen. Pt is doing well. She recently had an episode of Vtach and went to the ED. She is now working with a cardiologist. She sees her primary care MD routinely. She also has a GI Specialist. Takes lots of medications as ordered for chronic problems. Many seem to be duplications. No acute health concerns. I am referring her for a pharmacy consult.  Introductory letter sent.  Eulah Pont. Myrtie Neither, MSN, [facility] Gerontological Nurse Practitioner [facility] [phone]

## 2017-03-11 DIAGNOSIS — L03314 Cellulitis of groin: Secondary | ICD-10-CM | POA: Diagnosis not present

## 2017-03-11 DIAGNOSIS — R000 Tachycardia, unspecified: Secondary | ICD-10-CM | POA: Diagnosis not present

## 2017-03-11 DIAGNOSIS — I10 Essential (primary) hypertension: Secondary | ICD-10-CM | POA: Diagnosis not present

## 2017-03-11 DIAGNOSIS — B356 Tinea cruris: Secondary | ICD-10-CM | POA: Diagnosis not present

## 2017-03-18 DIAGNOSIS — I10 Essential (primary) hypertension: Secondary | ICD-10-CM | POA: Diagnosis not present

## 2017-03-18 DIAGNOSIS — L301 Dyshidrosis [pompholyx]: Secondary | ICD-10-CM | POA: Diagnosis not present

## 2017-03-18 DIAGNOSIS — L570 Actinic keratosis: Secondary | ICD-10-CM | POA: Diagnosis not present

## 2017-03-18 DIAGNOSIS — R000 Tachycardia, unspecified: Secondary | ICD-10-CM | POA: Diagnosis not present

## 2017-03-18 DIAGNOSIS — Z6838 Body mass index (BMI) 38.0-38.9, adult: Secondary | ICD-10-CM | POA: Diagnosis not present

## 2017-03-18 DIAGNOSIS — R0609 Other forms of dyspnea: Secondary | ICD-10-CM | POA: Diagnosis not present

## 2017-03-18 DIAGNOSIS — R002 Palpitations: Secondary | ICD-10-CM | POA: Diagnosis not present

## 2017-03-23 ENCOUNTER — Other Ambulatory Visit: Payer: Self-pay | Admitting: Pharmacist

## 2017-03-23 NOTE — Patient Outreach (Signed)
Penny Porter is referred to pharmacy for medication review and to discuss medication assistance options. Called and spoke with patient. HIPAA identifiers verified and verbal consent received.  [name] reports that she would like to complete this medication review with me, but that now is not a good time. Schedule an appointment with patient for Wednesday, 03/25/17 at 11:30 am.  Harlow Asa, PharmD, [facility] [phone]

## 2017-03-24 ENCOUNTER — Other Ambulatory Visit: Payer: Self-pay | Admitting: Allergy and Immunology

## 2017-03-25 ENCOUNTER — Other Ambulatory Visit: Payer: Self-pay | Admitting: Pharmacist

## 2017-03-25 NOTE — Patient Outreach (Signed)
[facility]) Care Management  [place]   03/25/2017  Telissa Palmisano Jul 16, 1966 [id number]  Subjective: Penny Porter is referred to pharmacy for medication review and to discuss medication assistance options. Called and spoke with patient. HIPAA identifiers verified and verbal consent received.  Review with Penny Porter each of her medications including name, strength, administration and indication. Penny Porter reports that she organizes her medications using three weekly pillboxes. Denies any difficulty with affording medications at this time as she currently has full extra help subsidy.  Patient reports that she only uses her Qvar inhaler during times when she needs it, but uses it consistently at those times as directed by her pulmonologist. Penny Porter that she rinses out her mouth after each use. Patient reports that she keeps her albuterol inhaler with her and that this inhaler is in-date.  Reports that her blood pressure has been occasionally low in the mornings, including this morning. Reports that her blood pressure this morning was 99/72; with a pulse of 66. Denies any dizziness with these lows, but states that she will sometimes "feel out of it" with these blood pressures. Reports that she discussed these lows with her Cardiologist at her last appointment on 03/18/17. Per patient, Dr. Wyline Copas instructed that when her blood pressure is low, she should take her morning blood pressure medication doses of carvedilol and spironolactone 2 hours later. Counsel patient to continue to keep her blood pressure log and to follow up again with Dr. Wyline Copas with these numbers if she continues to have lows and/or symptoms of low blood pressures.  Reports that in the past she has also had episodes of tachycardia. Reports that in July she saw Dr Wyline Copas for this issue. States that she had an EKG and was instructed that she should let him know if she ever has an episode of tachycardia lasting more than 30  minutes. Patient expresses concern about these episodes as she reports that she has had a couple recently, but that they did not last more that 30 minutes. Counsel patient that she can always call her Cardiologist's office and speak with the nurse there to receive advice/leave a message for her Cardiologist. Penny Porter states that she will.  Counsel patient on risk of CNS depression with hydroxyzine, tramadol, Astelin, Lyrica, loratadine and Abilify, particularly if these medications are taken in combination. Patient verbalizes understanding. Reports that she is not currently taking tramadol. Denies any dizziness or sedation with these medications. Denies any falls in the last year.   Reports that she has not had to use dicyclomine or Amizita in months as her bowels have been doing well.  Penny Porter denies any further medication questions/concerns at this time. Provide patient with my phone number.  Objective:   Encounter Medications: Outpatient Encounter Prescriptions as of 03/25/2017  Medication Sig Note  . albuterol (PROAIR HFA) 108 (90 Base) MCG/ACT inhaler Inhale two puffs every four to six hours as needed for cough or wheeze.   Marland Kitchen albuterol (PROVENTIL) (2.5 MG/3ML) 0.083% nebulizer solution PLACE 1 VIAL IN NEBULIZER EVERY 6 HOURS AS NEEDED FOR WHEEZING UP TO 30 DAYS 11/30/2015: Received from: External Pharmacy  . ARIPiprazole (ABILIFY) 20 MG tablet Take 1 tablet (20 mg total) by mouth daily.   Marland Kitchen aspirin 81 MG tablet Take 81 mg by mouth daily.   Marland Kitchen azelastine (ASTELIN) 0.1 % nasal spray Place 2 sprays into both nostrils 2 (two) times daily. Use in each nostril as directed 03/10/2017: Takes this or Nasonex which  ever she has but never both.  Marland Kitchen azithromycin (ZITHROMAX) 250 MG tablet TAKE ONE TABLET 3 TIMES WEEKLY 03/25/2017: Takes on Monday, Wednesday and Friday  . BENICAR 40 MG tablet  03/25/2017: Takes 1 tablet once daily at lunch time   . buPROPion (WELLBUTRIN XL) 300 MG 24 hr tablet TAKE 1 TABLET  (300 MG TOTAL) BY MOUTH EVERY MORNING.   . carvedilol (COREG) 6.25 MG tablet Take 6.25 mg by mouth daily. 03/25/2017: 6.25 mg twice daily (breakfast and at bedtime)  . Cholecalciferol (VITAMIN D-3) 5000 UNITS TABS Take 1 tablet by mouth daily.   . clotrimazole-betamethasone (LOTRISONE) cream APPLY SPARINGLY TO THE AFFECTED AREA(S) TWICE DAILY.   . furosemide (LASIX) 20 MG tablet 20 mg. Take 1-2 tabs po up to twice a day as needed for edema 03/25/2017: Taking 1 tablet (20 mg) once daily  . hydrOXYzine (ATARAX/VISTARIL) 10 MG tablet Take 1 tablet (10 mg total) by mouth 3 (three) times daily as needed for anxiety. 03/25/2017: Usually takes twice daily  . ketoconazole (NIZORAL) 2 % shampoo Apply 1 application topically 2 (two) times a week. 11/20/2016: Takes as needed  . loratadine (CLARITIN) 10 MG tablet Take 10 mg by mouth daily.   . meloxicam (MOBIC) 7.5 MG tablet Take 7.5 mg by mouth 2 (two) times daily. 10/22/2015: Received from: External Pharmacy  . ondansetron (ZOFRAN-ODT) 4 MG disintegrating tablet Take 4 mg by mouth 2 (two) times daily as needed for nausea or vomiting.   . pregabalin (LYRICA) 75 MG capsule Take 75 mg by mouth 3 (three) times daily. Reported on 10/09/2015 03/25/2017: Taking twice daily as needed  . QVAR 80 MCG/ACT inhaler USE 1 PUFF DAILY TO PREVENT COUGH/WHEEZE-RINSE,GARGLE & SPIT AFTER USE-INCREASE TO 3 PUFFS W/FLAREUP   . RABEprazole (ACIPHEX) 20 MG tablet Take 20 mg by mouth daily.   . ranitidine (ZANTAC) 300 MG tablet Take 300 mg by mouth at bedtime.   Marland Kitchen spironolactone (ALDACTONE) 25 MG tablet Take 25 mg by mouth daily.   Marland Kitchen triamcinolone cream (KENALOG) 0.1 % Apply 1 application topically 2 (two) times daily.   Marland Kitchen triamcinolone ointment (KENALOG) 0.5 % Apply 1 application topically 2 (two) times daily.   . vitamin B-12 (CYANOCOBALAMIN) 1000 MCG tablet Take 1,000 mcg by mouth daily.   . diclofenac sodium (VOLTAREN) 1 % GEL Apply 1 g topically 2 (two) times daily.   Marland Kitchen dicyclomine  (BENTYL) 10 MG capsule Take 10 mg by mouth 3 (three) times daily before meals. 11/20/2016: Takes as needed  . lubiprostone (AMITIZA) 24 MCG capsule Take 24 mcg by mouth 2 (two) times daily with a meal. 03/25/2017: Taking 1 tablet once daily as needed.  . metroNIDAZOLE (METROCREAM) 0.75 % cream Apply topically 2 (two) times daily.   . mupirocin ointment (BACTROBAN) 2 % Place 1 application into the nose 2 (two) times daily.   . traMADol (ULTRAM) 50 MG tablet Take by mouth. 1-2 tabs po q6hr as needed for pain   . [place]. (PROMISEB EX) Apply topically.   . [DISCONTINUED] cyclobenzaprine (FLEXERIL) 10 MG tablet Take 10 mg by mouth 3 (three) times daily. 10/22/2015: Received from: External Pharmacy  . [DISCONTINUED] ketoconazole (NIZORAL) 2 % cream Apply 1 application topically daily.   . [DISCONTINUED] mometasone (NASONEX) 50 MCG/ACT nasal spray Use one spray in each nostril once daily as directed. (Patient not taking: Reported on 03/10/2017)    No facility-administered encounter medications on file as of 03/25/2017.     Assessment:  Drugs sorted  by system:  Neurologic/Psychologic: Abilify, bupropion XL, hydroxyzine  Cardiovascular: aspirin, Benicar, carvedilol, furosemide, spironolactone  Pulmonary/Allergy: albuterol inhaler, albuterol nebulizer solution, azelastine nasal spray, loratadine, Qvar  Gastrointestinal: Zofran ODT, Aciphex, ranitidine   Topical: Lotrisone cream, ketoconazole shampoo, triamcinolone  Pain: meloxicam, Lyrica  Vitamins/Minerals: Vitamin D3, Vitamin B12  Infectious Diseases: azithromycin   Drug interactions:  . Hydroxyzine + azelastine nasal spray + Abilify + loratadine + Lyrica: CNS depressants may increase the CNS depressant effect of other CNS depressants. Patient counseled; patient denies any dizziness, sedation or falls.  . Abilify + bupropion XL: CYP2D6 Inhibitors, such as bupropion, may increase the serum concentration of  aripiprazole. However, aripiprazole dose reduction is not recommended when used as adjunctive therapy for major depressive disorder. . Carvedilol + bupropion XL: CYP2D6 Inhibitors, such as bupropion, may decrease the metabolism of CYP2D6 substrates, such as carvedilol. Both carvedilol and bupropion have been maintenance medications for this patient. Patient monitors her own blood pressure and is followed by a Cardiologist. Patient has been counseled to maintain her blood pressure log and to follow up closely with her Cardiologist. . Aspirin + meloxicam: Nonsteroidal Anti-Inflammatory agents, such as meloxicam, may enhance the adverse/toxic effect of aspirin. Note that patient is taking low-dose aspirin. Patient counseled. . Azithromycin + ondansetron: QTc-Prolonging agents may enhance the QTc-prolonging effect of other QTc-Prolonging agents. Patient counseled. Patient reports that she just had an EKG completed by her Cardiologist, Dr. Wyline Copas, in July.  Plan:  1) Penny Porter to continue to follow up closely with her Cardiologist regarding her blood pressure and episodes of tachycardia.  2) No further medication issues noted at this time. Will close pharmacy episode at this time.  Harlow Asa, PharmD, [facility] [phone]

## 2017-03-26 DIAGNOSIS — R079 Chest pain, unspecified: Secondary | ICD-10-CM | POA: Diagnosis not present

## 2017-04-02 DIAGNOSIS — M1712 Unilateral primary osteoarthritis, left knee: Secondary | ICD-10-CM | POA: Diagnosis not present

## 2017-04-07 DIAGNOSIS — F411 Generalized anxiety disorder: Secondary | ICD-10-CM | POA: Diagnosis not present

## 2017-04-07 DIAGNOSIS — F321 Major depressive disorder, single episode, moderate: Secondary | ICD-10-CM | POA: Diagnosis not present

## 2017-04-13 DIAGNOSIS — M25551 Pain in right hip: Secondary | ICD-10-CM | POA: Diagnosis not present

## 2017-04-14 DIAGNOSIS — R000 Tachycardia, unspecified: Secondary | ICD-10-CM | POA: Diagnosis not present

## 2017-04-14 DIAGNOSIS — R002 Palpitations: Secondary | ICD-10-CM | POA: Diagnosis not present

## 2017-04-14 DIAGNOSIS — E042 Nontoxic multinodular goiter: Secondary | ICD-10-CM | POA: Diagnosis not present

## 2017-04-14 DIAGNOSIS — I10 Essential (primary) hypertension: Secondary | ICD-10-CM | POA: Diagnosis not present

## 2017-04-15 DIAGNOSIS — S39012D Strain of muscle, fascia and tendon of lower back, subsequent encounter: Secondary | ICD-10-CM | POA: Diagnosis not present

## 2017-04-15 DIAGNOSIS — I10 Essential (primary) hypertension: Secondary | ICD-10-CM | POA: Diagnosis not present

## 2017-04-15 DIAGNOSIS — M545 Low back pain: Secondary | ICD-10-CM | POA: Diagnosis not present

## 2017-04-15 DIAGNOSIS — M5136 Other intervertebral disc degeneration, lumbar region: Secondary | ICD-10-CM | POA: Diagnosis not present

## 2017-04-15 DIAGNOSIS — M150 Primary generalized (osteo)arthritis: Secondary | ICD-10-CM | POA: Insufficient documentation

## 2017-04-15 DIAGNOSIS — S8991XA Unspecified injury of right lower leg, initial encounter: Secondary | ICD-10-CM | POA: Diagnosis not present

## 2017-04-21 DIAGNOSIS — M5416 Radiculopathy, lumbar region: Secondary | ICD-10-CM | POA: Diagnosis not present

## 2017-04-21 DIAGNOSIS — Z6837 Body mass index (BMI) 37.0-37.9, adult: Secondary | ICD-10-CM | POA: Diagnosis not present

## 2017-04-21 DIAGNOSIS — I10 Essential (primary) hypertension: Secondary | ICD-10-CM | POA: Diagnosis not present

## 2017-04-23 DIAGNOSIS — I10 Essential (primary) hypertension: Secondary | ICD-10-CM | POA: Diagnosis not present

## 2017-04-23 DIAGNOSIS — K209 Esophagitis, unspecified: Secondary | ICD-10-CM | POA: Diagnosis not present

## 2017-04-23 DIAGNOSIS — E782 Mixed hyperlipidemia: Secondary | ICD-10-CM | POA: Diagnosis not present

## 2017-04-23 DIAGNOSIS — G4733 Obstructive sleep apnea (adult) (pediatric): Secondary | ICD-10-CM | POA: Diagnosis not present

## 2017-04-23 DIAGNOSIS — J4540 Moderate persistent asthma, uncomplicated: Secondary | ICD-10-CM | POA: Diagnosis not present

## 2017-04-23 DIAGNOSIS — R5381 Other malaise: Secondary | ICD-10-CM | POA: Diagnosis not present

## 2017-04-23 DIAGNOSIS — R5383 Other fatigue: Secondary | ICD-10-CM | POA: Diagnosis not present

## 2017-04-23 DIAGNOSIS — Z79899 Other long term (current) drug therapy: Secondary | ICD-10-CM | POA: Diagnosis not present

## 2017-04-30 ENCOUNTER — Ambulatory Visit (HOSPITAL_COMMUNITY): Payer: Self-pay | Admitting: Psychiatry

## 2017-05-01 DIAGNOSIS — M545 Low back pain: Secondary | ICD-10-CM | POA: Diagnosis not present

## 2017-05-01 DIAGNOSIS — M5416 Radiculopathy, lumbar region: Secondary | ICD-10-CM | POA: Diagnosis not present

## 2017-05-04 DIAGNOSIS — Z6838 Body mass index (BMI) 38.0-38.9, adult: Secondary | ICD-10-CM | POA: Diagnosis not present

## 2017-05-04 DIAGNOSIS — M5126 Other intervertebral disc displacement, lumbar region: Secondary | ICD-10-CM | POA: Diagnosis not present

## 2017-05-04 DIAGNOSIS — I10 Essential (primary) hypertension: Secondary | ICD-10-CM | POA: Diagnosis not present

## 2017-05-08 ENCOUNTER — Other Ambulatory Visit: Payer: Self-pay | Admitting: Student

## 2017-05-08 DIAGNOSIS — M5126 Other intervertebral disc displacement, lumbar region: Secondary | ICD-10-CM

## 2017-05-14 ENCOUNTER — Ambulatory Visit (INDEPENDENT_AMBULATORY_CARE_PROVIDER_SITE_OTHER): Payer: PPO | Admitting: Psychiatry

## 2017-05-14 ENCOUNTER — Encounter (HOSPITAL_COMMUNITY): Payer: Self-pay | Admitting: Psychiatry

## 2017-05-14 VITALS — BP 138/74 | HR 124 | Ht 69.5 in | Wt 257.2 lb

## 2017-05-14 DIAGNOSIS — F332 Major depressive disorder, recurrent severe without psychotic features: Secondary | ICD-10-CM

## 2017-05-14 DIAGNOSIS — Z79899 Other long term (current) drug therapy: Secondary | ICD-10-CM

## 2017-05-14 DIAGNOSIS — F411 Generalized anxiety disorder: Secondary | ICD-10-CM | POA: Diagnosis not present

## 2017-05-14 DIAGNOSIS — Z810 Family history of intellectual disabilities: Secondary | ICD-10-CM | POA: Diagnosis not present

## 2017-05-14 DIAGNOSIS — F952 Tourette's disorder: Secondary | ICD-10-CM | POA: Diagnosis not present

## 2017-05-14 MED ORDER — HYDROXYZINE HCL 10 MG PO TABS: 10.0000 mg | ORAL_TABLET | Freq: Three times a day (TID) | ORAL | 0 refills | Status: DC | PRN

## 2017-05-14 MED ORDER — ARIPIPRAZOLE 20 MG PO TABS: 20.0000 mg | ORAL_TABLET | Freq: Every day | ORAL | 0 refills | Status: DC

## 2017-05-14 MED ORDER — BUPROPION HCL ER (XL) 300 MG PO TB24: ORAL_TABLET | ORAL | 0 refills | Status: DC

## 2017-05-14 NOTE — Addendum Note (Signed)
Addended by: Dennie Maizes E on: 05/14/2017 11:09 AM   Modules accepted: Orders

## 2017-05-14 NOTE — Progress Notes (Signed)
[facility] MD/PA/NP OP Progress Note  05/14/2017 10:37 AM Penny Porter  MRN:  [id number]  Chief Complaint:  Chief Complaint    Follow-up     HPI: Pt states about 3-4 weeks ago she was helping her mom and started having leg pain. It was discovered that she has a bulging disk in her back. She is going to need surgery but pt does not want it. She is scheduled for an injection tomorrow. Pt is not leaving her house much.  Pt's mother's health continues to decline. Pt is having signficant stress caring for her.  Pt's depression is present but she is so busy with her mother and dealing with pain that she hasn't thought about it much. Pt reports anhedonia. Pt is not isolating on purpose. Sleep is ok but some nights she wakes up multiple times a night randomly. Energy is limited. Pt denies SI/HI.  Pt has random tics in her arm and constant abdominal tics.   Anxiety is more situational now.  Pt states-taking meds as prescribed and denies SE.   Visit Diagnosis:    ICD-10-CM   1. Major depressive disorder, recurrent, severe without psychotic features ([place]) F33.2   2. Tourette's disease F95.2   3. Anxiety state F41.1     Past Psychiatric History:  Anxiety:No Bipolar Disorder:No Depression:Yes Mania:No Psychosis:No Schizophrenia:No Personality Disorder:No Hospitalization for psychiatric illness:No History of Electroconvulsive Shock Therapy:No Prior Suicide Attempts:No  Past Medical History:  Past Medical History:  Diagnosis Date  . Arthritis    Back and hip  . Asthma   . Bursitis   . Costochondral chest pain   . Degenerative disk disease   . Depression   . Gastroparesis   . GERD (gastroesophageal reflux disease)   . Hypertension   . Lumbar herniated disc   . Sleep apnea    not using CPAP  . Stenosis of lumbosacral spine   . Thyroid nodule   . Tourette disease   . Trigeminal nerve disease     Past Surgical History:  Procedure Laterality Date  . BACK SURGERY  September 19, 2015  . bone spur removal on back    . CHOLECYSTECTOMY  2005  . ELBOW SURGERY Left May 29, 2014  . KNEE SURGERY  06/2016  . polyp removal    . ruptured disk  2008  . TEAR DUCT PROBING  2009    Family Psychiatric History:   Family History  Problem Relation Age of Onset  . Mental retardation Sister   . Dementia Father   . Suicidality Neg Hx     Social History:  Social History   Social History  . Marital status: Single    Spouse name: N/A  . Number of children: N/A  . Years of education: N/A   Social History Main Topics  . Smoking status: Never Smoker  . Smokeless tobacco: Never Used  . Alcohol use No  . Drug use: No  . Sexual activity: Not Currently   Other Topics Concern  . None   Social History Narrative  . None    Allergies:  Allergies  Allergen Reactions  . Gadoversetamide Hives  . Avelox [Moxifloxacin Hcl In Nacl] Other (See Comments)    Caused pain in shoulders to finger, tendonitis  . Moxifloxacin Other (See Comments)    Caused pain in shoulders to finger, tendonitis  . Amoxicillin Hives  . Bextra [Valdecoxib] Hives and Swelling  . Brintellix [Vortioxetine] Nausea And Vomiting  . Doxycycline Hyclate Hives and Swelling  .  Fetzima [Levomilnacipran]     Tremors across head   . Gadobutrol Hives  . Gadolinium Derivatives Hives  . Iodinated Diagnostic Agents   . Lamictal [Lamotrigine] Hives  . Naproxen Hypertension  . Nitrofurantoin Other (See Comments)  . Percolone [Oxycodone] Hives  . Relafen [Nabumetone] Hypertension  . Risperdal [Risperidone]     Leg weakness     Metabolic Disorder Labs: No results found for: HGBA1C, MPG No results found for: PROLACTIN No results found for: CHOL, TRIG, HDL, CHOLHDL, VLDL, LDLCALC No results found for: TSH  Therapeutic Level Labs: No results found for: LITHIUM No results found for: VALPROATE No components found for:  CBMZ  Current Medications: Current Outpatient Prescriptions  Medication Sig  Dispense Refill  . albuterol (PROAIR HFA) 108 (90 Base) MCG/ACT inhaler Inhale two puffs every four to six hours as needed for cough or wheeze. 3 Inhaler 0  . albuterol (PROVENTIL) (2.5 MG/3ML) 0.083% nebulizer solution PLACE 1 VIAL IN NEBULIZER EVERY 6 HOURS AS NEEDED FOR WHEEZING UP TO 30 DAYS  5  . ARIPiprazole (ABILIFY) 20 MG tablet Take 1 tablet (20 mg total) by mouth daily. 30 tablet 3  . aspirin 81 MG tablet Take 81 mg by mouth daily.    Marland Kitchen azelastine (ASTELIN) 0.1 % nasal spray Place 2 sprays into both nostrils 2 (two) times daily. Use in each nostril as directed    . azithromycin (ZITHROMAX) 250 MG tablet TAKE ONE TABLET 3 TIMES WEEKLY 12 tablet 3  . BENICAR 40 MG tablet   4  . buPROPion (WELLBUTRIN XL) 300 MG 24 hr tablet TAKE 1 TABLET (300 MG TOTAL) BY MOUTH EVERY MORNING. 90 tablet 3  . carvedilol (COREG) 6.25 MG tablet Take 6.25 mg by mouth daily.    . Cholecalciferol (VITAMIN D-3) 5000 UNITS TABS Take 1 tablet by mouth daily.    . clotrimazole-betamethasone (LOTRISONE) cream APPLY SPARINGLY TO THE AFFECTED AREA(S) TWICE DAILY.  5  . diclofenac sodium (VOLTAREN) 1 % GEL Apply 1 g topically 2 (two) times daily.    Marland Kitchen dicyclomine (BENTYL) 10 MG capsule Take 10 mg by mouth 3 (three) times daily before meals.    . furosemide (LASIX) 20 MG tablet 20 mg. Take 1-2 tabs po up to twice a day as needed for edema    . hydrOXYzine (ATARAX/VISTARIL) 10 MG tablet Take 1 tablet (10 mg total) by mouth 3 (three) times daily as needed for anxiety. 90 tablet 3  . ketoconazole (NIZORAL) 2 % shampoo Apply 1 application topically 2 (two) times a week.    . loratadine (CLARITIN) 10 MG tablet Take 10 mg by mouth daily.    Marland Kitchen lubiprostone (AMITIZA) 24 MCG capsule Take 24 mcg by mouth 2 (two) times daily with a meal.    . meloxicam (MOBIC) 7.5 MG tablet Take 7.5 mg by mouth 2 (two) times daily.  0  . metroNIDAZOLE (METROCREAM) 0.75 % cream Apply topically 2 (two) times daily.    . mupirocin ointment (BACTROBAN)  2 % Place 1 application into the nose 2 (two) times daily.    . ondansetron (ZOFRAN-ODT) 4 MG disintegrating tablet Take 4 mg by mouth 2 (two) times daily as needed for nausea or vomiting.    . pregabalin (LYRICA) 75 MG capsule Take 75 mg by mouth 3 (three) times daily. Reported on 10/09/2015    . QVAR 80 MCG/ACT inhaler USE 1 PUFF DAILY TO PREVENT COUGH/WHEEZE-RINSE,GARGLE & SPIT AFTER USE-INCREASE TO 3 PUFFS W/FLAREUP 8.7 g 5  . RABEprazole (  ACIPHEX) 20 MG tablet Take 20 mg by mouth daily.    . ranitidine (ZANTAC) 300 MG tablet Take 300 mg by mouth at bedtime.    Marland Kitchen spironolactone (ALDACTONE) 25 MG tablet Take 25 mg by mouth daily.    . tizanidine (ZANAFLEX) 2 MG capsule Take 2 mg by mouth 2 (two) times daily.    . traMADol (ULTRAM) 50 MG tablet Take by mouth. 1-2 tabs po q6hr as needed for pain    . triamcinolone cream (KENALOG) 0.1 % Apply 1 application topically 2 (two) times daily.    Marland Kitchen triamcinolone ointment (KENALOG) 0.5 % Apply 1 application topically 2 (two) times daily.    . vitamin B-12 (CYANOCOBALAMIN) 1000 MCG tablet Take 1,000 mcg by mouth daily.     No current facility-administered medications for this visit.      Musculoskeletal: Strength & Muscle Tone: within normal limits Gait & Station: walking with cane due to back pain Patient leans: N/A  Psychiatric Specialty Exam: Review of Systems  Musculoskeletal: Positive for back pain. Negative for falls, joint pain and neck pain.       Leg and back pain  Neurological: Positive for sensory change. Negative for dizziness, tremors, speech change and headaches.  Psychiatric/Behavioral: Positive for depression. Negative for hallucinations, substance abuse and suicidal ideas. The patient is nervous/anxious. The patient does not have insomnia.     Blood pressure 138/74, pulse (!) 124, height 5' 9.5" (1.765 m), weight 257 lb 3.2 oz (116.7 kg).Body mass index is 37.44 kg/m.  General Appearance: Casual  Eye Contact:  Good  Speech:   Clear and Coherent and Normal Rate  Volume:  Normal  Mood:  Anxious and Depressed  Affect:  Congruent  Thought Process:  Goal Directed and Descriptions of Associations: Intact  Orientation:  Full (Time, Place, and Person)  Thought Content: WDL   Suicidal Thoughts:  No  Homicidal Thoughts:  No  Memory:  Immediate;   Good Recent;   Good Remote;   Good  Judgement:  Good  Insight:  Good  Psychomotor Activity:  Normal  Concentration:  Concentration: Good and Attention Span: Good  Recall:  Good  Fund of Knowledge: Good  Language: Good  Akathisia:  No  Handed:  Right  AIMS (if indicated): not done  Assets:  Communication Skills Desire for Improvement Housing Social Support Talents/Skills  ADL's:  Intact  Cognition: WNL  Sleep:  Fair   Screenings: PHQ2-9     Patient Outreach Telephone from 03/10/2017 in [place]  PHQ-2 Total Score  0       Assessment and Plan: MDD-recurrent, moderate; Tourettes; Anxiety disorder unspecified   Medication management with supportive therapy. Risks/benefits and SE of the medication discussed. Pt verbalized understanding and verbal consent obtained for treatment.  Affirm with the patient that the medications are taken as ordered. Patient expressed understanding of how their medications were to be used.   Meds: Abilify 20mg  po qD for mood disorder and Tourettes Wellbutrin XL 300mg  po qD for depression. Higher doses cause HTN Vistaril 10mg  po TID prn anxiety  Labs: ordered CBC, CMP, HbA1c, Lipid panel, TSH, Prolactin level, EKG   Therapy: brief supportive therapy provided. Discussed psychosocial stressors in detail.     Consultations: Encouraged to follow up with therapist Encouraged to follow up with PCP as needed  Pt denies SI and is at an acute low risk for suicide. Patient told to call clinic if any problems occur. Patient advised to go to ER if they  should develop SI/HI, side effects, or if symptoms worsen. Has crisis  numbers to call if needed. Pt verbalized understanding.  F/up in 3 months or sooner if needed    Charlcie Cradle, MD 05/14/2017, 10:37 AM

## 2017-05-26 DIAGNOSIS — Z23 Encounter for immunization: Secondary | ICD-10-CM | POA: Diagnosis not present

## 2017-06-01 DIAGNOSIS — F321 Major depressive disorder, single episode, moderate: Secondary | ICD-10-CM | POA: Diagnosis not present

## 2017-06-01 DIAGNOSIS — F411 Generalized anxiety disorder: Secondary | ICD-10-CM | POA: Diagnosis not present

## 2017-06-02 ENCOUNTER — Other Ambulatory Visit: Payer: Self-pay | Admitting: Neurosurgery

## 2017-06-02 ENCOUNTER — Other Ambulatory Visit: Payer: Self-pay | Admitting: *Deleted

## 2017-06-02 DIAGNOSIS — I878 Other specified disorders of veins: Secondary | ICD-10-CM | POA: Diagnosis not present

## 2017-06-02 DIAGNOSIS — R000 Tachycardia, unspecified: Secondary | ICD-10-CM | POA: Diagnosis not present

## 2017-06-02 DIAGNOSIS — I10 Essential (primary) hypertension: Secondary | ICD-10-CM | POA: Diagnosis not present

## 2017-06-02 DIAGNOSIS — R5381 Other malaise: Secondary | ICD-10-CM | POA: Diagnosis not present

## 2017-06-02 NOTE — Patient Outreach (Signed)
[facility]) Care Management  06/02/2017  Penny Porter Jan 15, 1966 [id number]  Telephone Screen  Referral Date: 06/02/17 Referral Source: Nurse Call Center Referral Reason: Hypotension Insurance: HTA  Incoming call from patient and HIPAA verified with patient. Patient confirmed calling the Nurse Call Center due to Hypotension. Patient provided blood readings ranging from 88/60 to 90//67. Patient stated, her blood pressure started decreasing the night before she contacted the Nurse Call Center. She stated, she drunk plenty of fluids and her blood pressure increased. She woke up about 2 hours later and her blood pressure dropped again. Patient described feeling symptoms of dizziness, dry mouth, and light headed. She stated, she ate something salty and drunk fluids to assist with increasing her blood pressure. Patient stated, she contacted her primary MD to determine make them aware of her blood pressure readings. She was looking for answers on how to treat her "low blood". Patient was given a same day appointment on 06/02/17. Per patient, the MD changed her Coreg to take a half of a 3.125 tablet. Patient was given instructions to contact the MD's office on Thursday to provided documented blood pressure readings. RN CM informed patient about safety due to patient being symptomatic. RN CM informed patient to sit/lay down and elevate her feet, which may improve her circulation. Patient reported, she has been stressed due to her mother passing away a few weeks ago. Per patient, she and the MD concluded her symptoms could be related to a stressful event and a decline in caring for herself, appropriately.   Plan:  RN CM will notify [facility] Case Management Assistant regarding case closure.  RN CM will send patient EMMI educational materials.    Lake Bells, RN, BSN, MHA/MSL, [place] Telephonic Care Manager Coordinator [facility] Direct Phone: [phone] Toll Free:  [phone] Fax: [phone]

## 2017-06-02 NOTE — Patient Outreach (Signed)
[facility]) Care Management  06/02/2017  Penny Porter 06/09/1966 [id number]  Telephone Screen  Referral Date: 06/02/17 Referral Source: Nurse Call Center Referral Reason: Hypotension Insurance: HTA  Outreach attempt #1 to patient. No answer. RN CM left HIPAA compliant message along with contact info.     Plan: RN CM will contact patient within one week.   Lake Bells, RN, BSN, MHA/MSL, [place] Telephonic Care Manager Coordinator [facility] Direct Phone: [phone] Toll Free: [phone] Fax: [phone]

## 2017-06-03 ENCOUNTER — Encounter: Payer: Self-pay | Admitting: *Deleted

## 2017-06-03 ENCOUNTER — Ambulatory Visit: Payer: Self-pay | Admitting: *Deleted

## 2017-06-05 ENCOUNTER — Telehealth (HOSPITAL_COMMUNITY): Payer: Self-pay

## 2017-06-05 NOTE — Telephone Encounter (Signed)
Patient is calling because her blood pressure is dropping in the mornings when she gets up. It has been as low as 90/60. Patient is on blood pressure medication that the doctor has decreased and she is wondering if Hydroxyzine can cause her blood pressure to drop. I spoke with Dr. Daron Offer and let him know she is on a 10 mg dose and she usually takes half of that. He stated that this should not cause her blood pressure to drop and that patient needs to contact PCP and let them know that it is still going that low. I called the patient back and told her what the doctor said, she is going to contact her PCP.

## 2017-06-09 DIAGNOSIS — Z79899 Other long term (current) drug therapy: Secondary | ICD-10-CM | POA: Diagnosis not present

## 2017-06-09 DIAGNOSIS — F332 Major depressive disorder, recurrent severe without psychotic features: Secondary | ICD-10-CM | POA: Diagnosis not present

## 2017-06-10 LAB — PROLACTIN: Prolactin: 13.1 ng/mL (ref 4.8–23.3)

## 2017-06-17 NOTE — Pre-Procedure Instructions (Signed)
Penny Porter  06/17/2017      [id number], [place] [address] [place] [address] Phone: [phone] Fax: [phone]  [facility] [place], [address] [place] [address] Phone: [phone] Fax: [phone]    Your procedure is scheduled on November 16  Report to [place] at [email or web address].M.  Call this number if you have problems the morning of surgery:  [phone]   Remember:  Do not eat food or drink liquids after midnight.  Continue all other medications as directed by your physician except follow these medication instructions before surgery   Take these medicines the morning of surgery with A SIP OF WATER  albuterol (PROAIR HFA) albuterol (PROVENTIL) ARIPiprazole (ABILIFY) buPROPion (WELLBUTRIN XL)  carvedilol (COREG)  hydrOXYzine (ATARAX/VISTARIL)  ondansetron (ZOFRAN-ODT) pregabalin (LYRICA)  QVAR RABEprazole (ACIPHEX)  tizanidine (ZANAFLEX) traMADol (ULTRAM)  7 days prior to surgery STOP taking any Aspirin (unless otherwise instructed by your surgeon), Aleve, Naproxen, Ibuprofen, Motrin, Advil, Goody's, BC's, all herbal medications, fish oil, and all vitamins meloxicam (MOBIC)    Do not wear jewelry, make-up or nail polish.  Do not wear lotions, powders, or perfumes, or deoderant.  Do not shave 48 hours prior to surgery.    Do not bring valuables to the hospital.  [facility] is not responsible for any belongings or valuables.  Contacts, dentures or bridgework may not be worn into surgery.  Leave your suitcase in the car.  After surgery it may be brought to your room.  For patients admitted to the hospital, discharge time will be determined by your treatment team.  Patients discharged the day of surgery will not be allowed to drive home.   Special instructions:   [place]- Preparing For Surgery  Before surgery, you can  play an important role. Because skin is not sterile, your skin needs to be as free of germs as possible. You can reduce the number of germs on your skin by washing with CHG (chlorahexidine gluconate) Soap before surgery.  CHG is an antiseptic cleaner which kills germs and bonds with the skin to continue killing germs even after washing.  Please do not use if you have an allergy to CHG or antibacterial soaps. If your skin becomes reddened/irritated stop using the CHG.  Do not shave (including legs and underarms) for at least 48 hours prior to first CHG shower. It is OK to shave your face.  Please follow these instructions carefully.   1. Shower the NIGHT BEFORE SURGERY and the MORNING OF SURGERY with CHG.   2. If you chose to wash your hair, wash your hair first as usual with your normal shampoo.  3. After you shampoo, rinse your hair and body thoroughly to remove the shampoo.  4. Use CHG as you would any other liquid soap. You can apply CHG directly to the skin and wash gently with a scrungie or a clean washcloth.   5. Apply the CHG Soap to your body ONLY FROM THE NECK DOWN.  Do not use on open wounds or open sores. Avoid contact with your eyes, ears, mouth and genitals (private parts). Wash Face and genitals (private parts)  with your normal soap.  6. Wash thoroughly, paying special attention to the area where your surgery will be performed.  7. Thoroughly rinse your body with warm water from the neck down.  8.  DO NOT shower/wash with your normal soap after using and rinsing off the CHG Soap.  9. Pat yourself dry with a CLEAN TOWEL.  10. Wear CLEAN PAJAMAS to bed the night before surgery, wear comfortable clothes the morning of surgery  11. Place CLEAN SHEETS on your bed the night of your first shower and DO NOT SLEEP WITH PETS.    Day of Surgery: Do not apply any deodorants/lotions. Please wear clean clothes to the hospital/surgery center.      Please read over the following  fact sheets that you were given.

## 2017-06-18 ENCOUNTER — Other Ambulatory Visit: Payer: Self-pay

## 2017-06-18 ENCOUNTER — Encounter (HOSPITAL_COMMUNITY)
Admission: RE | Admit: 2017-06-18 | Discharge: 2017-06-18 | Disposition: A | Discharge status: Home / Self Care | Payer: PPO | Source: Ambulatory Visit | Attending: Neurosurgery | Admitting: Neurosurgery

## 2017-06-18 ENCOUNTER — Encounter (HOSPITAL_COMMUNITY): Payer: Self-pay | Admitting: *Deleted

## 2017-06-18 DIAGNOSIS — Z01818 Encounter for other preprocedural examination: Secondary | ICD-10-CM | POA: Diagnosis not present

## 2017-06-18 DIAGNOSIS — M5126 Other intervertebral disc displacement, lumbar region: Secondary | ICD-10-CM | POA: Insufficient documentation

## 2017-06-18 HISTORY — DX: Adverse effect of unspecified anesthetic, initial encounter: T41.45XA

## 2017-06-18 HISTORY — DX: Other complications of anesthesia, initial encounter: T88.59XA

## 2017-06-18 HISTORY — DX: Anxiety disorder, unspecified: F41.9

## 2017-06-18 LAB — CBC
HCT: 43.2 % (ref 36.0–46.0)
Hemoglobin: 14.6 g/dL (ref 12.0–15.0)
MCH: 31.9 pg (ref 26.0–34.0)
MCHC: 33.8 g/dL (ref 30.0–36.0)
MCV: 94.3 fL (ref 78.0–100.0)
PLATELETS: 212 10*3/uL (ref 150–400)
RBC: 4.58 MIL/uL (ref 3.87–5.11)
RDW: 13.8 % (ref 11.5–15.5)
WBC: 9 10*3/uL (ref 4.0–10.5)

## 2017-06-18 LAB — BASIC METABOLIC PANEL
Anion gap: 5 (ref 5–15)
BUN: 14 mg/dL (ref 6–20)
CALCIUM: 9 mg/dL (ref 8.9–10.3)
CO2: 27 mmol/L (ref 22–32)
CREATININE: 0.97 mg/dL (ref 0.44–1.00)
Chloride: 106 mmol/L (ref 101–111)
Glucose, Bld: 76 mg/dL (ref 65–99)
Potassium: 3.9 mmol/L (ref 3.5–5.1)
SODIUM: 138 mmol/L (ref 135–145)

## 2017-06-18 LAB — HCG, SERUM, QUALITATIVE: PREG SERUM: NEGATIVE

## 2017-06-18 LAB — SURGICAL PCR SCREEN
MRSA, PCR: NEGATIVE
STAPHYLOCOCCUS AUREUS: NEGATIVE

## 2017-06-18 LAB — TYPE AND SCREEN
ABO/RH(D): O POS
ANTIBODY SCREEN: NEGATIVE

## 2017-06-18 LAB — ABO/RH: ABO/RH(D): O POS

## 2017-06-18 NOTE — Progress Notes (Signed)
PCP - Gilford Rile Cardiologist - Abran Richard - [place]  Chest x-ray - not needed EKG - 03/26/17 requesting Stress Test - requesting ECHO - requesing Cardiac Cath - denies  Sleep Study - requesting CPAP - does not use  Sending to anesthesia for review of records    Patient denies shortness of breath, fever, cough and chest pain at PAT appointment   Patient verbalized understanding of instructions that were given to them at the PAT appointment. Patient was also instructed that they will need to review over the PAT instructions again at home before surgery.

## 2017-06-19 NOTE — Progress Notes (Addendum)
Anesthesia Chart Review: Patient is a 51 year old female scheduled for L4-5 PLIF on 06/26/17 by Dr. Kary Kos.  History includes never smoker, asthma, depression, GERD, HTN, Tourette disease, trigeminal nerve disease, gastroparesis, OSA (no CPAP), thyroid nodule, cholecystectomy '05, back surgery '17, costochondral chest pain (non-ischemic stress test 03/2017), post-operative urinary retention (did not require I&O cath). BMI is consistent with obesity.   - PCP is Dr. Gilford Rile with [facility] in [facility]). - Cardiologist is Dr. Abran Richard. Last visit [facility] on 04/14/17 for management of tachycardia. Stress test was negative. No arrhythmia seen on event monitor. Six month follow-up recommended. - She was evaluated at [facility] on 01/22/17 for follow-up testing for BLE pain. 01/01/17 ABIs were within normal limits, no elevated velocities BLE. 01/01/17 carotid duplex was "negative." PRN vascular surgery follow-up recommended.   Meds include pro-air HFA, albuterol nebulizer, Abilify, aspirin 81 mg (holding 7 days prior to surgery) azithromycin 3 times weekly (MWF) for rosacea, Benicar, Wellbutrin XL, Coreg, Lasix, hydroxyzine, loratadine, Amitiza PRN, Zofran ODT, Lyrica, Qvar, AcipHex, Zantac, Aldactone, Zanaflex, tramadol.  BP (!) 152/97   Pulse 96   Temp 36.6 C   Resp 20   Ht 5' 9.5" (1.765 m)   Wt 256 lb 12.8 oz (116.5 kg)   LMP 04/15/2017   SpO2 100%   BMI 37.38 kg/m   EKG 03/26/17 [facility]): ST at 102 bpm. (Tracing received only shows one beat of V4-6, but multiple other tracings received as part of 03/31/17 stress test.)   Nuclear stress test 03/31/17 ([place]): Impressions: Uneventful Lexiscan injection. No significant ischemia detected. Reverse redistribution of inferior wall and apex of unclear significance. No evidence of infarct. Clinically negative stress test with no chest pain. Normal left ventricular systolic  function. No stress-induced arrhythmias.  Echo 07/02/16 ([place]): Conclusion: 1. Left ventricle cavity is normal in size. 2. Normal left ventricular systolic function. 3. Visual EF is 50-55%. 4. Mild aortic valve leaflet thickening with mild calcification, no stenosis noted.  5. Structurally normal mitral valve with trace regurgitation.  Event monitor 09/11/16-10/10/16 ([facility]): CONCLUSIONS: 1.Normal event monitor.  2.No arrhythmia as described 3.Symptoms were not reported  4.Symptoms were not correlated with arrhythmias.  Preoperative labs noted. CBC, BMET WNL. Glucose 76. Serum pregnancy test negative. T&S done.   She will get vitals on arrival to re-evaluate her BP. Notes in Care Everywhere indicate patient is monitoring her BP at home, and has known DBP readings in the 90's. As of 06/15/17 Mayer Camel, NP did not recommend any further medication adjustments. If BP acceptable and otherwise no acute changes then I would anticipate that she can proceed as planned.  [facility] Short Stay Center/Anesthesiology Phone [phone] 06/19/2017 5:05 PM

## 2017-06-25 MED ORDER — VANCOMYCIN HCL 10 G IV SOLR
1500.0000 mg | INTRAVENOUS | Status: AC
  Administered 2017-06-26: 1500 mg via INTRAVENOUS
  Filled 2017-06-25: qty 1500

## 2017-06-25 MED ORDER — DEXAMETHASONE SODIUM PHOSPHATE 10 MG/ML IJ SOLN
10.0000 mg | INTRAMUSCULAR | Status: AC
  Administered 2017-06-26: 10 mg via INTRAVENOUS

## 2017-06-26 ENCOUNTER — Inpatient Hospital Stay (HOSPITAL_COMMUNITY): Payer: PPO | Admitting: Anesthesiology

## 2017-06-26 ENCOUNTER — Inpatient Hospital Stay (HOSPITAL_COMMUNITY): Payer: PPO | Admitting: Vascular Surgery

## 2017-06-26 ENCOUNTER — Inpatient Hospital Stay (HOSPITAL_COMMUNITY)
Admission: RE | Admit: 2017-06-26 | Discharge: 2017-06-27 | DRG: 460 | Disposition: A | Discharge status: Home / Self Care | Payer: PPO | Source: Ambulatory Visit | Attending: Neurosurgery | Admitting: Neurosurgery

## 2017-06-26 ENCOUNTER — Inpatient Hospital Stay (HOSPITAL_COMMUNITY): Payer: PPO

## 2017-06-26 ENCOUNTER — Encounter (HOSPITAL_COMMUNITY)
Admission: RE | Disposition: A | Discharge status: Home / Self Care | Payer: Self-pay | Source: Ambulatory Visit | Attending: Neurosurgery

## 2017-06-26 ENCOUNTER — Encounter (HOSPITAL_COMMUNITY): Payer: Self-pay

## 2017-06-26 DIAGNOSIS — Z888 Allergy status to other drugs, medicaments and biological substances status: Secondary | ICD-10-CM | POA: Diagnosis not present

## 2017-06-26 DIAGNOSIS — Z91048 Other nonmedicinal substance allergy status: Secondary | ICD-10-CM | POA: Diagnosis not present

## 2017-06-26 DIAGNOSIS — Z881 Allergy status to other antibiotic agents status: Secondary | ICD-10-CM

## 2017-06-26 DIAGNOSIS — M4726 Other spondylosis with radiculopathy, lumbar region: Secondary | ICD-10-CM | POA: Diagnosis not present

## 2017-06-26 DIAGNOSIS — I10 Essential (primary) hypertension: Secondary | ICD-10-CM | POA: Diagnosis not present

## 2017-06-26 DIAGNOSIS — M5416 Radiculopathy, lumbar region: Secondary | ICD-10-CM | POA: Diagnosis present

## 2017-06-26 DIAGNOSIS — Z9889 Other specified postprocedural states: Secondary | ICD-10-CM | POA: Diagnosis not present

## 2017-06-26 DIAGNOSIS — G4730 Sleep apnea, unspecified: Secondary | ICD-10-CM | POA: Diagnosis not present

## 2017-06-26 DIAGNOSIS — Z886 Allergy status to analgesic agent status: Secondary | ICD-10-CM

## 2017-06-26 DIAGNOSIS — M48061 Spinal stenosis, lumbar region without neurogenic claudication: Secondary | ICD-10-CM | POA: Diagnosis not present

## 2017-06-26 DIAGNOSIS — J45909 Unspecified asthma, uncomplicated: Secondary | ICD-10-CM | POA: Diagnosis present

## 2017-06-26 DIAGNOSIS — F329 Major depressive disorder, single episode, unspecified: Secondary | ICD-10-CM | POA: Diagnosis present

## 2017-06-26 DIAGNOSIS — Z91041 Radiographic dye allergy status: Secondary | ICD-10-CM

## 2017-06-26 DIAGNOSIS — M5126 Other intervertebral disc displacement, lumbar region: Secondary | ICD-10-CM | POA: Diagnosis not present

## 2017-06-26 DIAGNOSIS — Z791 Long term (current) use of non-steroidal anti-inflammatories (NSAID): Secondary | ICD-10-CM

## 2017-06-26 DIAGNOSIS — M545 Low back pain: Secondary | ICD-10-CM | POA: Diagnosis present

## 2017-06-26 DIAGNOSIS — Z7951 Long term (current) use of inhaled steroids: Secondary | ICD-10-CM | POA: Diagnosis not present

## 2017-06-26 DIAGNOSIS — Z7982 Long term (current) use of aspirin: Secondary | ICD-10-CM

## 2017-06-26 DIAGNOSIS — M5116 Intervertebral disc disorders with radiculopathy, lumbar region: Secondary | ICD-10-CM | POA: Diagnosis not present

## 2017-06-26 DIAGNOSIS — Z419 Encounter for procedure for purposes other than remedying health state, unspecified: Secondary | ICD-10-CM

## 2017-06-26 DIAGNOSIS — M4326 Fusion of spine, lumbar region: Secondary | ICD-10-CM | POA: Diagnosis not present

## 2017-06-26 DIAGNOSIS — F418 Other specified anxiety disorders: Secondary | ICD-10-CM | POA: Diagnosis not present

## 2017-06-26 DIAGNOSIS — F419 Anxiety disorder, unspecified: Secondary | ICD-10-CM | POA: Diagnosis not present

## 2017-06-26 DIAGNOSIS — K219 Gastro-esophageal reflux disease without esophagitis: Secondary | ICD-10-CM | POA: Diagnosis present

## 2017-06-26 HISTORY — PX: POSTERIOR FUSION LUMBAR SPINE: SUR632

## 2017-06-26 SURGERY — POSTERIOR LUMBAR FUSION 1 LEVEL
Anesthesia: General | Site: Spine Lumbar

## 2017-06-26 MED ORDER — ONDANSETRON 4 MG PO TBDP: 4.0000 mg | ORAL_TABLET | Freq: Two times a day (BID) | ORAL | Status: DC | PRN

## 2017-06-26 MED ORDER — BUPIVACAINE LIPOSOME 1.3 % IJ SUSP
20.0000 mL | INTRAMUSCULAR | Status: AC
  Administered 2017-06-26: 20 mL
  Filled 2017-06-26: qty 20

## 2017-06-26 MED ORDER — ACETAMINOPHEN 500 MG PO TABS: 500.0000 mg | ORAL_TABLET | Freq: Four times a day (QID) | ORAL | Status: DC | PRN

## 2017-06-26 MED ORDER — IRBESARTAN 75 MG PO TABS
37.5000 mg | ORAL_TABLET | Freq: Every day | ORAL | Status: DC
  Administered 2017-06-27: 37.5 mg via ORAL
  Filled 2017-06-26: qty 0.5

## 2017-06-26 MED ORDER — MIDAZOLAM HCL 2 MG/2ML IJ SOLN
INTRAMUSCULAR | Status: AC
  Filled 2017-06-26: qty 2

## 2017-06-26 MED ORDER — ACETAMINOPHEN 650 MG RE SUPP: 650.0000 mg | RECTAL | Status: DC | PRN

## 2017-06-26 MED ORDER — HYDROMORPHONE HCL 1 MG/ML IJ SOLN: 0.5000 mg | INTRAMUSCULAR | Status: DC | PRN

## 2017-06-26 MED ORDER — BUPROPION HCL ER (XL) 300 MG PO TB24
300.0000 mg | ORAL_TABLET | Freq: Every day | ORAL | Status: DC
  Administered 2017-06-27: 300 mg via ORAL
  Filled 2017-06-26: qty 1

## 2017-06-26 MED ORDER — VITAMIN B-12 1000 MCG PO TABS
1000.0000 ug | ORAL_TABLET | Freq: Every day | ORAL | Status: DC
  Administered 2017-06-27: 1000 ug via ORAL
  Filled 2017-06-26: qty 1

## 2017-06-26 MED ORDER — BUDESONIDE 0.25 MG/2ML IN SUSP
0.2500 mg | Freq: Two times a day (BID) | RESPIRATORY_TRACT | Status: DC
  Filled 2017-06-26 (×2): qty 2

## 2017-06-26 MED ORDER — FENTANYL CITRATE (PF) 250 MCG/5ML IJ SOLN
INTRAMUSCULAR | Status: DC | PRN
  Administered 2017-06-26: 100 ug via INTRAVENOUS
  Administered 2017-06-26: 50 ug via INTRAVENOUS
  Administered 2017-06-26: 100 ug via INTRAVENOUS
  Administered 2017-06-26: 50 ug via INTRAVENOUS

## 2017-06-26 MED ORDER — SODIUM CHLORIDE 0.9 % IR SOLN
Status: DC | PRN
  Administered 2017-06-26: 500 mL

## 2017-06-26 MED ORDER — MENTHOL 3 MG MT LOZG: 1.0000 | LOZENGE | OROMUCOSAL | Status: DC | PRN

## 2017-06-26 MED ORDER — LIDOCAINE-EPINEPHRINE 1 %-1:100000 IJ SOLN
INTRAMUSCULAR | Status: AC
  Filled 2017-06-26: qty 1

## 2017-06-26 MED ORDER — KETOCONAZOLE 2 % EX SHAM: 1.0000 "application " | MEDICATED_SHAMPOO | CUTANEOUS | Status: DC | PRN

## 2017-06-26 MED ORDER — HYDROXYZINE HCL 10 MG PO TABS
10.0000 mg | ORAL_TABLET | Freq: Two times a day (BID) | ORAL | Status: DC
  Administered 2017-06-26 – 2017-06-27 (×2): 10 mg via ORAL
  Filled 2017-06-26 (×2): qty 1

## 2017-06-26 MED ORDER — DIPHENHYDRAMINE HCL 50 MG/ML IJ SOLN
INTRAMUSCULAR | Status: DC | PRN
  Administered 2017-06-26: 25 mg via INTRAVENOUS

## 2017-06-26 MED ORDER — PHENYLEPHRINE HCL 10 MG/ML IJ SOLN
INTRAMUSCULAR | Status: DC | PRN
  Administered 2017-06-26: 60 ug via INTRAVENOUS
  Administered 2017-06-26: 40 ug via INTRAVENOUS

## 2017-06-26 MED ORDER — PHENOL 1.4 % MT LIQD: 1.0000 | OROMUCOSAL | Status: DC | PRN

## 2017-06-26 MED ORDER — PROPOFOL 10 MG/ML IV BOLUS
INTRAVENOUS | Status: AC
  Filled 2017-06-26: qty 20

## 2017-06-26 MED ORDER — ONDANSETRON HCL 4 MG/2ML IJ SOLN
INTRAMUSCULAR | Status: DC | PRN
  Administered 2017-06-26: 4 mg via INTRAVENOUS

## 2017-06-26 MED ORDER — TIZANIDINE HCL 2 MG PO TABS
2.0000 mg | ORAL_TABLET | Freq: Every day | ORAL | Status: DC | PRN
  Administered 2017-06-27: 2 mg via ORAL
  Filled 2017-06-26 (×2): qty 1

## 2017-06-26 MED ORDER — LACTATED RINGERS IV SOLN
INTRAVENOUS | Status: DC | PRN
  Administered 2017-06-26 (×3): via INTRAVENOUS

## 2017-06-26 MED ORDER — MIDAZOLAM HCL 5 MG/5ML IJ SOLN
INTRAMUSCULAR | Status: DC | PRN
  Administered 2017-06-26: 2 mg via INTRAVENOUS

## 2017-06-26 MED ORDER — MELOXICAM 7.5 MG PO TABS
7.5000 mg | ORAL_TABLET | Freq: Two times a day (BID) | ORAL | Status: DC
  Administered 2017-06-26 – 2017-06-27 (×2): 7.5 mg via ORAL
  Filled 2017-06-26 (×2): qty 1

## 2017-06-26 MED ORDER — SPIRONOLACTONE 25 MG PO TABS
25.0000 mg | ORAL_TABLET | Freq: Every day | ORAL | Status: DC
  Administered 2017-06-27: 25 mg via ORAL
  Filled 2017-06-26: qty 1

## 2017-06-26 MED ORDER — CARVEDILOL 3.125 MG PO TABS
1.5625 mg | ORAL_TABLET | Freq: Two times a day (BID) | ORAL | Status: DC
  Administered 2017-06-26 – 2017-06-27 (×2): 1.5625 mg via ORAL
  Filled 2017-06-26 (×2): qty 1

## 2017-06-26 MED ORDER — PREGABALIN 75 MG PO CAPS
75.0000 mg | ORAL_CAPSULE | Freq: Two times a day (BID) | ORAL | Status: DC
  Administered 2017-06-26 – 2017-06-27 (×2): 75 mg via ORAL
  Filled 2017-06-26 (×2): qty 1

## 2017-06-26 MED ORDER — LIDOCAINE HCL (CARDIAC) 20 MG/ML IV SOLN
INTRAVENOUS | Status: DC | PRN
  Administered 2017-06-26: 100 mg via INTRATRACHEAL

## 2017-06-26 MED ORDER — HYDROMORPHONE HCL 1 MG/ML IJ SOLN
0.2500 mg | INTRAMUSCULAR | Status: DC | PRN
  Administered 2017-06-26 (×4): 0.5 mg via INTRAVENOUS

## 2017-06-26 MED ORDER — SODIUM CHLORIDE 0.9% FLUSH
3.0000 mL | Freq: Two times a day (BID) | INTRAVENOUS | Status: DC
  Administered 2017-06-26: 3 mL via INTRAVENOUS

## 2017-06-26 MED ORDER — ALBUTEROL SULFATE HFA 108 (90 BASE) MCG/ACT IN AERS: 2.0000 | INHALATION_SPRAY | RESPIRATORY_TRACT | Status: DC | PRN

## 2017-06-26 MED ORDER — VANCOMYCIN HCL 1000 MG IV SOLR
INTRAVENOUS | Status: AC
  Filled 2017-06-26: qty 1000

## 2017-06-26 MED ORDER — PANTOPRAZOLE SODIUM 40 MG IV SOLR: 40.0000 mg | Freq: Every day | INTRAVENOUS | Status: DC

## 2017-06-26 MED ORDER — MUPIROCIN 2 % EX OINT: 1.0000 "application " | TOPICAL_OINTMENT | Freq: Two times a day (BID) | CUTANEOUS | Status: DC | PRN

## 2017-06-26 MED ORDER — MEPERIDINE HCL 25 MG/ML IJ SOLN: 6.2500 mg | INTRAMUSCULAR | Status: DC | PRN

## 2017-06-26 MED ORDER — HYDROMORPHONE HCL 1 MG/ML IJ SOLN
INTRAMUSCULAR | Status: AC
  Filled 2017-06-26: qty 1

## 2017-06-26 MED ORDER — FENTANYL CITRATE (PF) 250 MCG/5ML IJ SOLN
INTRAMUSCULAR | Status: AC
  Filled 2017-06-26: qty 5

## 2017-06-26 MED ORDER — ALBUTEROL SULFATE (2.5 MG/3ML) 0.083% IN NEBU: 2.5000 mg | INHALATION_SOLUTION | RESPIRATORY_TRACT | Status: DC | PRN

## 2017-06-26 MED ORDER — FAMOTIDINE 20 MG PO TABS
40.0000 mg | ORAL_TABLET | Freq: Two times a day (BID) | ORAL | Status: DC
  Administered 2017-06-26 – 2017-06-27 (×2): 40 mg via ORAL
  Filled 2017-06-26 (×2): qty 2

## 2017-06-26 MED ORDER — ONDANSETRON HCL 4 MG PO TABS: 4.0000 mg | ORAL_TABLET | Freq: Four times a day (QID) | ORAL | Status: DC | PRN

## 2017-06-26 MED ORDER — LUBIPROSTONE 24 MCG PO CAPS
24.0000 ug | ORAL_CAPSULE | Freq: Every day | ORAL | Status: DC | PRN
Start: 2017-06-26 — End: 2017-06-27
  Filled 2017-06-26: qty 1

## 2017-06-26 MED ORDER — CYCLOBENZAPRINE HCL 10 MG PO TABS
ORAL_TABLET | ORAL | Status: AC
  Filled 2017-06-26: qty 1

## 2017-06-26 MED ORDER — ROCURONIUM BROMIDE 100 MG/10ML IV SOLN
INTRAVENOUS | Status: DC | PRN
  Administered 2017-06-26: 30 mg via INTRAVENOUS
  Administered 2017-06-26: 20 mg via INTRAVENOUS
  Administered 2017-06-26: 10 mg via INTRAVENOUS
  Administered 2017-06-26: 70 mg via INTRAVENOUS

## 2017-06-26 MED ORDER — THROMBIN (RECOMBINANT) 20000 UNITS EX SOLR
CUTANEOUS | Status: AC
  Filled 2017-06-26: qty 20000

## 2017-06-26 MED ORDER — SUGAMMADEX SODIUM 500 MG/5ML IV SOLN
INTRAVENOUS | Status: DC | PRN
  Administered 2017-06-26: 300 mg via INTRAVENOUS

## 2017-06-26 MED ORDER — PANTOPRAZOLE SODIUM 40 MG PO TBEC
40.0000 mg | DELAYED_RELEASE_TABLET | Freq: Every day | ORAL | Status: DC
  Administered 2017-06-27: 40 mg via ORAL
  Filled 2017-06-26: qty 1

## 2017-06-26 MED ORDER — HEMOSTATIC AGENTS (NO CHARGE) OPTIME
TOPICAL | Status: DC | PRN
  Administered 2017-06-26: 1 via TOPICAL

## 2017-06-26 MED ORDER — ALBUMIN HUMAN 5 % IV SOLN
INTRAVENOUS | Status: DC | PRN
  Administered 2017-06-26: 10:00:00 via INTRAVENOUS

## 2017-06-26 MED ORDER — 0.9 % SODIUM CHLORIDE (POUR BTL) OPTIME
TOPICAL | Status: DC | PRN
  Administered 2017-06-26: 1000 mL

## 2017-06-26 MED ORDER — LORATADINE 10 MG PO TABS
10.0000 mg | ORAL_TABLET | Freq: Every day | ORAL | Status: DC
  Administered 2017-06-26: 10 mg via ORAL
  Filled 2017-06-26: qty 1

## 2017-06-26 MED ORDER — ARIPIPRAZOLE 10 MG PO TABS
20.0000 mg | ORAL_TABLET | Freq: Every day | ORAL | Status: DC
  Filled 2017-06-26: qty 2

## 2017-06-26 MED ORDER — VANCOMYCIN HCL IN DEXTROSE 1-5 GM/200ML-% IV SOLN
1000.0000 mg | Freq: Two times a day (BID) | INTRAVENOUS | Status: DC
  Administered 2017-06-26 – 2017-06-27 (×2): 1000 mg via INTRAVENOUS
  Filled 2017-06-26 (×2): qty 200

## 2017-06-26 MED ORDER — THROMBIN (RECOMBINANT) 20000 UNITS EX SOLR
CUTANEOUS | Status: DC | PRN
  Administered 2017-06-26: 20000 [IU] via TOPICAL

## 2017-06-26 MED ORDER — OXYCODONE HCL 5 MG PO TABS
ORAL_TABLET | ORAL | Status: AC
  Filled 2017-06-26: qty 2

## 2017-06-26 MED ORDER — ALUM & MAG HYDROXIDE-SIMETH 200-200-20 MG/5ML PO SUSP: 30.0000 mL | Freq: Four times a day (QID) | ORAL | Status: DC | PRN

## 2017-06-26 MED ORDER — CYCLOBENZAPRINE HCL 10 MG PO TABS
10.0000 mg | ORAL_TABLET | Freq: Three times a day (TID) | ORAL | Status: DC | PRN
  Administered 2017-06-26 – 2017-06-27 (×3): 10 mg via ORAL
  Filled 2017-06-26 (×2): qty 1

## 2017-06-26 MED ORDER — LIDOCAINE-EPINEPHRINE 1 %-1:100000 IJ SOLN
INTRAMUSCULAR | Status: DC | PRN
  Administered 2017-06-26: 10 mL

## 2017-06-26 MED ORDER — TRAMADOL HCL 50 MG PO TABS: 50.0000 mg | ORAL_TABLET | Freq: Four times a day (QID) | ORAL | Status: DC | PRN

## 2017-06-26 MED ORDER — FUROSEMIDE 20 MG PO TABS: 20.0000 mg | ORAL_TABLET | Freq: Every day | ORAL | Status: DC | PRN

## 2017-06-26 MED ORDER — CHLORHEXIDINE GLUCONATE CLOTH 2 % EX PADS: 6.0000 | MEDICATED_PAD | Freq: Once | CUTANEOUS | Status: DC

## 2017-06-26 MED ORDER — ASPIRIN EC 81 MG PO TBEC
81.0000 mg | DELAYED_RELEASE_TABLET | Freq: Every day | ORAL | Status: DC
  Administered 2017-06-27: 81 mg via ORAL
  Filled 2017-06-26: qty 1

## 2017-06-26 MED ORDER — VITAMIN D 1000 UNITS PO TABS: 5000.0000 [IU] | ORAL_TABLET | Freq: Every day | ORAL | Status: DC

## 2017-06-26 MED ORDER — SODIUM CHLORIDE 0.9% FLUSH: 3.0000 mL | INTRAVENOUS | Status: DC | PRN

## 2017-06-26 MED ORDER — VANCOMYCIN HCL 1000 MG IV SOLR
INTRAVENOUS | Status: DC | PRN
  Administered 2017-06-26: 1000 mg via TOPICAL

## 2017-06-26 MED ORDER — PROMETHAZINE HCL 25 MG/ML IJ SOLN: 6.2500 mg | INTRAMUSCULAR | Status: DC | PRN

## 2017-06-26 MED ORDER — ONDANSETRON HCL 4 MG/2ML IJ SOLN: 4.0000 mg | Freq: Four times a day (QID) | INTRAMUSCULAR | Status: DC | PRN

## 2017-06-26 MED ORDER — PROPOFOL 10 MG/ML IV BOLUS
INTRAVENOUS | Status: DC | PRN
  Administered 2017-06-26: 150 mg via INTRAVENOUS

## 2017-06-26 MED ORDER — ACETAMINOPHEN 325 MG PO TABS
650.0000 mg | ORAL_TABLET | ORAL | Status: DC | PRN
  Administered 2017-06-26: 650 mg via ORAL
  Filled 2017-06-26: qty 2

## 2017-06-26 MED ORDER — OXYCODONE HCL 5 MG PO TABS
10.0000 mg | ORAL_TABLET | ORAL | Status: DC | PRN
  Administered 2017-06-26 – 2017-06-27 (×5): 10 mg via ORAL
  Filled 2017-06-26 (×4): qty 2

## 2017-06-26 MED ORDER — ARIPIPRAZOLE 10 MG PO TABS
20.0000 mg | ORAL_TABLET | Freq: Every day | ORAL | Status: DC
  Administered 2017-06-27: 20 mg via ORAL
  Filled 2017-06-26: qty 2

## 2017-06-26 SURGICAL SUPPLY — 82 items
BAG DECANTER FOR FLEXI CONT (MISCELLANEOUS) ×3 IMPLANT
BENZOIN TINCTURE PRP APPL 2/3 (GAUZE/BANDAGES/DRESSINGS) ×3 IMPLANT
BIT DRILL 5.0/4.0 (BIT) ×1 IMPLANT
BLADE CLIPPER SURG (BLADE) ×3 IMPLANT
BLADE SURG 11 STRL SS (BLADE) ×3 IMPLANT
BONE VIVIGEN FORMABLE 5.4CC (Bone Implant) ×3 IMPLANT
BUR CUTTER 7.0 ROUND (BURR) ×3 IMPLANT
BUR MATCHSTICK NEURO 3.0 LAGG (BURR) ×3 IMPLANT
CAGE RISE 11-17-15 10X26 (Cage) ×6 IMPLANT
CANISTER SUCT 3000ML PPV (MISCELLANEOUS) ×3 IMPLANT
CAP LOCKING (Cap) ×8 IMPLANT
CAP LOCKING 5.5 CREO (Cap) ×4 IMPLANT
CARTRIDGE OIL MAESTRO DRILL (MISCELLANEOUS) ×1 IMPLANT
CLOSURE WOUND 1/2 X4 (GAUZE/BANDAGES/DRESSINGS) ×2
CONT SPEC 4OZ CLIKSEAL STRL BL (MISCELLANEOUS) ×3 IMPLANT
COVER BACK TABLE 60X90IN (DRAPES) ×3 IMPLANT
DECANTER SPIKE VIAL GLASS SM (MISCELLANEOUS) ×3 IMPLANT
DERMABOND ADVANCED (GAUZE/BANDAGES/DRESSINGS) ×2
DERMABOND ADVANCED .7 DNX12 (GAUZE/BANDAGES/DRESSINGS) ×1 IMPLANT
DIFFUSER DRILL AIR PNEUMATIC (MISCELLANEOUS) ×3 IMPLANT
DRAPE C-ARM 42X72 X-RAY (DRAPES) ×6 IMPLANT
DRAPE C-ARMOR (DRAPES) ×3 IMPLANT
DRAPE HALF SHEET 40X57 (DRAPES) ×3 IMPLANT
DRAPE LAPAROTOMY 100X72X124 (DRAPES) ×3 IMPLANT
DRAPE POUCH INSTRU U-SHP 10X18 (DRAPES) ×3 IMPLANT
DRAPE SURG 17X23 STRL (DRAPES) ×3 IMPLANT
DRILL 5.0/4.0 (BIT) ×3
DRSG OPSITE 4X5.5 SM (GAUZE/BANDAGES/DRESSINGS) ×3 IMPLANT
DRSG OPSITE POSTOP 4X6 (GAUZE/BANDAGES/DRESSINGS) ×3 IMPLANT
DURAPREP 26ML APPLICATOR (WOUND CARE) ×3 IMPLANT
ELECT BLADE 4.0 EZ CLEAN MEGAD (MISCELLANEOUS) ×3
ELECT REM PT RETURN 9FT ADLT (ELECTROSURGICAL) ×3
ELECTRODE BLDE 4.0 EZ CLN MEGD (MISCELLANEOUS) ×1 IMPLANT
ELECTRODE REM PT RTRN 9FT ADLT (ELECTROSURGICAL) ×1 IMPLANT
EVACUATOR 3/16  PVC DRAIN (DRAIN)
EVACUATOR 3/16 PVC DRAIN (DRAIN) IMPLANT
GAUZE SPONGE 4X4 12PLY STRL (GAUZE/BANDAGES/DRESSINGS) ×3 IMPLANT
GAUZE SPONGE 4X4 16PLY XRAY LF (GAUZE/BANDAGES/DRESSINGS) ×3 IMPLANT
GLOVE BIO SURGEON STRL SZ7 (GLOVE) IMPLANT
GLOVE BIO SURGEON STRL SZ8 (GLOVE) ×6 IMPLANT
GLOVE BIOGEL PI IND STRL 7.0 (GLOVE) ×2 IMPLANT
GLOVE BIOGEL PI IND STRL 7.5 (GLOVE) ×1 IMPLANT
GLOVE BIOGEL PI INDICATOR 7.0 (GLOVE) ×4
GLOVE BIOGEL PI INDICATOR 7.5 (GLOVE) ×2
GLOVE ECLIPSE 7.5 STRL STRAW (GLOVE) IMPLANT
GLOVE EXAM NITRILE LRG STRL (GLOVE) IMPLANT
GLOVE EXAM NITRILE XL STR (GLOVE) IMPLANT
GLOVE EXAM NITRILE XS STR PU (GLOVE) IMPLANT
GLOVE INDICATOR 8.5 STRL (GLOVE) ×6 IMPLANT
GLOVE SURG SS PI 7.0 STRL IVOR (GLOVE) ×6 IMPLANT
GOWN STRL REUS W/ TWL LRG LVL3 (GOWN DISPOSABLE) ×1 IMPLANT
GOWN STRL REUS W/ TWL XL LVL3 (GOWN DISPOSABLE) ×2 IMPLANT
GOWN STRL REUS W/TWL 2XL LVL3 (GOWN DISPOSABLE) IMPLANT
GOWN STRL REUS W/TWL LRG LVL3 (GOWN DISPOSABLE) ×2
GOWN STRL REUS W/TWL XL LVL3 (GOWN DISPOSABLE) ×4
HEMOSTAT POWDER KIT SURGIFOAM (HEMOSTASIS) IMPLANT
KIT BASIN OR (CUSTOM PROCEDURE TRAY) ×3 IMPLANT
KIT ROOM TURNOVER OR (KITS) ×3 IMPLANT
MILL MEDIUM DISP (BLADE) ×3 IMPLANT
NEEDLE HYPO 21X1.5 SAFETY (NEEDLE) ×3 IMPLANT
NEEDLE HYPO 25X1 1.5 SAFETY (NEEDLE) ×3 IMPLANT
NS IRRIG 1000ML POUR BTL (IV SOLUTION) ×3 IMPLANT
OIL CARTRIDGE MAESTRO DRILL (MISCELLANEOUS) ×3
PACK LAMINECTOMY NEURO (CUSTOM PROCEDURE TRAY) ×3 IMPLANT
PAD ARMBOARD 7.5X6 YLW CONV (MISCELLANEOUS) ×15 IMPLANT
ROD CREO 50MM (Rod) ×6 IMPLANT
SCREW MOD 6.0-5.0X35MM (Screw) ×3 IMPLANT
SHAFT CREO 30MM (Neuro Prosthesis/Implant) ×9 IMPLANT
SPONGE LAP 4X18 X RAY DECT (DISPOSABLE) IMPLANT
SPONGE SURGIFOAM ABS GEL 100 (HEMOSTASIS) ×3 IMPLANT
STRIP CLOSURE SKIN 1/2X4 (GAUZE/BANDAGES/DRESSINGS) ×4 IMPLANT
SUT VIC AB 0 CT1 18XCR BRD8 (SUTURE) ×1 IMPLANT
SUT VIC AB 0 CT1 8-18 (SUTURE) ×2
SUT VIC AB 2-0 CT1 18 (SUTURE) ×3 IMPLANT
SUT VIC AB 4-0 PS2 27 (SUTURE) ×6 IMPLANT
SYR 20CC LL (SYRINGE) ×3 IMPLANT
SYR CONTROL 10ML LL (SYRINGE) IMPLANT
TOWEL GREEN STERILE (TOWEL DISPOSABLE) ×3 IMPLANT
TOWEL GREEN STERILE FF (TOWEL DISPOSABLE) ×3 IMPLANT
TRAY FOLEY W/METER SILVER 16FR (SET/KITS/TRAYS/PACK) ×3 IMPLANT
TULIP CREP AMP 5.5MM (Orthopedic Implant) ×12 IMPLANT
WATER STERILE IRR 1000ML POUR (IV SOLUTION) ×3 IMPLANT

## 2017-06-26 NOTE — Evaluation (Signed)
Physical Therapy Evaluation Patient Details Name: Penny Porter MRN: [id number] DOB: [birth year] Today's Date: 06/26/2017   History of Present Illness  Pt is a 51 y/o female s/p L405 PLIF. PMH includes anxiety, asthma, OSA, HTN, trigeminal nerve disease, and back surgery.   Clinical Impression  Patient is s/p above surgery resulting in the deficits listed below (see PT Problem List). PTA, pt was using cane for ambulation. Upon eval, pt limited by back pain, muscle fatigue, decreased strength, and decreased balance. Required min to min guard assist with use of cane this session. Reports sisters will be available at d/c and has all necessary DME. Pt with many questions regarding ADLs, so recommending OT consult to ensure safety with ADLs. Patient will benefit from skilled PT to increase their independence and safety with mobility (while adhering to their precautions) to allow discharge to the venue listed below. Will continue to follow acutely.      Follow Up Recommendations No PT follow up;Supervision for mobility/OOB    Equipment Recommendations  None recommended by PT    Recommendations for Other Services OT consult     Precautions / Restrictions Precautions Precautions: Back Precaution Booklet Issued: Yes (comment) Precaution Comments: Reviewed back precautions with pt.  Required Braces or Orthoses: Spinal Brace Spinal Brace: Lumbar corset;Applied in sitting position Restrictions Weight Bearing Restrictions: No      Mobility  Bed Mobility Overal bed mobility: Needs Assistance Bed Mobility: Rolling;Sidelying to Sit;Sit to Sidelying Rolling: Min assist Sidelying to sit: Min assist     Sit to sidelying: Min assist General bed mobility comments: Required min A and cues to ensure log roll technique. Min A for rolling and trunk elevation to come up to sitting. Min A for LE lift assit for return to sidelying.   Transfers Overall transfer level: Needs assistance Equipment used:  Straight cane Transfers: Sit to/from Stand Sit to Stand: Min assist         General transfer comment: Min A for lift assist and steadying upon standing. Performed sit<>stand X 2 from bed and from toilet this session.   Ambulation/Gait Ambulation/Gait assistance: Min guard;Min assist Ambulation Distance (Feet): 100 Feet Assistive device: Straight cane Gait Pattern/deviations: Step-through pattern;Decreased stride length;Trunk flexed Gait velocity: Decreased Gait velocity interpretation: Below normal speed for age/gender General Gait Details: Slow, guarded gait. Pt fatiguing easily and reports muscle fatigue in her back. Slightly unsteady, however, improved steadiness with increased distance. Required cues for upright posture during gait. Educated about use of RW if unsteadiness persists.   Stairs            Wheelchair Mobility    Modified Rankin (Stroke Patients Only)       Balance Overall balance assessment: Needs assistance Sitting-balance support: No upper extremity supported;Feet supported Sitting balance-Leahy Scale: Good     Standing balance support: During functional activity;Single extremity supported;No upper extremity supported Standing balance-Leahy Scale: Fair Standing balance comment: Able to maintain static standing without UE support                              Pertinent Vitals/Pain Pain Assessment: 0-10 Pain Score: 5  Pain Location: back  Pain Descriptors / Indicators: Aching;Operative site guarding Pain Intervention(s): Limited activity within patient's tolerance;Monitored during session;Repositioned    Home Living Family/patient expects to be discharged to:: Private residence Living Arrangements: Other relatives(sisters ) Available Help at Discharge: Family;Available 24 hours/day Type of Home: House Home Access: Stairs to enter Entrance  Stairs-Rails: None Entrance Stairs-Number of Steps: 1(threshold ) Home Layout: One level Home  Equipment: Walker - 2 wheels;Shower seat;Cane - single point      Prior Function Level of Independence: Independent with assistive device(s)         Comments: Used cane for ambulation      Hand Dominance        Extremity/Trunk Assessment   Upper Extremity Assessment Upper Extremity Assessment: Overall WFL for tasks assessed    Lower Extremity Assessment Lower Extremity Assessment: RLE deficits/detail;LLE deficits/detail RLE Deficits / Details: Reported pain had improved that radiated down RLE prior to surgery.  LLE Deficits / Details: Reported pulling sensation had improved that went down posterior LLE.    Cervical / Trunk Assessment Cervical / Trunk Assessment: Other exceptions Cervical / Trunk Exceptions: s/p PLIF   Communication   Communication: No difficulties  Cognition Arousal/Alertness: Awake/alert Behavior During Therapy: WFL for tasks assessed/performed Overall Cognitive Status: Within Functional Limits for tasks assessed                                        General Comments      Exercises     Assessment/Plan    PT Assessment Patient needs continued PT services  PT Problem List Decreased strength;Decreased balance;Decreased mobility;Decreased knowledge of use of DME;Decreased knowledge of precautions;Pain       PT Treatment Interventions DME instruction;Gait training;Functional mobility training;Stair training;Therapeutic exercise;Therapeutic activities;Balance training;Neuromuscular re-education;Patient/family education    PT Goals (Current goals can be found in the Care Plan section)  Acute Rehab PT Goals Patient Stated Goal: to go home  PT Goal Formulation: With patient Time For Goal Achievement: 07/03/17 Potential to Achieve Goals: Good    Frequency Min 5X/week   Barriers to discharge        Co-evaluation               AM-PAC PT "6 Clicks" Daily Activity  Outcome Measure Difficulty turning over in bed  (including adjusting bedclothes, sheets and blankets)?: Unable Difficulty moving from lying on back to sitting on the side of the bed? : Unable Difficulty sitting down on and standing up from a chair with arms (e.g., wheelchair, bedside commode, etc,.)?: Unable Help needed moving to and from a bed to chair (including a wheelchair)?: A Little Help needed walking in hospital room?: A Little Help needed climbing 3-5 steps with a railing? : A Lot 6 Click Score: 11    End of Session Equipment Utilized During Treatment: Back brace;Gait belt Activity Tolerance: Patient limited by pain;Patient limited by fatigue Patient left: in bed;with call bell/phone within reach Nurse Communication: Mobility status PT Visit Diagnosis: Other abnormalities of gait and mobility (R26.89);Pain Pain - part of body: (back )    Time: 1856-3149 PT Time Calculation (min) (ACUTE ONLY): 23 min   Charges:   PT Evaluation $PT Eval Low Complexity: 1 Low PT Treatments $Gait Training: 8-22 mins   PT G Codes:        Leighton Ruff, PT, DPT  Acute Rehabilitation Services  Pager: [phone]   Rudean Hitt 06/26/2017, 6:25 PM

## 2017-06-26 NOTE — Anesthesia Postprocedure Evaluation (Signed)
Anesthesia Post Note  Patient: Penny Porter  Procedure(s) Performed: LUMBAR FOUR-FIVE POSTERIOR LUMBAR INTERBODY FUSION (N/A Spine Lumbar)     Patient location during evaluation: PACU Anesthesia Type: General Level of consciousness: awake and alert Pain management: pain level controlled Vital Signs Assessment: post-procedure vital signs reviewed and stable Respiratory status: spontaneous breathing, nonlabored ventilation and respiratory function stable Cardiovascular status: blood pressure returned to baseline and stable Postop Assessment: no apparent nausea or vomiting Anesthetic complications: no    Last Vitals:  Vitals:   06/26/17 1200 06/26/17 1212  BP: (!) 144/92   Pulse:  (!) 101  Resp:  19  Temp:    SpO2:  98%    Last Pain:  Vitals:   06/26/17 1212  TempSrc:   PainSc: [place]

## 2017-06-26 NOTE — Transfer of Care (Signed)
Immediate Anesthesia Transfer of Care Note  Patient: Penny Porter  Procedure(s) Performed: LUMBAR FOUR-FIVE POSTERIOR LUMBAR INTERBODY FUSION (N/A Spine Lumbar)  Patient Location: PACU  Anesthesia Type:General  Level of Consciousness: awake, alert  and oriented  Airway & Oxygen Therapy: Patient Spontanous Breathing and Patient connected to nasal cannula oxygen  Post-op Assessment: Report given to RN, Post -op Vital signs reviewed and stable and Patient moving all extremities X 4  Post vital signs: Reviewed and stable  Last Vitals:  Vitals:   06/26/17 0627  BP: (!) 139/91  Pulse: 96  Resp: 18  Temp: 37 C  SpO2: 97%    Last Pain:  Vitals:   06/26/17 0627  TempSrc: Oral  PainSc:       Patients Stated Pain Goal: 5 (22/24/11 4643)  Complications: No apparent anesthesia complications

## 2017-06-26 NOTE — H&P (Signed)
Penny Porter is an 51 y.o. female.   Chief Complaint: Back and right greater than left leg pain HPI: 51 year old female with long-standing back and leg pain undergone 2 previous discectomies at L4-5. Patient presents with recurrent right L5 radicular pain workup revealed progressive collapse and recurrent disc herniation L4-5. Due to patient's 2 previous laminectomies and discectomies and now third recurrence I recommended decompression stabilization procedure at L4-5. I've extensively gone over the risks and benefits of the operation with the patient as well as perioperative course expectations of outcome and alternatives of surgery and she understood and agreed to proceed forward.  Past Medical History:  Diagnosis Date  . Anxiety   . Arthritis    Back and hip  . Asthma   . Bursitis   . Complication of anesthesia    bladder hard to wake up, never needed a in and out cath  . Costochondral chest pain   . Degenerative disk disease   . Depression   . Gastroparesis   . GERD (gastroesophageal reflux disease)   . Hypertension   . Lumbar herniated disc   . Sleep apnea    not using CPAP  . Stenosis of lumbosacral spine   . Thyroid nodule   . Tourette disease   . Trigeminal nerve disease     Past Surgical History:  Procedure Laterality Date  . BACK SURGERY  September 19, 2015  . bone spur removal on back    . CHOLECYSTECTOMY  2005  . COLONOSCOPY    . ELBOW SURGERY Left May 29, 2014  . ESOPHAGOGASTRODUODENOSCOPY    . KNEE SURGERY  06/2016  . polyp removal    . ruptured disk  2008  . TEAR DUCT PROBING  2009    Family History  Problem Relation Age of Onset  . Mental retardation Sister   . Dementia Father   . Suicidality Neg Hx    Social History:  reports that  has never smoked. she has never used smokeless tobacco. She reports that she does not drink alcohol or use drugs.  Allergies:  Allergies  Allergen Reactions  . Amoxicillin Hives and Swelling    LIP SWELLING PATIENT HAS  HAD A PCN REACTION WITH IMMEDIATE RASH, FACIAL/TONGUE/THROAT SWELLING, SOB, OR LIGHTHEADEDNESS WITH HYPOTENSION:  #  #  #  YES  #  #  #   HAS PT DEVELOPED SEVERE RASH INVOLVING MUCUS MEMBRANES or SKIN NECROSIS: #  #  #  YES  #  #  #  Has patient had a PCN reaction that required hospitalization: No Has patient had a PCN reaction occurring within the last 10 years: Unknown If all of the above answers are "NO", then may proceed with Cephalosporin use.   . Avelox [Moxifloxacin Hcl In Nacl] Other (See Comments)    Caused pain in shoulders to finger, tendonitis  . Moxifloxacin Other (See Comments)    Caused pain in shoulders to finger, tendonitis  . Adhesive [Tape] Dermatitis    Blisters skin  . Bextra [Valdecoxib] Hives and Swelling    SWELLING REACTION UNSPECIFIED   . Doxycycline Hyclate Hives and Swelling    SWELLING REACTION UNSPECIFIED   . Gadobutrol Hives  . Gadolinium Derivatives Hives  . Gadoversetamide Hives  . Iodinated Diagnostic Agents Hives  . Lamictal [Lamotrigine] Hives  . Naproxen Hypertension  . Percolone [Oxycodone] Hives  . Relafen [Nabumetone] Hypertension  . Risperdal [Risperidone] Other (See Comments)    Leg weakness   . Fetzima [Levomilnacipran] Other (See Comments)  Tremors across head   . Nitrofurantoin Other (See Comments)    UNSPECIFIED REACTION   . Brintellix [Vortioxetine] Nausea And Vomiting    Medications Prior to Admission  Medication Sig Dispense Refill  . albuterol (PROAIR HFA) 108 (90 Base) MCG/ACT inhaler Inhale two puffs every four to six hours as needed for cough or wheeze. (Patient taking differently: Inhale 2 puffs into the lungs every 4 (four) hours as needed for wheezing or shortness of breath. Inhale two puffs every four to six hours as needed for cough or wheeze.) 3 Inhaler 0  . ARIPiprazole (ABILIFY) 20 MG tablet Take 1 tablet (20 mg total) by mouth daily. 90 tablet 0  . aspirin 81 MG tablet Take 81 mg by mouth daily.    Penny Kitchen azithromycin  (ZITHROMAX) 250 MG tablet TAKE ONE TABLET 3 TIMES WEEKLY 12 tablet 3  . BENICAR 40 MG tablet Take 20 mg by mouth daily at 12 noon.   4  . buPROPion (WELLBUTRIN XL) 300 MG 24 hr tablet TAKE 1 TABLET (300 MG TOTAL) BY MOUTH EVERY MORNING. (Patient taking differently: Take 300 mg by mouth daily. ) 90 tablet 0  . carvedilol (COREG) 3.125 MG tablet Take 1.5625 mg by mouth 2 (two) times daily with a meal. Takes 0.5 tablet twice daily    . Cholecalciferol (VITAMIN D-3) 5000 UNITS TABS Take 5,000 Units by mouth daily.     . clotrimazole-betamethasone (LOTRISONE) cream APPLY SPARINGLY TO THE AFFECTED AREA(S) TWICE DAILY AS NEEDED FOR IRRITATION  5  . furosemide (LASIX) 20 MG tablet Take 20 mg by mouth daily. Take 1-2 tabs po up to twice a day as needed for edema     . hydrOXYzine (ATARAX/VISTARIL) 10 MG tablet Take 1 tablet (10 mg total) by mouth 3 (three) times daily as needed for anxiety. (Patient taking differently: Take 10 mg by mouth 2 (two) times daily. ) 270 tablet 0  . ketoconazole (NIZORAL) 2 % shampoo Apply 1 application topically as needed for irritation.     Penny Kitchen loratadine (CLARITIN) 10 MG tablet Take 10 mg by mouth at bedtime.     . ondansetron (ZOFRAN-ODT) 4 MG disintegrating tablet Take 4 mg by mouth 2 (two) times daily as needed for nausea or vomiting.    . pregabalin (LYRICA) 75 MG capsule Take 75 mg by mouth 2 (two) times daily. Reported on 10/09/2015    . RABEprazole (ACIPHEX) 20 MG tablet Take 20 mg by mouth daily.    . ranitidine (ZANTAC) 300 MG tablet Take 300 mg by mouth at bedtime.    Penny Kitchen spironolactone (ALDACTONE) 25 MG tablet Take 25 mg by mouth daily.    . tizanidine (ZANAFLEX) 2 MG capsule Take 2 mg by mouth daily as needed for muscle spasms.     Penny Kitchen triamcinolone cream (KENALOG) 0.1 % Apply 1 application topically 2 (two) times daily as needed (IRRITATION).     Penny Kitchen triamcinolone ointment (KENALOG) 0.5 % Apply 1 application topically 2 (two) times daily as needed (IRRITATION).     Penny Kitchen  Acetaminophen (TYLENOL PO) Take 1 tablet by mouth daily as needed (PAIN).    Penny Kitchen albuterol (PROVENTIL) (2.5 MG/3ML) 0.083% nebulizer solution PLACE 1 VIAL IN NEBULIZER EVERY 6 HOURS AS NEEDED FOR WHEEZING UP TO 30 DAYS  5  . lubiprostone (AMITIZA) 24 MCG capsule Take 24 mcg by mouth daily as needed for constipation.     . meloxicam (MOBIC) 7.5 MG tablet Take 7.5 mg by mouth 2 (two) times daily.  0  .  mupirocin ointment (BACTROBAN) 2 % Place 1 application into the nose 2 (two) times daily as needed (IRRITATION).     Penny Kitchen QVAR 80 MCG/ACT inhaler USE 1 PUFF DAILY TO PREVENT COUGH/WHEEZE-RINSE,GARGLE & SPIT AFTER USE-INCREASE TO 3 PUFFS W/FLAREUP (Patient taking differently: USE 1 PUFF DAILY AS NEEDED FOR COUGH/WHEEZE-RINSE,GARGLE & SPIT AFTER USE-INCREASE TO 3 PUFFS W/FLAREUP) 8.7 g 5  . traMADol (ULTRAM) 50 MG tablet Take 50 mg by mouth every 6 (six) hours as needed for moderate pain.     . vitamin B-12 (CYANOCOBALAMIN) 1000 MCG tablet Take 1,000 mcg by mouth daily.      No results found for this or any previous visit (from the past 48 hour(s)). No results found.  Review of Systems  Musculoskeletal: Positive for back pain, joint pain and myalgias.    Blood pressure (!) 139/91, pulse 96, temperature 98.6 F (37 C), temperature source Oral, resp. rate 18, weight 116.5 kg (256 lb 12.8 oz), SpO2 97 %. Physical Exam  Constitutional: She is oriented to person, place, and time. She appears well-developed and well-nourished.  HENT:  Head: Normocephalic.  Eyes: Pupils are equal, round, and reactive to light.  Neck: Normal range of motion.  GI: Soft. Bowel sounds are normal.  Neurological: She is alert and oriented to person, place, and time. She has normal strength. GCS eye subscore is 4. GCS verbal subscore is 5. GCS motor subscore is 6.  Patient is awake alert strength is 5 out of 5 upper and lower extremities     Assessment/Plan 51 year old female presents for posterior lumbar interbody fusion  L4-5  Samara Porter P, MD 06/26/2017, 7:22 AM

## 2017-06-26 NOTE — Op Note (Signed)
Preoperative diagnosis: Recurrent disc herniation L4-5 with bilateral right greater left L5 radiculopathy  Postoperative diagnosis: Same  Procedure: Redo decompressive lumbar laminectomy L4-5 and excess and requiring more work to would be needed with a standard interbody fusion with complete facetectomies radical foraminotomies of the L4 and L5 nerve roots bilaterally  #2 posterior lumbar interbody fusion L4-5 utilizing the globus rise expandable cage system packed with locally harvested autograft mixed with vivigen  #3 cortical screw fixation L4-5 utilizing the globus Creole modular cortical cortical screw set  Surgeon: Penny Porter  Asst.: Dr. Consuella Porter  Anesthesia: Gen.  EBL: Minimal  History of present illness: Patient is a pleasant 51 year old female had previous laminectomies and redo laminectomy for ruptured disc and recurrent ruptured disc who is now had her second recurrence or a third disc herniation L4-5. Patient failed all forms of conservative treatment due to the fact she said 3 disc herniations at this level I recommended decompression stabilization procedure. I extensively went over the risks and benefits of the operation with her as well as perioperative course expectations of outcome and alternatives of surgery and she understands and agrees to proceed forward.  Operative procedure: Patient brought into the or was decent general anesthesia positioned prone the Wilson frame her back was prepped and draped in routine sterile fashion. Her old incision was opened up and extended several caudally after infiltration of 10 mL lidocaine with epi scar tissues dissected free and subperiosteal dissections care lamina of L4 and L5 bilaterally. I exposed the cortical screw entry point at L4 and utilizing AP and lateral fluoroscopy placed 2-60/50 by 30 mm cortical screws. Then removed the spinous process of L4 performed a central decompression working on the left side initially with  complete facetectomies radical foraminotomies of the L4 and L5 nerve root there was marked spondylosis and foraminal stenosis from a disc herniation on the left as well as marked facet arthropathy. Then working through the scar tissue on the right freed up scar tissue performed a complete medial facetectomy there is well given also for performing radical from the L4 and L5 nerve root. Then working first in the interbody space epidural veins were cauterized disc space was cleaned out bilaterally there was a large recurrent disc encased with scar in the right I removed all this prepared the endplates with sequential distraction I inserted and 05/27/2014 degrees lordosis 26 Milner length cages and packing locally harvested autograft mixed withvivigen centrally. All cages were expanded approximate 3 turns opening up the disc space ankle generating some lordosis at that level. Then using fluoroscopy I placed 2 cortical screws at L5 and then the wound scope was irrigated meticulous hemostasis was maintained the foraminal reinspected confirm patency no migration of graft material. I sent the rods anchored in all the knots speckled vancomycin powder in the wound and placed Gelfoam over the dura placed a medium Hemovac drain. Then closed in layers after injecting the fascia with Exparel. Closed in layers with after Vicryl and a running 4 septic reticular Dermabond benzo and Steri-Strips and sterile dressings applied patient recovered in stable condition. At the end the case on it counts sponge counts were correct.

## 2017-06-26 NOTE — Anesthesia Preprocedure Evaluation (Signed)
Anesthesia Evaluation  Patient identified by MRN, date of birth, ID band Patient awake    Reviewed: Allergy & Precautions, NPO status , Patient's Chart, lab work & pertinent test results  Airway Mallampati: II  TM Distance: >3 FB Neck ROM: Full    Dental no notable dental hx.    Pulmonary neg pulmonary ROS, asthma , sleep apnea ,    Pulmonary exam normal breath sounds clear to auscultation       Cardiovascular hypertension, Pt. on medications negative cardio ROS Normal cardiovascular exam Rhythm:Regular Rate:Normal     Neuro/Psych Anxiety Depression negative neurological ROS  negative psych ROS   GI/Hepatic negative GI ROS, Neg liver ROS, GERD  ,  Endo/Other  negative endocrine ROS  Renal/GU negative Renal ROS  negative genitourinary   Musculoskeletal negative musculoskeletal ROS (+) Arthritis , Osteoarthritis,    Abdominal   Peds negative pediatric ROS (+)  Hematology negative hematology ROS (+)   Anesthesia Other Findings   Reproductive/Obstetrics negative OB ROS                             Anesthesia Physical Anesthesia Plan  ASA: II  Anesthesia Plan: General   Post-op Pain Management:    Induction: Intravenous  PONV Risk Score and Plan: 3 and Ondansetron, Dexamethasone and Midazolam  Airway Management Planned: Oral ETT  Additional Equipment:   Intra-op Plan:   Post-operative Plan: Extubation in OR  Informed Consent: I have reviewed the patients History and Physical, chart, labs and discussed the procedure including the risks, benefits and alternatives for the proposed anesthesia with the patient or authorized representative who has indicated his/her understanding and acceptance.   Dental advisory given  Plan Discussed with: CRNA  Anesthesia Plan Comments:         Anesthesia Quick Evaluation

## 2017-06-26 NOTE — Progress Notes (Signed)
Pharmacy Antibiotic Note  Penny Porter is a 51 y.o. female admitted on 06/26/2017 for lumbar surgery.  Pharmacy has been consulted for Vancomycin dosing for post-op prophylaxis.  Pt currently has a posterior drain in place.  Pt received Vancomycin 1500mg  IV x 1 pre-op at 0740.  CrCl ~ 78 ml/min.  Plan: Start Vancomycin 1gm IV q12h - next dose at 6pm Follow-up length of therapy / drain removal. Goal trough 10-15 mcg/ml.  Weight: 256 lb 12.8 oz (116.5 kg)  Temp (24hrs), Avg:98.3 F (36.8 C), Min:97.9 F (36.6 C), Max:98.6 F (37 C)  No results for input(s): WBC, CREATININE, LATICACIDVEN, VANCOTROUGH, VANCOPEAK, VANCORANDOM, GENTTROUGH, GENTPEAK, GENTRANDOM, TOBRATROUGH, TOBRAPEAK, TOBRARND, AMIKACINPEAK, AMIKACINTROU, AMIKACIN in the last 168 hours.  Estimated Creatinine Clearance: 94.2 mL/min (by C-G formula based on SCr of 0.97 mg/dL).    Allergies  Allergen Reactions  . Amoxicillin Hives and Swelling    LIP SWELLING PATIENT HAS HAD A PCN REACTION WITH IMMEDIATE RASH, FACIAL/TONGUE/THROAT SWELLING, SOB, OR LIGHTHEADEDNESS WITH HYPOTENSION:  #  #  #  YES  #  #  #   HAS PT DEVELOPED SEVERE RASH INVOLVING MUCUS MEMBRANES or SKIN NECROSIS: #  #  #  YES  #  #  #  Has patient had a PCN reaction that required hospitalization: No Has patient had a PCN reaction occurring within the last 10 years: Unknown If all of the above answers are "NO", then may proceed with Cephalosporin use.   . Avelox [Moxifloxacin Hcl In Nacl] Other (See Comments)    Caused pain in shoulders to finger, tendonitis  . Moxifloxacin Other (See Comments)    Caused pain in shoulders to finger, tendonitis  . Adhesive [Tape] Dermatitis    Blisters skin  . Bextra [Valdecoxib] Hives and Swelling    SWELLING REACTION UNSPECIFIED   . Doxycycline Hyclate Hives and Swelling    SWELLING REACTION UNSPECIFIED   . Gadobutrol Hives  . Gadolinium Derivatives Hives  . Gadoversetamide Hives  . Iodinated Diagnostic Agents Hives   . Lamictal [Lamotrigine] Hives  . Naproxen Hypertension  . Percolone [Oxycodone] Hives  . Relafen [Nabumetone] Hypertension  . Risperdal [Risperidone] Other (See Comments)    Leg weakness   . Fetzima [Levomilnacipran] Other (See Comments)    Tremors across head   . Nitrofurantoin Other (See Comments)    UNSPECIFIED REACTION   . Brintellix [Vortioxetine] Nausea And Vomiting     Thank you for allowing pharmacy to be a part of this patient's care.  [employer], Pharm.D., BCPS Clinical Pharmacist Pager: [phone] Clinical phone for 06/26/2017 from 8:30-4:00 is [phone]. After 4pm, please call Main [facility] [phone]) for assistance. 06/26/2017 3:07 PM

## 2017-06-26 NOTE — Anesthesia Procedure Notes (Signed)
Procedure Name: Intubation Date/Time: 06/26/2017 7:40 AM Performed by: Mariea Clonts, CRNA Pre-anesthesia Checklist: Patient identified, Emergency Drugs available, Suction available and Patient being monitored Patient Re-evaluated:Patient Re-evaluated prior to induction Oxygen Delivery Method: Circle System Utilized Preoxygenation: Pre-oxygenation with 100% oxygen Induction Type: IV induction and Cricoid Pressure applied Ventilation: Mask ventilation without difficulty and Oral airway inserted - appropriate to patient size Laryngoscope Size: Sabra Heck and 2 Grade View: Grade II Tube type: Oral Tube size: 7.5 mm Number of attempts: 1 Airway Equipment and Method: Stylet and Oral airway Placement Confirmation: ETT inserted through vocal cords under direct vision,  positive ETCO2 and breath sounds checked- equal and bilateral Tube secured with: Tape Dental Injury: Teeth and Oropharynx as per pre-operative assessment

## 2017-06-27 MED ORDER — OXYCODONE HCL 5 MG PO TABS: 5.0000 mg | ORAL_TABLET | ORAL | 0 refills | Status: DC | PRN

## 2017-06-27 MED ORDER — CYCLOBENZAPRINE HCL 10 MG PO TABS: 10.0000 mg | ORAL_TABLET | Freq: Three times a day (TID) | ORAL | 2 refills | Status: DC | PRN

## 2017-06-27 NOTE — Progress Notes (Signed)
Patient alert and oriented, mae's well, voiding adequate amount of urine, swallowing without difficulty, no c/o pain at time of discharge. Patient discharged home with family. Script and discharged instructions given to patient. Patient and family stated understanding of instructions given. Patient has an appointment with Dr. Cram 

## 2017-06-27 NOTE — Plan of Care (Signed)
  Progressing Activity: Ability to avoid complications of mobility impairment will improve 06/27/2017 0637 - Progressing by Jonnie Finner, RN Ability to tolerate increased activity will improve 06/27/2017 0637 - Progressing by Jonnie Finner, RN Will remain free from falls 06/27/2017 [phone] - Progressing by Jonnie Finner, RN Education: Ability to verbalize activity precautions or restrictions will improve 06/27/2017 0637 - Progressing by Jonnie Finner, RN Knowledge of the prescribed therapeutic regimen will improve 06/27/2017 0637 - Progressing by Jonnie Finner, RN Understanding of discharge needs will improve 06/27/2017 [phone] - Progressing by Jonnie Finner, RN Physical Regulation: Ability to maintain clinical measurements within normal limits will improve 06/27/2017 0637 - Progressing by Jonnie Finner, RN Postoperative complications will be avoided or minimized 06/27/2017 0637 - Progressing by Jonnie Finner, RN Diagnostic test results will improve 06/27/2017 [phone] - Progressing by Jonnie Finner, RN Pain Management: Pain level will decrease 06/27/2017 0637 - Progressing by Jonnie Finner, RN Health Behavior/Discharge Planning: Identification of resources available to assist in meeting health care needs will improve 06/27/2017 0637 - Progressing by Jonnie Finner, RN

## 2017-06-27 NOTE — Progress Notes (Signed)
Physical Therapy Treatment Patient Details Name: Penny Porter MRN: [id number] DOB: 1965/09/02 Today's Date: 06/27/2017    History of Present Illness Pt is a 51 y/o female s/p L4-L5 PLIF. PMH includes anxiety, asthma, OSA, HTN, trigeminal nerve disease, and back surgery.     PT Comments    Pt making good progress with mobility. Plan for d/c home today with family to assist.   Follow Up Recommendations  No PT follow up;Supervision for mobility/OOB     Equipment Recommendations  None recommended by PT    Recommendations for Other Services       Precautions / Restrictions Precautions Precautions: Back Precaution Booklet Issued: Yes (comment) Precaution Comments: Reviewed back precautions with pt.  Required Braces or Orthoses: Spinal Brace Spinal Brace: Lumbar corset;Applied in sitting position Restrictions Weight Bearing Restrictions: No    Mobility  Bed Mobility               General bed mobility comments: sitting EOB upon arrival  Transfers Overall transfer level: Needs assistance Equipment used: Straight cane Transfers: Sit to/from Stand Sit to Stand: Min guard         General transfer comment: for safety  Ambulation/Gait Ambulation/Gait assistance: Min guard Ambulation Distance (Feet): 500 Feet Assistive device: Straight cane Gait Pattern/deviations: Step-through pattern;Decreased stride length;Trunk flexed Gait velocity: Decreased Gait velocity interpretation: Below normal speed for age/gender General Gait Details: pt with slow, guarded gait and mildly unsteady but no overt LOB or need for physical assistance, min guard for safety with use of SPC   Stairs            Wheelchair Mobility    Modified Rankin (Stroke Patients Only)       Balance Overall balance assessment: Needs assistance Sitting-balance support: No upper extremity supported;Feet supported Sitting balance-Leahy Scale: Good     Standing balance support: During functional  activity;Single extremity supported;No upper extremity supported Standing balance-Leahy Scale: Poor Standing balance comment: reliant on cane                            Cognition Arousal/Alertness: Awake/alert Behavior During Therapy: WFL for tasks assessed/performed Overall Cognitive Status: Within Functional Limits for tasks assessed                                        Exercises      General Comments        Pertinent Vitals/Pain Pain Assessment: 0-10 Pain Score: 4  Pain Location: back  Pain Descriptors / Indicators: Aching;Operative site guarding Pain Intervention(s): Monitored during session;Repositioned    Home Living                      Prior Function            PT Goals (current goals can now be found in the care plan section) Acute Rehab PT Goals PT Goal Formulation: With patient Time For Goal Achievement: 07/03/17 Potential to Achieve Goals: Good Progress towards PT goals: Progressing toward goals    Frequency    Min 5X/week      PT Plan Current plan remains appropriate    Co-evaluation              AM-PAC PT "6 Clicks" Daily Activity  Outcome Measure  Difficulty turning over in bed (including adjusting bedclothes, sheets and blankets)?: A Little Difficulty  moving from lying on back to sitting on the side of the bed? : A Little Difficulty sitting down on and standing up from a chair with arms (e.g., wheelchair, bedside commode, etc,.)?: Unable Help needed moving to and from a bed to chair (including a wheelchair)?: A Little Help needed walking in hospital room?: A Little Help needed climbing 3-5 steps with a railing? : A Lot 6 Click Score: 15    End of Session Equipment Utilized During Treatment: Back brace;Gait belt Activity Tolerance: Patient tolerated treatment well Patient left: with call bell/phone within reach;with family/visitor present;Other (comment)(sitting EOB) Nurse Communication:  Mobility status PT Visit Diagnosis: Other abnormalities of gait and mobility (R26.89);Pain Pain - part of body: (back)     Time: 2334-3568 PT Time Calculation (min) (ACUTE ONLY): 15 min  Charges:  $Gait Training: 8-22 mins                    G Codes:       [place], [place], [place] 06/27/2017, 9:25 AM

## 2017-06-27 NOTE — Discharge Summary (Signed)
Physician Discharge Summary  Patient ID: Penny Porter MRN: [id number] DOB/AGE: Jun 08, 1966 51 y.o.  Admit date: 06/26/2017 Discharge date: 06/27/2017  Admission Diagnoses:  Lumbar HNP  Discharge Diagnoses:  Same Active Problems:   HNP (herniated nucleus pulposus), lumbar  Discharged Condition: Stable  Hospital Course:  Penny Porter is a 51 y.o. female who was admitted for the below procedure. There were no post operative complications. At time of discharge, pain was well controlled, ambulating with Pt/OT, tolerating po, voiding normal. Ready for discharge.  Treatments: Surgery Redo decompressive lumbar laminectomy L4-5 and excess and requiring more work to would be needed with a standard interbody fusion with complete facetectomies radical foraminotomies of the L4 and L5 nerve roots bilaterally  #2 posterior lumbar interbody fusion L4-5 utilizing the globus rise expandable cage system packed with locally harvested autograft mixed with vivigen  #3 cortical screw fixation L4-5 utilizing the globus Creole modular cortical cortical screw set    Discharge Exam: Blood pressure 116/85, pulse 93, temperature 98 F (36.7 C), temperature source Oral, resp. rate 18, weight 116.5 kg (256 lb 12.8 oz), SpO2 97 %. Awake, alert, oriented Speech fluent, appropriate CN grossly intact 5/5 BUE/BLE Wound c/d/i  Disposition: ED Dismiss - Never Arrived   Allergies as of 06/27/2017      Reactions   Amoxicillin Hives, Swelling   LIP SWELLING PATIENT HAS HAD A PCN REACTION WITH IMMEDIATE RASH, FACIAL/TONGUE/THROAT SWELLING, SOB, OR LIGHTHEADEDNESS WITH HYPOTENSION:  #  #  #  YES  #  #  #   HAS PT DEVELOPED SEVERE RASH INVOLVING MUCUS MEMBRANES or SKIN NECROSIS: #  #  #  YES  #  #  #  Has patient had a PCN reaction that required hospitalization: No Has patient had a PCN reaction occurring within the last 10 years: Unknown If all of the above answers are "NO", then may proceed with Cephalosporin  use.   Penny Porter [moxifloxacin Hcl In Nacl] Other (See Comments)   Caused pain in shoulders to finger, tendonitis   Moxifloxacin Other (See Comments)   Caused pain in shoulders to finger, tendonitis   Adhesive [tape] Dermatitis   Blisters skin   Bextra [valdecoxib] Hives, Swelling   SWELLING REACTION UNSPECIFIED    Doxycycline Hyclate Hives, Swelling   SWELLING REACTION UNSPECIFIED    Gadobutrol Hives   Gadolinium Derivatives Hives   Gadoversetamide Hives   Iodinated Diagnostic Agents Hives   Lamictal [lamotrigine] Hives   Naproxen Hypertension   Percolone [oxycodone] Hives   Relafen [nabumetone] Hypertension   Risperdal [risperidone] Other (See Comments)   Leg weakness   Fetzima [levomilnacipran] Other (See Comments)   Tremors across head   Nitrofurantoin Other (See Comments)   UNSPECIFIED REACTION    Brintellix [vortioxetine] Nausea And Vomiting      Medication List    STOP taking these medications   tizanidine 2 MG capsule Commonly known as:  ZANAFLEX   traMADol 50 MG tablet Commonly known as:  ULTRAM     TAKE these medications   albuterol 108 (90 Base) MCG/ACT inhaler Commonly known as:  PROAIR HFA Inhale two puffs every four to six hours as needed for cough or wheeze. What changed:    how much to take  how to take this  when to take this  reasons to take this  additional instructions   albuterol (2.5 MG/3ML) 0.083% nebulizer solution Commonly known as:  PROVENTIL PLACE 1 VIAL IN NEBULIZER EVERY 6 HOURS AS NEEDED FOR WHEEZING UP TO 30 DAYS  What changed:  Another medication with the same name was changed. Make sure you understand how and when to take each.   ARIPiprazole 20 MG tablet Commonly known as:  ABILIFY Take 1 tablet (20 mg total) by mouth daily.   aspirin 81 MG tablet Take 81 mg by mouth daily.   azithromycin 250 MG tablet Commonly known as:  ZITHROMAX TAKE ONE TABLET 3 TIMES WEEKLY   BENICAR 40 MG tablet Generic drug:  olmesartan Take  20 mg by mouth daily at 12 noon.   buPROPion 300 MG 24 hr tablet Commonly known as:  WELLBUTRIN XL TAKE 1 TABLET (300 MG TOTAL) BY MOUTH EVERY MORNING. What changed:    how much to take  how to take this  when to take this  additional instructions   carvedilol 3.125 MG tablet Commonly known as:  COREG Take 1.5625 mg by mouth 2 (two) times daily with a meal. Takes 0.5 tablet twice daily   clotrimazole-betamethasone cream Commonly known as:  LOTRISONE APPLY SPARINGLY TO THE AFFECTED AREA(S) TWICE DAILY AS NEEDED FOR IRRITATION   cyclobenzaprine 10 MG tablet Commonly known as:  FLEXERIL Take 1 tablet (10 mg total) 3 (three) times daily as needed by mouth for muscle spasms.   furosemide 20 MG tablet Commonly known as:  LASIX Take 20 mg by mouth daily. Take 1-2 tabs po up to twice a day as needed for edema   hydrOXYzine 10 MG tablet Commonly known as:  ATARAX/VISTARIL Take 1 tablet (10 mg total) by mouth 3 (three) times daily as needed for anxiety. What changed:  when to take this   ketoconazole 2 % shampoo Commonly known as:  NIZORAL Apply 1 application topically as needed for irritation.   loratadine 10 MG tablet Commonly known as:  CLARITIN Take 10 mg by mouth at bedtime.   lubiprostone 24 MCG capsule Commonly known as:  AMITIZA Take 24 mcg by mouth daily as needed for constipation.   meloxicam 7.5 MG tablet Commonly known as:  MOBIC Take 7.5 mg by mouth 2 (two) times daily.   mupirocin ointment 2 % Commonly known as:  BACTROBAN Place 1 application into the nose 2 (two) times daily as needed (IRRITATION).   ondansetron 4 MG disintegrating tablet Commonly known as:  ZOFRAN-ODT Take 4 mg by mouth 2 (two) times daily as needed for nausea or vomiting.   oxyCODONE 5 MG immediate release tablet Commonly known as:  ROXICODONE Take 1 tablet (5 mg total) every 4 (four) hours as needed by mouth.   pregabalin 75 MG capsule Commonly known as:  LYRICA Take 75 mg by  mouth 2 (two) times daily. Reported on 10/09/2015   QVAR 80 MCG/ACT inhaler Generic drug:  beclomethasone USE 1 PUFF DAILY TO PREVENT COUGH/WHEEZE-RINSE,GARGLE & SPIT AFTER USE-INCREASE TO 3 PUFFS W/FLAREUP What changed:  See the new instructions.   RABEprazole 20 MG tablet Commonly known as:  ACIPHEX Take 20 mg by mouth daily.   ranitidine 300 MG tablet Commonly known as:  ZANTAC Take 300 mg by mouth at bedtime.   spironolactone 25 MG tablet Commonly known as:  ALDACTONE Take 25 mg by mouth daily.   triamcinolone cream 0.1 % Commonly known as:  KENALOG Apply 1 application topically 2 (two) times daily as needed (IRRITATION).   triamcinolone ointment 0.5 % Commonly known as:  KENALOG Apply 1 application topically 2 (two) times daily as needed (IRRITATION).   TYLENOL PO Take 1 tablet by mouth daily as needed (PAIN).  vitamin B-12 1000 MCG tablet Commonly known as:  CYANOCOBALAMIN Take 1,000 mcg by mouth daily.   Vitamin D-3 5000 units Tabs Take 5,000 Units by mouth daily.      Follow-up Information    Donalee Citrin, MD Follow up.   Specialty:  Neurosurgery Contact information: [address] [place] [address] [phone]           Signed: Alyson Ingles 06/27/2017, 8:19 AM

## 2017-07-06 ENCOUNTER — Encounter (HOSPITAL_COMMUNITY): Payer: Self-pay | Admitting: Neurosurgery

## 2017-07-08 DIAGNOSIS — Z79899 Other long term (current) drug therapy: Secondary | ICD-10-CM | POA: Diagnosis not present

## 2017-07-08 DIAGNOSIS — R600 Localized edema: Secondary | ICD-10-CM | POA: Diagnosis not present

## 2017-07-08 DIAGNOSIS — I10 Essential (primary) hypertension: Secondary | ICD-10-CM | POA: Diagnosis not present

## 2017-07-08 DIAGNOSIS — I878 Other specified disorders of veins: Secondary | ICD-10-CM | POA: Diagnosis not present

## 2017-07-14 DIAGNOSIS — I10 Essential (primary) hypertension: Secondary | ICD-10-CM | POA: Diagnosis not present

## 2017-07-14 DIAGNOSIS — R600 Localized edema: Secondary | ICD-10-CM | POA: Diagnosis not present

## 2017-07-14 DIAGNOSIS — I878 Other specified disorders of veins: Secondary | ICD-10-CM | POA: Diagnosis not present

## 2017-07-27 DIAGNOSIS — F321 Major depressive disorder, single episode, moderate: Secondary | ICD-10-CM | POA: Diagnosis not present

## 2017-07-27 DIAGNOSIS — F411 Generalized anxiety disorder: Secondary | ICD-10-CM | POA: Diagnosis not present

## 2017-07-30 DIAGNOSIS — M7989 Other specified soft tissue disorders: Secondary | ICD-10-CM | POA: Insufficient documentation

## 2017-07-30 DIAGNOSIS — M5136 Other intervertebral disc degeneration, lumbar region: Secondary | ICD-10-CM | POA: Diagnosis not present

## 2017-07-31 ENCOUNTER — Other Ambulatory Visit (HOSPITAL_COMMUNITY): Payer: Self-pay | Admitting: Neurosurgery

## 2017-07-31 ENCOUNTER — Ambulatory Visit (HOSPITAL_COMMUNITY)
Admission: RE | Admit: 2017-07-31 | Discharge: 2017-07-31 | Disposition: A | Discharge status: Home / Self Care | Payer: PPO | Source: Ambulatory Visit | Attending: Vascular Surgery | Admitting: Vascular Surgery

## 2017-07-31 DIAGNOSIS — R600 Localized edema: Secondary | ICD-10-CM | POA: Diagnosis not present

## 2017-08-03 ENCOUNTER — Other Ambulatory Visit: Payer: Self-pay | Admitting: Allergy and Immunology

## 2017-08-10 ENCOUNTER — Other Ambulatory Visit: Payer: Self-pay

## 2017-08-10 MED ORDER — AZITHROMYCIN 250 MG PO TABS: ORAL_TABLET | ORAL | 0 refills | Status: DC

## 2017-08-13 ENCOUNTER — Telehealth (HOSPITAL_COMMUNITY): Payer: Self-pay

## 2017-08-13 DIAGNOSIS — F411 Generalized anxiety disorder: Secondary | ICD-10-CM

## 2017-08-13 NOTE — Telephone Encounter (Signed)
Medication refill request - Fax received from pt's [facility] for a new 90 day order of her Hydroxyzine. Last ordered 05/14/17 and pt. returns 08/20/17.

## 2017-08-13 NOTE — Telephone Encounter (Signed)
Yes that's fine 

## 2017-08-14 ENCOUNTER — Other Ambulatory Visit (HOSPITAL_COMMUNITY): Payer: Self-pay | Admitting: Psychiatry

## 2017-08-14 DIAGNOSIS — F411 Generalized anxiety disorder: Secondary | ICD-10-CM

## 2017-08-17 MED ORDER — HYDROXYZINE HCL 10 MG PO TABS: 10.0000 mg | ORAL_TABLET | Freq: Two times a day (BID) | ORAL | 0 refills | Status: DC | PRN

## 2017-08-17 NOTE — Telephone Encounter (Signed)
A new 90 day order for patient's requested Hydroxyzine 10 mg tablets, 2 a day as needed and as patient reported taking, #180 with no refills e-scribed to patient's [facility] per verbal approval from Dr. Doyne Keel.

## 2017-08-20 ENCOUNTER — Ambulatory Visit (INDEPENDENT_AMBULATORY_CARE_PROVIDER_SITE_OTHER): Payer: PPO | Admitting: Psychiatry

## 2017-08-20 ENCOUNTER — Encounter (HOSPITAL_COMMUNITY): Payer: Self-pay | Admitting: Psychiatry

## 2017-08-20 DIAGNOSIS — M2550 Pain in unspecified joint: Secondary | ICD-10-CM

## 2017-08-20 DIAGNOSIS — Z810 Family history of intellectual disabilities: Secondary | ICD-10-CM

## 2017-08-20 DIAGNOSIS — F411 Generalized anxiety disorder: Secondary | ICD-10-CM | POA: Diagnosis not present

## 2017-08-20 DIAGNOSIS — M549 Dorsalgia, unspecified: Secondary | ICD-10-CM

## 2017-08-20 DIAGNOSIS — R51 Headache: Secondary | ICD-10-CM

## 2017-08-20 DIAGNOSIS — M542 Cervicalgia: Secondary | ICD-10-CM

## 2017-08-20 DIAGNOSIS — F332 Major depressive disorder, recurrent severe without psychotic features: Secondary | ICD-10-CM

## 2017-08-20 DIAGNOSIS — F952 Tourette's disorder: Secondary | ICD-10-CM

## 2017-08-20 MED ORDER — BUPROPION HCL ER (XL) 300 MG PO TB24: ORAL_TABLET | ORAL | 0 refills | Status: DC

## 2017-08-20 MED ORDER — ARIPIPRAZOLE 20 MG PO TABS: 20.0000 mg | ORAL_TABLET | Freq: Every day | ORAL | 0 refills | Status: DC

## 2017-08-20 MED ORDER — HYDROXYZINE HCL 10 MG PO TABS: 10.0000 mg | ORAL_TABLET | Freq: Two times a day (BID) | ORAL | 0 refills | Status: DC | PRN

## 2017-08-20 NOTE — Progress Notes (Signed)
BH MD/PA/NP OP Progress Note  08/20/2017 10:54 AM Penny Porter  MRN:  [id number]  Chief Complaint:  Chief Complaint    Follow-up; Depression; Anxiety     HPI: Pt had back surgery on Nov 16th. She is still recovering. Pt has gained 10 lbs since then and is working with her Psychologist, sport and exercise as to the cause. Pt is walk a little and has a lot pain if she sits in certain positions. Pt feels she should be more stable than she is currently. She gets depressed and frustrated due to her physical limitations. She can't do much. Pt still goes out but is in a lot of pain. Pt denies isolation. Pt's mother died 06/26/23. Pt is handling it better than she thought she would. There has been a lot going on. Pt denies crying spells, hopelessness. Sleep is good.  Pt denies SI/HI/AVH.  Anxiety is "ok for the most part". She notices it more when she is out and trying to move around a lot. At that time she feels restless and wants to get out and go home. Pt is taking Vistaril BID and it helps to calm her down.  Tourette's- at times her arms will jerk when she is nervous. Her stomach will always jerk which has been a constant for her.   Pt states-taking meds as prescribed and denies SE.   Visit Diagnosis:    ICD-10-CM   1. Major depressive disorder, recurrent, severe without psychotic features ([place]) F33.2 ARIPiprazole (ABILIFY) 20 MG tablet    buPROPion (WELLBUTRIN XL) 300 MG 24 hr tablet  2. Tourette's disease F95.2 ARIPiprazole (ABILIFY) 20 MG tablet  3. Anxiety state F41.1 hydrOXYzine (ATARAX/VISTARIL) 10 MG tablet       Past Psychiatric History:  Anxiety: No Bipolar Disorder: No Depression: Yes Mania: No Psychosis: No Schizophrenia: No Personality Disorder: No Hospitalization for psychiatric illness: No History of Electroconvulsive Shock Therapy: No Prior Suicide Attempts: No   Past Medical History:  Past Medical History:  Diagnosis Date  . Anxiety   . Arthritis    Back and hip  . Asthma   . Bursitis    . Complication of anesthesia    bladder hard to wake up, never needed a in and out cath  . Costochondral chest pain   . Degenerative disk disease   . Depression   . Gastroparesis   . GERD (gastroesophageal reflux disease)   . Hypertension   . Lumbar herniated disc   . Sleep apnea    not using CPAP  . Stenosis of lumbosacral spine   . Thyroid nodule   . Tourette disease   . Trigeminal nerve disease     Past Surgical History:  Procedure Laterality Date  . BACK SURGERY  September 19, 2015  . bone spur removal on back    . CHOLECYSTECTOMY  2005  . COLONOSCOPY    . ELBOW SURGERY Left May 29, 2014  . ESOPHAGOGASTRODUODENOSCOPY    . KNEE SURGERY  06/2016  . polyp removal    . POSTERIOR FUSION LUMBAR SPINE  06/26/2017  . ruptured disk  2008  . TEAR DUCT PROBING  2009    Family Psychiatric History:  Family History  Problem Relation Age of Onset  . Mental retardation Sister   . Dementia Father   . Suicidality Neg Hx     Social History:  Social History   Socioeconomic History  . Marital status: Single    Spouse name: None  . Number of children: None  .  Years of education: None  . Highest education level: None  Social Needs  . Financial resource strain: None  . Food insecurity - worry: None  . Food insecurity - inability: None  . Transportation needs - medical: None  . Transportation needs - non-medical: None  Occupational History  . None  Tobacco Use  . Smoking status: Never Smoker  . Smokeless tobacco: Never Used  Substance and Sexual Activity  . Alcohol use: No  . Drug use: No  . Sexual activity: Not Currently  Other Topics Concern  . None  Social History Narrative  . None    Allergies:  Allergies  Allergen Reactions  . Amoxicillin Hives and Swelling    LIP SWELLING PATIENT HAS HAD A PCN REACTION WITH IMMEDIATE RASH, FACIAL/TONGUE/THROAT SWELLING, SOB, OR LIGHTHEADEDNESS WITH HYPOTENSION:  #  #  #  YES  #  #  #   HAS PT DEVELOPED SEVERE RASH  INVOLVING MUCUS MEMBRANES or SKIN NECROSIS: #  #  #  YES  #  #  #  Has patient had a PCN reaction that required hospitalization: No Has patient had a PCN reaction occurring within the last 10 years: Unknown If all of the above answers are "NO", then may proceed with Cephalosporin use.   . Avelox [Moxifloxacin Hcl In Nacl] Other (See Comments)    Caused pain in shoulders to finger, tendonitis  . Moxifloxacin Other (See Comments)    Caused pain in shoulders to finger, tendonitis  . Adhesive [Tape] Dermatitis    Blisters skin  . Bextra [Valdecoxib] Hives and Swelling    SWELLING REACTION UNSPECIFIED   . Doxycycline Hyclate Hives and Swelling    SWELLING REACTION UNSPECIFIED   . Gadobutrol Hives  . Gadolinium Derivatives Hives  . Gadoversetamide Hives  . Iodinated Diagnostic Agents Hives  . Lamictal [Lamotrigine] Hives  . Naproxen Hypertension  . Percolone [Oxycodone] Hives  . Relafen [Nabumetone] Hypertension  . Risperdal [Risperidone] Other (See Comments)    Leg weakness   . Fetzima [Levomilnacipran] Other (See Comments)    Tremors across head   . Nitrofurantoin Other (See Comments)    UNSPECIFIED REACTION   . Brintellix [Vortioxetine] Nausea And Vomiting    Metabolic Disorder Labs: No results found for: HGBA1C, MPG Lab Results  Component Value Date   PROLACTIN 13.1 06/09/2017   No results found for: CHOL, TRIG, HDL, CHOLHDL, VLDL, LDLCALC No results found for: TSH  Therapeutic Level Labs: No results found for: LITHIUM No results found for: VALPROATE No components found for:  CBMZ  Current Medications: Current Outpatient Medications  Medication Sig Dispense Refill  . Acetaminophen (TYLENOL PO) Take 1 tablet by mouth daily as needed (PAIN).    Marland Kitchen albuterol (PROAIR HFA) 108 (90 Base) MCG/ACT inhaler Inhale two puffs every four to six hours as needed for cough or wheeze. (Patient taking differently: Inhale 2 puffs into the lungs every 4 (four) hours as needed for  wheezing or shortness of breath. Inhale two puffs every four to six hours as needed for cough or wheeze.) 3 Inhaler 0  . albuterol (PROVENTIL) (2.5 MG/3ML) 0.083% nebulizer solution PLACE 1 VIAL IN NEBULIZER EVERY 6 HOURS AS NEEDED FOR WHEEZING UP TO 30 DAYS  5  . ARIPiprazole (ABILIFY) 20 MG tablet Take 1 tablet (20 mg total) by mouth daily. 90 tablet 0  . aspirin 81 MG tablet Take 81 mg by mouth daily.    Marland Kitchen azithromycin (ZITHROMAX) 250 MG tablet TAKE ONE TABLET 3 TIMES WEEKLY  12 tablet 0  . BENICAR 40 MG tablet Take 20 mg by mouth daily at 12 noon.   4  . buPROPion (WELLBUTRIN XL) 300 MG 24 hr tablet TAKE 1 TABLET (300 MG TOTAL) BY MOUTH EVERY MORNING. 90 tablet 0  . carvedilol (COREG) 3.125 MG tablet Take 1.5625 mg by mouth 2 (two) times daily with a meal. Takes 0.5 tablet twice daily    . Cholecalciferol (VITAMIN D-3) 5000 UNITS TABS Take 5,000 Units by mouth daily.     . clotrimazole-betamethasone (LOTRISONE) cream APPLY SPARINGLY TO THE AFFECTED AREA(S) TWICE DAILY AS NEEDED FOR IRRITATION  5  . furosemide (LASIX) 20 MG tablet Take 20 mg by mouth daily. Take 1-2 tabs po up to twice a day as needed for edema     . hydrOXYzine (ATARAX/VISTARIL) 10 MG tablet Take 1 tablet (10 mg total) by mouth 2 (two) times daily as needed. 180 tablet 0  . ketoconazole (NIZORAL) 2 % shampoo Apply 1 application topically as needed for irritation.     Marland Kitchen loratadine (CLARITIN) 10 MG tablet Take 10 mg by mouth at bedtime.     Marland Kitchen lubiprostone (AMITIZA) 24 MCG capsule Take 24 mcg by mouth daily as needed for constipation.     . meloxicam (MOBIC) 7.5 MG tablet Take 7.5 mg by mouth 2 (two) times daily.  0  . mupirocin ointment (BACTROBAN) 2 % Place 1 application into the nose 2 (two) times daily as needed (IRRITATION).     Marland Kitchen ondansetron (ZOFRAN-ODT) 4 MG disintegrating tablet Take 4 mg by mouth 2 (two) times daily as needed for nausea or vomiting.    . pregabalin (LYRICA) 75 MG capsule Take 75 mg by mouth 2 (two) times  daily. Reported on 10/09/2015    . QVAR 80 MCG/ACT inhaler USE 1 PUFF DAILY TO PREVENT COUGH/WHEEZE-RINSE,GARGLE & SPIT AFTER USE-INCREASE TO 3 PUFFS W/FLAREUP (Patient taking differently: USE 1 PUFF DAILY AS NEEDED FOR COUGH/WHEEZE-RINSE,GARGLE & SPIT AFTER USE-INCREASE TO 3 PUFFS W/FLAREUP) 8.7 g 5  . RABEprazole (ACIPHEX) 20 MG tablet Take 20 mg by mouth daily.    . ranitidine (ZANTAC) 300 MG tablet Take 300 mg by mouth at bedtime.    Marland Kitchen spironolactone (ALDACTONE) 25 MG tablet Take 25 mg by mouth daily.    Marland Kitchen triamcinolone cream (KENALOG) 0.1 % Apply 1 application topically 2 (two) times daily as needed (IRRITATION).     Marland Kitchen triamcinolone ointment (KENALOG) 0.5 % Apply 1 application topically 2 (two) times daily as needed (IRRITATION).     Marland Kitchen vitamin B-12 (CYANOCOBALAMIN) 1000 MCG tablet Take 1,000 mcg by mouth daily.     No current facility-administered medications for this visit.      Musculoskeletal: Strength & Muscle Tone: within normal limits Gait & Station: slow with cane due back surgery Nov 16 Patient leans: N/A  Psychiatric Specialty Exam: Review of Systems  Musculoskeletal: Positive for back pain, joint pain and neck pain. Negative for falls.  Neurological: Positive for headaches. Negative for tingling, tremors and sensory change.    Blood pressure 140/80, pulse (!) 101, height 5' 9.5" (1.765 m), weight 268 lb 9.6 oz (121.8 kg).Body mass index is 39.1 kg/m.  General Appearance: Casual  Eye Contact:  Good  Speech:  Clear and Coherent and Normal Rate  Volume:  Normal  Mood:  Depressed  Affect:  Full Range  Thought Process:  Coherent and Descriptions of Associations: Circumstantial  Orientation:  Full (Time, Place, and Person)  Thought Content: Rumination   Suicidal  Thoughts:  No  Homicidal Thoughts:  No  Memory:  Immediate;   Good Recent;   Good Remote;   Good  Judgement:  Good  Insight:  Good  Psychomotor Activity:  Normal  Concentration:  Concentration: Good and  Attention Span: Good  Recall:  Good  Fund of Knowledge: Good  Language: Good  Akathisia:  No  Handed:  Right  AIMS (if indicated): not done  Assets:  Communication Skills Desire for Improvement Housing Leisure Time Social Support  ADL's:  Intact  Cognition: WNL  Sleep:  Good   Screenings: PHQ2-9     Patient Outreach Telephone from 06/02/2017 in [facility] Patient Outreach Telephone from 03/10/2017 in [facility]  PHQ-2 Total Score  0  0       Assessment and Plan: MDD-recurrent, moderate- worsening due to physical health; Tourette's syndrome- stable; anxiety disorder-unspecified- stable    Medication management with supportive therapy. Risks/benefits and SE of the medication discussed. Pt verbalized understanding and verbal consent obtained for treatment.  Affirm with the patient that the medications are taken as ordered. Patient expressed understanding of how their medications were to be used.   Meds: Abilify 20 mg p.o. daily for MDD and Tourette's Wellbutrin XL 300 mg p.o. daily for MDD.  Higher doses caused hypertension Vistaril 10 mg p.o. 3 times daily as needed anxiety symptoms  Labs: reviewed labs with pt done on 06/18/17- CBC WNL, BMP WNL, Prolactin level- 13.1    Therapy: brief supportive therapy provided. Discussed psychosocial stressors in detail.     Consultations: Encouraged to follow up with therapist Encouraged to follow up with PCP as needed  Pt denies SI and is at an acute low risk for suicide. Patient told to call clinic if any problems occur. Patient advised to go to ER if they should develop SI/HI, side effects, or if symptoms worsen. Has crisis numbers to call if needed. Pt verbalized understanding.  F/up in 3 months or sooner if needed    Charlcie Cradle, MD 08/20/2017, 10:54 AM

## 2017-08-20 NOTE — Patient Instructions (Signed)
1. EKG to monitor QTc  2. Need HbA1c and Lipid panel

## 2017-08-24 DIAGNOSIS — I878 Other specified disorders of veins: Secondary | ICD-10-CM | POA: Diagnosis not present

## 2017-08-24 DIAGNOSIS — K3184 Gastroparesis: Secondary | ICD-10-CM | POA: Diagnosis not present

## 2017-08-24 DIAGNOSIS — I10 Essential (primary) hypertension: Secondary | ICD-10-CM | POA: Diagnosis not present

## 2017-08-24 DIAGNOSIS — J4540 Moderate persistent asthma, uncomplicated: Secondary | ICD-10-CM | POA: Diagnosis not present

## 2017-08-24 DIAGNOSIS — R9431 Abnormal electrocardiogram [ECG] [EKG]: Secondary | ICD-10-CM | POA: Diagnosis not present

## 2017-08-25 DIAGNOSIS — I10 Essential (primary) hypertension: Secondary | ICD-10-CM | POA: Diagnosis not present

## 2017-08-25 DIAGNOSIS — F332 Major depressive disorder, recurrent severe without psychotic features: Secondary | ICD-10-CM | POA: Diagnosis not present

## 2017-08-25 DIAGNOSIS — E782 Mixed hyperlipidemia: Secondary | ICD-10-CM | POA: Diagnosis not present

## 2017-08-27 ENCOUNTER — Ambulatory Visit (INDEPENDENT_AMBULATORY_CARE_PROVIDER_SITE_OTHER): Payer: PPO

## 2017-08-27 ENCOUNTER — Ambulatory Visit (INDEPENDENT_AMBULATORY_CARE_PROVIDER_SITE_OTHER): Payer: PPO | Admitting: Sports Medicine

## 2017-08-27 ENCOUNTER — Encounter: Payer: Self-pay | Admitting: Sports Medicine

## 2017-08-27 VITALS — BP 126/82 | HR 93 | Ht 69.0 in | Wt 265.0 lb

## 2017-08-27 DIAGNOSIS — M2042 Other hammer toe(s) (acquired), left foot: Secondary | ICD-10-CM | POA: Diagnosis not present

## 2017-08-27 DIAGNOSIS — I739 Peripheral vascular disease, unspecified: Secondary | ICD-10-CM

## 2017-08-27 DIAGNOSIS — F952 Tourette's disorder: Secondary | ICD-10-CM | POA: Diagnosis not present

## 2017-08-27 DIAGNOSIS — F332 Major depressive disorder, recurrent severe without psychotic features: Secondary | ICD-10-CM

## 2017-08-27 DIAGNOSIS — M5126 Other intervertebral disc displacement, lumbar region: Secondary | ICD-10-CM

## 2017-08-27 NOTE — Progress Notes (Signed)
Subjective: Penny Porter is a 52 y.o. female patient who presents to office for evaluation of  Left foot pain. Patient complains of progressive pain especially over the last month states that there is a awkward sensation to her second and third toes on the left and also states that she cannot wear shoe without putting a Band-Aid over the second toe because of irritation patient is status post back surgery 06/26/2017.  Patient states that she had a fusion procedure done and is slowly healing patient also admits that she has a history of vascular disease currently using compression stockings however with difficulty due to irritation to toes.  Patient is assisted by her sister today who is helping to care for her since she is recovering from back surgery. Patient denies any other pedal complaints.   Review of Systems  Musculoskeletal: Positive for back pain and joint pain.  All other systems reviewed and are negative.   Patient Active Problem List   Diagnosis Date Noted  . HNP (herniated nucleus pulposus), lumbar 06/26/2017  . Tourette's disease 11/14/2014  . Major depressive disorder, recurrent, severe without psychotic features ([place]) 07/11/2014  . Depression 11/29/2013  . Tourette disorder 11/29/2013    Current Outpatient Medications on File Prior to Visit  Medication Sig Dispense Refill  . Acetaminophen (TYLENOL PO) Take 1 tablet by mouth daily as needed (PAIN).    Marland Kitchen albuterol (PROAIR HFA) 108 (90 Base) MCG/ACT inhaler Inhale two puffs every four to six hours as needed for cough or wheeze. (Patient taking differently: Inhale 2 puffs into the lungs every 4 (four) hours as needed for wheezing or shortness of breath. Inhale two puffs every four to six hours as needed for cough or wheeze.) 3 Inhaler 0  . albuterol (PROVENTIL) (2.5 MG/3ML) 0.083% nebulizer solution PLACE 1 VIAL IN NEBULIZER EVERY 6 HOURS AS NEEDED FOR WHEEZING UP TO 30 DAYS  5  . allopurinol (ZYLOPRIM) 100 MG tablet Take by mouth.     . ARIPiprazole (ABILIFY) 20 MG tablet Take 1 tablet (20 mg total) by mouth daily. 90 tablet 0  . aspirin 81 MG tablet Take 81 mg by mouth daily.    Marland Kitchen azithromycin (ZITHROMAX) 250 MG tablet TAKE ONE TABLET 3 TIMES WEEKLY 12 tablet 0  . BENICAR 40 MG tablet Take 20 mg by mouth daily at 12 noon.   4  . buPROPion (WELLBUTRIN XL) 300 MG 24 hr tablet TAKE 1 TABLET (300 MG TOTAL) BY MOUTH EVERY MORNING. 90 tablet 0  . carvedilol (COREG) 3.125 MG tablet Take 1.5625 mg by mouth 2 (two) times daily with a meal. Takes 0.5 tablet twice daily    . Cholecalciferol (VITAMIN D-3) 5000 UNITS TABS Take 5,000 Units by mouth daily.     . clotrimazole-betamethasone (LOTRISONE) cream APPLY SPARINGLY TO THE AFFECTED AREA(S) TWICE DAILY AS NEEDED FOR IRRITATION  5  . furosemide (LASIX) 20 MG tablet Take 20 mg by mouth daily. Take 1-2 tabs po up to twice a day as needed for edema     . hydrOXYzine (ATARAX/VISTARIL) 10 MG tablet Take 1 tablet (10 mg total) by mouth 2 (two) times daily as needed. 180 tablet 0  . ketoconazole (NIZORAL) 2 % shampoo Apply 1 application topically as needed for irritation.     Marland Kitchen loratadine (CLARITIN) 10 MG tablet Take 10 mg by mouth at bedtime.     Marland Kitchen lubiprostone (AMITIZA) 24 MCG capsule Take 24 mcg by mouth daily as needed for constipation.     Marland Kitchen  meloxicam (MOBIC) 7.5 MG tablet Take 7.5 mg by mouth 2 (two) times daily.  0  . mupirocin ointment (BACTROBAN) 2 % Place 1 application into the nose 2 (two) times daily as needed (IRRITATION).     Marland Kitchen ondansetron (ZOFRAN-ODT) 4 MG disintegrating tablet Take 4 mg by mouth 2 (two) times daily as needed for nausea or vomiting.    . pregabalin (LYRICA) 75 MG capsule Take 75 mg by mouth 2 (two) times daily. Reported on 10/09/2015    . QVAR 80 MCG/ACT inhaler USE 1 PUFF DAILY TO PREVENT COUGH/WHEEZE-RINSE,GARGLE & SPIT AFTER USE-INCREASE TO 3 PUFFS W/FLAREUP (Patient taking differently: USE 1 PUFF DAILY AS NEEDED FOR COUGH/WHEEZE-RINSE,GARGLE & SPIT AFTER  USE-INCREASE TO 3 PUFFS W/FLAREUP) 8.7 g 5  . RABEprazole (ACIPHEX) 20 MG tablet Take 20 mg by mouth daily.    . ranitidine (ZANTAC) 300 MG tablet Take 300 mg by mouth at bedtime.    Marland Kitchen spironolactone (ALDACTONE) 25 MG tablet Take 25 mg by mouth daily.    . traMADol (ULTRAM) 50 MG tablet Take 50 mg by mouth.    . triamcinolone cream (KENALOG) 0.1 % Apply 1 application topically 2 (two) times daily as needed (IRRITATION).     Marland Kitchen triamcinolone ointment (KENALOG) 0.5 % Apply 1 application topically 2 (two) times daily as needed (IRRITATION).     Marland Kitchen vitamin B-12 (CYANOCOBALAMIN) 1000 MCG tablet Take 1,000 mcg by mouth daily.    . Vitamin D, Ergocalciferol, (DRISDOL) 50000 units CAPS capsule Take by mouth.     No current facility-administered medications on file prior to visit.     Allergies  Allergen Reactions  . Amoxicillin Hives and Swelling    LIP SWELLING PATIENT HAS HAD A PCN REACTION WITH IMMEDIATE RASH, FACIAL/TONGUE/THROAT SWELLING, SOB, OR LIGHTHEADEDNESS WITH HYPOTENSION:  #  #  #  YES  #  #  #   HAS PT DEVELOPED SEVERE RASH INVOLVING MUCUS MEMBRANES or SKIN NECROSIS: #  #  #  YES  #  #  #  Has patient had a PCN reaction that required hospitalization: No Has patient had a PCN reaction occurring within the last 10 years: Unknown If all of the above answers are "NO", then may proceed with Cephalosporin use.   . Avelox [Moxifloxacin Hcl In Nacl] Other (See Comments)    Caused pain in shoulders to finger, tendonitis  . Moxifloxacin Other (See Comments)    Caused pain in shoulders to finger, tendonitis  . Adhesive [Tape] Dermatitis    Blisters skin  . Bextra [Valdecoxib] Hives and Swelling    SWELLING REACTION UNSPECIFIED   . Doxycycline Hyclate Hives and Swelling    SWELLING REACTION UNSPECIFIED   . Gadobutrol Hives  . Gadolinium Derivatives Hives  . Gadoversetamide Hives  . Iodinated Diagnostic Agents Hives  . Lamictal [Lamotrigine] Hives  . Naproxen Hypertension  . Percolone  [Oxycodone] Hives  . Relafen [Nabumetone] Hypertension  . Risperdal [Risperidone] Other (See Comments)    Leg weakness   . Fetzima [Levomilnacipran] Other (See Comments)    Tremors across head   . Nitrofurantoin Other (See Comments)    UNSPECIFIED REACTION   . Brintellix [Vortioxetine] Nausea And Vomiting    Objective:  General: Alert and oriented x3 in no acute distress  Dermatology: No hyperkeratotic lesions overlying 2-5 PIPJ dorsally bilateral. No open lesions bilateral lower extremities, no webspace macerations, no ecchymosis bilateral, all nails x 10 are well manicured.  Vascular: Dorsalis Pedis and Posterior Tibial pedal pulses 1/4 very  faint, Capillary Fill Time 5 seconds, scant pedal hair growth bilateral, trace edema bilateral lower extremities, Temperature gradient mildly decreased with venous stasis hyperpigmentation.  Neurology: Johney Maine sensation intact via light touch bilateral, subjective subjective numbness to left second and third toes  Musculoskeletal: Semi-flexible hammertoes 2-5 with the second and third toes most involved with minimal  tenderness with palpation at PIPJ left greater than right, No bunion deformity noted bilateral. No pain with calf compression bilateral.  Strength within normal limits in all groups bilateral for postop status however there is some mild weakness could be secondary to guarding because currently patient is in a back brace.    Xrays  Left Foot    Impression: Normal osseous mineralization there is mild digital contracture otherwise no other acute findings soft tissues within normal limits.       Assessment and Plan: Problem List Items Addressed This Visit      Nervous and Auditory   Tourette's disease     Musculoskeletal and Integument   HNP (herniated nucleus pulposus), lumbar     Other   Major depressive disorder, recurrent, severe without psychotic features ([place])    Other Visit Diagnoses    Hammertoe of left foot    -   Primary   Relevant Orders   DG Foot Complete Left   PVD (peripheral vascular disease) ([place])           -Complete examination performed -Xrays reviewed -Discussed treatement options for hammertoes -Dispense toe crest pad and advised patient wide shoes that are supportive rubbing or irritation toes also advised patient to consider open toe compression garments to refrain from excessive irritation to toes -Advised patient to allow time to recover from back surgery and once she has recovered from this and possible change with her mobility then if her toes are still painful to revisit office to discuss further treatment options.  Advised patient that she is not a surgery candidate at this time until her back is completely recovered. -Patient to return to office as needed or sooner if condition worsens.  Landis Martins, DPM

## 2017-09-02 DIAGNOSIS — R609 Edema, unspecified: Secondary | ICD-10-CM | POA: Diagnosis not present

## 2017-09-02 DIAGNOSIS — G4733 Obstructive sleep apnea (adult) (pediatric): Secondary | ICD-10-CM | POA: Diagnosis not present

## 2017-09-02 DIAGNOSIS — I10 Essential (primary) hypertension: Secondary | ICD-10-CM | POA: Diagnosis not present

## 2017-09-02 DIAGNOSIS — R000 Tachycardia, unspecified: Secondary | ICD-10-CM | POA: Diagnosis not present

## 2017-09-03 ENCOUNTER — Other Ambulatory Visit: Payer: Self-pay

## 2017-09-03 ENCOUNTER — Encounter: Payer: Self-pay | Admitting: Allergy and Immunology

## 2017-09-03 ENCOUNTER — Ambulatory Visit (INDEPENDENT_AMBULATORY_CARE_PROVIDER_SITE_OTHER): Payer: PPO | Admitting: Allergy and Immunology

## 2017-09-03 VITALS — BP 118/70 | HR 88 | Resp 24

## 2017-09-03 DIAGNOSIS — J3089 Other allergic rhinitis: Secondary | ICD-10-CM

## 2017-09-03 DIAGNOSIS — J4520 Mild intermittent asthma, uncomplicated: Secondary | ICD-10-CM | POA: Diagnosis not present

## 2017-09-03 DIAGNOSIS — K219 Gastro-esophageal reflux disease without esophagitis: Secondary | ICD-10-CM | POA: Diagnosis not present

## 2017-09-03 MED ORDER — ALBUTEROL SULFATE HFA 108 (90 BASE) MCG/ACT IN AERS: 2.0000 | INHALATION_SPRAY | RESPIRATORY_TRACT | 0 refills | Status: DC | PRN

## 2017-09-03 MED ORDER — ALBUTEROL SULFATE HFA 108 (90 BASE) MCG/ACT IN AERS: INHALATION_SPRAY | RESPIRATORY_TRACT | 0 refills | Status: DC

## 2017-09-03 NOTE — Progress Notes (Signed)
Follow-up Note  Referring Provider: Raina Mina., MD Primary Provider: Raina Mina., MD Date of Office Visit: 09/03/2017  Subjective:   Penny Porter (DOB: [birth year]) is a 52 y.o. female who returns to the [facility] on 09/03/2017 in re-evaluation of the following:  HPI: Penny Porter returns to this clinic in evaluation of asthma and allergic rhinitis and LPR.  Her last visit to this clinic was 21 June 2017.  Overall she has done very well with her asthma and has only had to activate her action plan twice for 2 days per episode.  She has not required a systemic steroid to treat an exacerbation of asthma.  She does not exercise to any extent as she just recently had back surgery.  She has had very little issues with her upper airways although she does get intermittently stuffy.  She is not using any nasal steroid at this point in time.  As long as she remains on a proton pump inhibitor and an H2 receptor blocker she does quite well with her reflux and her throat.  She has received the flu vaccine this year.  Allergies as of 09/03/2017      Reactions   Amoxicillin Hives, Swelling   LIP SWELLING PATIENT HAS HAD A PCN REACTION WITH IMMEDIATE RASH, FACIAL/TONGUE/THROAT SWELLING, SOB, OR LIGHTHEADEDNESS WITH HYPOTENSION:  #  #  #  YES  #  #  #   HAS PT DEVELOPED SEVERE RASH INVOLVING MUCUS MEMBRANES or SKIN NECROSIS: #  #  #  YES  #  #  #  Has patient had a PCN reaction that required hospitalization: No Has patient had a PCN reaction occurring within the last 10 years: Unknown If all of the above answers are "NO", then may proceed with Cephalosporin use.   Avelox [moxifloxacin Hcl In Nacl] Other (See Comments)   Caused pain in shoulders to finger, tendonitis   Moxifloxacin Other (See Comments)   Caused pain in shoulders to finger, tendonitis   Adhesive [tape] Dermatitis   Blisters skin   Bextra [valdecoxib] Hives, Swelling   SWELLING REACTION UNSPECIFIED    Doxycycline Hyclate Hives, Swelling   SWELLING REACTION UNSPECIFIED    Gadobutrol Hives   Gadolinium Derivatives Hives   Gadoversetamide Hives   Iodinated Diagnostic Agents Hives   Lamictal [lamotrigine] Hives   Naproxen Hypertension   Percolone [oxycodone] Hives   Relafen [nabumetone] Hypertension   Risperdal [risperidone] Other (See Comments)   Leg weakness   Fetzima [levomilnacipran] Other (See Comments)   Tremors across head   Nitrofurantoin Other (See Comments)   UNSPECIFIED REACTION    Brintellix [vortioxetine] Nausea And Vomiting      Medication List      albuterol 108 (90 Base) MCG/ACT inhaler Commonly known as:  PROAIR HFA Inhale two puffs every four to six hours as needed for cough or wheeze.   albuterol (2.5 MG/3ML) 0.083% nebulizer solution Commonly known as:  PROVENTIL PLACE 1 VIAL IN NEBULIZER EVERY 6 HOURS AS NEEDED FOR WHEEZING UP TO 30 DAYS   allopurinol 100 MG tablet Commonly known as:  ZYLOPRIM Take by mouth.   ARIPiprazole 20 MG tablet Commonly known as:  ABILIFY Take 1 tablet (20 mg total) by mouth daily.   aspirin 81 MG tablet Take 81 mg by mouth daily.   azithromycin 250 MG tablet Commonly known as:  ZITHROMAX TAKE ONE TABLET 3 TIMES WEEKLY   BENICAR 40 MG tablet Generic drug:  olmesartan Take 20 mg  by mouth daily at 12 noon.   buPROPion 300 MG 24 hr tablet Commonly known as:  WELLBUTRIN XL TAKE 1 TABLET (300 MG TOTAL) BY MOUTH EVERY MORNING.   clotrimazole-betamethasone cream Commonly known as:  LOTRISONE APPLY SPARINGLY TO THE AFFECTED AREA(S) TWICE DAILY AS NEEDED FOR IRRITATION   furosemide 20 MG tablet Commonly known as:  LASIX Take 20 mg by mouth daily. Take 1-2 tabs po up to twice a day as needed for edema   hydrOXYzine 10 MG tablet Commonly known as:  ATARAX/VISTARIL Take 1 tablet (10 mg total) by mouth 2 (two) times daily as needed.   ketoconazole 2 % shampoo Commonly known as:  NIZORAL Apply 1 application topically as  needed for irritation.   loratadine 10 MG tablet Commonly known as:  CLARITIN Take 10 mg by mouth at bedtime.   lubiprostone 24 MCG capsule Commonly known as:  AMITIZA Take 24 mcg by mouth daily as needed for constipation.   meloxicam 7.5 MG tablet Commonly known as:  MOBIC Take 7.5 mg by mouth 2 (two) times daily.   metoprolol succinate 25 MG 24 hr tablet Commonly known as:  TOPROL-XL Take by mouth.   mupirocin ointment 2 % Commonly known as:  BACTROBAN Place 1 application into the nose 2 (two) times daily as needed (IRRITATION).   ondansetron 4 MG disintegrating tablet Commonly known as:  ZOFRAN-ODT Take 4 mg by mouth 2 (two) times daily as needed for nausea or vomiting.   pregabalin 75 MG capsule Commonly known as:  LYRICA Take 75 mg by mouth 2 (two) times daily. Reported on 10/09/2015   QVAR 80 MCG/ACT inhaler Generic drug:  beclomethasone USE 1 PUFF DAILY TO PREVENT COUGH/WHEEZE-RINSE,GARGLE & SPIT AFTER USE-INCREASE TO 3 PUFFS W/FLAREUP   RABEprazole 20 MG tablet Commonly known as:  ACIPHEX Take 20 mg by mouth daily.   ranitidine 300 MG tablet Commonly known as:  ZANTAC Take 300 mg by mouth at bedtime.   spironolactone 25 MG tablet Commonly known as:  ALDACTONE Take 25 mg by mouth daily.   traMADol 50 MG tablet Commonly known as:  ULTRAM Take 50 mg by mouth.   triamcinolone cream 0.1 % Commonly known as:  KENALOG Apply 1 application topically 2 (two) times daily as needed (IRRITATION).   triamcinolone ointment 0.5 % Commonly known as:  KENALOG Apply 1 application topically 2 (two) times daily as needed (IRRITATION).   TYLENOL PO Take 1 tablet by mouth daily as needed (PAIN).   vitamin B-12 1000 MCG tablet Commonly known as:  CYANOCOBALAMIN Take 1,000 mcg by mouth daily.   Vitamin D (Ergocalciferol) 50000 units Caps capsule Commonly known as:  DRISDOL Take by mouth.   Vitamin D-3 5000 units Tabs Take 5,000 Units by mouth daily.       Past  Medical History:  Diagnosis Date  . Anxiety   . Arthritis    Back and hip  . Asthma   . Bursitis   . Complication of anesthesia    bladder hard to wake up, never needed a in and out cath  . Costochondral chest pain   . Degenerative disk disease   . Depression   . Gastroparesis   . GERD (gastroesophageal reflux disease)   . Hypertension   . Lumbar herniated disc   . Sleep apnea    not using CPAP  . Stenosis of lumbosacral spine   . Thyroid nodule   . Tourette disease   . Trigeminal nerve disease  Past Surgical History:  Procedure Laterality Date  . BACK SURGERY  September 19, 2015  . bone spur removal on back    . CHOLECYSTECTOMY  2005  . COLONOSCOPY    . ELBOW SURGERY Left May 29, 2014  . ESOPHAGOGASTRODUODENOSCOPY    . KNEE SURGERY  06/2016  . polyp removal    . POSTERIOR FUSION LUMBAR SPINE  06/26/2017  . ruptured disk  2008  . TEAR DUCT PROBING  2009    Review of systems negative except as noted in HPI / PMHx or noted below:  Review of Systems  Constitutional: Negative.   HENT: Negative.   Eyes: Negative.   Respiratory: Negative.   Cardiovascular: Negative.   Gastrointestinal: Negative.   Genitourinary: Negative.   Musculoskeletal: Negative.   Skin: Negative.   Neurological: Negative.   Endo/Heme/Allergies: Negative.   Psychiatric/Behavioral: Negative.      Objective:   Vitals:   09/03/17 1459  BP: 118/70  Pulse: 88  Resp: (!) 24          Physical Exam  Constitutional: She is well-developed, well-nourished, and in no distress.  HENT:  Head: Normocephalic.  Right Ear: Tympanic membrane, external ear and ear canal normal.  Left Ear: Tympanic membrane, external ear and ear canal normal.  Nose: Nose normal. No mucosal edema or rhinorrhea.  Mouth/Throat: Uvula is midline, oropharynx is clear and moist and mucous membranes are normal. No oropharyngeal exudate.  Eyes: Conjunctivae are normal.  Neck: Trachea normal. No tracheal tenderness  present. No tracheal deviation present. No thyromegaly present.  Cardiovascular: Normal rate, regular rhythm, S1 normal, S2 normal and normal heart sounds.  No murmur heard. Pulmonary/Chest: Breath sounds normal. No stridor. No respiratory distress. She has no wheezes. She has no rales.  Musculoskeletal: She exhibits no edema.  Lymphadenopathy:       Head (right side): No tonsillar adenopathy present.       Head (left side): No tonsillar adenopathy present.    She has no cervical adenopathy.  Neurological: She is alert. Gait normal.  Skin: No rash noted. She is not diaphoretic. No erythema. Nails show no clubbing.  Psychiatric: Mood and affect normal.    Diagnostics:    Spirometry was performed and demonstrated an FEV1 of 2.06 at 63 % of predicted.  The patient had an Asthma Control Test with the following results: ACT Total Score: 23.    Assessment and Plan:   1. Asthma, mild intermittent, well-controlled   2. Other allergic rhinitis   3. LPRD (laryngopharyngeal reflux disease)     1. Continue "action plan" including Qvar 80 REDIHALER SAMPLE two inhalation - 3 inhalations 3 times per day for asthma flare  2. Use OTC Nasacort or OTC Rhinocort one spray each nostril 3-7 times per week during upper airway symptoms  3. Continue AcipHex 20 mg in the morning and ranitidine 300 mg in the evening  4. Continue ProAir HFA or similar and antihistamine if needed  5. Return to clinic in 6 months or earlier if problem  Penny Porter appears to be doing very well on her current plan and she will continue on therapy directed against respiratory tract inflammation and therapy directed against reflux as noted above and I will see her back in this clinic in 6 months or earlier if there is a problem.  Allena Katz, MD [facility] [place]

## 2017-09-03 NOTE — Patient Instructions (Addendum)
  1. Continue "action plan" including Qvar 80 REDIHALER SAMPLE two inhalation - 3 inhalations 3 times per day for asthma flare  2. Use OTC Nasacort or OTC Rhinocort one spray each nostril 3-7 times per week during upper airway symptoms  3. Continue AcipHex 20 mg in the morning and ranitidine 300 mg in the evening  4. Continue ProAir HFA or similar and antihistamine if needed  5. Return to clinic in 6 months or earlier if problem

## 2017-09-06 ENCOUNTER — Encounter: Payer: Self-pay | Admitting: Allergy and Immunology

## 2017-09-07 DIAGNOSIS — M1712 Unilateral primary osteoarthritis, left knee: Secondary | ICD-10-CM | POA: Diagnosis not present

## 2017-09-08 ENCOUNTER — Other Ambulatory Visit: Payer: Self-pay | Admitting: Allergy and Immunology

## 2017-09-11 DIAGNOSIS — E049 Nontoxic goiter, unspecified: Secondary | ICD-10-CM | POA: Diagnosis not present

## 2017-09-11 DIAGNOSIS — E042 Nontoxic multinodular goiter: Secondary | ICD-10-CM | POA: Diagnosis not present

## 2017-09-11 DIAGNOSIS — R918 Other nonspecific abnormal finding of lung field: Secondary | ICD-10-CM | POA: Diagnosis not present

## 2017-09-11 DIAGNOSIS — J984 Other disorders of lung: Secondary | ICD-10-CM | POA: Diagnosis not present

## 2017-09-14 DIAGNOSIS — H25813 Combined forms of age-related cataract, bilateral: Secondary | ICD-10-CM | POA: Diagnosis not present

## 2017-09-14 DIAGNOSIS — H524 Presbyopia: Secondary | ICD-10-CM | POA: Diagnosis not present

## 2017-09-15 ENCOUNTER — Other Ambulatory Visit: Payer: Self-pay

## 2017-09-15 DIAGNOSIS — M1712 Unilateral primary osteoarthritis, left knee: Secondary | ICD-10-CM | POA: Diagnosis not present

## 2017-09-15 MED ORDER — AZITHROMYCIN 250 MG PO TABS: ORAL_TABLET | ORAL | 5 refills | Status: DC

## 2017-09-22 DIAGNOSIS — E042 Nontoxic multinodular goiter: Secondary | ICD-10-CM | POA: Diagnosis not present

## 2017-09-23 DIAGNOSIS — M1712 Unilateral primary osteoarthritis, left knee: Secondary | ICD-10-CM | POA: Diagnosis not present

## 2017-09-28 DIAGNOSIS — R0602 Shortness of breath: Secondary | ICD-10-CM | POA: Diagnosis not present

## 2017-09-28 DIAGNOSIS — R609 Edema, unspecified: Secondary | ICD-10-CM | POA: Diagnosis not present

## 2017-10-01 DIAGNOSIS — M5440 Lumbago with sciatica, unspecified side: Secondary | ICD-10-CM | POA: Diagnosis not present

## 2017-10-05 ENCOUNTER — Other Ambulatory Visit: Payer: Self-pay | Admitting: Allergy and Immunology

## 2017-10-05 DIAGNOSIS — F321 Major depressive disorder, single episode, moderate: Secondary | ICD-10-CM | POA: Diagnosis not present

## 2017-10-05 DIAGNOSIS — F411 Generalized anxiety disorder: Secondary | ICD-10-CM | POA: Diagnosis not present

## 2017-10-08 DIAGNOSIS — I499 Cardiac arrhythmia, unspecified: Secondary | ICD-10-CM | POA: Diagnosis not present

## 2017-10-09 DIAGNOSIS — R000 Tachycardia, unspecified: Secondary | ICD-10-CM | POA: Diagnosis not present

## 2017-10-14 DIAGNOSIS — R000 Tachycardia, unspecified: Secondary | ICD-10-CM | POA: Diagnosis not present

## 2017-10-14 DIAGNOSIS — R609 Edema, unspecified: Secondary | ICD-10-CM | POA: Diagnosis not present

## 2017-10-14 DIAGNOSIS — I10 Essential (primary) hypertension: Secondary | ICD-10-CM | POA: Diagnosis not present

## 2017-10-14 DIAGNOSIS — G4733 Obstructive sleep apnea (adult) (pediatric): Secondary | ICD-10-CM | POA: Diagnosis not present

## 2017-10-16 DIAGNOSIS — M7712 Lateral epicondylitis, left elbow: Secondary | ICD-10-CM | POA: Diagnosis not present

## 2017-10-16 DIAGNOSIS — M25522 Pain in left elbow: Secondary | ICD-10-CM | POA: Diagnosis not present

## 2017-10-19 DIAGNOSIS — M1712 Unilateral primary osteoarthritis, left knee: Secondary | ICD-10-CM | POA: Diagnosis not present

## 2017-10-21 DIAGNOSIS — M25522 Pain in left elbow: Secondary | ICD-10-CM | POA: Diagnosis not present

## 2017-10-21 DIAGNOSIS — M7712 Lateral epicondylitis, left elbow: Secondary | ICD-10-CM | POA: Diagnosis not present

## 2017-10-21 DIAGNOSIS — M6281 Muscle weakness (generalized): Secondary | ICD-10-CM | POA: Diagnosis not present

## 2017-10-27 ENCOUNTER — Other Ambulatory Visit: Payer: Self-pay | Admitting: Allergy and Immunology

## 2017-10-27 DIAGNOSIS — I10 Essential (primary) hypertension: Secondary | ICD-10-CM | POA: Diagnosis not present

## 2017-10-27 DIAGNOSIS — J019 Acute sinusitis, unspecified: Secondary | ICD-10-CM | POA: Diagnosis not present

## 2017-10-29 DIAGNOSIS — M25522 Pain in left elbow: Secondary | ICD-10-CM | POA: Diagnosis not present

## 2017-10-29 DIAGNOSIS — M7712 Lateral epicondylitis, left elbow: Secondary | ICD-10-CM | POA: Diagnosis not present

## 2017-10-29 DIAGNOSIS — M6281 Muscle weakness (generalized): Secondary | ICD-10-CM | POA: Diagnosis not present

## 2017-11-02 DIAGNOSIS — I499 Cardiac arrhythmia, unspecified: Secondary | ICD-10-CM | POA: Diagnosis not present

## 2017-11-04 DIAGNOSIS — M7712 Lateral epicondylitis, left elbow: Secondary | ICD-10-CM | POA: Diagnosis not present

## 2017-11-04 DIAGNOSIS — R002 Palpitations: Secondary | ICD-10-CM | POA: Diagnosis not present

## 2017-11-04 DIAGNOSIS — M6281 Muscle weakness (generalized): Secondary | ICD-10-CM | POA: Diagnosis not present

## 2017-11-04 DIAGNOSIS — M25522 Pain in left elbow: Secondary | ICD-10-CM | POA: Diagnosis not present

## 2017-11-13 DIAGNOSIS — M25522 Pain in left elbow: Secondary | ICD-10-CM | POA: Diagnosis not present

## 2017-11-13 DIAGNOSIS — M6281 Muscle weakness (generalized): Secondary | ICD-10-CM | POA: Diagnosis not present

## 2017-11-13 DIAGNOSIS — M7712 Lateral epicondylitis, left elbow: Secondary | ICD-10-CM | POA: Diagnosis not present

## 2017-11-18 ENCOUNTER — Telehealth: Payer: Self-pay

## 2017-11-18 NOTE — Telephone Encounter (Signed)
Patient requesting sample of QVAR 80 Redihaler. LOT# [id number]; EXP 11/19, QTY 1. Placed up at front desk for patient to pick up.

## 2017-11-19 ENCOUNTER — Encounter (HOSPITAL_COMMUNITY): Payer: Self-pay | Admitting: Psychiatry

## 2017-11-19 ENCOUNTER — Ambulatory Visit (HOSPITAL_COMMUNITY): Payer: PPO | Admitting: Psychiatry

## 2017-11-19 DIAGNOSIS — Z818 Family history of other mental and behavioral disorders: Secondary | ICD-10-CM | POA: Diagnosis not present

## 2017-11-19 DIAGNOSIS — F332 Major depressive disorder, recurrent severe without psychotic features: Secondary | ICD-10-CM

## 2017-11-19 DIAGNOSIS — F411 Generalized anxiety disorder: Secondary | ICD-10-CM | POA: Diagnosis not present

## 2017-11-19 DIAGNOSIS — M25522 Pain in left elbow: Secondary | ICD-10-CM | POA: Diagnosis not present

## 2017-11-19 DIAGNOSIS — F952 Tourette's disorder: Secondary | ICD-10-CM

## 2017-11-19 DIAGNOSIS — M7712 Lateral epicondylitis, left elbow: Secondary | ICD-10-CM | POA: Diagnosis not present

## 2017-11-19 DIAGNOSIS — M6281 Muscle weakness (generalized): Secondary | ICD-10-CM | POA: Diagnosis not present

## 2017-11-19 DIAGNOSIS — Z810 Family history of intellectual disabilities: Secondary | ICD-10-CM | POA: Diagnosis not present

## 2017-11-19 MED ORDER — HYDROXYZINE HCL 10 MG PO TABS: 10.0000 mg | ORAL_TABLET | Freq: Two times a day (BID) | ORAL | 0 refills | Status: DC | PRN

## 2017-11-19 MED ORDER — BUPROPION HCL ER (XL) 300 MG PO TB24: ORAL_TABLET | ORAL | 0 refills | Status: DC

## 2017-11-19 MED ORDER — ARIPIPRAZOLE 30 MG PO TABS
30.0000 mg | ORAL_TABLET | Freq: Every day | ORAL | 0 refills | Status: DC
Start: 2017-11-19 — End: 2018-01-21

## 2017-11-19 NOTE — Progress Notes (Signed)
[facility] MD/PA/NP OP Progress Note  11/19/2017 1:33 PM Penny Porter  MRN:  [id number]  Chief Complaint:  Chief Complaint    Follow-up     HPI: " Not good". Pt states she is noticing a new frequent tic where she is touching the skin over her lip. She denies any itching, tingling, anxiety or pain at the time it is happening. She continues to have her usual abdominal tics. She denies any recent med changes. Pt is sleeping 7-9 hrs/night. She is having short term memory problems. Pt does not feel anxious in general but is not sure. She states things at home are ok. Pt is still having pain in her back and that contributes to her depression. Overall her depression is unchanged. Pt denies hopelessness and crying spells. Pt is wearing a heart monitor for irregular heart beat. She will turn it on April 17th and see the results on 24th. Pt states-taking meds as prescribed and denies SE.   Visit Diagnosis:    ICD-10-CM   1. Major depressive disorder, recurrent, severe without psychotic features ([place]) F33.2 ARIPiprazole (ABILIFY) 30 MG tablet    buPROPion (WELLBUTRIN XL) 300 MG 24 hr tablet  2. Tourette's disease F95.2 ARIPiprazole (ABILIFY) 30 MG tablet  3. Anxiety state F41.1 hydrOXYzine (ATARAX/VISTARIL) 10 MG tablet      Past Psychiatric History:  Anxiety:No Bipolar Disorder:No Depression:Yes Mania:No Psychosis:No Schizophrenia:No Personality Disorder:No Hospitalization for psychiatric illness:No History of Electroconvulsive Shock Therapy:No Prior Suicide Attempts:No    Past Medical History:  Past Medical History:  Diagnosis Date  . Anxiety   . Arthritis    Back and hip  . Asthma   . Bursitis   . Complication of anesthesia    bladder hard to wake up, never needed a in and out cath  . Costochondral chest pain   . Degenerative disk disease   . Depression   . Gastroparesis   . GERD (gastroesophageal reflux disease)   . Hypertension   . Lumbar herniated disc   . Sleep apnea     not using CPAP  . Stenosis of lumbosacral spine   . Thyroid nodule   . Tourette disease   . Trigeminal nerve disease     Past Surgical History:  Procedure Laterality Date  . BACK SURGERY  September 19, 2015  . bone spur removal on back    . CHOLECYSTECTOMY  2005  . COLONOSCOPY    . ELBOW SURGERY Left May 29, 2014  . ESOPHAGOGASTRODUODENOSCOPY    . KNEE SURGERY  06/2016  . polyp removal    . POSTERIOR FUSION LUMBAR SPINE  06/26/2017  . ruptured disk  2008  . TEAR DUCT PROBING  2009    Family Psychiatric History:  Family History  Problem Relation Age of Onset  . Mental retardation Sister   . Dementia Father   . Suicidality Neg Hx     Social History:  Social History   Socioeconomic History  . Marital status: Single    Spouse name: Not on file  . Number of children: Not on file  . Years of education: Not on file  . Highest education level: Not on file  Occupational History  . Not on file  Social Needs  . Financial resource strain: Not on file  . Food insecurity:    Worry: Not on file    Inability: Not on file  . Transportation needs:    Medical: Not on file    Non-medical: Not on file  Tobacco Use  .  Smoking status: Never Smoker  . Smokeless tobacco: Never Used  Substance and Sexual Activity  . Alcohol use: No  . Drug use: No  . Sexual activity: Not Currently  Lifestyle  . Physical activity:    Days per week: Not on file    Minutes per session: Not on file  . Stress: Not on file  Relationships  . Social connections:    Talks on phone: Not on file    Gets together: Not on file    Attends religious service: Not on file    Active member of club or organization: Not on file    Attends meetings of clubs or organizations: Not on file    Relationship status: Not on file  Other Topics Concern  . Not on file  Social History Narrative  . Not on file    Allergies:  Allergies  Allergen Reactions  . Amoxicillin Hives and Swelling    LIP SWELLING PATIENT  HAS HAD A PCN REACTION WITH IMMEDIATE RASH, FACIAL/TONGUE/THROAT SWELLING, SOB, OR LIGHTHEADEDNESS WITH HYPOTENSION:  #  #  #  YES  #  #  #   HAS PT DEVELOPED SEVERE RASH INVOLVING MUCUS MEMBRANES or SKIN NECROSIS: #  #  #  YES  #  #  #  Has patient had a PCN reaction that required hospitalization: No Has patient had a PCN reaction occurring within the last 10 years: Unknown If all of the above answers are "NO", then may proceed with Cephalosporin use.   . Avelox [Moxifloxacin Hcl In Nacl] Other (See Comments)    Caused pain in shoulders to finger, tendonitis  . Moxifloxacin Other (See Comments)    Caused pain in shoulders to finger, tendonitis  . Adhesive [Tape] Dermatitis    Blisters skin  . Bextra [Valdecoxib] Hives and Swelling    SWELLING REACTION UNSPECIFIED   . Doxycycline Hyclate Hives and Swelling    SWELLING REACTION UNSPECIFIED   . Gadobutrol Hives  . Gadolinium Derivatives Hives  . Gadoversetamide Hives  . Iodinated Diagnostic Agents Hives  . Lamictal [Lamotrigine] Hives  . Naproxen Hypertension  . Percolone [Oxycodone] Hives  . Relafen [Nabumetone] Hypertension  . Risperdal [Risperidone] Other (See Comments)    Leg weakness   . Fetzima [Levomilnacipran] Other (See Comments)    Tremors across head   . Nitrofurantoin Other (See Comments)    UNSPECIFIED REACTION   . Brintellix [Vortioxetine] Nausea And Vomiting    Metabolic Disorder Labs: No results found for: HGBA1C, MPG Lab Results  Component Value Date   PROLACTIN 13.1 06/09/2017   No results found for: CHOL, TRIG, HDL, CHOLHDL, VLDL, LDLCALC No results found for: TSH  Therapeutic Level Labs: No results found for: LITHIUM No results found for: VALPROATE No components found for:  CBMZ  Current Medications: Current Outpatient Medications  Medication Sig Dispense Refill  . Acetaminophen (TYLENOL PO) Take 1 tablet by mouth daily as needed (PAIN).    Marland Kitchen albuterol (PROAIR HFA) 108 (90 Base) MCG/ACT inhaler  Inhale 2 puffs into the lungs every 4 (four) hours as needed for wheezing or shortness of breath. Inhale two puffs every four to six hours as needed for cough or wheeze. 1 Inhaler 0  . albuterol (PROAIR HFA) 108 (90 Base) MCG/ACT inhaler INHALE TWO PUFFS EVERY FOUR TO SIX HOURS AS NEEDED FOR COUGH OR WHEEZE. 8.5 Inhaler 0  . albuterol (PROVENTIL) (2.5 MG/3ML) 0.083% nebulizer solution PLACE 1 VIAL IN NEBULIZER EVERY 6 HOURS AS NEEDED FOR WHEEZING UP TO  30 DAYS  5  . allopurinol (ZYLOPRIM) 100 MG tablet Take by mouth.    . ARIPiprazole (ABILIFY) 30 MG tablet Take 1 tablet (30 mg total) by mouth daily. 90 tablet 0  . aspirin 81 MG tablet Take 81 mg by mouth daily.    Marland Kitchen azithromycin (ZITHROMAX) 250 MG tablet TAKE ONE TABLET 3 TIMES WEEKLY 12 tablet 5  . BENICAR 40 MG tablet Take 20 mg by mouth daily at 12 noon.   4  . buPROPion (WELLBUTRIN XL) 300 MG 24 hr tablet TAKE 1 TABLET (300 MG TOTAL) BY MOUTH EVERY MORNING. 90 tablet 0  . Cholecalciferol (VITAMIN D-3) 5000 UNITS TABS Take 5,000 Units by mouth daily.     . clotrimazole-betamethasone (LOTRISONE) cream APPLY SPARINGLY TO THE AFFECTED AREA(S) TWICE DAILY AS NEEDED FOR IRRITATION  5  . furosemide (LASIX) 20 MG tablet Take 20 mg by mouth daily. Take 1-2 tabs po up to twice a day as needed for edema     . hydrOXYzine (ATARAX/VISTARIL) 10 MG tablet Take 1 tablet (10 mg total) by mouth 2 (two) times daily as needed. 180 tablet 0  . ketoconazole (NIZORAL) 2 % shampoo Apply 1 application topically as needed for irritation.     Marland Kitchen loratadine (CLARITIN) 10 MG tablet Take 10 mg by mouth at bedtime.     Marland Kitchen lubiprostone (AMITIZA) 24 MCG capsule Take 24 mcg by mouth daily as needed for constipation.     . meloxicam (MOBIC) 7.5 MG tablet Take 7.5 mg by mouth 2 (two) times daily.  0  . metoprolol succinate (TOPROL-XL) 25 MG 24 hr tablet Take by mouth.    . mupirocin ointment (BACTROBAN) 2 % Place 1 application into the nose 2 (two) times daily as needed  (IRRITATION).     Marland Kitchen ondansetron (ZOFRAN-ODT) 4 MG disintegrating tablet Take 4 mg by mouth 2 (two) times daily as needed for nausea or vomiting.    . pregabalin (LYRICA) 75 MG capsule Take 75 mg by mouth 2 (two) times daily. Reported on 10/09/2015    . QVAR 80 MCG/ACT inhaler USE 1 PUFF DAILY TO PREVENT COUGH/WHEEZE-RINSE,GARGLE & SPIT AFTER USE-INCREASE TO 3 PUFFS W/FLAREUP (Patient taking differently: USE 1 PUFF DAILY AS NEEDED FOR COUGH/WHEEZE-RINSE,GARGLE & SPIT AFTER USE-INCREASE TO 3 PUFFS W/FLAREUP) 8.7 g 5  . RABEprazole (ACIPHEX) 20 MG tablet Take 20 mg by mouth daily.    . ranitidine (ZANTAC) 300 MG tablet Take 300 mg by mouth at bedtime.    Marland Kitchen spironolactone (ALDACTONE) 25 MG tablet Take 25 mg by mouth daily.    . traMADol (ULTRAM) 50 MG tablet Take 50 mg by mouth.    . triamcinolone cream (KENALOG) 0.1 % Apply 1 application topically 2 (two) times daily as needed (IRRITATION).     Marland Kitchen triamcinolone ointment (KENALOG) 0.5 % Apply 1 application topically 2 (two) times daily as needed (IRRITATION).     Marland Kitchen vitamin B-12 (CYANOCOBALAMIN) 1000 MCG tablet Take 1,000 mcg by mouth daily.    . Vitamin D, Ergocalciferol, (DRISDOL) 50000 units CAPS capsule Take by mouth.     No current facility-administered medications for this visit.      Musculoskeletal: Strength & Muscle Tone: within normal limits Gait & Station: broad based, walking with cane Patient leans: N/A  Psychiatric Specialty Exam: Review of Systems  Skin: Negative for itching and rash.       Dry skin  Neurological: Negative for dizziness, tingling, sensory change, speech change and headaches.    Blood  pressure (!) 148/90, pulse 95, height 5' 9.5" (1.765 m), weight 266 lb (120.7 kg).Body mass index is 38.72 kg/m.  General Appearance: Fairly Groomed  Eye Contact:  Good  Speech:  Clear and Coherent and Normal Rate  Volume:  Normal  Mood:  Anxious  Affect:  Blunt  Thought Process:  Goal Directed and Descriptions of  Associations: Intact  Orientation:  Full (Time, Place, and Person)  Thought Content: Logical   Suicidal Thoughts:  No  Homicidal Thoughts:  No  Memory:  Immediate;   Good Recent;   Good Remote;   Good  Judgement:  Fair  Insight:  Fair  Psychomotor Activity:  Normal  Concentration:  Concentration: Good and Attention Span: Good  Recall:  Good  Fund of Knowledge: Good  Language: Good  Akathisia:  No  Handed:  Right  AIMS (if indicated): not done  Assets:  Communication Skills Desire for Improvement Housing Talents/Skills Transportation  ADL's:  Intact  Cognition: WNL  Sleep:  Good   Screenings: PHQ2-9     Patient Outreach Telephone from 06/02/2017 in [facility] Patient Outreach Telephone from 03/10/2017 in [facility]  PHQ-2 Total Score  0  0       Assessment and Plan: MDD-recurrent, moderate; Tourette's disorder; anxiety disorder unspecified    Medication management with supportive therapy. Risks and benefits, side effects and alternative treatment options discussed with patient. Pt was given an opportunity to ask questions about medication, illness, and treatment. All current psychiatric medications have been reviewed and discussed with the patient and adjusted as clinically appropriate. The patient has been provided an accurate and updated list of the medications being now prescribed. Patient expressed understanding of how their medications were to be used.  Pt verbalized understanding and verbal consent obtained for treatment.  The risk of un-intended pregnancy is high based on the fact that pt reports she is not using any contraceptive. Pt denies current sexual activity. Pt is aware that these meds carry a teratogenic risk. Pt will discuss plan of action if she does or plans to become pregnant in the future.  Status of current problems: worsening tics  Meds: increase Abilify 30 mg p.o. daily for MDD and Tourette's Wellbutrin XL 300 mg p.o.  daily for MDD.  Higher doses caused hypertension in the past Vistaril 10 mg p.o. 2 times daily as needed anxiety   Labs: none  Therapy: brief supportive therapy provided. Discussed psychosocial stressors in detail.   Encouraged pt to develop daily routine and work on daily goal setting as a way to improve mood symptoms.   Consultations: Encouraged to follow up with therapist Encouraged to follow up with PCP as needed  Pt denies SI and is at an acute low risk for suicide. Patient told to call clinic if any problems occur. Patient advised to go to ER if they should develop SI/HI, side effects, or if symptoms worsen. Has crisis numbers to call if needed. Pt verbalized understanding.  F/up in 2 months or sooner if needed    Charlcie Cradle, MD 11/19/2017, 1:33 PM

## 2017-11-29 ENCOUNTER — Other Ambulatory Visit (HOSPITAL_COMMUNITY): Payer: Self-pay | Admitting: Psychiatry

## 2017-11-29 DIAGNOSIS — F332 Major depressive disorder, recurrent severe without psychotic features: Secondary | ICD-10-CM

## 2017-11-29 DIAGNOSIS — F952 Tourette's disorder: Secondary | ICD-10-CM

## 2017-12-01 DIAGNOSIS — M5440 Lumbago with sciatica, unspecified side: Secondary | ICD-10-CM | POA: Diagnosis not present

## 2017-12-01 DIAGNOSIS — I878 Other specified disorders of veins: Secondary | ICD-10-CM | POA: Diagnosis not present

## 2017-12-01 DIAGNOSIS — I10 Essential (primary) hypertension: Secondary | ICD-10-CM | POA: Diagnosis not present

## 2017-12-01 DIAGNOSIS — G4733 Obstructive sleep apnea (adult) (pediatric): Secondary | ICD-10-CM | POA: Diagnosis not present

## 2017-12-01 DIAGNOSIS — J4540 Moderate persistent asthma, uncomplicated: Secondary | ICD-10-CM | POA: Diagnosis not present

## 2017-12-02 DIAGNOSIS — R000 Tachycardia, unspecified: Secondary | ICD-10-CM | POA: Diagnosis not present

## 2017-12-02 DIAGNOSIS — I10 Essential (primary) hypertension: Secondary | ICD-10-CM | POA: Diagnosis not present

## 2017-12-02 DIAGNOSIS — G4733 Obstructive sleep apnea (adult) (pediatric): Secondary | ICD-10-CM | POA: Diagnosis not present

## 2017-12-03 DIAGNOSIS — G894 Chronic pain syndrome: Secondary | ICD-10-CM | POA: Diagnosis not present

## 2017-12-03 DIAGNOSIS — R000 Tachycardia, unspecified: Secondary | ICD-10-CM | POA: Diagnosis not present

## 2017-12-03 DIAGNOSIS — R5381 Other malaise: Secondary | ICD-10-CM | POA: Diagnosis not present

## 2017-12-03 DIAGNOSIS — R5383 Other fatigue: Secondary | ICD-10-CM | POA: Diagnosis not present

## 2017-12-03 DIAGNOSIS — E559 Vitamin D deficiency, unspecified: Secondary | ICD-10-CM | POA: Diagnosis not present

## 2017-12-03 DIAGNOSIS — E782 Mixed hyperlipidemia: Secondary | ICD-10-CM | POA: Diagnosis not present

## 2017-12-03 DIAGNOSIS — Z79899 Other long term (current) drug therapy: Secondary | ICD-10-CM | POA: Diagnosis not present

## 2017-12-03 DIAGNOSIS — E538 Deficiency of other specified B group vitamins: Secondary | ICD-10-CM | POA: Diagnosis not present

## 2017-12-04 DIAGNOSIS — M545 Low back pain: Secondary | ICD-10-CM | POA: Diagnosis not present

## 2017-12-04 DIAGNOSIS — M6281 Muscle weakness (generalized): Secondary | ICD-10-CM | POA: Diagnosis not present

## 2017-12-04 DIAGNOSIS — M5440 Lumbago with sciatica, unspecified side: Secondary | ICD-10-CM | POA: Diagnosis not present

## 2017-12-04 DIAGNOSIS — R2689 Other abnormalities of gait and mobility: Secondary | ICD-10-CM | POA: Diagnosis not present

## 2017-12-07 DIAGNOSIS — M7712 Lateral epicondylitis, left elbow: Secondary | ICD-10-CM | POA: Diagnosis not present

## 2017-12-08 DIAGNOSIS — M5440 Lumbago with sciatica, unspecified side: Secondary | ICD-10-CM | POA: Diagnosis not present

## 2017-12-08 DIAGNOSIS — R2689 Other abnormalities of gait and mobility: Secondary | ICD-10-CM | POA: Diagnosis not present

## 2017-12-08 DIAGNOSIS — M545 Low back pain: Secondary | ICD-10-CM | POA: Diagnosis not present

## 2017-12-08 DIAGNOSIS — M6281 Muscle weakness (generalized): Secondary | ICD-10-CM | POA: Diagnosis not present

## 2017-12-10 DIAGNOSIS — R2689 Other abnormalities of gait and mobility: Secondary | ICD-10-CM | POA: Diagnosis not present

## 2017-12-10 DIAGNOSIS — M5440 Lumbago with sciatica, unspecified side: Secondary | ICD-10-CM | POA: Diagnosis not present

## 2017-12-10 DIAGNOSIS — M6281 Muscle weakness (generalized): Secondary | ICD-10-CM | POA: Diagnosis not present

## 2017-12-10 DIAGNOSIS — M545 Low back pain: Secondary | ICD-10-CM | POA: Diagnosis not present

## 2017-12-11 DIAGNOSIS — N644 Mastodynia: Secondary | ICD-10-CM | POA: Diagnosis not present

## 2017-12-14 ENCOUNTER — Telehealth: Payer: Self-pay

## 2017-12-14 NOTE — Telephone Encounter (Signed)
Returned call to [employer].  She stated she has been having trouble with breathing and asked about the directions for her "action plan."  I reminded her to use her QVAR 80 3 puffs 3 times daily during flare-ups.  She is going to initiate the "action plan" and let us know if she does not improve.

## 2017-12-15 DIAGNOSIS — M6281 Muscle weakness (generalized): Secondary | ICD-10-CM | POA: Diagnosis not present

## 2017-12-15 DIAGNOSIS — M5440 Lumbago with sciatica, unspecified side: Secondary | ICD-10-CM | POA: Diagnosis not present

## 2017-12-15 DIAGNOSIS — R2689 Other abnormalities of gait and mobility: Secondary | ICD-10-CM | POA: Diagnosis not present

## 2017-12-15 DIAGNOSIS — M545 Low back pain: Secondary | ICD-10-CM | POA: Diagnosis not present

## 2017-12-17 ENCOUNTER — Encounter: Payer: Self-pay | Admitting: Allergy and Immunology

## 2017-12-17 ENCOUNTER — Ambulatory Visit (INDEPENDENT_AMBULATORY_CARE_PROVIDER_SITE_OTHER): Payer: PPO | Admitting: Allergy and Immunology

## 2017-12-17 VITALS — BP 136/80 | HR 88 | Resp 20

## 2017-12-17 DIAGNOSIS — M5136 Other intervertebral disc degeneration, lumbar region: Secondary | ICD-10-CM | POA: Diagnosis not present

## 2017-12-17 DIAGNOSIS — K219 Gastro-esophageal reflux disease without esophagitis: Secondary | ICD-10-CM

## 2017-12-17 DIAGNOSIS — J4521 Mild intermittent asthma with (acute) exacerbation: Secondary | ICD-10-CM | POA: Diagnosis not present

## 2017-12-17 DIAGNOSIS — J3089 Other allergic rhinitis: Secondary | ICD-10-CM | POA: Diagnosis not present

## 2017-12-17 DIAGNOSIS — I878 Other specified disorders of veins: Secondary | ICD-10-CM | POA: Diagnosis not present

## 2017-12-17 DIAGNOSIS — R600 Localized edema: Secondary | ICD-10-CM | POA: Diagnosis not present

## 2017-12-17 DIAGNOSIS — M150 Primary generalized (osteo)arthritis: Secondary | ICD-10-CM | POA: Diagnosis not present

## 2017-12-17 MED ORDER — METHYLPREDNISOLONE ACETATE 80 MG/ML IJ SUSP
40.0000 mg | Freq: Once | INTRAMUSCULAR | Status: AC
  Administered 2017-12-17: 40 mg via INTRAMUSCULAR

## 2017-12-17 MED ORDER — METHYLPREDNISOLONE ACETATE 40 MG/ML IJ SUSP: 40.0000 mg | Freq: Once | INTRAMUSCULAR | Status: DC

## 2017-12-17 NOTE — Progress Notes (Signed)
Follow-up Note  Referring Provider: Raina Mina., MD Primary Provider: Raina Mina., MD Date of Office Visit: 12/17/2017  Subjective:   Penny Porter (DOB: [birth year]) is a 52 y.o. female who returns to the [facility] on 12/17/2017 in re-evaluation of the following:  HPI: [name] presents to this clinic in evaluation of asthma and allergic rhinitis and LPR.  Her last visit to this clinic was 03 September 2017.  She was doing wonderful with her respiratory track without any flares of her asthma or issues with her upper airway or issues with her throat or reflux while consistently using a nasal steroid and aggressive therapy directed against reflux without the use of an inhaled steroid on a regular basis.  Rarely did she use a short acting bronchodilator.  She has not required a systemic steroid or antibiotic to treat any type of respiratory tract issue other than a recent upper respiratory tract infection about 1 month ago requiring the administration of azithromycin.Marland Kitchen  However, about 1 week ago she developed some problems with slight cough and some shortness of breath whenever she went outdoors.  She started her Qvar action plan this Monday, 3 days ago, and has had to use a short acting bronchodilator throughout the past several days.  There is no fever or ugly nasal discharge or associated nasal symptoms and there is no chest pain or sputum production and she has not been having any problems with reflux.  There has not really been a significant environmental change that may account for this flareup and she is not on any new medications that may also affect her airway function.  Allergies as of 12/17/2017      Reactions   Amoxicillin Hives, Swelling   LIP SWELLING PATIENT HAS HAD A PCN REACTION WITH IMMEDIATE RASH, FACIAL/TONGUE/THROAT SWELLING, SOB, OR LIGHTHEADEDNESS WITH HYPOTENSION:  #  #  #  YES  #  #  #   HAS PT DEVELOPED SEVERE RASH INVOLVING MUCUS MEMBRANES or SKIN  NECROSIS: #  #  #  YES  #  #  #  Has patient had a PCN reaction that required hospitalization: No Has patient had a PCN reaction occurring within the last 10 years: Unknown If all of the above answers are "NO", then may proceed with Cephalosporin use.   Avelox [moxifloxacin Hcl In Nacl] Other (See Comments)   Caused pain in shoulders to finger, tendonitis   Moxifloxacin Other (See Comments)   Caused pain in shoulders to finger, tendonitis   Adhesive [tape] Dermatitis   Blisters skin   Bextra [valdecoxib] Hives, Swelling   SWELLING REACTION UNSPECIFIED    Doxycycline Other (See Comments)   Doxycycline Hyclate Hives, Swelling   SWELLING REACTION UNSPECIFIED    Gadobutrol Hives   Gadolinium Derivatives Hives   Gadoversetamide Hives   Iodinated Diagnostic Agents Hives   Lamictal [lamotrigine] Hives   Naproxen Hypertension   Oxycodone-acetaminophen Other (See Comments)   Percolone [oxycodone] Hives   Relafen [nabumetone] Hypertension   Risperdal [risperidone] Other (See Comments)   Leg weakness   Fetzima [levomilnacipran] Other (See Comments)   Tremors across head   Nitrofurantoin Other (See Comments)   UNSPECIFIED REACTION    Brintellix [vortioxetine] Nausea And Vomiting      Medication List      albuterol (2.5 MG/3ML) 0.083% nebulizer solution Commonly known as:  PROVENTIL PLACE 1 VIAL IN NEBULIZER EVERY 6 HOURS AS NEEDED FOR WHEEZING UP TO 30 DAYS   albuterol 108 (  90 Base) MCG/ACT inhaler Commonly known as:  PROAIR HFA INHALE TWO PUFFS EVERY FOUR TO SIX HOURS AS NEEDED FOR COUGH OR WHEEZE.   allopurinol 100 MG tablet Commonly known as:  ZYLOPRIM Take by mouth.   ARIPiprazole 30 MG tablet Commonly known as:  ABILIFY Take 1 tablet (30 mg total) by mouth daily.   aspirin 81 MG tablet Take 81 mg by mouth daily.   azithromycin 250 MG tablet Commonly known as:  ZITHROMAX TAKE ONE TABLET 3 TIMES WEEKLY   buPROPion 300 MG 24 hr tablet Commonly known as:  WELLBUTRIN  XL TAKE 1 TABLET (300 MG TOTAL) BY MOUTH EVERY MORNING.   clotrimazole-betamethasone cream Commonly known as:  LOTRISONE APPLY SPARINGLY TO THE AFFECTED AREA(S) TWICE DAILY AS NEEDED FOR IRRITATION   hydrOXYzine 10 MG tablet Commonly known as:  ATARAX/VISTARIL Take 1 tablet (10 mg total) by mouth 2 (two) times daily as needed.   ketoconazole 2 % shampoo Commonly known as:  NIZORAL Apply 1 application topically as needed for irritation.   loratadine 10 MG tablet Commonly known as:  CLARITIN Take 10 mg by mouth at bedtime.   lubiprostone 24 MCG capsule Commonly known as:  AMITIZA Take 24 mcg by mouth daily as needed for constipation.   meloxicam 7.5 MG tablet Commonly known as:  MOBIC Take 7.5 mg by mouth 2 (two) times daily.   metoprolol succinate 25 MG 24 hr tablet Commonly known as:  TOPROL-XL Take by mouth.   mupirocin ointment 2 % Commonly known as:  BACTROBAN Place 1 application into the nose 2 (two) times daily as needed (IRRITATION).   ondansetron 4 MG disintegrating tablet Commonly known as:  ZOFRAN-ODT Take 4 mg by mouth 2 (two) times daily as needed for nausea or vomiting.   pregabalin 75 MG capsule Commonly known as:  LYRICA Take 75 mg by mouth 2 (two) times daily. Reported on 10/09/2015   QVAR 80 MCG/ACT inhaler Generic drug:  beclomethasone USE 1 PUFF DAILY TO PREVENT COUGH/WHEEZE-RINSE,GARGLE & SPIT AFTER USE-INCREASE TO 3 PUFFS W/FLAREUP   RABEprazole 20 MG tablet Commonly known as:  ACIPHEX Take 20 mg by mouth daily.   ranitidine 300 MG tablet Commonly known as:  ZANTAC Take 300 mg by mouth at bedtime.   spironolactone 25 MG tablet Commonly known as:  ALDACTONE Take 25 mg by mouth daily.   torsemide 20 MG tablet Commonly known as:  DEMADEX Take 20 mg by mouth daily.   traMADol 50 MG tablet Commonly known as:  ULTRAM Take 50 mg by mouth.   triamcinolone cream 0.1 % Commonly known as:  KENALOG Apply 1 application topically 2 (two) times  daily as needed (IRRITATION).   triamcinolone ointment 0.5 % Commonly known as:  KENALOG Apply 1 application topically 2 (two) times daily as needed (IRRITATION).   TYLENOL PO Take 1 tablet by mouth daily as needed (PAIN).   vitamin B-12 1000 MCG tablet Commonly known as:  CYANOCOBALAMIN Take 1,000 mcg by mouth daily.   Vitamin D (Ergocalciferol) 50000 units Caps capsule Commonly known as:  DRISDOL Take by mouth.   Vitamin D-3 5000 units Tabs Take 5,000 Units by mouth daily.       Past Medical History:  Diagnosis Date  . Anxiety   . Arthritis    Back and hip  . Asthma   . Bursitis   . Complication of anesthesia    bladder hard to wake up, never needed a in and out cath  . Costochondral chest  pain   . Degenerative disk disease   . Depression   . Gastroparesis   . GERD (gastroesophageal reflux disease)   . Hypertension   . Lumbar herniated disc   . Sleep apnea    not using CPAP  . Stenosis of lumbosacral spine   . Thyroid nodule   . Tourette disease   . Trigeminal nerve disease     Past Surgical History:  Procedure Laterality Date  . BACK SURGERY  September 19, 2015  . bone spur removal on back    . CHOLECYSTECTOMY  2005  . COLONOSCOPY    . ELBOW SURGERY Left May 29, 2014  . ESOPHAGOGASTRODUODENOSCOPY    . KNEE SURGERY  06/2016  . polyp removal    . POSTERIOR FUSION LUMBAR SPINE  06/26/2017  . ruptured disk  2008  . TEAR DUCT PROBING  2009    Review of systems negative except as noted in HPI / PMHx or noted below:  Review of Systems  Constitutional: Negative.   HENT: Negative.   Eyes: Negative.   Respiratory: Negative.   Cardiovascular: Negative.   Gastrointestinal: Negative.   Genitourinary: Negative.   Musculoskeletal: Negative.   Skin: Negative.   Neurological: Negative.   Endo/Heme/Allergies: Negative.   Psychiatric/Behavioral: Negative.      Objective:   Vitals:   12/17/17 1444  BP: 136/80  Pulse: 88  Resp: 20           Physical Exam  HENT:  Head: Normocephalic.  Right Ear: Tympanic membrane, external ear and ear canal normal.  Left Ear: Tympanic membrane, external ear and ear canal normal.  Nose: Nose normal. No mucosal edema or rhinorrhea.  Mouth/Throat: Uvula is midline, oropharynx is clear and moist and mucous membranes are normal. No oropharyngeal exudate.  Eyes: Conjunctivae are normal.  Neck: Trachea normal. No tracheal tenderness present. No tracheal deviation present. No thyromegaly present.  Cardiovascular: Normal rate, regular rhythm, S1 normal, S2 normal and normal heart sounds.  No murmur heard. Pulmonary/Chest: Breath sounds normal. No stridor. No respiratory distress. She has no wheezes. She has no rales.  Musculoskeletal: She exhibits no edema (Compression stockings).  Lymphadenopathy:       Head (right side): No tonsillar adenopathy present.       Head (left side): No tonsillar adenopathy present.    She has no cervical adenopathy.  Neurological: She is alert.  Skin: No rash noted. She is not diaphoretic. No erythema. Nails show no clubbing.    Diagnostics:    Spirometry was performed and demonstrated an FEV1 of 1.84 at 56 % of predicted.  The patient had an Asthma Control Test with the following results: ACT Total Score: 17.    Assessment and Plan:   1. Asthma, not well controlled, mild intermittent, with acute exacerbation   2. Other allergic rhinitis   3. LPRD (laryngopharyngeal reflux disease)     1. Consistently use Qvar 80 REDIHALER 2 inhalations 2 times per day  2. Depomedrol 40 IM delivered in clinic today  3. Use OTC Nasacort or OTC Rhinocort one spray each nostril 3-7 times per week during upper airway symptoms  4. Continue AcipHex 20 mg in the morning and ranitidine 300 mg in the evening  5. Continue ProAir HFA or similar and antihistamine if needed  6. Return to clinic in 4 weeks or earlier if problem  Luetta appears to have developed an inflammatory flare  of her respiratory tract with unknown etiologic factor.  This is most likely viral  in nature and we will make that assumption and treat her with short course of systemic steroids and she will now consistently use her Qvar for at least the next 4 weeks.  I will see her at that point in time and will make a decision about what therapy she needs to use on a long-term basis regarding this issue.  She will also continue to treat her upper airway inflammation and treat reflux as noted above.  Allena Katz, MD Allergy / Immunology [facility]

## 2017-12-17 NOTE — Patient Instructions (Addendum)
  1. Consistently use Qvar 80 REDIHALER 2 inhalations 2 times per day  2. Depomedrol 40 IM delivered in clinic today  3. Use OTC Nasacort or OTC Rhinocort one spray each nostril 3-7 times per week during upper airway symptoms  4. Continue AcipHex 20 mg in the morning and ranitidine 300 mg in the evening  5. Continue ProAir HFA or similar and antihistamine if needed  6. Return to clinic in 4 weeks or earlier if problem

## 2017-12-21 ENCOUNTER — Encounter: Payer: Self-pay | Admitting: Allergy and Immunology

## 2017-12-22 DIAGNOSIS — M6281 Muscle weakness (generalized): Secondary | ICD-10-CM | POA: Diagnosis not present

## 2017-12-22 DIAGNOSIS — R2689 Other abnormalities of gait and mobility: Secondary | ICD-10-CM | POA: Diagnosis not present

## 2017-12-22 DIAGNOSIS — M545 Low back pain: Secondary | ICD-10-CM | POA: Diagnosis not present

## 2017-12-22 DIAGNOSIS — M5440 Lumbago with sciatica, unspecified side: Secondary | ICD-10-CM | POA: Diagnosis not present

## 2017-12-24 DIAGNOSIS — R928 Other abnormal and inconclusive findings on diagnostic imaging of breast: Secondary | ICD-10-CM | POA: Diagnosis not present

## 2017-12-24 DIAGNOSIS — N644 Mastodynia: Secondary | ICD-10-CM | POA: Diagnosis not present

## 2017-12-24 DIAGNOSIS — M545 Low back pain: Secondary | ICD-10-CM | POA: Diagnosis not present

## 2017-12-24 DIAGNOSIS — M6281 Muscle weakness (generalized): Secondary | ICD-10-CM | POA: Diagnosis not present

## 2017-12-24 DIAGNOSIS — R2689 Other abnormalities of gait and mobility: Secondary | ICD-10-CM | POA: Diagnosis not present

## 2017-12-24 DIAGNOSIS — M5440 Lumbago with sciatica, unspecified side: Secondary | ICD-10-CM | POA: Diagnosis not present

## 2017-12-29 DIAGNOSIS — R2689 Other abnormalities of gait and mobility: Secondary | ICD-10-CM | POA: Diagnosis not present

## 2017-12-29 DIAGNOSIS — M5440 Lumbago with sciatica, unspecified side: Secondary | ICD-10-CM | POA: Diagnosis not present

## 2017-12-29 DIAGNOSIS — M6281 Muscle weakness (generalized): Secondary | ICD-10-CM | POA: Diagnosis not present

## 2017-12-29 DIAGNOSIS — M545 Low back pain: Secondary | ICD-10-CM | POA: Diagnosis not present

## 2017-12-30 DIAGNOSIS — M79604 Pain in right leg: Secondary | ICD-10-CM | POA: Diagnosis not present

## 2017-12-30 DIAGNOSIS — E785 Hyperlipidemia, unspecified: Secondary | ICD-10-CM | POA: Diagnosis not present

## 2017-12-30 DIAGNOSIS — R200 Anesthesia of skin: Secondary | ICD-10-CM | POA: Diagnosis not present

## 2017-12-30 DIAGNOSIS — I878 Other specified disorders of veins: Secondary | ICD-10-CM | POA: Diagnosis not present

## 2017-12-30 DIAGNOSIS — I10 Essential (primary) hypertension: Secondary | ICD-10-CM | POA: Diagnosis not present

## 2017-12-31 ENCOUNTER — Telehealth: Payer: Self-pay

## 2017-12-31 DIAGNOSIS — M5440 Lumbago with sciatica, unspecified side: Secondary | ICD-10-CM | POA: Diagnosis not present

## 2017-12-31 DIAGNOSIS — R2689 Other abnormalities of gait and mobility: Secondary | ICD-10-CM | POA: Diagnosis not present

## 2017-12-31 DIAGNOSIS — M6281 Muscle weakness (generalized): Secondary | ICD-10-CM | POA: Diagnosis not present

## 2017-12-31 DIAGNOSIS — M545 Low back pain: Secondary | ICD-10-CM | POA: Diagnosis not present

## 2017-12-31 MED ORDER — ALBUTEROL SULFATE 108 (90 BASE) MCG/ACT IN AEPB: 2.0000 | INHALATION_SPRAY | RESPIRATORY_TRACT | 1 refills | Status: DC | PRN

## 2017-12-31 NOTE — Telephone Encounter (Signed)
Patient called in stating that her QVAR does not seem to be coming out of the inhaler as much as it was when she first began.  I told her to try to wipe off the dispensing area with a dry cloth to see if there is maybe some kind of blockage.  She did state that she feels like it has been harder to get a deep breath starting this morning.  I told her she can increase her QVAR 80 to 3 puffs 3 times daily and see if that helps.  She has an upcoming appointment in early June.

## 2017-12-31 NOTE — Telephone Encounter (Signed)
Maybe she can try a new sample to see if her MDI is defective.

## 2018-01-01 DIAGNOSIS — M79604 Pain in right leg: Secondary | ICD-10-CM | POA: Diagnosis not present

## 2018-01-01 DIAGNOSIS — I878 Other specified disorders of veins: Secondary | ICD-10-CM | POA: Diagnosis not present

## 2018-01-01 DIAGNOSIS — R200 Anesthesia of skin: Secondary | ICD-10-CM | POA: Diagnosis not present

## 2018-01-01 DIAGNOSIS — R202 Paresthesia of skin: Secondary | ICD-10-CM | POA: Diagnosis not present

## 2018-01-01 NOTE — Telephone Encounter (Signed)
Left a message for Penny Porter to call the office. I have a sample for her up front.

## 2018-01-01 NOTE — Telephone Encounter (Signed)
Patient advised to come pick up sample. Will review how to use when she does

## 2018-01-06 DIAGNOSIS — M6281 Muscle weakness (generalized): Secondary | ICD-10-CM | POA: Diagnosis not present

## 2018-01-06 DIAGNOSIS — R2689 Other abnormalities of gait and mobility: Secondary | ICD-10-CM | POA: Diagnosis not present

## 2018-01-06 DIAGNOSIS — M545 Low back pain: Secondary | ICD-10-CM | POA: Diagnosis not present

## 2018-01-06 DIAGNOSIS — M5440 Lumbago with sciatica, unspecified side: Secondary | ICD-10-CM | POA: Diagnosis not present

## 2018-01-07 DIAGNOSIS — I10 Essential (primary) hypertension: Secondary | ICD-10-CM | POA: Diagnosis not present

## 2018-01-07 DIAGNOSIS — J310 Chronic rhinitis: Secondary | ICD-10-CM | POA: Insufficient documentation

## 2018-01-07 DIAGNOSIS — J4540 Moderate persistent asthma, uncomplicated: Secondary | ICD-10-CM | POA: Diagnosis not present

## 2018-01-08 DIAGNOSIS — M545 Low back pain: Secondary | ICD-10-CM | POA: Diagnosis not present

## 2018-01-08 DIAGNOSIS — M5440 Lumbago with sciatica, unspecified side: Secondary | ICD-10-CM | POA: Diagnosis not present

## 2018-01-08 DIAGNOSIS — R2689 Other abnormalities of gait and mobility: Secondary | ICD-10-CM | POA: Diagnosis not present

## 2018-01-08 DIAGNOSIS — M6281 Muscle weakness (generalized): Secondary | ICD-10-CM | POA: Diagnosis not present

## 2018-01-12 DIAGNOSIS — M545 Low back pain: Secondary | ICD-10-CM | POA: Diagnosis not present

## 2018-01-12 DIAGNOSIS — R2689 Other abnormalities of gait and mobility: Secondary | ICD-10-CM | POA: Diagnosis not present

## 2018-01-12 DIAGNOSIS — M6281 Muscle weakness (generalized): Secondary | ICD-10-CM | POA: Diagnosis not present

## 2018-01-12 DIAGNOSIS — M5440 Lumbago with sciatica, unspecified side: Secondary | ICD-10-CM | POA: Diagnosis not present

## 2018-01-13 DIAGNOSIS — F411 Generalized anxiety disorder: Secondary | ICD-10-CM | POA: Diagnosis not present

## 2018-01-13 DIAGNOSIS — F321 Major depressive disorder, single episode, moderate: Secondary | ICD-10-CM | POA: Diagnosis not present

## 2018-01-14 ENCOUNTER — Ambulatory Visit (INDEPENDENT_AMBULATORY_CARE_PROVIDER_SITE_OTHER): Payer: PPO | Admitting: Allergy and Immunology

## 2018-01-14 ENCOUNTER — Encounter: Payer: Self-pay | Admitting: Allergy and Immunology

## 2018-01-14 VITALS — BP 158/100 | HR 100 | Resp 20

## 2018-01-14 DIAGNOSIS — K219 Gastro-esophageal reflux disease without esophagitis: Secondary | ICD-10-CM | POA: Diagnosis not present

## 2018-01-14 DIAGNOSIS — J3089 Other allergic rhinitis: Secondary | ICD-10-CM

## 2018-01-14 DIAGNOSIS — J4540 Moderate persistent asthma, uncomplicated: Secondary | ICD-10-CM | POA: Diagnosis not present

## 2018-01-14 MED ORDER — BUDESONIDE-FORMOTEROL FUMARATE 160-4.5 MCG/ACT IN AERO: INHALATION_SPRAY | RESPIRATORY_TRACT | 5 refills | Status: DC

## 2018-01-14 NOTE — Patient Instructions (Signed)
  1. Change Qvar to Symbicort 160 - 2 inhalations 2 times a day with spacer  2. Use OTC Nasacort or OTC Rhinocort one spray each nostril 3-7 times per week during upper airway symptoms  3. Continue AcipHex 20 mg in the morning and ranitidine 300 mg in the evening  4. Continue ProAir HFA or similar and antihistamine if needed  5. Return to clinic in 4 weeks or earlier if problem

## 2018-01-14 NOTE — Progress Notes (Signed)
Follow-up Note  Referring Provider: Raina Mina., MD Primary Provider: Raina Mina., MD Date of Office Visit: 01/14/2018  Subjective:   Penny Porter (DOB: 03/26/1966) is a 52 y.o. female who returns to the [facility] on 01/14/2018 in re-evaluation of the following:  HPI: Penny Porter returns to this clinic in reevaluation of her asthma and allergic rhinitis and LPR.  Her last visit to this clinic was 17 Dec 2017 at which point in time she appeared to be having some slight flare of her asthma for which we treated her with a low-dose systemic steroid and had her consistently use her Qvar.  She is not really that much better.  She still has some shortness of breath and still has a slight cough and still occasionally uses a short acting bronchodilator.  Once again there is no obvious trigger that is giving rise to this activity.  She has had a little bit of problem with puffy eyes over the course of the past week or so for which she went to see her primary care doctor who asked her to double her dose of Claritin and use her Nasacort on a daily basis which has helped her regarding her eye issue.  She believes that her reflux is under very good control at this point although she occasionally has some postnasal drip and throat clearing.   Allergies as of 01/14/2018      Reactions   Amoxicillin Hives, Swelling   LIP SWELLING PATIENT HAS HAD A PCN REACTION WITH IMMEDIATE RASH, FACIAL/TONGUE/THROAT SWELLING, SOB, OR LIGHTHEADEDNESS WITH HYPOTENSION:  #  #  #  YES  #  #  #   HAS PT DEVELOPED SEVERE RASH INVOLVING MUCUS MEMBRANES or SKIN NECROSIS: #  #  #  YES  #  #  #  Has patient had a PCN reaction that required hospitalization: No Has patient had a PCN reaction occurring within the last 10 years: Unknown If all of the above answers are "NO", then may proceed with Cephalosporin use.   Avelox [moxifloxacin Hcl In Nacl] Other (See Comments)   Caused pain in shoulders to finger, tendonitis     Moxifloxacin Other (See Comments)   Caused pain in shoulders to finger, tendonitis   Adhesive [tape] Dermatitis   Blisters skin   Bextra [valdecoxib] Hives, Swelling   SWELLING REACTION UNSPECIFIED    Doxycycline Other (See Comments)   Doxycycline Hyclate Hives, Swelling   SWELLING REACTION UNSPECIFIED    Gadobutrol Hives   Gadolinium Derivatives Hives   Gadoversetamide Hives   Iodinated Diagnostic Agents Hives   Lamictal [lamotrigine] Hives   Naproxen Hypertension   Oxycodone-acetaminophen Other (See Comments)   Percolone [oxycodone] Hives   Relafen [nabumetone] Hypertension   Risperdal [risperidone] Other (See Comments)   Leg weakness   Fetzima [levomilnacipran] Other (See Comments)   Tremors across head   Nitrofurantoin Other (See Comments)   UNSPECIFIED REACTION    Brintellix [vortioxetine] Nausea And Vomiting      Medication List      albuterol (2.5 MG/3ML) 0.083% nebulizer solution Commonly known as:  PROVENTIL PLACE 1 VIAL IN NEBULIZER EVERY 6 HOURS AS NEEDED FOR WHEEZING UP TO 30 DAYS   Albuterol Sulfate 108 (90 Base) MCG/ACT Aepb Commonly known as:  PROAIR RESPICLICK Inhale 2 Doses into the lungs as needed (every four to six hours for cough or wheeze).   allopurinol 100 MG tablet Commonly known as:  ZYLOPRIM Take by mouth.   ARIPiprazole  30 MG tablet Commonly known as:  ABILIFY Take 1 tablet (30 mg total) by mouth daily.   aspirin 81 MG tablet Take 81 mg by mouth daily.   azithromycin 250 MG tablet Commonly known as:  ZITHROMAX TAKE ONE TABLET 3 TIMES WEEKLY   budesonide-formoterol 160-4.5 MCG/ACT inhaler Commonly known as:  SYMBICORT Inhale two puffs using spacer twice daily to prevent cough or wheeze.  Rinse, gargle, and spit after use.   buPROPion 300 MG 24 hr tablet Commonly known as:  WELLBUTRIN XL TAKE 1 TABLET (300 MG TOTAL) BY MOUTH EVERY MORNING.   clotrimazole-betamethasone cream Commonly known as:  LOTRISONE APPLY SPARINGLY TO THE  AFFECTED AREA(S) TWICE DAILY AS NEEDED FOR IRRITATION   hydrOXYzine 10 MG tablet Commonly known as:  ATARAX/VISTARIL Take 1 tablet (10 mg total) by mouth 2 (two) times daily as needed.   ketoconazole 2 % shampoo Commonly known as:  NIZORAL Apply 1 application topically as needed for irritation.   loratadine 10 MG tablet Commonly known as:  CLARITIN Take 10 mg by mouth at bedtime.   lubiprostone 24 MCG capsule Commonly known as:  AMITIZA Take 24 mcg by mouth daily as needed for constipation.   meloxicam 7.5 MG tablet Commonly known as:  MOBIC Take 7.5 mg by mouth 2 (two) times daily.   metoprolol succinate 25 MG 24 hr tablet Commonly known as:  TOPROL-XL Take by mouth.   mupirocin ointment 2 % Commonly known as:  BACTROBAN Place 1 application into the nose 2 (two) times daily as needed (IRRITATION).   ondansetron 4 MG disintegrating tablet Commonly known as:  ZOFRAN-ODT Take 4 mg by mouth 2 (two) times daily as needed for nausea or vomiting.   pregabalin 75 MG capsule Commonly known as:  LYRICA Take 75 mg by mouth 2 (two) times daily. Reported on 10/09/2015   QVAR REDIHALER 80 MCG/ACT inhaler Generic drug:  beclomethasone Inhale 2 puffs into the lungs 2 (two) times daily.   RABEprazole 20 MG tablet Commonly known as:  ACIPHEX Take 20 mg by mouth daily.   ranitidine 300 MG tablet Commonly known as:  ZANTAC Take 300 mg by mouth at bedtime.   spironolactone 25 MG tablet Commonly known as:  ALDACTONE Take 25 mg by mouth daily.   torsemide 20 MG tablet Commonly known as:  DEMADEX Take 20 mg by mouth daily.   traMADol 50 MG tablet Commonly known as:  ULTRAM Take 50 mg by mouth.   triamcinolone cream 0.1 % Commonly known as:  KENALOG Apply 1 application topically 2 (two) times daily as needed (IRRITATION).   triamcinolone ointment 0.5 % Commonly known as:  KENALOG Apply 1 application topically 2 (two) times daily as needed (IRRITATION).   TYLENOL PO Take  1 tablet by mouth daily as needed (PAIN).   vitamin B-12 1000 MCG tablet Commonly known as:  CYANOCOBALAMIN Take 1,000 mcg by mouth daily.   Vitamin D-3 5000 units Tabs Take 5,000 Units by mouth daily.       Past Medical History:  Diagnosis Date  . Anxiety   . Arthritis    Back and hip  . Asthma   . Bursitis   . Complication of anesthesia    bladder hard to wake up, never needed a in and out cath  . Costochondral chest pain   . Degenerative disk disease   . Depression   . Gastroparesis   . GERD (gastroesophageal reflux disease)   . Hypertension   . Lumbar herniated disc   .  Sleep apnea    not using CPAP  . Stenosis of lumbosacral spine   . Thyroid nodule   . Tourette disease   . Trigeminal nerve disease     Past Surgical History:  Procedure Laterality Date  . BACK SURGERY  September 19, 2015  . bone spur removal on back    . CHOLECYSTECTOMY  2005  . COLONOSCOPY    . ELBOW SURGERY Left May 29, 2014  . ESOPHAGOGASTRODUODENOSCOPY    . KNEE SURGERY  06/2016  . polyp removal    . POSTERIOR FUSION LUMBAR SPINE  06/26/2017  . ruptured disk  2008  . TEAR DUCT PROBING  2009    Review of systems negative except as noted in HPI / PMHx or noted below:  Review of Systems  Constitutional: Negative.   HENT: Negative.   Eyes: Negative.   Respiratory: Negative.   Cardiovascular: Negative.   Gastrointestinal: Negative.   Genitourinary: Negative.   Musculoskeletal: Negative.   Skin: Negative.   Neurological: Negative.   Endo/Heme/Allergies: Negative.   Psychiatric/Behavioral: Negative.      Objective:   Vitals:   01/14/18 1144  BP: (!) 158/100  Pulse: 100  Resp: 20          Physical Exam  HENT:  Head: Normocephalic.  Right Ear: Tympanic membrane, external ear and ear canal normal.  Left Ear: Tympanic membrane, external ear and ear canal normal.  Nose: Nose normal. No mucosal edema or rhinorrhea.  Mouth/Throat: Uvula is midline, oropharynx is clear and  moist and mucous membranes are normal. No oropharyngeal exudate.  Eyes: Conjunctivae are normal.  Neck: Trachea normal. No tracheal tenderness present. No tracheal deviation present. No thyromegaly present.  Cardiovascular: Normal rate, regular rhythm, S1 normal, S2 normal and normal heart sounds.  No murmur heard. Pulmonary/Chest: Breath sounds normal. No stridor. No respiratory distress. She has no wheezes. She has no rales.  Musculoskeletal: She exhibits no edema.  Lymphadenopathy:       Head (right side): No tonsillar adenopathy present.       Head (left side): No tonsillar adenopathy present.    She has no cervical adenopathy.  Neurological: She is alert.  Skin: No rash noted. She is not diaphoretic. No erythema. Nails show no clubbing.    Diagnostics:    Spirometry was performed and demonstrated an FEV1 of 1.74 at 52 % of predicted.  The patient had an Asthma Control Test with the following results: ACT Total Score: 15.    Assessment and Plan:   1. Not well controlled moderate persistent asthma   2. Other allergic rhinitis   3. LPRD (laryngopharyngeal reflux disease)     1. Change Qvar to Symbicort 160 - 2 inhalations 2 times a day with spacer  2. Use OTC Nasacort or OTC Rhinocort one spray each nostril 3-7 times per week during upper airway symptoms  3. Continue AcipHex 20 mg in the morning and ranitidine 300 mg in the evening  4. Continue ProAir HFA or similar and antihistamine if needed  5. Return to clinic in 4 weeks or earlier if problem  Yatziri will use a combination inhaler for the next 4 weeks in conjunction with consistent use of a nasal steroid and therapy directed against reflux and I will see her back in this clinic at that point in time to make an assessment of her response and consider further evaluation and treatment based upon this response.  Allena Katz, MD Allergy / Immunology [facility]

## 2018-01-15 DIAGNOSIS — M545 Low back pain: Secondary | ICD-10-CM | POA: Diagnosis not present

## 2018-01-15 DIAGNOSIS — R2689 Other abnormalities of gait and mobility: Secondary | ICD-10-CM | POA: Diagnosis not present

## 2018-01-15 DIAGNOSIS — M5440 Lumbago with sciatica, unspecified side: Secondary | ICD-10-CM | POA: Diagnosis not present

## 2018-01-15 DIAGNOSIS — M6281 Muscle weakness (generalized): Secondary | ICD-10-CM | POA: Diagnosis not present

## 2018-01-18 ENCOUNTER — Encounter: Payer: Self-pay | Admitting: Allergy and Immunology

## 2018-01-18 DIAGNOSIS — Z8739 Personal history of other diseases of the musculoskeletal system and connective tissue: Secondary | ICD-10-CM | POA: Diagnosis not present

## 2018-01-18 DIAGNOSIS — M545 Low back pain: Secondary | ICD-10-CM | POA: Diagnosis not present

## 2018-01-18 DIAGNOSIS — M6281 Muscle weakness (generalized): Secondary | ICD-10-CM | POA: Diagnosis not present

## 2018-01-18 DIAGNOSIS — M79661 Pain in right lower leg: Secondary | ICD-10-CM | POA: Diagnosis not present

## 2018-01-18 DIAGNOSIS — I878 Other specified disorders of veins: Secondary | ICD-10-CM | POA: Diagnosis not present

## 2018-01-18 DIAGNOSIS — Z79899 Other long term (current) drug therapy: Secondary | ICD-10-CM | POA: Diagnosis not present

## 2018-01-18 DIAGNOSIS — R2689 Other abnormalities of gait and mobility: Secondary | ICD-10-CM | POA: Diagnosis not present

## 2018-01-18 DIAGNOSIS — M7989 Other specified soft tissue disorders: Secondary | ICD-10-CM | POA: Diagnosis not present

## 2018-01-18 DIAGNOSIS — M79671 Pain in right foot: Secondary | ICD-10-CM | POA: Diagnosis not present

## 2018-01-18 DIAGNOSIS — M5440 Lumbago with sciatica, unspecified side: Secondary | ICD-10-CM | POA: Diagnosis not present

## 2018-01-20 DIAGNOSIS — G4733 Obstructive sleep apnea (adult) (pediatric): Secondary | ICD-10-CM | POA: Diagnosis not present

## 2018-01-20 DIAGNOSIS — I10 Essential (primary) hypertension: Secondary | ICD-10-CM | POA: Diagnosis not present

## 2018-01-20 DIAGNOSIS — R000 Tachycardia, unspecified: Secondary | ICD-10-CM | POA: Diagnosis not present

## 2018-01-21 ENCOUNTER — Ambulatory Visit (INDEPENDENT_AMBULATORY_CARE_PROVIDER_SITE_OTHER): Payer: PPO | Admitting: Psychiatry

## 2018-01-21 ENCOUNTER — Telehealth: Payer: Self-pay

## 2018-01-21 DIAGNOSIS — F952 Tourette's disorder: Secondary | ICD-10-CM | POA: Diagnosis not present

## 2018-01-21 DIAGNOSIS — F332 Major depressive disorder, recurrent severe without psychotic features: Secondary | ICD-10-CM | POA: Diagnosis not present

## 2018-01-21 DIAGNOSIS — Z810 Family history of intellectual disabilities: Secondary | ICD-10-CM

## 2018-01-21 DIAGNOSIS — Z79899 Other long term (current) drug therapy: Secondary | ICD-10-CM | POA: Diagnosis not present

## 2018-01-21 DIAGNOSIS — F411 Generalized anxiety disorder: Secondary | ICD-10-CM | POA: Diagnosis not present

## 2018-01-21 MED ORDER — HYDROXYZINE HCL 10 MG PO TABS: 10.0000 mg | ORAL_TABLET | Freq: Two times a day (BID) | ORAL | 0 refills | Status: DC | PRN

## 2018-01-21 MED ORDER — BUPROPION HCL ER (XL) 300 MG PO TB24: ORAL_TABLET | ORAL | 0 refills | Status: DC

## 2018-01-21 MED ORDER — ARIPIPRAZOLE 30 MG PO TABS: 30.0000 mg | ORAL_TABLET | Freq: Every day | ORAL | 0 refills | Status: DC

## 2018-01-21 NOTE — Telephone Encounter (Signed)
Patient informed and samples placed up front.  Lot/EXP: [id number] F00/2-20; [id number] D00/11-19

## 2018-01-21 NOTE — Telephone Encounter (Signed)
Please inform patient that her throat issue may be from the steroid parts of the Symbicort. Please have her change to Symbicort 80 which is 1/2 of the steroid dose.

## 2018-01-21 NOTE — Progress Notes (Signed)
BH MD/PA/NP OP Progress Note  01/21/2018 3:12 PM Penny Porter  MRN:  [id number]  Chief Complaint:  Chief Complaint    Follow-up     HPI: "they are going ok I guess". She is in PT for her back. Pt likes to stay busy and often goes out to the store or out with her church group. Depression comes and goes. It is worse when stressed about her finances. She can't recall but thinks she was depressed for a day within the last month. On that day she will touch her hand to her nose and worry a lot. After it passes then she does not do that. Sleep is variable. If she wakes in the middle of the night then she can't fall back asleep. She does not nap most days. Her abdominal tics have not changed. Pt denies SI/HI. Pt states-taking meds as prescribed and denies SE.    Visit Diagnosis:    ICD-10-CM   1. Major depressive disorder, recurrent, severe without psychotic features ([place]) F33.2 ARIPiprazole (ABILIFY) 30 MG tablet    buPROPion (WELLBUTRIN XL) 300 MG 24 hr tablet  2. Tourette's disease F95.2 ARIPiprazole (ABILIFY) 30 MG tablet  3. Anxiety state F41.1 hydrOXYzine (ATARAX/VISTARIL) 10 MG tablet      Past Psychiatric History:  Anxiety:No Bipolar Disorder:No Depression:Yes Mania:No Psychosis:No Schizophrenia:No Personality Disorder:No Hospitalization for psychiatric illness:No History of Electroconvulsive Shock Therapy:No Prior Suicide Attempts:No    Past Medical History:  Past Medical History:  Diagnosis Date  . Anxiety   . Arthritis    Back and hip  . Asthma   . Bursitis   . Complication of anesthesia    bladder hard to wake up, never needed a in and out cath  . Costochondral chest pain   . Degenerative disk disease   . Depression   . Gastroparesis   . GERD (gastroesophageal reflux disease)   . Hypertension   . Lumbar herniated disc   . Sleep apnea    not using CPAP  . Stenosis of lumbosacral spine   . Thyroid nodule   . Tourette disease   . Trigeminal nerve  disease     Past Surgical History:  Procedure Laterality Date  . BACK SURGERY  September 19, 2015  . bone spur removal on back    . CHOLECYSTECTOMY  2005  . COLONOSCOPY    . ELBOW SURGERY Left May 29, 2014  . ESOPHAGOGASTRODUODENOSCOPY    . KNEE SURGERY  06/2016  . polyp removal    . POSTERIOR FUSION LUMBAR SPINE  06/26/2017  . ruptured disk  2008  . TEAR DUCT PROBING  2009    Family Psychiatric History: Family History  Problem Relation Age of Onset  . Mental retardation Sister   . Dementia Father   . Suicidality Neg Hx     Social History:  Social History   Socioeconomic History  . Marital status: Single    Spouse name: Not on file  . Number of children: Not on file  . Years of education: Not on file  . Highest education level: Not on file  Occupational History  . Not on file  Social Needs  . Financial resource strain: Not on file  . Food insecurity:    Worry: Not on file    Inability: Not on file  . Transportation needs:    Medical: Not on file    Non-medical: Not on file  Tobacco Use  . Smoking status: Never Smoker  . Smokeless tobacco: Never Used  Substance and Sexual Activity  . Alcohol use: No  . Drug use: No  . Sexual activity: Not Currently  Lifestyle  . Physical activity:    Days per week: Not on file    Minutes per session: Not on file  . Stress: Not on file  Relationships  . Social connections:    Talks on phone: Not on file    Gets together: Not on file    Attends religious service: Not on file    Active member of club or organization: Not on file    Attends meetings of clubs or organizations: Not on file    Relationship status: Not on file  Other Topics Concern  . Not on file  Social History Narrative  . Not on file    Allergies:  Allergies  Allergen Reactions  . Amoxicillin Hives and Swelling    LIP SWELLING PATIENT HAS HAD A PCN REACTION WITH IMMEDIATE RASH, FACIAL/TONGUE/THROAT SWELLING, SOB, OR LIGHTHEADEDNESS WITH HYPOTENSION:   #  #  #  YES  #  #  #   HAS PT DEVELOPED SEVERE RASH INVOLVING MUCUS MEMBRANES or SKIN NECROSIS: #  #  #  YES  #  #  #  Has patient had a PCN reaction that required hospitalization: No Has patient had a PCN reaction occurring within the last 10 years: Unknown If all of the above answers are "NO", then may proceed with Cephalosporin use.   . Avelox [Moxifloxacin Hcl In Nacl] Other (See Comments)    Caused pain in shoulders to finger, tendonitis  . Moxifloxacin Other (See Comments)    Caused pain in shoulders to finger, tendonitis  . Adhesive [Tape] Dermatitis    Blisters skin  . Bextra [Valdecoxib] Hives and Swelling    SWELLING REACTION UNSPECIFIED   . Doxycycline Other (See Comments)  . Doxycycline Hyclate Hives and Swelling    SWELLING REACTION UNSPECIFIED   . Gadobutrol Hives  . Gadolinium Derivatives Hives  . Gadoversetamide Hives  . Iodinated Diagnostic Agents Hives  . Lamictal [Lamotrigine] Hives  . Naproxen Hypertension  . Oxycodone-Acetaminophen Other (See Comments)  . Percolone [Oxycodone] Hives  . Relafen [Nabumetone] Hypertension  . Risperdal [Risperidone] Other (See Comments)    Leg weakness   . Fetzima [Levomilnacipran] Other (See Comments)    Tremors across head   . Nitrofurantoin Other (See Comments)    UNSPECIFIED REACTION   . Brintellix [Vortioxetine] Nausea And Vomiting    Metabolic Disorder Labs: No results found for: HGBA1C, MPG Lab Results  Component Value Date   PROLACTIN 13.1 06/09/2017   No results found for: CHOL, TRIG, HDL, CHOLHDL, VLDL, LDLCALC No results found for: TSH  Therapeutic Level Labs: No results found for: LITHIUM No results found for: VALPROATE No components found for:  CBMZ  Current Medications: Current Outpatient Medications  Medication Sig Dispense Refill  . Acetaminophen (TYLENOL PO) Take 1 tablet by mouth daily as needed (PAIN).    Marland Kitchen albuterol (PROVENTIL) (2.5 MG/3ML) 0.083% nebulizer solution PLACE 1 VIAL IN NEBULIZER  EVERY 6 HOURS AS NEEDED FOR WHEEZING UP TO 30 DAYS  5  . Albuterol Sulfate (PROAIR RESPICLICK) 784 (90 Base) MCG/ACT AEPB Inhale 2 Doses into the lungs as needed (every four to six hours for cough or wheeze). 1 each 1  . allopurinol (ZYLOPRIM) 100 MG tablet Take by mouth.    . ARIPiprazole (ABILIFY) 30 MG tablet Take 1 tablet (30 mg total) by mouth daily. 90 tablet 0  . aspirin 81 MG  tablet Take 81 mg by mouth daily.    Marland Kitchen azithromycin (ZITHROMAX) 250 MG tablet TAKE ONE TABLET 3 TIMES WEEKLY 12 tablet 5  . beclomethasone (QVAR REDIHALER) 80 MCG/ACT inhaler Inhale 2 puffs into the lungs 2 (two) times daily.    . budesonide-formoterol (SYMBICORT) 160-4.5 MCG/ACT inhaler Inhale two puffs using spacer twice daily to prevent cough or wheeze.  Rinse, gargle, and spit after use. 1 Inhaler 5  . buPROPion (WELLBUTRIN XL) 300 MG 24 hr tablet TAKE 1 TABLET (300 MG TOTAL) BY MOUTH EVERY MORNING. 90 tablet 0  . Cholecalciferol (VITAMIN D-3) 5000 UNITS TABS Take 5,000 Units by mouth daily.     . clotrimazole-betamethasone (LOTRISONE) cream APPLY SPARINGLY TO THE AFFECTED AREA(S) TWICE DAILY AS NEEDED FOR IRRITATION  5  . hydrOXYzine (ATARAX/VISTARIL) 10 MG tablet Take 1 tablet (10 mg total) by mouth 2 (two) times daily as needed. 180 tablet 0  . ketoconazole (NIZORAL) 2 % shampoo Apply 1 application topically as needed for irritation.     Marland Kitchen loratadine (CLARITIN) 10 MG tablet Take 10 mg by mouth at bedtime.     Marland Kitchen lubiprostone (AMITIZA) 24 MCG capsule Take 24 mcg by mouth daily as needed for constipation.     . meloxicam (MOBIC) 7.5 MG tablet Take 7.5 mg by mouth 2 (two) times daily.  0  . metoprolol succinate (TOPROL-XL) 25 MG 24 hr tablet Take by mouth.    . mupirocin ointment (BACTROBAN) 2 % Place 1 application into the nose 2 (two) times daily as needed (IRRITATION).     Marland Kitchen ondansetron (ZOFRAN-ODT) 4 MG disintegrating tablet Take 4 mg by mouth 2 (two) times daily as needed for nausea or vomiting.    .  pregabalin (LYRICA) 75 MG capsule Take 75 mg by mouth 2 (two) times daily. Reported on 10/09/2015    . RABEprazole (ACIPHEX) 20 MG tablet Take 20 mg by mouth daily.    . ranitidine (ZANTAC) 300 MG tablet Take 300 mg by mouth at bedtime.    Marland Kitchen spironolactone (ALDACTONE) 25 MG tablet Take 25 mg by mouth daily.    Marland Kitchen torsemide (DEMADEX) 20 MG tablet Take 20 mg by mouth daily.    . traMADol (ULTRAM) 50 MG tablet Take 50 mg by mouth.    . triamcinolone cream (KENALOG) 0.1 % Apply 1 application topically 2 (two) times daily as needed (IRRITATION).     Marland Kitchen triamcinolone ointment (KENALOG) 0.5 % Apply 1 application topically 2 (two) times daily as needed (IRRITATION).     Marland Kitchen vitamin B-12 (CYANOCOBALAMIN) 1000 MCG tablet Take 1,000 mcg by mouth daily.     No current facility-administered medications for this visit.      Musculoskeletal: Strength & Muscle Tone: within normal limits Gait & Station: unsteady, walking with cane Patient leans: N/A  Psychiatric Specialty Exam: Review of Systems  HENT: Negative for congestion, sinus pain and sore throat.   Respiratory: Negative for cough, shortness of breath and wheezing.     Blood pressure 128/80, pulse 88, height 5' 9.5" (1.765 m), weight 263 lb (119.3 kg).Body mass index is 38.28 kg/m.  General Appearance: Fairly Groomed  Eye Contact:  Good  Speech:  Clear and Coherent and Normal Rate  Volume:  Normal  Mood:  Euthymic  Affect:  Full Range and brighter, calmer and more engaged than previous visits  Thought Process:  Goal Directed and Descriptions of Associations: Intact  Orientation:  Full (Time, Place, and Person)  Thought Content: Logical   Suicidal Thoughts:  No  Homicidal Thoughts:  No  Memory:  Immediate;   Good Recent;   Good Remote;   Good  Judgement:  Good  Insight:  Good  Psychomotor Activity:  Normal  Concentration:  Concentration: Good and Attention Span: Good  Recall:  Good  Fund of Knowledge: Good  Language: Good  Akathisia:   No  Handed:  Right  AIMS (if indicated): not done  Assets:  Communication Skills Desire for Improvement Housing  ADL's:  Intact  Cognition: WNL  Sleep:  Fair   Screenings: PHQ2-9     Patient Outreach Telephone from 06/02/2017 in [facility] Patient Outreach Telephone from 03/10/2017 in [place] [facility]  PHQ-2 Total Score  0  0      I reviewed the information below on 01/21/2018 and agree except where noted/changed Assessment and Plan: MDD-recurrent, moderate; Tourette's disorder; anxiety disorder unspecified    Medication management with supportive therapy. Risks and benefits, side effects and alternative treatment options discussed with patient. Pt was given an opportunity to ask questions about medication, illness, and treatment. All current psychiatric medications have been reviewed and discussed with the patient and adjusted as clinically appropriate. The patient has been provided an accurate and updated list of the medications being now prescribed. Patient expressed understanding of how their medications were to be used.  Pt verbalized understanding and verbal consent obtained for treatment.  The risk of un-intended pregnancy is high based on the fact that pt reports she is not using any contraceptive. Pt denies current sexual activity. Pt is aware that these meds carry a teratogenic risk. Pt will discuss plan of action if she does or plans to become pregnant in the future.  Status of current problems: stable  Meds: Abilify 30 mg p.o. daily for MDD and Tourette's Wellbutrin XL 300 mg p.o. daily for MDD.  Higher doses caused hypertension in the past Vistaril 10 mg p.o. 2 times daily as needed anxiety   Labs: [facility] 12/03/17 HbA1c 5.6  Therapy: brief supportive therapy provided. Discussed psychosocial stressors in detail.   Encouraged pt to develop daily routine and work on daily goal setting as a way to improve mood symptoms.    Consultations: Encouraged to follow up with therapist Encouraged to follow up with PCP as needed  Pt denies SI and is at an acute low risk for suicide. Patient told to call clinic if any problems occur. Patient advised to go to ER if they should develop SI/HI, side effects, or if symptoms worsen. Has crisis numbers to call if needed. Pt verbalized understanding.  F/up in 3 months or sooner if needed  Charlcie Cradle, MD 01/21/2018, 3:12 PM

## 2018-01-21 NOTE — Telephone Encounter (Signed)
Talked with patient; she is having hoarseness and loses her voice daily.  She is having drainage in her throat.  She has been using her spacer with the Symbicort and is rinsing out her mouth after using it.  She is taking her regular medications as prescribed and has also been using Claritin and some Mucinex.  Did finish up a short course of Prednisone today for a foot issue.  Please advise.

## 2018-01-25 DIAGNOSIS — J310 Chronic rhinitis: Secondary | ICD-10-CM | POA: Diagnosis not present

## 2018-01-25 DIAGNOSIS — R5383 Other fatigue: Secondary | ICD-10-CM | POA: Diagnosis not present

## 2018-01-25 DIAGNOSIS — R5381 Other malaise: Secondary | ICD-10-CM | POA: Diagnosis not present

## 2018-01-26 DIAGNOSIS — R2689 Other abnormalities of gait and mobility: Secondary | ICD-10-CM | POA: Diagnosis not present

## 2018-01-26 DIAGNOSIS — M5440 Lumbago with sciatica, unspecified side: Secondary | ICD-10-CM | POA: Diagnosis not present

## 2018-01-26 DIAGNOSIS — M6281 Muscle weakness (generalized): Secondary | ICD-10-CM | POA: Diagnosis not present

## 2018-01-26 DIAGNOSIS — M545 Low back pain: Secondary | ICD-10-CM | POA: Diagnosis not present

## 2018-01-27 DIAGNOSIS — S92352A Displaced fracture of fifth metatarsal bone, left foot, initial encounter for closed fracture: Secondary | ICD-10-CM | POA: Diagnosis not present

## 2018-01-29 DIAGNOSIS — M6281 Muscle weakness (generalized): Secondary | ICD-10-CM | POA: Diagnosis not present

## 2018-01-29 DIAGNOSIS — M545 Low back pain: Secondary | ICD-10-CM | POA: Diagnosis not present

## 2018-01-29 DIAGNOSIS — M5440 Lumbago with sciatica, unspecified side: Secondary | ICD-10-CM | POA: Diagnosis not present

## 2018-01-29 DIAGNOSIS — R2689 Other abnormalities of gait and mobility: Secondary | ICD-10-CM | POA: Diagnosis not present

## 2018-02-02 DIAGNOSIS — M5126 Other intervertebral disc displacement, lumbar region: Secondary | ICD-10-CM | POA: Diagnosis not present

## 2018-02-02 DIAGNOSIS — M542 Cervicalgia: Secondary | ICD-10-CM | POA: Diagnosis not present

## 2018-02-04 DIAGNOSIS — I10 Essential (primary) hypertension: Secondary | ICD-10-CM | POA: Diagnosis not present

## 2018-02-04 DIAGNOSIS — F419 Anxiety disorder, unspecified: Secondary | ICD-10-CM | POA: Diagnosis not present

## 2018-02-04 DIAGNOSIS — J310 Chronic rhinitis: Secondary | ICD-10-CM | POA: Diagnosis not present

## 2018-02-06 DIAGNOSIS — M542 Cervicalgia: Secondary | ICD-10-CM | POA: Diagnosis not present

## 2018-02-10 DIAGNOSIS — S92352A Displaced fracture of fifth metatarsal bone, left foot, initial encounter for closed fracture: Secondary | ICD-10-CM | POA: Diagnosis not present

## 2018-02-18 ENCOUNTER — Encounter: Payer: Self-pay | Admitting: Allergy and Immunology

## 2018-02-18 ENCOUNTER — Ambulatory Visit (INDEPENDENT_AMBULATORY_CARE_PROVIDER_SITE_OTHER): Payer: PPO | Admitting: Allergy and Immunology

## 2018-02-18 VITALS — BP 148/84 | HR 92 | Resp 18

## 2018-02-18 DIAGNOSIS — H6981 Other specified disorders of Eustachian tube, right ear: Secondary | ICD-10-CM | POA: Diagnosis not present

## 2018-02-18 DIAGNOSIS — J3089 Other allergic rhinitis: Secondary | ICD-10-CM

## 2018-02-18 DIAGNOSIS — K219 Gastro-esophageal reflux disease without esophagitis: Secondary | ICD-10-CM

## 2018-02-18 DIAGNOSIS — J4540 Moderate persistent asthma, uncomplicated: Secondary | ICD-10-CM | POA: Diagnosis not present

## 2018-02-18 MED ORDER — BUDESONIDE-FORMOTEROL FUMARATE 80-4.5 MCG/ACT IN AERO: INHALATION_SPRAY | RESPIRATORY_TRACT | 5 refills | Status: DC

## 2018-02-18 NOTE — Patient Instructions (Addendum)
  1. Continue Symbicort 80 - 2 inhalations 2 times a day with spacer  2. Use OTC Nasacort one spray each nostril 3-7 times per week during upper airway symptoms  3. Continue montelukast 10mg  one tablet one time per day  4. Continue AcipHex 20 mg in the morning and ranitidine 300 mg in the evening  5. Continue ProAir HFA or similar and antihistamine and nasal ipratropium if needed  6. Return to clinic in 6 months or earlier if problem  7. Obtain fall flu vaccine

## 2018-02-18 NOTE — Progress Notes (Signed)
Follow-up Note  Referring Provider: Raina Mina., MD Primary Provider: Raina Mina., MD Date of Office Visit: 02/18/2018  Subjective:   Penny Porter (DOB: [birth year]) is a 52 y.o. female who returns to the [facility] on 02/18/2018 in re-evaluation of the following:  HPI: Penny Porter returns to this clinic in reevaluation of her asthma and allergic rhinitis and LPR.  I last saw her in this clinic on 14 January 2018 at which point in time we gave her a combination inhaler to replace her inhaled steroid.  She has noticed improvement while consistently using her Symbicort.  Initially she was on Symbicort 160 but because of the development of hoarseness we had to decrease this medication to Symbicort 80.  Her hoarseness resolved and her shortness of breath is much better and she does not have a cough and she does not need to use a short acting bronchodilator greater than twice a week.  She still has some chronic stuffiness of her nose especially if she gets around strong scents or perfumes.  She has been using Nasacort on a pretty regular basis.  Her reflux is under pretty good control at this point time on her current therapy as is the issue with her throat.  She has been having some funny feelings in her right ear as though it is intermittently closing u and developing  "numbness" on an intermittent basis for which she saw her primary care doctor and was treated with Singulair and nasal Atrovent.  This issue is somewhat better while utilizing this plan.  She has no associated hearing loss or vertigo.  Allergies as of 02/18/2018      Reactions   Amoxicillin Hives, Swelling   LIP SWELLING PATIENT HAS HAD A PCN REACTION WITH IMMEDIATE RASH, FACIAL/TONGUE/THROAT SWELLING, SOB, OR LIGHTHEADEDNESS WITH HYPOTENSION:  #  #  #  YES  #  #  #   HAS PT DEVELOPED SEVERE RASH INVOLVING MUCUS MEMBRANES or SKIN NECROSIS: #  #  #  YES  #  #  #  Has patient had a PCN reaction that required  hospitalization: No Has patient had a PCN reaction occurring within the last 10 years: Unknown If all of the above answers are "NO", then may proceed with Cephalosporin use.   Avelox [moxifloxacin Hcl In Nacl] Other (See Comments)   Caused pain in shoulders to finger, tendonitis   Moxifloxacin Other (See Comments)   Caused pain in shoulders to finger, tendonitis   Adhesive [tape] Dermatitis   Blisters skin   Bextra [valdecoxib] Hives, Swelling   SWELLING REACTION UNSPECIFIED    Doxycycline Other (See Comments)   Doxycycline Hyclate Hives, Swelling   SWELLING REACTION UNSPECIFIED    Gadobutrol Hives   Gadolinium Derivatives Hives   Gadoversetamide Hives   Iodinated Diagnostic Agents Hives   Lamictal [lamotrigine] Hives   Naproxen Hypertension   Oxycodone-acetaminophen Other (See Comments)   Percolone [oxycodone] Hives   Relafen [nabumetone] Hypertension   Risperdal [risperidone] Other (See Comments)   Leg weakness   Fetzima [levomilnacipran] Other (See Comments)   Tremors across head   Nitrofurantoin Other (See Comments)   UNSPECIFIED REACTION    Brintellix [vortioxetine] Nausea And Vomiting      Medication List      albuterol (2.5 MG/3ML) 0.083% nebulizer solution Commonly known as:  PROVENTIL PLACE 1 VIAL IN NEBULIZER EVERY 6 HOURS AS NEEDED FOR WHEEZING UP TO 30 DAYS   Albuterol Sulfate 108 (90 Base) MCG/ACT Aepb  Commonly known as:  PROAIR RESPICLICK Inhale 2 Doses into the lungs as needed (every four to six hours for cough or wheeze).   allopurinol 100 MG tablet Commonly known as:  ZYLOPRIM Take by mouth.   ARIPiprazole 30 MG tablet Commonly known as:  ABILIFY Take 1 tablet (30 mg total) by mouth daily.   aspirin 81 MG tablet Take 81 mg by mouth daily.   azithromycin 250 MG tablet Commonly known as:  ZITHROMAX TAKE ONE TABLET 3 TIMES WEEKLY   budesonide-formoterol 160-4.5 MCG/ACT inhaler Commonly known as:  SYMBICORT Inhale two puffs using spacer twice  daily to prevent cough or wheeze.  Rinse, gargle, and spit after use.   buPROPion 300 MG 24 hr tablet Commonly known as:  WELLBUTRIN XL TAKE 1 TABLET (300 MG TOTAL) BY MOUTH EVERY MORNING.   clotrimazole-betamethasone cream Commonly known as:  LOTRISONE APPLY SPARINGLY TO THE AFFECTED AREA(S) TWICE DAILY AS NEEDED FOR IRRITATION   hydrOXYzine 10 MG tablet Commonly known as:  ATARAX/VISTARIL Take 1 tablet (10 mg total) by mouth 2 (two) times daily as needed.   ipratropium 0.03 % nasal spray Commonly known as:  ATROVENT USE 2 SPRAYS IN NOSTRIL (S) 2 TIMES DAILY AS DIRECTED   ketoconazole 2 % shampoo Commonly known as:  NIZORAL Apply 1 application topically as needed for irritation.   loratadine 10 MG tablet Commonly known as:  CLARITIN Take 10 mg by mouth at bedtime.   lubiprostone 24 MCG capsule Commonly known as:  AMITIZA Take 24 mcg by mouth daily as needed for constipation.   meloxicam 7.5 MG tablet Commonly known as:  MOBIC Take 7.5 mg by mouth 2 (two) times daily.   metoprolol succinate 25 MG 24 hr tablet Commonly known as:  TOPROL-XL Take by mouth.   montelukast 10 MG tablet Commonly known as:  SINGULAIR Take 10 mg by mouth daily.   mupirocin ointment 2 % Commonly known as:  BACTROBAN Place 1 application into the nose 2 (two) times daily as needed (IRRITATION).   ondansetron 4 MG disintegrating tablet Commonly known as:  ZOFRAN-ODT Take 4 mg by mouth 2 (two) times daily as needed for nausea or vomiting.   pregabalin 75 MG capsule Commonly known as:  LYRICA Take 75 mg by mouth 2 (two) times daily. Reported on 10/09/2015   QVAR REDIHALER 80 MCG/ACT inhaler Generic drug:  beclomethasone Inhale 2 puffs into the lungs 2 (two) times daily.   RABEprazole 20 MG tablet Commonly known as:  ACIPHEX Take 20 mg by mouth daily.   ranitidine 300 MG tablet Commonly known as:  ZANTAC Take 300 mg by mouth at bedtime.   spironolactone 25 MG tablet Commonly known  as:  ALDACTONE Take 25 mg by mouth daily.   torsemide 20 MG tablet Commonly known as:  DEMADEX Take 20 mg by mouth daily.   traMADol 50 MG tablet Commonly known as:  ULTRAM Take 50 mg by mouth.   triamcinolone cream 0.1 % Commonly known as:  KENALOG Apply 1 application topically 2 (two) times daily as needed (IRRITATION).   triamcinolone ointment 0.5 % Commonly known as:  KENALOG Apply 1 application topically 2 (two) times daily as needed (IRRITATION).   TYLENOL PO Take 1 tablet by mouth daily as needed (PAIN).   vitamin B-12 1000 MCG tablet Commonly known as:  CYANOCOBALAMIN Take 1,000 mcg by mouth daily.   Vitamin D-3 5000 units Tabs Take 5,000 Units by mouth daily.       Past Medical History:  Diagnosis Date  . Anxiety   . Arthritis    Back and hip  . Asthma   . Bursitis   . Complication of anesthesia    bladder hard to wake up, never needed a in and out cath  . Costochondral chest pain   . Degenerative disk disease   . Depression   . Gastroparesis   . GERD (gastroesophageal reflux disease)   . Hypertension   . Lumbar herniated disc   . Sleep apnea    not using CPAP  . Stenosis of lumbosacral spine   . Stress fracture of foot   . Thyroid nodule   . Tourette disease   . Trigeminal nerve disease     Past Surgical History:  Procedure Laterality Date  . BACK SURGERY  September 19, 2015  . bone spur removal on back    . CHOLECYSTECTOMY  2005  . COLONOSCOPY    . ELBOW SURGERY Left May 29, 2014  . ESOPHAGOGASTRODUODENOSCOPY    . KNEE SURGERY  06/2016  . polyp removal    . POSTERIOR FUSION LUMBAR SPINE  06/26/2017  . ruptured disk  2008  . TEAR DUCT PROBING  2009    Review of systems negative except as noted in HPI / PMHx or noted below:  Review of Systems  Constitutional: Negative.   HENT: Negative.   Eyes: Negative.   Respiratory: Negative.   Cardiovascular: Negative.   Gastrointestinal: Negative.   Genitourinary: Negative.     Musculoskeletal: Negative.   Skin: Negative.   Neurological: Negative.   Endo/Heme/Allergies: Negative.   Psychiatric/Behavioral: Negative.      Objective:   Vitals:   02/18/18 1114  BP: (!) 148/84  Pulse: 92  Resp: 18          Physical Exam  HENT:  Head: Normocephalic.  Right Ear: Tympanic membrane, external ear and ear canal normal.  Left Ear: Tympanic membrane, external ear and ear canal normal.  Nose: Nose normal. No mucosal edema or rhinorrhea.  Mouth/Throat: Uvula is midline, oropharynx is clear and moist and mucous membranes are normal. No oropharyngeal exudate.  Eyes: Conjunctivae are normal.  Neck: Trachea normal. No tracheal tenderness present. No tracheal deviation present. No thyromegaly present.  Cardiovascular: Normal rate, regular rhythm, S1 normal, S2 normal and normal heart sounds.  No murmur heard. Pulmonary/Chest: Breath sounds normal. No stridor. No respiratory distress. She has no wheezes. She has no rales.  Musculoskeletal: She exhibits no edema.  Lymphadenopathy:       Head (right side): No tonsillar adenopathy present.       Head (left side): No tonsillar adenopathy present.    She has no cervical adenopathy.  Neurological: She is alert.  Skin: No rash noted. She is not diaphoretic. No erythema. Nails show no clubbing.    Diagnostics:    Spirometry was performed and demonstrated an FEV1 of 1.92 at 57 % of predicted.  She had a less than optimal effort on the spirometric maneuver.  The patient had an Asthma Control Test with the following results: ACT Total Score: 20.    Assessment and Plan:   1. Asthma, moderate persistent, well-controlled   2. Other allergic rhinitis   3. LPRD (laryngopharyngeal reflux disease)   4. Dysfunction of right eustachian tube     1. Continue Symbicort 80 - 2 inhalations 2 times a day with spacer  2. Use OTC Nasacort one spray each nostril 3-7 times per week during upper airway symptoms  3. Continue  montelukast  10mg  one tablet one time per day  4. Continue AcipHex 20 mg in the morning and ranitidine 300 mg in the evening  5. Continue ProAir HFA or similar and antihistamine and nasal ipratropium if needed  6. Return to clinic in 6 months or earlier if problem  7. Obtain fall flu vaccine  I think that [place] is doing better at this point in time with her airway issue and she will continue on a large collection of anti-inflammatory medications for her airway as noted above.  As well, her reflux induced respiratory disease appears to be very good control on her current therapy.  She does have some ETD on the right and I told her that should she develop significant problems with hearing loss or vertigo she will probably need to revisit with her ENT doctor for this issue.  I will see her back in this clinic in 6 months or earlier if there is a problem.  Allena Katz, MD Allergy / Immunology [place]

## 2018-02-22 ENCOUNTER — Encounter: Payer: Self-pay | Admitting: Allergy and Immunology

## 2018-02-22 DIAGNOSIS — S92352A Displaced fracture of fifth metatarsal bone, left foot, initial encounter for closed fracture: Secondary | ICD-10-CM | POA: Diagnosis not present

## 2018-02-23 DIAGNOSIS — M542 Cervicalgia: Secondary | ICD-10-CM | POA: Diagnosis not present

## 2018-02-25 DIAGNOSIS — S92352A Displaced fracture of fifth metatarsal bone, left foot, initial encounter for closed fracture: Secondary | ICD-10-CM | POA: Diagnosis not present

## 2018-02-25 DIAGNOSIS — I499 Cardiac arrhythmia, unspecified: Secondary | ICD-10-CM | POA: Diagnosis not present

## 2018-02-25 DIAGNOSIS — Q798 Other congenital malformations of musculoskeletal system: Secondary | ICD-10-CM | POA: Diagnosis not present

## 2018-02-25 DIAGNOSIS — M79662 Pain in left lower leg: Secondary | ICD-10-CM | POA: Diagnosis not present

## 2018-02-25 DIAGNOSIS — M7732 Calcaneal spur, left foot: Secondary | ICD-10-CM | POA: Diagnosis not present

## 2018-02-26 DIAGNOSIS — M2560 Stiffness of unspecified joint, not elsewhere classified: Secondary | ICD-10-CM | POA: Diagnosis not present

## 2018-02-26 DIAGNOSIS — M542 Cervicalgia: Secondary | ICD-10-CM | POA: Diagnosis not present

## 2018-02-26 DIAGNOSIS — M6281 Muscle weakness (generalized): Secondary | ICD-10-CM | POA: Diagnosis not present

## 2018-03-01 ENCOUNTER — Ambulatory Visit: Payer: PPO | Admitting: Allergy and Immunology

## 2018-03-01 DIAGNOSIS — S92352A Displaced fracture of fifth metatarsal bone, left foot, initial encounter for closed fracture: Secondary | ICD-10-CM | POA: Diagnosis not present

## 2018-03-02 ENCOUNTER — Other Ambulatory Visit: Payer: Self-pay | Admitting: Allergy and Immunology

## 2018-03-02 DIAGNOSIS — M6281 Muscle weakness (generalized): Secondary | ICD-10-CM | POA: Diagnosis not present

## 2018-03-02 DIAGNOSIS — M2560 Stiffness of unspecified joint, not elsewhere classified: Secondary | ICD-10-CM | POA: Diagnosis not present

## 2018-03-02 DIAGNOSIS — M542 Cervicalgia: Secondary | ICD-10-CM | POA: Diagnosis not present

## 2018-03-05 DIAGNOSIS — M542 Cervicalgia: Secondary | ICD-10-CM | POA: Diagnosis not present

## 2018-03-05 DIAGNOSIS — M6281 Muscle weakness (generalized): Secondary | ICD-10-CM | POA: Diagnosis not present

## 2018-03-05 DIAGNOSIS — M2560 Stiffness of unspecified joint, not elsewhere classified: Secondary | ICD-10-CM | POA: Diagnosis not present

## 2018-03-09 DIAGNOSIS — M2560 Stiffness of unspecified joint, not elsewhere classified: Secondary | ICD-10-CM | POA: Diagnosis not present

## 2018-03-09 DIAGNOSIS — M6281 Muscle weakness (generalized): Secondary | ICD-10-CM | POA: Diagnosis not present

## 2018-03-09 DIAGNOSIS — M542 Cervicalgia: Secondary | ICD-10-CM | POA: Diagnosis not present

## 2018-03-10 DIAGNOSIS — Z1231 Encounter for screening mammogram for malignant neoplasm of breast: Secondary | ICD-10-CM | POA: Diagnosis not present

## 2018-03-10 DIAGNOSIS — Z01419 Encounter for gynecological examination (general) (routine) without abnormal findings: Secondary | ICD-10-CM | POA: Diagnosis not present

## 2018-03-12 DIAGNOSIS — M6281 Muscle weakness (generalized): Secondary | ICD-10-CM | POA: Diagnosis not present

## 2018-03-12 DIAGNOSIS — M542 Cervicalgia: Secondary | ICD-10-CM | POA: Diagnosis not present

## 2018-03-12 DIAGNOSIS — M2560 Stiffness of unspecified joint, not elsewhere classified: Secondary | ICD-10-CM | POA: Diagnosis not present

## 2018-03-15 DIAGNOSIS — M2560 Stiffness of unspecified joint, not elsewhere classified: Secondary | ICD-10-CM | POA: Diagnosis not present

## 2018-03-15 DIAGNOSIS — M6281 Muscle weakness (generalized): Secondary | ICD-10-CM | POA: Diagnosis not present

## 2018-03-15 DIAGNOSIS — M542 Cervicalgia: Secondary | ICD-10-CM | POA: Diagnosis not present

## 2018-03-18 DIAGNOSIS — M2560 Stiffness of unspecified joint, not elsewhere classified: Secondary | ICD-10-CM | POA: Diagnosis not present

## 2018-03-18 DIAGNOSIS — M6281 Muscle weakness (generalized): Secondary | ICD-10-CM | POA: Diagnosis not present

## 2018-03-18 DIAGNOSIS — M542 Cervicalgia: Secondary | ICD-10-CM | POA: Diagnosis not present

## 2018-03-22 DIAGNOSIS — M2560 Stiffness of unspecified joint, not elsewhere classified: Secondary | ICD-10-CM | POA: Diagnosis not present

## 2018-03-22 DIAGNOSIS — M6281 Muscle weakness (generalized): Secondary | ICD-10-CM | POA: Diagnosis not present

## 2018-03-22 DIAGNOSIS — M542 Cervicalgia: Secondary | ICD-10-CM | POA: Diagnosis not present

## 2018-03-22 DIAGNOSIS — Z1231 Encounter for screening mammogram for malignant neoplasm of breast: Secondary | ICD-10-CM | POA: Diagnosis not present

## 2018-03-25 DIAGNOSIS — M6281 Muscle weakness (generalized): Secondary | ICD-10-CM | POA: Diagnosis not present

## 2018-03-25 DIAGNOSIS — M2560 Stiffness of unspecified joint, not elsewhere classified: Secondary | ICD-10-CM | POA: Diagnosis not present

## 2018-03-25 DIAGNOSIS — M542 Cervicalgia: Secondary | ICD-10-CM | POA: Diagnosis not present

## 2018-03-30 DIAGNOSIS — F411 Generalized anxiety disorder: Secondary | ICD-10-CM | POA: Diagnosis not present

## 2018-03-30 DIAGNOSIS — M542 Cervicalgia: Secondary | ICD-10-CM | POA: Diagnosis not present

## 2018-03-30 DIAGNOSIS — M2560 Stiffness of unspecified joint, not elsewhere classified: Secondary | ICD-10-CM | POA: Diagnosis not present

## 2018-03-30 DIAGNOSIS — F321 Major depressive disorder, single episode, moderate: Secondary | ICD-10-CM | POA: Diagnosis not present

## 2018-03-30 DIAGNOSIS — M6281 Muscle weakness (generalized): Secondary | ICD-10-CM | POA: Diagnosis not present

## 2018-04-01 DIAGNOSIS — I10 Essential (primary) hypertension: Secondary | ICD-10-CM | POA: Diagnosis not present

## 2018-04-01 DIAGNOSIS — Z6839 Body mass index (BMI) 39.0-39.9, adult: Secondary | ICD-10-CM | POA: Diagnosis not present

## 2018-04-01 DIAGNOSIS — M542 Cervicalgia: Secondary | ICD-10-CM | POA: Diagnosis not present

## 2018-04-01 DIAGNOSIS — S92352A Displaced fracture of fifth metatarsal bone, left foot, initial encounter for closed fracture: Secondary | ICD-10-CM | POA: Diagnosis not present

## 2018-04-05 DIAGNOSIS — K5909 Other constipation: Secondary | ICD-10-CM | POA: Diagnosis not present

## 2018-04-05 DIAGNOSIS — M5030 Other cervical disc degeneration, unspecified cervical region: Secondary | ICD-10-CM | POA: Diagnosis not present

## 2018-04-05 DIAGNOSIS — E042 Nontoxic multinodular goiter: Secondary | ICD-10-CM | POA: Diagnosis not present

## 2018-04-05 DIAGNOSIS — G4733 Obstructive sleep apnea (adult) (pediatric): Secondary | ICD-10-CM | POA: Diagnosis not present

## 2018-04-05 DIAGNOSIS — R5383 Other fatigue: Secondary | ICD-10-CM | POA: Diagnosis not present

## 2018-04-05 DIAGNOSIS — M5126 Other intervertebral disc displacement, lumbar region: Secondary | ICD-10-CM | POA: Diagnosis not present

## 2018-04-05 DIAGNOSIS — Z0000 Encounter for general adult medical examination without abnormal findings: Secondary | ICD-10-CM | POA: Diagnosis not present

## 2018-04-05 DIAGNOSIS — Z79899 Other long term (current) drug therapy: Secondary | ICD-10-CM | POA: Diagnosis not present

## 2018-04-05 DIAGNOSIS — I10 Essential (primary) hypertension: Secondary | ICD-10-CM | POA: Diagnosis not present

## 2018-04-05 DIAGNOSIS — I878 Other specified disorders of veins: Secondary | ICD-10-CM | POA: Diagnosis not present

## 2018-04-05 DIAGNOSIS — J310 Chronic rhinitis: Secondary | ICD-10-CM | POA: Diagnosis not present

## 2018-04-05 DIAGNOSIS — E782 Mixed hyperlipidemia: Secondary | ICD-10-CM | POA: Diagnosis not present

## 2018-04-05 DIAGNOSIS — G500 Trigeminal neuralgia: Secondary | ICD-10-CM | POA: Diagnosis not present

## 2018-04-05 DIAGNOSIS — E559 Vitamin D deficiency, unspecified: Secondary | ICD-10-CM | POA: Diagnosis not present

## 2018-04-05 DIAGNOSIS — M150 Primary generalized (osteo)arthritis: Secondary | ICD-10-CM | POA: Diagnosis not present

## 2018-04-05 DIAGNOSIS — J4540 Moderate persistent asthma, uncomplicated: Secondary | ICD-10-CM | POA: Diagnosis not present

## 2018-04-05 DIAGNOSIS — R5381 Other malaise: Secondary | ICD-10-CM | POA: Diagnosis not present

## 2018-04-05 DIAGNOSIS — F952 Tourette's disorder: Secondary | ICD-10-CM | POA: Diagnosis not present

## 2018-04-09 DIAGNOSIS — R350 Frequency of micturition: Secondary | ICD-10-CM | POA: Diagnosis not present

## 2018-04-18 ENCOUNTER — Other Ambulatory Visit: Payer: Self-pay | Admitting: Allergy and Immunology

## 2018-04-19 DIAGNOSIS — N952 Postmenopausal atrophic vaginitis: Secondary | ICD-10-CM | POA: Diagnosis not present

## 2018-04-19 DIAGNOSIS — N3941 Urge incontinence: Secondary | ICD-10-CM | POA: Diagnosis not present

## 2018-04-19 DIAGNOSIS — Z79899 Other long term (current) drug therapy: Secondary | ICD-10-CM | POA: Diagnosis not present

## 2018-04-21 DIAGNOSIS — S92352G Displaced fracture of fifth metatarsal bone, left foot, subsequent encounter for fracture with delayed healing: Secondary | ICD-10-CM | POA: Diagnosis not present

## 2018-04-27 DIAGNOSIS — F321 Major depressive disorder, single episode, moderate: Secondary | ICD-10-CM | POA: Diagnosis not present

## 2018-04-27 DIAGNOSIS — F411 Generalized anxiety disorder: Secondary | ICD-10-CM | POA: Diagnosis not present

## 2018-04-29 ENCOUNTER — Ambulatory Visit (INDEPENDENT_AMBULATORY_CARE_PROVIDER_SITE_OTHER): Payer: PPO | Admitting: Psychiatry

## 2018-04-29 ENCOUNTER — Encounter (HOSPITAL_COMMUNITY): Payer: Self-pay | Admitting: Psychiatry

## 2018-04-29 DIAGNOSIS — F952 Tourette's disorder: Secondary | ICD-10-CM

## 2018-04-29 DIAGNOSIS — F411 Generalized anxiety disorder: Secondary | ICD-10-CM

## 2018-04-29 DIAGNOSIS — M79672 Pain in left foot: Secondary | ICD-10-CM | POA: Diagnosis not present

## 2018-04-29 DIAGNOSIS — F332 Major depressive disorder, recurrent severe without psychotic features: Secondary | ICD-10-CM

## 2018-04-29 DIAGNOSIS — R609 Edema, unspecified: Secondary | ICD-10-CM | POA: Diagnosis not present

## 2018-04-29 MED ORDER — HYDROXYZINE HCL 10 MG PO TABS: 10.0000 mg | ORAL_TABLET | Freq: Two times a day (BID) | ORAL | 0 refills | Status: DC | PRN

## 2018-04-29 MED ORDER — BUPROPION HCL ER (XL) 300 MG PO TB24: ORAL_TABLET | ORAL | 0 refills | Status: DC

## 2018-04-29 MED ORDER — ARIPIPRAZOLE 30 MG PO TABS: 30.0000 mg | ORAL_TABLET | Freq: Every day | ORAL | 0 refills | Status: DC

## 2018-04-29 NOTE — Progress Notes (Signed)
BH MD/PA/NP OP Progress Note  04/29/2018 10:43 AM Penny Porter  MRN:  [id number]  Chief Complaint:  Chief Complaint    Depression; Anxiety; Follow-up     HPI: Patient reports that her left foot is not healing and she is experiencing significant pain.  She is only able to walk for short periods of time.  It is frustrating and scary that she may need to have surgery again.  She has a lot of support from her sisters.  She notes that when stressed by the pain or lack of ability to do something she experiences some Tourette's symptoms of touching the bridge of her nose repeatedly until she notices or someone tells her to stop.  During this time she also feels sadness for a few minutes or hour.  This happens about 3 times a week.  Patient denies isolation, feeling withdrawn or numb.  Sleep is fair but not of great quality due to ongoing pain.  Patient denies SI/HI.  Her abdominal tics continue.  She has been taking her Vistaril twice a day and notes that since she has not been going out much her anxiety is tolerable and mild at home.    Visit Diagnosis:    ICD-10-CM   1. Major depressive disorder, recurrent, severe without psychotic features ([place]) F33.2 ARIPiprazole (ABILIFY) 30 MG tablet    buPROPion (WELLBUTRIN XL) 300 MG 24 hr tablet  2. Tourette's disease F95.2 ARIPiprazole (ABILIFY) 30 MG tablet  3. Anxiety state F41.1 hydrOXYzine (ATARAX/VISTARIL) 10 MG tablet      Past Psychiatric History:  Anxiety:No Bipolar Disorder:No Depression:Yes Mania:No Psychosis:No Schizophrenia:No Personality Disorder:No Hospitalization for psychiatric illness:No History of Electroconvulsive Shock Therapy:No Prior Suicide Attempts:No    Past Medical History:  Past Medical History:  Diagnosis Date  . Anxiety   . Arthritis    Back and hip  . Asthma   . Bursitis   . Complication of anesthesia    bladder hard to wake up, never needed a in and out cath  . Costochondral chest pain   .  Degenerative disk disease   . Depression   . Gastroparesis   . GERD (gastroesophageal reflux disease)   . Hypertension   . Lumbar herniated disc   . Sleep apnea    not using CPAP  . Stenosis of lumbosacral spine   . Stress fracture of foot   . Thyroid nodule   . Tourette disease   . Trigeminal nerve disease     Past Surgical History:  Procedure Laterality Date  . BACK SURGERY  September 19, 2015  . bone spur removal on back    . CHOLECYSTECTOMY  2005  . COLONOSCOPY    . ELBOW SURGERY Left May 29, 2014  . ESOPHAGOGASTRODUODENOSCOPY    . KNEE SURGERY  06/2016  . polyp removal    . POSTERIOR FUSION LUMBAR SPINE  06/26/2017  . ruptured disk  2008  . TEAR DUCT PROBING  2009    Family Psychiatric History: Family History  Problem Relation Age of Onset  . Mental retardation Sister   . Dementia Father   . Suicidality Neg Hx     Social History:  Social History   Socioeconomic History  . Marital status: Single    Spouse name: Not on file  . Number of children: Not on file  . Years of education: Not on file  . Highest education level: Not on file  Occupational History  . Not on file  Social Needs  . Emergency planning/management officer  strain: Not on file  . Food insecurity:    Worry: Not on file    Inability: Not on file  . Transportation needs:    Medical: Not on file    Non-medical: Not on file  Tobacco Use  . Smoking status: Never Smoker  . Smokeless tobacco: Never Used  Substance and Sexual Activity  . Alcohol use: No  . Drug use: No  . Sexual activity: Not Currently  Lifestyle  . Physical activity:    Days per week: Not on file    Minutes per session: Not on file  . Stress: Not on file  Relationships  . Social connections:    Talks on phone: Not on file    Gets together: Not on file    Attends religious service: Not on file    Active member of club or organization: Not on file    Attends meetings of clubs or organizations: Not on file    Relationship status: Not on  file  Other Topics Concern  . Not on file  Social History Narrative  . Not on file    Allergies:  Allergies  Allergen Reactions  . Amoxicillin Hives and Swelling    LIP SWELLING PATIENT HAS HAD A PCN REACTION WITH IMMEDIATE RASH, FACIAL/TONGUE/THROAT SWELLING, SOB, OR LIGHTHEADEDNESS WITH HYPOTENSION:  #  #  #  YES  #  #  #   HAS PT DEVELOPED SEVERE RASH INVOLVING MUCUS MEMBRANES or SKIN NECROSIS: #  #  #  YES  #  #  #  Has patient had a PCN reaction that required hospitalization: No Has patient had a PCN reaction occurring within the last 10 years: Unknown If all of the above answers are "NO", then may proceed with Cephalosporin use.   . Avelox [Moxifloxacin Hcl In Nacl] Other (See Comments)    Caused pain in shoulders to finger, tendonitis  . Moxifloxacin Other (See Comments)    Caused pain in shoulders to finger, tendonitis  . Adhesive [Tape] Dermatitis    Blisters skin  . Bextra [Valdecoxib] Hives and Swelling    SWELLING REACTION UNSPECIFIED   . Doxycycline Other (See Comments)  . Doxycycline Hyclate Hives and Swelling    SWELLING REACTION UNSPECIFIED   . Gadobutrol Hives  . Gadolinium Derivatives Hives  . Gadoversetamide Hives  . Iodinated Diagnostic Agents Hives  . Lamictal [Lamotrigine] Hives  . Naproxen Hypertension  . Oxycodone-Acetaminophen Other (See Comments)  . Percolone [Oxycodone] Hives  . Relafen [Nabumetone] Hypertension  . Risperdal [Risperidone] Other (See Comments)    Leg weakness   . Fetzima [Levomilnacipran] Other (See Comments)    Tremors across head   . Nitrofurantoin Other (See Comments)    UNSPECIFIED REACTION   . Brintellix [Vortioxetine] Nausea And Vomiting    Metabolic Disorder Labs: No results found for: HGBA1C, MPG Lab Results  Component Value Date   PROLACTIN 13.1 06/09/2017   No results found for: CHOL, TRIG, HDL, CHOLHDL, VLDL, LDLCALC No results found for: TSH  Therapeutic Level Labs: No results found for: LITHIUM No  results found for: VALPROATE No components found for:  CBMZ  Current Medications: Current Outpatient Medications  Medication Sig Dispense Refill  . Acetaminophen (TYLENOL PO) Take 1 tablet by mouth daily as needed (PAIN).    Marland Kitchen albuterol (PROVENTIL) (2.5 MG/3ML) 0.083% nebulizer solution PLACE 1 VIAL IN NEBULIZER EVERY 6 HOURS AS NEEDED FOR WHEEZING UP TO 30 DAYS  5  . Albuterol Sulfate (PROAIR RESPICLICK) 163 (90 Base) MCG/ACT AEPB  Inhale 2 Doses into the lungs as needed (every four to six hours for cough or wheeze). 1 each 1  . allopurinol (ZYLOPRIM) 100 MG tablet Take by mouth.    . ARIPiprazole (ABILIFY) 30 MG tablet Take 1 tablet (30 mg total) by mouth daily. 90 tablet 0  . aspirin 81 MG tablet Take 81 mg by mouth daily.    . beclomethasone (QVAR REDIHALER) 80 MCG/ACT inhaler Inhale 2 puffs into the lungs 2 (two) times daily.    . budesonide-formoterol (SYMBICORT) 80-4.5 MCG/ACT inhaler Inhale two puffs using spacer twice daily to prevent cough or wheeze.  Rinse, gargle, and spit after use. 1 Inhaler 5  . buPROPion (WELLBUTRIN XL) 300 MG 24 hr tablet TAKE 1 TABLET (300 MG TOTAL) BY MOUTH EVERY MORNING. 90 tablet 0  . Cholecalciferol (VITAMIN D-3) 5000 UNITS TABS Take 5,000 Units by mouth daily.     . clotrimazole-betamethasone (LOTRISONE) cream APPLY SPARINGLY TO THE AFFECTED AREA(S) TWICE DAILY AS NEEDED FOR IRRITATION  5  . estradiol (ESTRACE) 0.1 MG/GM vaginal cream Place 1 Applicatorful vaginally at bedtime.    . hydrOXYzine (ATARAX/VISTARIL) 10 MG tablet Take 1 tablet (10 mg total) by mouth 2 (two) times daily as needed. 180 tablet 0  . ipratropium (ATROVENT) 0.03 % nasal spray USE 2 SPRAYS IN NOSTRIL (S) 2 TIMES DAILY AS DIRECTED  11  . ketoconazole (NIZORAL) 2 % shampoo Apply 1 application topically as needed for irritation.     Marland Kitchen loratadine (CLARITIN) 10 MG tablet Take 10 mg by mouth at bedtime.     Marland Kitchen lubiprostone (AMITIZA) 24 MCG capsule Take 24 mcg by mouth daily as needed for  constipation.     . meloxicam (MOBIC) 7.5 MG tablet Take 7.5 mg by mouth 2 (two) times daily.  0  . metoprolol succinate (TOPROL-XL) 25 MG 24 hr tablet Take by mouth.    . montelukast (SINGULAIR) 10 MG tablet Take 10 mg by mouth daily.  5  . mupirocin ointment (BACTROBAN) 2 % Place 1 application into the nose 2 (two) times daily as needed (IRRITATION).     Marland Kitchen ondansetron (ZOFRAN-ODT) 4 MG disintegrating tablet Take 4 mg by mouth 2 (two) times daily as needed for nausea or vomiting.    . pregabalin (LYRICA) 75 MG capsule Take 75 mg by mouth 2 (two) times daily. Reported on 10/09/2015    . RABEprazole (ACIPHEX) 20 MG tablet Take 20 mg by mouth daily.    . ranitidine (ZANTAC) 300 MG tablet Take 300 mg by mouth at bedtime.    Marland Kitchen spironolactone (ALDACTONE) 25 MG tablet Take 25 mg by mouth daily.    Marland Kitchen torsemide (DEMADEX) 20 MG tablet Take 20 mg by mouth daily.    . traMADol (ULTRAM) 50 MG tablet Take 50 mg by mouth.    . triamcinolone cream (KENALOG) 0.1 % Apply 1 application topically 2 (two) times daily as needed (IRRITATION).     Marland Kitchen triamcinolone ointment (KENALOG) 0.5 % Apply 1 application topically 2 (two) times daily as needed (IRRITATION).     Marland Kitchen vitamin B-12 (CYANOCOBALAMIN) 1000 MCG tablet Take 1,000 mcg by mouth daily.     No current facility-administered medications for this visit.      Musculoskeletal: Strength & Muscle Tone: within normal limits Gait & Station: unsteady, walking with cane Patient leans: Front  Psychiatric Specialty Exam: Review of Systems  Constitutional: Negative for chills, diaphoresis and fever.  Musculoskeletal: Positive for back pain, joint pain and myalgias.  Blood pressure 128/74, pulse 88, height 5' 9.5" (1.765 m), weight 267 lb (121.1 kg).Body mass index is 38.86 kg/m.  General Appearance: Fairly Groomed  Eye Contact:  Good  Speech:  Clear and Coherent and Normal Rate  Volume:  Decreased  Mood:  Euthymic  Affect:  Full Range  Thought Process:  Goal  Directed and Descriptions of Associations: Intact  Orientation:  Full (Time, Place, and Person)  Thought Content:  Logical  Suicidal Thoughts:  No  Homicidal Thoughts:  No  Memory:  Immediate;   Good  Judgement:  Good  Insight:  Good  Psychomotor Activity:  Normal  Concentration:  Concentration: Good  Recall:  Good  Fund of Knowledge:  Good  Language:  Good  Akathisia:  No  Handed:  Right  AIMS (if indicated):     Assets:  Communication Skills Desire for Improvement Financial Resources/Insurance Housing Resilience Social Support  ADL's:  Intact  Cognition:  WNL  Sleep:   fair-ish     Screenings: PHQ2-9     Patient Outreach Telephone from 06/02/2017 in [facility] Patient Outreach Telephone from 03/10/2017 in [facility]  PHQ-2 Total Score  0  0      I reviewed the information below on 04/29/2018 and have updated it Assessment and Plan: MDD-recurrent, moderate; Tourette's disorder; anxiety disorder unspecified    Medication management with supportive therapy. Risks and benefits, side effects and alternative treatment options discussed with patient. Pt was given an opportunity to ask questions about medication, illness, and treatment. All current psychiatric medications have been reviewed and discussed with the patient and adjusted as clinically appropriate. The patient has been provided an accurate and updated list of the medications being now prescribed. Patient expressed understanding of how their medications were to be used.  Pt verbalized understanding and verbal consent obtained for treatment.  The risk of un-intended pregnancy is high based on the fact that pt reports she is not using any contraceptive and has irregular periods. Pt denies current sexual activity. Pt is aware that these meds carry a teratogenic risk. Pt will discuss plan of action if she does or plans to become pregnant in the future.  Status of current problems:  stable  Meds: Abilify 30 mg p.o. daily for MDD and Tourette's Wellbutrin XL 300 mg p.o. daily for MDD.  Higher doses caused hypertension in the past Vistaril 10 mg p.o. 2 times daily as needed anxiety   Labs: [facility] 04/05/18 HbA1c 5.3, Vit D WNL, CMP and TSH wnl, triglycerides 254, CBC wbc low,  EKG on 02/25/2018 normal EKG with QTc 433  Therapy: brief supportive therapy provided. Discussed psychosocial stressors in detail.   Encouraged pt to develop daily routine and work on daily goal setting as a way to improve mood symptoms.  Discussed diet and weight loss  Consultations: Encouraged to follow up with therapist Encouraged to follow up with PCP as needed  Pt denies SI and is at an acute low risk for suicide. Patient told to call clinic if any problems occur. Patient advised to go to ER if they should develop SI/HI, side effects, or if symptoms worsen. Has crisis numbers to call if needed. Pt verbalized understanding.  F/up in 3 months or sooner if needed  Charlcie Cradle, MD 04/29/2018, 10:43 AM

## 2018-05-03 DIAGNOSIS — S92352G Displaced fracture of fifth metatarsal bone, left foot, subsequent encounter for fracture with delayed healing: Secondary | ICD-10-CM | POA: Diagnosis not present

## 2018-05-03 DIAGNOSIS — M79672 Pain in left foot: Secondary | ICD-10-CM | POA: Diagnosis not present

## 2018-05-12 ENCOUNTER — Other Ambulatory Visit: Payer: Self-pay | Admitting: Allergy and Immunology

## 2018-05-13 DIAGNOSIS — Z23 Encounter for immunization: Secondary | ICD-10-CM | POA: Diagnosis not present

## 2018-05-27 DIAGNOSIS — F411 Generalized anxiety disorder: Secondary | ICD-10-CM | POA: Diagnosis not present

## 2018-05-27 DIAGNOSIS — F321 Major depressive disorder, single episode, moderate: Secondary | ICD-10-CM | POA: Diagnosis not present

## 2018-05-31 DIAGNOSIS — N952 Postmenopausal atrophic vaginitis: Secondary | ICD-10-CM | POA: Diagnosis not present

## 2018-05-31 DIAGNOSIS — N912 Amenorrhea, unspecified: Secondary | ICD-10-CM | POA: Diagnosis not present

## 2018-05-31 DIAGNOSIS — N3941 Urge incontinence: Secondary | ICD-10-CM | POA: Diagnosis not present

## 2018-06-04 DIAGNOSIS — R000 Tachycardia, unspecified: Secondary | ICD-10-CM | POA: Diagnosis not present

## 2018-06-04 DIAGNOSIS — G4733 Obstructive sleep apnea (adult) (pediatric): Secondary | ICD-10-CM | POA: Diagnosis not present

## 2018-06-04 DIAGNOSIS — I10 Essential (primary) hypertension: Secondary | ICD-10-CM | POA: Diagnosis not present

## 2018-06-07 DIAGNOSIS — R002 Palpitations: Secondary | ICD-10-CM | POA: Diagnosis not present

## 2018-06-17 DIAGNOSIS — M5440 Lumbago with sciatica, unspecified side: Secondary | ICD-10-CM | POA: Diagnosis not present

## 2018-06-24 DIAGNOSIS — F411 Generalized anxiety disorder: Secondary | ICD-10-CM | POA: Diagnosis not present

## 2018-06-24 DIAGNOSIS — M5440 Lumbago with sciatica, unspecified side: Secondary | ICD-10-CM | POA: Diagnosis not present

## 2018-06-24 DIAGNOSIS — F321 Major depressive disorder, single episode, moderate: Secondary | ICD-10-CM | POA: Diagnosis not present

## 2018-06-30 DIAGNOSIS — M5136 Other intervertebral disc degeneration, lumbar region: Secondary | ICD-10-CM | POA: Diagnosis not present

## 2018-06-30 DIAGNOSIS — M48061 Spinal stenosis, lumbar region without neurogenic claudication: Secondary | ICD-10-CM | POA: Diagnosis not present

## 2018-06-30 DIAGNOSIS — M5126 Other intervertebral disc displacement, lumbar region: Secondary | ICD-10-CM | POA: Diagnosis not present

## 2018-06-30 DIAGNOSIS — Z981 Arthrodesis status: Secondary | ICD-10-CM | POA: Diagnosis not present

## 2018-06-30 DIAGNOSIS — M5127 Other intervertebral disc displacement, lumbosacral region: Secondary | ICD-10-CM | POA: Diagnosis not present

## 2018-06-30 DIAGNOSIS — M5440 Lumbago with sciatica, unspecified side: Secondary | ICD-10-CM | POA: Diagnosis not present

## 2018-06-30 DIAGNOSIS — M47816 Spondylosis without myelopathy or radiculopathy, lumbar region: Secondary | ICD-10-CM | POA: Diagnosis not present

## 2018-07-02 DIAGNOSIS — M5440 Lumbago with sciatica, unspecified side: Secondary | ICD-10-CM | POA: Diagnosis not present

## 2018-07-19 DIAGNOSIS — M5416 Radiculopathy, lumbar region: Secondary | ICD-10-CM | POA: Diagnosis not present

## 2018-07-19 DIAGNOSIS — F411 Generalized anxiety disorder: Secondary | ICD-10-CM | POA: Insufficient documentation

## 2018-07-19 DIAGNOSIS — K2100 Gastro-esophageal reflux disease with esophagitis, without bleeding: Secondary | ICD-10-CM | POA: Insufficient documentation

## 2018-07-19 DIAGNOSIS — M5126 Other intervertebral disc displacement, lumbar region: Secondary | ICD-10-CM | POA: Diagnosis not present

## 2018-07-21 DIAGNOSIS — F411 Generalized anxiety disorder: Secondary | ICD-10-CM | POA: Diagnosis not present

## 2018-07-21 DIAGNOSIS — F321 Major depressive disorder, single episode, moderate: Secondary | ICD-10-CM | POA: Diagnosis not present

## 2018-07-23 ENCOUNTER — Other Ambulatory Visit (HOSPITAL_COMMUNITY): Payer: Self-pay | Admitting: Psychiatry

## 2018-07-23 DIAGNOSIS — F332 Major depressive disorder, recurrent severe without psychotic features: Secondary | ICD-10-CM

## 2018-07-23 DIAGNOSIS — F952 Tourette's disorder: Secondary | ICD-10-CM

## 2018-07-29 ENCOUNTER — Ambulatory Visit (HOSPITAL_COMMUNITY): Payer: PPO | Admitting: Psychiatry

## 2018-07-29 ENCOUNTER — Encounter (HOSPITAL_COMMUNITY): Payer: Self-pay | Admitting: Psychiatry

## 2018-07-29 DIAGNOSIS — F332 Major depressive disorder, recurrent severe without psychotic features: Secondary | ICD-10-CM | POA: Diagnosis not present

## 2018-07-29 DIAGNOSIS — F952 Tourette's disorder: Secondary | ICD-10-CM

## 2018-07-29 DIAGNOSIS — F411 Generalized anxiety disorder: Secondary | ICD-10-CM | POA: Diagnosis not present

## 2018-07-29 MED ORDER — BENZTROPINE MESYLATE 0.5 MG PO TABS: 0.5000 mg | ORAL_TABLET | Freq: Every day | ORAL | 0 refills | Status: DC

## 2018-07-29 MED ORDER — BUPROPION HCL ER (XL) 300 MG PO TB24: ORAL_TABLET | ORAL | 0 refills | Status: DC

## 2018-07-29 MED ORDER — HYDROXYZINE HCL 10 MG PO TABS: 10.0000 mg | ORAL_TABLET | Freq: Two times a day (BID) | ORAL | 0 refills | Status: DC | PRN

## 2018-07-29 MED ORDER — ARIPIPRAZOLE 30 MG PO TABS: 30.0000 mg | ORAL_TABLET | Freq: Every day | ORAL | 0 refills | Status: DC

## 2018-07-29 NOTE — Progress Notes (Signed)
[facility] MD/PA/NP OP Progress Note  07/29/2018 10:56 AM Penny Porter  MRN:  [id number]  Chief Complaint:  Chief Complaint    Follow-up     HPI: Pt reports she is doing well. She still touches the bridge of her nose every couple of days. It is part of her Tourette's. Her sister points it out. Pt feels the Tourette's is mostly unchanged. She doesn't want to try any news. Pt has some depression related to her physical pain and limitations. She hurts from even small activities so she mostly spends her time watching tv or sitting. Sleep is good. Pt denies SI/HI. Her anxiety flares up when she is trying to do things or out but overall well controlled.   Visit Diagnosis:    ICD-10-CM   1. Major depressive disorder, recurrent, severe without psychotic features ([place]) F33.2 ARIPiprazole (ABILIFY) 30 MG tablet    buPROPion (WELLBUTRIN XL) 300 MG 24 hr tablet  2. Tourette's disease F95.2 ARIPiprazole (ABILIFY) 30 MG tablet  3. Anxiety state F41.1 hydrOXYzine (ATARAX/VISTARIL) 10 MG tablet      Past Psychiatric History:  Anxiety:No Bipolar Disorder:No Depression:Yes Mania:No Psychosis:No Schizophrenia:No Personality Disorder:No Hospitalization for psychiatric illness:No History of Electroconvulsive Shock Therapy:No Prior Suicide Attempts:No    Past Medical History:  Past Medical History:  Diagnosis Date  . Anxiety   . Arthritis    Back and hip  . Asthma   . Bursitis   . Complication of anesthesia    bladder hard to wake up, never needed a in and out cath  . Costochondral chest pain   . Degenerative disk disease   . Depression   . Gastroparesis   . GERD (gastroesophageal reflux disease)   . Hypertension   . Lumbar herniated disc   . Sleep apnea    not using CPAP  . Stenosis of lumbosacral spine   . Stress fracture of foot   . Thyroid nodule   . Tourette disease   . Trigeminal nerve disease     Past Surgical History:  Procedure Laterality Date  . BACK SURGERY   September 19, 2015  . bone spur removal on back    . CHOLECYSTECTOMY  2005  . COLONOSCOPY    . ELBOW SURGERY Left May 29, 2014  . ESOPHAGOGASTRODUODENOSCOPY    . KNEE SURGERY  06/2016  . polyp removal    . POSTERIOR FUSION LUMBAR SPINE  06/26/2017  . ruptured disk  2008  . TEAR DUCT PROBING  2009    Family Psychiatric History: Family History  Problem Relation Age of Onset  . Mental retardation Sister   . Dementia Father   . Suicidality Neg Hx     Social History:  Social History   Socioeconomic History  . Marital status: Single    Spouse name: Not on file  . Number of children: Not on file  . Years of education: Not on file  . Highest education level: Not on file  Occupational History  . Not on file  Social Needs  . Financial resource strain: Not on file  . Food insecurity:    Worry: Not on file    Inability: Not on file  . Transportation needs:    Medical: Not on file    Non-medical: Not on file  Tobacco Use  . Smoking status: Never Smoker  . Smokeless tobacco: Never Used  Substance and Sexual Activity  . Alcohol use: No  . Drug use: No  . Sexual activity: Not Currently  Lifestyle  .  Physical activity:    Days per week: Not on file    Minutes per session: Not on file  . Stress: Not on file  Relationships  . Social connections:    Talks on phone: Not on file    Gets together: Not on file    Attends religious service: Not on file    Active member of club or organization: Not on file    Attends meetings of clubs or organizations: Not on file    Relationship status: Not on file  Other Topics Concern  . Not on file  Social History Narrative  . Not on file    Allergies:  Allergies  Allergen Reactions  . Amoxicillin Hives and Swelling    LIP SWELLING PATIENT HAS HAD A PCN REACTION WITH IMMEDIATE RASH, FACIAL/TONGUE/THROAT SWELLING, SOB, OR LIGHTHEADEDNESS WITH HYPOTENSION:  #  #  #  YES  #  #  #   HAS PT DEVELOPED SEVERE RASH INVOLVING MUCUS MEMBRANES or  SKIN NECROSIS: #  #  #  YES  #  #  #  Has patient had a PCN reaction that required hospitalization: No Has patient had a PCN reaction occurring within the last 10 years: Unknown If all of the above answers are "NO", then may proceed with Cephalosporin use.   . Avelox [Moxifloxacin Hcl In Nacl] Other (See Comments)    Caused pain in shoulders to finger, tendonitis  . Moxifloxacin Other (See Comments)    Caused pain in shoulders to finger, tendonitis  . Adhesive [Tape] Dermatitis    Blisters skin  . Bextra [Valdecoxib] Hives and Swelling    SWELLING REACTION UNSPECIFIED   . Doxycycline Other (See Comments)  . Doxycycline Hyclate Hives and Swelling    SWELLING REACTION UNSPECIFIED   . Gadobutrol Hives  . Gadolinium Derivatives Hives  . Gadoversetamide Hives  . Iodinated Diagnostic Agents Hives  . Lamictal [Lamotrigine] Hives  . Naproxen Hypertension  . Oxycodone-Acetaminophen Other (See Comments)  . Percolone [Oxycodone] Hives  . Relafen [Nabumetone] Hypertension  . Risperdal [Risperidone] Other (See Comments)    Leg weakness   . Fetzima [Levomilnacipran] Other (See Comments)    Tremors across head   . Nitrofurantoin Other (See Comments)    UNSPECIFIED REACTION   . Brintellix [Vortioxetine] Nausea And Vomiting    Metabolic Disorder Labs: No results found for: HGBA1C, MPG Lab Results  Component Value Date   PROLACTIN 13.1 06/09/2017   No results found for: CHOL, TRIG, HDL, CHOLHDL, VLDL, LDLCALC No results found for: TSH  Therapeutic Level Labs: No results found for: LITHIUM No results found for: VALPROATE No components found for:  CBMZ  Current Medications: Current Outpatient Medications  Medication Sig Dispense Refill  . Acetaminophen (TYLENOL PO) Take 1 tablet by mouth daily as needed (PAIN).    Marland Kitchen albuterol (PROVENTIL) (2.5 MG/3ML) 0.083% nebulizer solution PLACE 1 VIAL IN NEBULIZER EVERY 6 HOURS AS NEEDED FOR WHEEZING UP TO 30 DAYS  5  . allopurinol (ZYLOPRIM)  100 MG tablet Take by mouth.    . ARIPiprazole (ABILIFY) 30 MG tablet Take 1 tablet (30 mg total) by mouth daily. 90 tablet 0  . aspirin 81 MG tablet Take 81 mg by mouth daily.    . beclomethasone (QVAR REDIHALER) 80 MCG/ACT inhaler Inhale 2 puffs into the lungs 2 (two) times daily.    . budesonide-formoterol (SYMBICORT) 80-4.5 MCG/ACT inhaler Inhale two puffs using spacer twice daily to prevent cough or wheeze.  Rinse, gargle, and spit after use.  1 Inhaler 5  . buPROPion (WELLBUTRIN XL) 300 MG 24 hr tablet TAKE 1 TABLET (300 MG TOTAL) BY MOUTH EVERY MORNING. 90 tablet 0  . Cholecalciferol (VITAMIN D-3) 5000 UNITS TABS Take 5,000 Units by mouth daily.     . clotrimazole-betamethasone (LOTRISONE) cream APPLY SPARINGLY TO THE AFFECTED AREA(S) TWICE DAILY AS NEEDED FOR IRRITATION  5  . estradiol (ESTRACE) 0.1 MG/GM vaginal cream Place 1 Applicatorful vaginally at bedtime.    . hydrOXYzine (ATARAX/VISTARIL) 10 MG tablet Take 1 tablet (10 mg total) by mouth 2 (two) times daily as needed. 180 tablet 0  . ipratropium (ATROVENT) 0.03 % nasal spray USE 2 SPRAYS IN NOSTRIL (S) 2 TIMES DAILY AS DIRECTED  11  . ketoconazole (NIZORAL) 2 % shampoo Apply 1 application topically as needed for irritation.     Marland Kitchen loratadine (CLARITIN) 10 MG tablet Take 10 mg by mouth at bedtime.     Marland Kitchen lubiprostone (AMITIZA) 24 MCG capsule Take 24 mcg by mouth daily as needed for constipation.     . meloxicam (MOBIC) 7.5 MG tablet Take 7.5 mg by mouth 2 (two) times daily.  0  . metoprolol succinate (TOPROL-XL) 25 MG 24 hr tablet Take by mouth.    . montelukast (SINGULAIR) 10 MG tablet Take 10 mg by mouth daily.  5  . mupirocin ointment (BACTROBAN) 2 % Place 1 application into the nose 2 (two) times daily as needed (IRRITATION).     Marland Kitchen ondansetron (ZOFRAN-ODT) 4 MG disintegrating tablet Take 4 mg by mouth 2 (two) times daily as needed for nausea or vomiting.    . pregabalin (LYRICA) 75 MG capsule Take 75 mg by mouth 2 (two) times  daily. Reported on 10/09/2015    . PROAIR RESPICLICK 585 (90 Base) MCG/ACT AEPB INHALE 2 PUFFS BY MOUTH INTO THE LUNGS AS NEEDED (EVERY FOUR TO SIX HOURS FOR COUGH OR WHEEZE). 1 each 1  . RABEprazole (ACIPHEX) 20 MG tablet Take 20 mg by mouth daily.    . ranitidine (ZANTAC) 300 MG tablet Take 300 mg by mouth at bedtime.    Marland Kitchen spironolactone (ALDACTONE) 25 MG tablet Take 25 mg by mouth daily.    Marland Kitchen torsemide (DEMADEX) 20 MG tablet Take 20 mg by mouth daily.    . traMADol (ULTRAM) 50 MG tablet Take 50 mg by mouth.    . triamcinolone cream (KENALOG) 0.1 % Apply 1 application topically 2 (two) times daily as needed (IRRITATION).     Marland Kitchen triamcinolone ointment (KENALOG) 0.5 % Apply 1 application topically 2 (two) times daily as needed (IRRITATION).     Marland Kitchen vitamin B-12 (CYANOCOBALAMIN) 1000 MCG tablet Take 1,000 mcg by mouth daily.    . benztropine (COGENTIN) 0.5 MG tablet Take 1 tablet (0.5 mg total) by mouth daily. 90 tablet 0   No current facility-administered medications for this visit.      Musculoskeletal: Strength & Muscle Tone: within normal limits Gait & Station: unsteady, walking with cane Patient leans: none  Psychiatric Specialty Exam: Review of Systems  Constitutional: Negative for chills, diaphoresis and fever.  Musculoskeletal: Positive for back pain. Negative for falls and joint pain.    Blood pressure 128/80, height 5' 9.5" (1.765 m), weight 270 lb (122.5 kg).Body mass index is 39.3 kg/m.  General Appearance: Fairly Groomed  Eye Contact:  Good  Speech:  Clear and Coherent and Normal Rate  Volume:  Normal  Mood:  Anxious and Depressed  Affect:  Full Range  Thought Process:  Goal Directed and  Descriptions of Associations: Intact  Orientation:  Full (Time, Place, and Person)  Thought Content:  Logical  Suicidal Thoughts:  No  Homicidal Thoughts:  No  Memory:  Immediate;   Good  Judgement:  Good  Insight:  Good  Psychomotor Activity:  Normal  Concentration:  Concentration:  Good  Recall:  Good  Fund of Knowledge:  Good  Language:  Good  Akathisia:  No  Handed:  Right  AIMS (if indicated):     Assets:  Communication Skills Desire for [place] Talents/Skills Transportation  ADL's:  Intact  Cognition:  WNL  Sleep:   fair       Screenings: PHQ2-9     Patient Outreach Telephone from 06/02/2017 in [facility] Patient Outreach Telephone from 03/10/2017 in [facility]  PHQ-2 Total Score  0  0      I reviewed the information below on 07/29/2018 and updated it Assessment and Plan: MDD-recurrent, moderate; Tourette's disorder; anxiety disorder unspecified   Medication management with supportive therapy. Risks and benefits, side effects and alternative treatment options discussed with patient. Pt was given an opportunity to ask questions about medication, illness, and treatment. All current psychiatric medications have been reviewed and discussed with the patient and adjusted as clinically appropriate. The patient has been provided an accurate and updated list of the medications being now prescribed. Patient expressed understanding of how their medications were to be used.  Pt verbalized understanding and verbal consent obtained for treatment.  The risk of un-intended pregnancy is high based on the fact that pt reports she is not using any contraceptive and has irregular periods. Pt denies current sexual activity. Pt is aware that these meds carry a teratogenic risk. Pt will discuss plan of action if she does or plans to become pregnant in the future.  Status of current problems: mood and anxiety are stable  Meds: Abilify 30 mg p.o. daily for MDD and Tourette's Wellbutrin XL 300 mg p.o. daily for MDD.  Higher doses caused hypertension in the past Vistaril 10 mg p.o. 2 times daily as needed anxiety Cogentin 0.5mg  po qHS for potential eps development  Labs: none  Therapy: brief supportive therapy  provided. Discussed psychosocial stressors in detail.   Encouraged pt to develop daily routine and work on daily goal setting as a way to improve mood symptoms.  Discussed behavioral management of Tourettes  Consultations: Encouraged to follow up with therapist Encouraged to follow up with PCP as needed  Pt denies SI and is at an acute low risk for suicide. Patient told to call clinic if any problems occur. Patient advised to go to ER if they should develop SI/HI, side effects, or if symptoms worsen. Has crisis numbers to call if needed. Pt verbalized understanding.  F/up in 3 months or sooner if needed  Charlcie Cradle, MD 07/29/2018, 10:56 AM

## 2018-08-02 ENCOUNTER — Other Ambulatory Visit: Payer: Self-pay | Admitting: Allergy and Immunology

## 2018-08-06 DIAGNOSIS — M5416 Radiculopathy, lumbar region: Secondary | ICD-10-CM | POA: Diagnosis not present

## 2018-08-06 DIAGNOSIS — M48062 Spinal stenosis, lumbar region with neurogenic claudication: Secondary | ICD-10-CM | POA: Diagnosis not present

## 2018-08-19 DIAGNOSIS — F321 Major depressive disorder, single episode, moderate: Secondary | ICD-10-CM | POA: Diagnosis not present

## 2018-08-19 DIAGNOSIS — F411 Generalized anxiety disorder: Secondary | ICD-10-CM | POA: Diagnosis not present

## 2018-08-23 ENCOUNTER — Encounter: Payer: Self-pay | Admitting: Allergy and Immunology

## 2018-08-23 ENCOUNTER — Ambulatory Visit: Payer: PPO | Admitting: Allergy and Immunology

## 2018-08-23 VITALS — BP 136/80 | HR 80 | Resp 18

## 2018-08-23 DIAGNOSIS — J3089 Other allergic rhinitis: Secondary | ICD-10-CM

## 2018-08-23 DIAGNOSIS — F952 Tourette's disorder: Secondary | ICD-10-CM | POA: Diagnosis not present

## 2018-08-23 DIAGNOSIS — J4540 Moderate persistent asthma, uncomplicated: Secondary | ICD-10-CM

## 2018-08-23 DIAGNOSIS — E782 Mixed hyperlipidemia: Secondary | ICD-10-CM | POA: Diagnosis not present

## 2018-08-23 DIAGNOSIS — Z79899 Other long term (current) drug therapy: Secondary | ICD-10-CM | POA: Diagnosis not present

## 2018-08-23 DIAGNOSIS — J310 Chronic rhinitis: Secondary | ICD-10-CM | POA: Diagnosis not present

## 2018-08-23 DIAGNOSIS — I878 Other specified disorders of veins: Secondary | ICD-10-CM | POA: Diagnosis not present

## 2018-08-23 DIAGNOSIS — I10 Essential (primary) hypertension: Secondary | ICD-10-CM | POA: Diagnosis not present

## 2018-08-23 DIAGNOSIS — K219 Gastro-esophageal reflux disease without esophagitis: Secondary | ICD-10-CM | POA: Diagnosis not present

## 2018-08-23 DIAGNOSIS — G4733 Obstructive sleep apnea (adult) (pediatric): Secondary | ICD-10-CM | POA: Diagnosis not present

## 2018-08-23 DIAGNOSIS — M5030 Other cervical disc degeneration, unspecified cervical region: Secondary | ICD-10-CM | POA: Diagnosis not present

## 2018-08-23 DIAGNOSIS — R5381 Other malaise: Secondary | ICD-10-CM | POA: Diagnosis not present

## 2018-08-23 DIAGNOSIS — K3184 Gastroparesis: Secondary | ICD-10-CM | POA: Diagnosis not present

## 2018-08-23 DIAGNOSIS — G500 Trigeminal neuralgia: Secondary | ICD-10-CM | POA: Diagnosis not present

## 2018-08-23 DIAGNOSIS — M5136 Other intervertebral disc degeneration, lumbar region: Secondary | ICD-10-CM | POA: Diagnosis not present

## 2018-08-23 DIAGNOSIS — R5383 Other fatigue: Secondary | ICD-10-CM | POA: Diagnosis not present

## 2018-08-23 DIAGNOSIS — K5909 Other constipation: Secondary | ICD-10-CM | POA: Diagnosis not present

## 2018-08-23 DIAGNOSIS — E042 Nontoxic multinodular goiter: Secondary | ICD-10-CM | POA: Diagnosis not present

## 2018-08-23 NOTE — Progress Notes (Signed)
Follow-up Note  Referring Provider: Raina Mina., MD Primary Provider: Raina Mina., MD Date of Office Visit: 08/23/2018  Subjective:   Penny Porter (DOB: 04-20-1966) is a 53 y.o. female who returns to the [facility] on 08/23/2018 in re-evaluation of the following:  HPI: Penny Porter returns to this clinic in reevaluation of her asthma and allergic rhinitis and LPR.  Her last visit to this clinic was 18 February 2018.  Overall she has done well since her last visit but unfortunately over the course of the past 3 weeks she has noticed some intermittent shortness of breath.  She is not really sure if her rescue inhaler helps this issue.  Her requirement for rescue inhaler at this point is about 3 times per week.  Prior to this past 3 weeks she was not really using her rescue inhaler.  She has no other associated respiratory tract symptoms.  She does not have any cough or chest pain or sputum production and she has not been having any issues with her nose.    As well, she believes that her reflux is under very good control on her current plan.  As well, she has developed a little bit of raspiness for the past 3 days or so without any associated upper or lower airway symptoms or fever.  She did obtain a flu vaccine this year.  Allergies as of 08/23/2018      Reactions   Amoxicillin Hives, Swelling   LIP SWELLING PATIENT HAS HAD A PCN REACTION WITH IMMEDIATE RASH, FACIAL/TONGUE/THROAT SWELLING, SOB, OR LIGHTHEADEDNESS WITH HYPOTENSION:  #  #  #  YES  #  #  #   HAS PT DEVELOPED SEVERE RASH INVOLVING MUCUS MEMBRANES or SKIN NECROSIS: #  #  #  YES  #  #  #  Has patient had a PCN reaction that required hospitalization: No Has patient had a PCN reaction occurring within the last 10 years: Unknown If all of the above answers are "NO", then may proceed with Cephalosporin use.   Avelox [moxifloxacin Hcl In Nacl] Other (See Comments)   Caused pain in shoulders to finger, tendonitis   Moxifloxacin Other (See Comments)   Caused pain in shoulders to finger, tendonitis   Adhesive [tape] Dermatitis   Blisters skin   Bextra [valdecoxib] Hives, Swelling   SWELLING REACTION UNSPECIFIED    Doxycycline Other (See Comments)   Doxycycline Hyclate Hives, Swelling   SWELLING REACTION UNSPECIFIED    Gadobutrol Hives   Gadolinium Derivatives Hives   Gadoversetamide Hives   Iodinated Diagnostic Agents Hives   Lamictal [lamotrigine] Hives   Naproxen Hypertension   Oxycodone-acetaminophen Other (See Comments)   Percolone [oxycodone] Hives   Relafen [nabumetone] Hypertension   Risperdal [risperidone] Other (See Comments)   Leg weakness   Fetzima [levomilnacipran] Other (See Comments)   Tremors across head   Nitrofurantoin Other (See Comments)   UNSPECIFIED REACTION    Brintellix [vortioxetine] Nausea And Vomiting      Medication List      albuterol (2.5 MG/3ML) 0.083% nebulizer solution Commonly known as:  PROVENTIL PLACE 1 VIAL IN NEBULIZER EVERY 6 HOURS AS NEEDED FOR WHEEZING UP TO 30 DAYS   PROAIR RESPICLICK 568 (90 Base) MCG/ACT Aepb Generic drug:  Albuterol Sulfate INHALE 2 PUFFS BY MOUTH INTO THE LUNGS AS NEEDED (EVERY FOUR TO SIX HOURS FOR COUGH OR WHEEZE).   allopurinol 100 MG tablet Commonly known as:  ZYLOPRIM Take by mouth.   ARIPiprazole  30 MG tablet Commonly known as:  ABILIFY Take 1 tablet (30 mg total) by mouth daily.   aspirin 81 MG tablet Take 81 mg by mouth daily.   benztropine 0.5 MG tablet Commonly known as:  COGENTIN Take 1 tablet (0.5 mg total) by mouth daily.   budesonide-formoterol 80-4.5 MCG/ACT inhaler Commonly known as:  SYMBICORT INHALE TWO PUFFS USING SPACER 2 X DAILY TO PREVENT COUGH OR WHEEZE.RINSE, GARGLE, AND SPIT AFTER USE   buPROPion 300 MG 24 hr tablet Commonly known as:  WELLBUTRIN XL TAKE 1 TABLET (300 MG TOTAL) BY MOUTH EVERY MORNING.   clotrimazole-betamethasone cream Commonly known as:  LOTRISONE APPLY SPARINGLY  TO THE AFFECTED AREA(S) TWICE DAILY AS NEEDED FOR IRRITATION   estradiol 0.1 MG/GM vaginal cream Commonly known as:  ESTRACE Place 1 Applicatorful vaginally at bedtime.   hydrOXYzine 10 MG tablet Commonly known as:  ATARAX/VISTARIL Take 1 tablet (10 mg total) by mouth 2 (two) times daily as needed.   ipratropium 0.03 % nasal spray Commonly known as:  ATROVENT USE 2 SPRAYS IN NOSTRIL (S) 2 TIMES DAILY AS DIRECTED   ketoconazole 2 % shampoo Commonly known as:  NIZORAL Apply 1 application topically as needed for irritation.   loratadine 10 MG tablet Commonly known as:  CLARITIN Take 10 mg by mouth at bedtime.   lubiprostone 24 MCG capsule Commonly known as:  AMITIZA Take 24 mcg by mouth daily as needed for constipation.   meloxicam 7.5 MG tablet Commonly known as:  MOBIC Take 7.5 mg by mouth 2 (two) times daily.   metoprolol succinate 25 MG 24 hr tablet Commonly known as:  TOPROL-XL Take by mouth.   montelukast 10 MG tablet Commonly known as:  SINGULAIR Take 10 mg by mouth daily.   mupirocin ointment 2 % Commonly known as:  BACTROBAN Place 1 application into the nose 2 (two) times daily as needed (IRRITATION).   ondansetron 4 MG disintegrating tablet Commonly known as:  ZOFRAN-ODT Take 4 mg by mouth 2 (two) times daily as needed for nausea or vomiting.   pregabalin 75 MG capsule Commonly known as:  LYRICA Take 75 mg by mouth 2 (two) times daily. Reported on 10/09/2015   RABEprazole 20 MG tablet Commonly known as:  ACIPHEX Take 20 mg by mouth daily.   ranitidine 300 MG tablet Commonly known as:  ZANTAC Take 300 mg by mouth at bedtime.   spironolactone 25 MG tablet Commonly known as:  ALDACTONE Take 25 mg by mouth daily.   torsemide 20 MG tablet Commonly known as:  DEMADEX Take 20 mg by mouth daily.   traMADol 50 MG tablet Commonly known as:  ULTRAM Take 50 mg by mouth.   triamcinolone cream 0.1 % Commonly known as:  KENALOG Apply 1 application  topically 2 (two) times daily as needed (IRRITATION).   triamcinolone ointment 0.5 % Commonly known as:  KENALOG Apply 1 application topically 2 (two) times daily as needed (IRRITATION).   TYLENOL PO Take 1 tablet by mouth daily as needed (PAIN).   vitamin B-12 1000 MCG tablet Commonly known as:  CYANOCOBALAMIN Take 1,000 mcg by mouth daily.   Vitamin D-3 125 MCG (5000 UT) Tabs Take 5,000 Units by mouth daily.       Past Medical History:  Diagnosis Date  . Anxiety   . Arthritis    Back and hip  . Asthma   . Bursitis   . Complication of anesthesia    bladder hard to wake up, never needed  a in and out cath  . Costochondral chest pain   . Degenerative disk disease   . Depression   . Gastroparesis   . GERD (gastroesophageal reflux disease)   . Hypertension   . Lumbar herniated disc   . Sleep apnea    not using CPAP  . Stenosis of lumbosacral spine   . Stress fracture of foot   . Thyroid nodule   . Tourette disease   . Trigeminal nerve disease     Past Surgical History:  Procedure Laterality Date  . BACK SURGERY  September 19, 2015  . bone spur removal on back    . CHOLECYSTECTOMY  2005  . COLONOSCOPY    . ELBOW SURGERY Left May 29, 2014  . ESOPHAGOGASTRODUODENOSCOPY    . KNEE SURGERY  06/2016  . polyp removal    . POSTERIOR FUSION LUMBAR SPINE  06/26/2017  . ruptured disk  2008  . TEAR DUCT PROBING  2009    Review of systems negative except as noted in HPI / PMHx or noted below:  Review of Systems  Constitutional: Negative.   HENT: Negative.   Eyes: Negative.   Respiratory: Negative.   Cardiovascular: Negative.   Gastrointestinal: Negative.   Genitourinary: Negative.   Musculoskeletal: Negative.   Skin: Negative.   Neurological: Negative.   Endo/Heme/Allergies: Negative.   Psychiatric/Behavioral: Negative.      Objective:   Vitals:   08/23/18 1541  BP: 136/80  Pulse: 80  Resp: 18  SpO2: 98%          Physical Exam Constitutional:       Appearance: She is not diaphoretic.  HENT:     Head: Normocephalic.     Right Ear: Tympanic membrane, ear canal and external ear normal.     Left Ear: Tympanic membrane, ear canal and external ear normal.     Nose: Nose normal. No mucosal edema or rhinorrhea.     Mouth/Throat:     Pharynx: Uvula midline. No oropharyngeal exudate.  Eyes:     Conjunctiva/sclera: Conjunctivae normal.  Neck:     Thyroid: No thyromegaly.     Trachea: Trachea normal. No tracheal tenderness or tracheal deviation.  Cardiovascular:     Rate and Rhythm: Normal rate and regular rhythm.     Heart sounds: Normal heart sounds, S1 normal and S2 normal. No murmur.  Pulmonary:     Effort: No respiratory distress.     Breath sounds: Normal breath sounds. No stridor. No wheezing or rales.  Lymphadenopathy:     Head:     Right side of head: No tonsillar adenopathy.     Left side of head: No tonsillar adenopathy.     Cervical: No cervical adenopathy.  Skin:    Findings: No erythema or rash.     Nails: There is no clubbing.   Neurological:     Mental Status: She is alert.     Diagnostics:    Spirometry was performed and demonstrated an FEV1 of 1.85 at 55 % of predicted.  The patient had an Asthma Control Test with the following results: ACT Total Score: 17.    Assessment and Plan:   1. Not well controlled moderate persistent asthma   2. Other allergic rhinitis   3. LPRD (laryngopharyngeal reflux disease)     1. Continue Symbicort 80 - 2 inhalations 2 times a day with spacer  2. Continue OTC Nasacort one spray each 1 time per day  3. Continue montelukast 10mg  one tablet  one time per day  4. Continue AcipHex 20 mg in the morning and ranitidine 300 mg in the evening  5. Continue ProAir HFA or similar and antihistamine and nasal ipratropium if needed  6.  Prednisone 10 mg -1 tablet once a day for 10 days only  7. Return to clinic in 6 months or earlier if problem   I have given Penny Porter a very low dose  of systemic steroid today to see if this helps with some of her symptoms assuming that this is secondary to inflammation of her airway.  If she does well then I will just see her back in this clinic in 6 months while she continues on a collection of anti-inflammatory agents for her airway and therapy directed against reflux.  She will contact me during the interval should there be a problem.  Allena Katz, MD Allergy / Immunology [facility]

## 2018-08-23 NOTE — Patient Instructions (Addendum)
  1. Continue Symbicort 80 - 2 inhalations 2 times a day with spacer  2. Continue OTC Nasacort one spray each 1 time per day  3. Continue montelukast 10mg  one tablet one time per day  4. Continue AcipHex 20 mg in the morning and ranitidine 300 mg in the evening  5. Continue ProAir HFA or similar and antihistamine and nasal ipratropium if needed  6.  Prednisone 10 mg -1 tablet once a day for 10 days only  7. Return to clinic in 6 months or earlier if problem

## 2018-08-24 ENCOUNTER — Encounter: Payer: Self-pay | Admitting: Allergy and Immunology

## 2018-08-24 DIAGNOSIS — N183 Chronic kidney disease, stage 3 unspecified: Secondary | ICD-10-CM | POA: Insufficient documentation

## 2018-08-25 ENCOUNTER — Telehealth: Payer: Self-pay | Admitting: Allergy and Immunology

## 2018-08-25 NOTE — Telephone Encounter (Signed)
[place], Taves would like you to call her.

## 2018-08-25 NOTE — Telephone Encounter (Signed)
Called patient.  She was wondering if prednisone would help with her hoarseness.  I told her it could help if the hoarseness was being caused by inflammation.  I asked her to give the prednisone a little longer to work; she started prednisone on Monday.  I asked her to let us know if she does not get to feeling better.

## 2018-08-27 DIAGNOSIS — E042 Nontoxic multinodular goiter: Secondary | ICD-10-CM | POA: Diagnosis not present

## 2018-08-30 ENCOUNTER — Other Ambulatory Visit: Payer: Self-pay | Admitting: *Deleted

## 2018-08-30 NOTE — Patient Outreach (Signed)
[facility]) Care Management  08/30/2018  Penny Porter 1965/08/23 [id number]   Telephone Screen  Referral Date: 0120/2020 Referral Source: [facility] nurse call center  Referral Reason: hoarseness for the past week, Allergy, Asthma inhaler Symbicort and rescue inhaler, no sore throat or cold s/s, MD agave prednisone on 08/23/2018 Insurance: HTA- RN provided home care 08/27/2018   Outreach attempt # 1  Patient is able to verify HIPAA Reviewed and addressed referral to [facility] with patient  [name] reports her hoarseness is getting better. She has 2 more days of her prednisone. Epic indicates she started her prednisone on 1/13/202 She has been in contact with her allergist (seen last on 08/23/2018) vs her primary care MD. She feels that her hoarseness will get better She was encouraged by [facility] RN CM to call if she did not feel any better by 09/02/2018 and she agreed to do so  She was encouraged to rest her voice, continue warm to hot liquids, to write more than talk and other interventions discussed by the nurse from the call center  Social: [name] is single, lives alone and has supportive family (brother and sister)   Conditions: Tourette disorder, lumbar herniated nucleus pulposus, severe recurrent major depressive disorder without psychotic features, anxiety, constipation, HTN,    Consent: [facility] RN CM reviewed [facility] services with patient. Patient gave verbal consent for services.  Plan: [facility] RN CM will close case at this time as patient has been assessed and no needs identified/needs resolved.   Pt encouraged to return a call to [place] CM prn  [facility] RN CM sent a successful outreach letter as discussed with [facility] brochure enclosed for review  Kimberly L. Lavina Hamman, RN, BSN, [place] Coordinator Office number [phone] Mobile number [phone]  Main [facility] number [phone] Fax number [phone]

## 2018-08-31 DIAGNOSIS — M25512 Pain in left shoulder: Secondary | ICD-10-CM | POA: Diagnosis not present

## 2018-09-01 ENCOUNTER — Other Ambulatory Visit: Payer: Self-pay | Admitting: Allergy and Immunology

## 2018-09-01 ENCOUNTER — Telehealth: Payer: Self-pay

## 2018-09-01 NOTE — Telephone Encounter (Signed)
Let us get a chest x-ray to make sure that nothing is going on in her mediastinum giving rise to hoarseness.  We will not apply any other therapy until we see the results of the chest x-ray.

## 2018-09-01 NOTE — Telephone Encounter (Signed)
Patient came by and she is still struggling with hoarseness.  It has gotten worse and she has been sneezing more too.  Throat does not hurt, just with hoarseness.  Today was her last day of prednisone. Please advise.

## 2018-09-01 NOTE — Telephone Encounter (Signed)
Patient informed and X-Ray form for [facility] left at front desk for her to pick up.

## 2018-09-02 ENCOUNTER — Telehealth: Payer: Self-pay

## 2018-09-02 ENCOUNTER — Other Ambulatory Visit: Payer: Self-pay

## 2018-09-02 DIAGNOSIS — R05 Cough: Secondary | ICD-10-CM | POA: Diagnosis not present

## 2018-09-02 DIAGNOSIS — R490 Dysphonia: Secondary | ICD-10-CM | POA: Diagnosis not present

## 2018-09-02 DIAGNOSIS — J45909 Unspecified asthma, uncomplicated: Secondary | ICD-10-CM | POA: Diagnosis not present

## 2018-09-02 MED ORDER — NYSTATIN 100000 UNIT/ML MT SUSP: OROMUCOSAL | 0 refills | Status: DC

## 2018-09-02 NOTE — Telephone Encounter (Signed)
Patient's sister called and was wanting x ray results. Informed her and the patient that Kozlow believes that this could be a possible fungal overgrowth. Informed that we will be sending in Nystatin and she will need to swish and swallow 85mL's 3 times a day for 7 days. Patient and sister voiced understanding.

## 2018-09-03 ENCOUNTER — Other Ambulatory Visit: Payer: Self-pay | Admitting: *Deleted

## 2018-09-03 ENCOUNTER — Encounter: Payer: Self-pay | Admitting: *Deleted

## 2018-09-03 NOTE — Patient Outreach (Signed)
[facility]) Care Management  09/03/2018  Adrien Dietzman 1965-09-28 [id number]   Care coordination   Ms Franqui returned a call to [place] CM after she reports not being able to have time to follow up with CM on 09/02/2018. CM had encouraged Ms Calk to call CM if her hoarseness did not get any better  She had returned back to see Dr Alden Benjamin, Allergist  who has placed her on nystatin tid for 7 days for a fungal overgrowth that may be related to her Symbicort She reports using a spacer and immediately rinses her mouth out after each use. She feels it may be better if she holds off on her Symbicort with spacer use for few days and then return to using it. She reports not getting a new inhaler or spacer or MD discussing a possible medication change. CM encouraged cleaning the inhaler, opening and spacer with Chlortox followed by water and drying or to get new devices  She will follow up with CM if this continues so Cm can consult [place] for assist or recommendation   Valetta Mulroy L. Lavina Hamman, RN, BSN, [place] Coordinator Office number [phone] Mobile number [phone]  Main [facility] number [phone] Fax number [phone]

## 2018-09-06 ENCOUNTER — Telehealth: Payer: Self-pay | Admitting: Allergy and Immunology

## 2018-09-06 NOTE — Telephone Encounter (Signed)
Patient informed.  Patient did ask what she can do for phlegm.  I told her she could try the OTC Mucinex DM to help with chest congestion. Patient ok with plan.

## 2018-09-06 NOTE — Telephone Encounter (Signed)
Please inform patient that if she can do well on Symbicort just 1 time per day (2 puffs) then she is welcome to decrease her dose of Symbicort.

## 2018-09-06 NOTE — Telephone Encounter (Signed)
Patient is wondering if fungal overgrowth could be from Symbicort usage.  She has not used it in the past few days.  Should she continue Symbicort?  She is using the Nystatin and her voice is a little better. Please advise.

## 2018-09-06 NOTE — Telephone Encounter (Signed)
Penny Porter pl ease call patient Would not specify

## 2018-09-08 DIAGNOSIS — M5416 Radiculopathy, lumbar region: Secondary | ICD-10-CM | POA: Diagnosis not present

## 2018-09-08 DIAGNOSIS — Z6838 Body mass index (BMI) 38.0-38.9, adult: Secondary | ICD-10-CM | POA: Diagnosis not present

## 2018-09-08 DIAGNOSIS — I10 Essential (primary) hypertension: Secondary | ICD-10-CM | POA: Diagnosis not present

## 2018-09-08 DIAGNOSIS — M48062 Spinal stenosis, lumbar region with neurogenic claudication: Secondary | ICD-10-CM | POA: Diagnosis not present

## 2018-09-09 ENCOUNTER — Telehealth: Payer: Self-pay | Admitting: Allergy and Immunology

## 2018-09-09 NOTE — Telephone Encounter (Signed)
Patient is calling stating she has finished medications and has some questions for the clinical staff

## 2018-09-09 NOTE — Telephone Encounter (Signed)
Please call.

## 2018-09-09 NOTE — Telephone Encounter (Signed)
Called patient and she states that she has finished her Nystatin and noticed last night, her hoarseness is starting to come back. She also mentioned that she had a cortisone shot yesterday and was told it could cause hoarseness. She reached out to the doctor's office that did the cortisone shot and asked if the shot could cause hoarseness and was told they would be calling her back after they spoke to the provider. She states she hasn't been using Symbicort so she knows that it isn't the source of her hoarseness. She wants to know if Dr. Neldon Mc thinks the cortisone shots could be causing her hoarseness. Please advise.

## 2018-09-10 ENCOUNTER — Other Ambulatory Visit (HOSPITAL_COMMUNITY): Payer: Self-pay | Admitting: Psychiatry

## 2018-09-10 ENCOUNTER — Other Ambulatory Visit: Payer: Self-pay

## 2018-09-10 DIAGNOSIS — G500 Trigeminal neuralgia: Secondary | ICD-10-CM | POA: Diagnosis not present

## 2018-09-10 DIAGNOSIS — F332 Major depressive disorder, recurrent severe without psychotic features: Secondary | ICD-10-CM

## 2018-09-10 DIAGNOSIS — R6884 Jaw pain: Secondary | ICD-10-CM | POA: Diagnosis not present

## 2018-09-10 DIAGNOSIS — F411 Generalized anxiety disorder: Secondary | ICD-10-CM

## 2018-09-10 DIAGNOSIS — F952 Tourette's disorder: Secondary | ICD-10-CM

## 2018-09-10 MED ORDER — NYSTATIN 100000 UNIT/ML MT SUSP: OROMUCOSAL | 0 refills | Status: DC

## 2018-09-10 NOTE — Telephone Encounter (Signed)
Lets stay on Nystatin oral solution 24mls one time per day for the next month and see how she does with this treatment.

## 2018-09-10 NOTE — Telephone Encounter (Signed)
Patient informed.  Will send in new RX to [facility] on [address]

## 2018-09-23 DIAGNOSIS — E042 Nontoxic multinodular goiter: Secondary | ICD-10-CM | POA: Diagnosis not present

## 2018-09-28 ENCOUNTER — Telehealth (HOSPITAL_COMMUNITY): Payer: Self-pay

## 2018-09-28 DIAGNOSIS — G629 Polyneuropathy, unspecified: Secondary | ICD-10-CM | POA: Diagnosis not present

## 2018-09-28 DIAGNOSIS — I739 Peripheral vascular disease, unspecified: Secondary | ICD-10-CM | POA: Insufficient documentation

## 2018-09-28 NOTE — Telephone Encounter (Signed)
Patient is calling because she is feeling off, she said she feels "swimmy headed" patient wants to know if it could be coming from the cogentin. Please review and advise, thank you

## 2018-09-29 DIAGNOSIS — F321 Major depressive disorder, single episode, moderate: Secondary | ICD-10-CM | POA: Diagnosis not present

## 2018-09-29 DIAGNOSIS — F411 Generalized anxiety disorder: Secondary | ICD-10-CM | POA: Diagnosis not present

## 2018-09-30 NOTE — Telephone Encounter (Signed)
New onset?  She has been on these meds for a long while. I would consider other possibilities- new meds? Getting sick?

## 2018-10-02 NOTE — Telephone Encounter (Signed)
I called the patient back, she said she has not started taking any new medications and she is not feeling sick. I advised patient to follow up with PCP as I am concerned about her dizziness. Patient will do that and get in touch with me next week.

## 2018-10-04 ENCOUNTER — Other Ambulatory Visit: Payer: Self-pay | Admitting: Allergy and Immunology

## 2018-10-08 DIAGNOSIS — R42 Dizziness and giddiness: Secondary | ICD-10-CM | POA: Diagnosis not present

## 2018-10-08 DIAGNOSIS — H8113 Benign paroxysmal vertigo, bilateral: Secondary | ICD-10-CM | POA: Diagnosis not present

## 2018-10-08 DIAGNOSIS — H532 Diplopia: Secondary | ICD-10-CM | POA: Diagnosis not present

## 2018-10-11 ENCOUNTER — Other Ambulatory Visit (HOSPITAL_COMMUNITY): Payer: Self-pay | Admitting: Neurosurgery

## 2018-10-11 ENCOUNTER — Other Ambulatory Visit: Payer: Self-pay

## 2018-10-11 ENCOUNTER — Ambulatory Visit (HOSPITAL_COMMUNITY)
Admission: RE | Admit: 2018-10-11 | Discharge: 2018-10-11 | Disposition: A | Discharge status: Home / Self Care | Payer: PPO | Source: Ambulatory Visit | Attending: Surgery | Admitting: Surgery

## 2018-10-11 DIAGNOSIS — I739 Peripheral vascular disease, unspecified: Secondary | ICD-10-CM | POA: Insufficient documentation

## 2018-10-11 MED ORDER — AZITHROMYCIN 250 MG PO TABS: ORAL_TABLET | ORAL | 3 refills | Status: DC

## 2018-10-12 DIAGNOSIS — M5030 Other cervical disc degeneration, unspecified cervical region: Secondary | ICD-10-CM | POA: Diagnosis not present

## 2018-10-12 DIAGNOSIS — N649 Disorder of breast, unspecified: Secondary | ICD-10-CM | POA: Diagnosis not present

## 2018-10-12 DIAGNOSIS — G500 Trigeminal neuralgia: Secondary | ICD-10-CM | POA: Diagnosis not present

## 2018-10-12 DIAGNOSIS — H8113 Benign paroxysmal vertigo, bilateral: Secondary | ICD-10-CM | POA: Diagnosis not present

## 2018-10-14 DIAGNOSIS — M5416 Radiculopathy, lumbar region: Secondary | ICD-10-CM | POA: Diagnosis not present

## 2018-10-14 DIAGNOSIS — M48062 Spinal stenosis, lumbar region with neurogenic claudication: Secondary | ICD-10-CM | POA: Diagnosis not present

## 2018-10-14 DIAGNOSIS — M542 Cervicalgia: Secondary | ICD-10-CM | POA: Diagnosis not present

## 2018-10-18 ENCOUNTER — Other Ambulatory Visit (HOSPITAL_COMMUNITY): Payer: Self-pay | Admitting: Psychiatry

## 2018-10-19 ENCOUNTER — Telehealth (HOSPITAL_COMMUNITY): Payer: Self-pay

## 2018-10-19 NOTE — Telephone Encounter (Signed)
Patient called back last week on Friday. She still believed that the medication, Benztropine, was making her sick. I advised patient to stop the medication for the weekend and see if the symptoms cleared up. Patient called me back this morning and indicated that her symptoms had indeed cleared up and she was going to stay off of the Benztropine for now. Patient has a follow up next week.

## 2018-10-20 DIAGNOSIS — H524 Presbyopia: Secondary | ICD-10-CM | POA: Diagnosis not present

## 2018-10-27 DIAGNOSIS — F411 Generalized anxiety disorder: Secondary | ICD-10-CM | POA: Diagnosis not present

## 2018-10-27 DIAGNOSIS — F321 Major depressive disorder, single episode, moderate: Secondary | ICD-10-CM | POA: Diagnosis not present

## 2018-10-28 ENCOUNTER — Other Ambulatory Visit: Payer: Self-pay

## 2018-10-28 ENCOUNTER — Encounter (HOSPITAL_COMMUNITY): Payer: Self-pay | Admitting: Psychiatry

## 2018-10-28 ENCOUNTER — Ambulatory Visit (INDEPENDENT_AMBULATORY_CARE_PROVIDER_SITE_OTHER): Payer: PPO | Admitting: Psychiatry

## 2018-10-28 DIAGNOSIS — F411 Generalized anxiety disorder: Secondary | ICD-10-CM

## 2018-10-28 DIAGNOSIS — H532 Diplopia: Secondary | ICD-10-CM | POA: Diagnosis not present

## 2018-10-28 DIAGNOSIS — F332 Major depressive disorder, recurrent severe without psychotic features: Secondary | ICD-10-CM | POA: Diagnosis not present

## 2018-10-28 DIAGNOSIS — F952 Tourette's disorder: Secondary | ICD-10-CM

## 2018-10-28 DIAGNOSIS — H538 Other visual disturbances: Secondary | ICD-10-CM | POA: Diagnosis not present

## 2018-10-28 MED ORDER — ARIPIPRAZOLE 30 MG PO TABS: 30.0000 mg | ORAL_TABLET | Freq: Every day | ORAL | 0 refills | Status: DC

## 2018-10-28 MED ORDER — BUPROPION HCL ER (XL) 300 MG PO TB24: ORAL_TABLET | ORAL | 0 refills | Status: DC

## 2018-10-28 MED ORDER — HYDROXYZINE HCL 10 MG PO TABS: 10.0000 mg | ORAL_TABLET | Freq: Three times a day (TID) | ORAL | 0 refills | Status: DC | PRN

## 2018-10-28 NOTE — Progress Notes (Signed)
[place] MD/PA/NP OP Progress Note  10/28/2018 9:59 AM Penny Porter  MRN:  [id number]  Chief Complaint:  Chief Complaint    Anxiety; Follow-up     HPI: Penny Porter tells me that her anxiety has increased due to concerns about potential exposure and infection with coronavirus.  She finds that at times she is waking up a few hours early and is unable to fall back asleep.  This resulted in her taking a nap later in the day.  She is working on trying to get her sleep schedule back on track.  Due to the stress she notes that some of her Tourette's symptoms are increased including arm jerks and abdominal jerks.  She continues to use what ever coping skills that she has learned and states it helps some.  Her ongoing depression mostly related to her physical pain and health issues continues.  She is trying the best that she can.  She is denying anhedonia or crying spells.  She denies SI/HI.  She stopped the Cogentin due to urinary hesitancy and increased constipation.  Since stopping the medications the side effects have resolved.  Visit Diagnosis:    ICD-10-CM   1. Major depressive disorder, recurrent, severe without psychotic features (Penny Porter) F33.2 ARIPiprazole (ABILIFY) 30 MG tablet    buPROPion (WELLBUTRIN XL) 300 MG 24 hr tablet  2. Tourette's disease F95.2 ARIPiprazole (ABILIFY) 30 MG tablet  3. Anxiety state F41.1 hydrOXYzine (ATARAX/VISTARIL) 10 MG tablet      Past Psychiatric History:  Anxiety:No Bipolar Disorder:No Depression:Yes Mania:No Psychosis:No Schizophrenia:No Personality Disorder:No Hospitalization for psychiatric illness:No History of Electroconvulsive Shock Therapy:No Prior Suicide Attempts:No    Past Medical History:  Past Medical History:  Diagnosis Date  . Anxiety   . Arthritis    Back and hip  . Asthma   . Bursitis   . Complication of anesthesia    bladder hard to wake up, never needed a in and out cath  . Costochondral chest pain   . Degenerative disk disease    . Depression   . Gastroparesis   . GERD (gastroesophageal reflux disease)   . Hypertension   . Lumbar herniated disc   . Sleep apnea    not using CPAP  . Stenosis of lumbosacral spine   . Stress fracture of foot   . Thyroid nodule   . Tourette disease   . Trigeminal nerve disease     Past Surgical History:  Procedure Laterality Date  . BACK SURGERY  September 19, 2015  . bone spur removal on back    . CHOLECYSTECTOMY  2005  . COLONOSCOPY    . ELBOW SURGERY Left May 29, 2014  . ESOPHAGOGASTRODUODENOSCOPY    . KNEE SURGERY  06/2016  . polyp removal    . POSTERIOR FUSION LUMBAR SPINE  06/26/2017  . ruptured disk  2008  . TEAR DUCT PROBING  2009    Family Psychiatric History: Family History  Problem Relation Age of Onset  . Mental retardation Sister   . Dementia Father   . Suicidality Neg Hx     Social History:  Social History   Socioeconomic History  . Marital status: Single    Spouse name: Not on file  . Number of children: Not on file  . Years of education: Not on file  . Highest education level: Not on file  Occupational History  . Not on file  Social Needs  . Financial resource strain: Not on file  . Food insecurity:    Worry:  Not on file    Inability: Not on file  . Transportation needs:    Medical: Not on file    Non-medical: Not on file  Tobacco Use  . Smoking status: Never Smoker  . Smokeless tobacco: Never Used  Substance and Sexual Activity  . Alcohol use: No  . Drug use: No  . Sexual activity: Not Currently  Lifestyle  . Physical activity:    Days per week: Not on file    Minutes per session: Not on file  . Stress: Not on file  Relationships  . Social connections:    Talks on phone: Not on file    Gets together: Not on file    Attends religious service: Not on file    Active member of club or organization: Not on file    Attends meetings of clubs or organizations: Not on file    Relationship status: Not on file  Other Topics Concern   . Not on file  Social History Narrative  . Not on file    Allergies:  Allergies  Allergen Reactions  . Amoxicillin Hives and Swelling    LIP SWELLING PATIENT HAS HAD A PCN REACTION WITH IMMEDIATE RASH, FACIAL/TONGUE/THROAT SWELLING, SOB, OR LIGHTHEADEDNESS WITH HYPOTENSION:  #  #  #  YES  #  #  #   HAS PT DEVELOPED SEVERE RASH INVOLVING MUCUS MEMBRANES or SKIN NECROSIS: #  #  #  YES  #  #  #  Has patient had a PCN reaction that required hospitalization: No Has patient had a PCN reaction occurring within the last 10 years: Unknown If all of the above answers are "NO", then may proceed with Cephalosporin use.   . Avelox [Moxifloxacin Hcl In Nacl] Other (See Comments)    Caused pain in shoulders to finger, tendonitis  . Moxifloxacin Other (See Comments)    Caused pain in shoulders to finger, tendonitis  . Adhesive [Tape] Dermatitis    Blisters skin  . Bextra [Valdecoxib] Hives and Swelling    SWELLING REACTION UNSPECIFIED   . Doxycycline Other (See Comments)  . Doxycycline Hyclate Hives and Swelling    SWELLING REACTION UNSPECIFIED   . Gadobutrol Hives  . Gadolinium Derivatives Hives  . Gadoversetamide Hives  . Iodinated Diagnostic Agents Hives  . Lamictal [Lamotrigine] Hives  . Naproxen Hypertension  . Oxycodone-Acetaminophen Other (See Comments)  . Percolone [Oxycodone] Hives  . Relafen [Nabumetone] Hypertension  . Risperdal [Risperidone] Other (See Comments)    Leg weakness   . Fetzima [Levomilnacipran] Other (See Comments)    Tremors across head   . Nitrofurantoin Other (See Comments)    UNSPECIFIED REACTION   . Brintellix [Vortioxetine] Nausea And Vomiting    Metabolic Disorder Labs: No results found for: HGBA1C, MPG Lab Results  Component Value Date   PROLACTIN 13.1 06/09/2017   No results found for: CHOL, TRIG, HDL, CHOLHDL, VLDL, LDLCALC No results found for: TSH  Therapeutic Level Labs: No results found for: LITHIUM No results found for: VALPROATE No  components found for:  CBMZ  Current Medications: Current Outpatient Medications  Medication Sig Dispense Refill  . Acetaminophen (TYLENOL PO) Take 1 tablet by mouth daily as needed (PAIN).    Marland Kitchen albuterol (PROVENTIL) (2.5 MG/3ML) 0.083% nebulizer solution PLACE 1 VIAL IN NEBULIZER EVERY 6 HOURS AS NEEDED FOR WHEEZING UP TO 30 DAYS  5  . allopurinol (ZYLOPRIM) 100 MG tablet Take by mouth.    . ARIPiprazole (ABILIFY) 30 MG tablet Take 1 tablet (30  mg total) by mouth daily. 90 tablet 0  . aspirin 81 MG tablet Take 81 mg by mouth daily.    Marland Kitchen azithromycin (ZITHROMAX) 250 MG tablet Take one tablet by mouth 3 times weekly 12 tablet 3  . budesonide-formoterol (SYMBICORT) 80-4.5 MCG/ACT inhaler INHALE TWO PUFFS USING SPACER 2 X DAILY TO PREVENT COUGH OR WHEEZE.RINSE, GARGLE, AND SPIT AFTER USE 10.2 Inhaler 5  . buPROPion (WELLBUTRIN XL) 300 MG 24 hr tablet TAKE 1 TABLET (300 MG TOTAL) BY MOUTH EVERY MORNING. 90 tablet 0  . Cholecalciferol (VITAMIN D-3) 5000 UNITS TABS Take 5,000 Units by mouth daily.     . clotrimazole-betamethasone (LOTRISONE) cream APPLY SPARINGLY TO THE AFFECTED AREA(S) TWICE DAILY AS NEEDED FOR IRRITATION  5  . estradiol (ESTRACE) 0.1 MG/GM vaginal cream Place 1 Applicatorful vaginally at bedtime.    . hydrOXYzine (ATARAX/VISTARIL) 10 MG tablet Take 1 tablet (10 mg total) by mouth 3 (three) times daily as needed for anxiety. 270 tablet 0  . ipratropium (ATROVENT) 0.03 % nasal spray USE 2 SPRAYS IN NOSTRIL (S) 2 TIMES DAILY AS DIRECTED  11  . ketoconazole (NIZORAL) 2 % shampoo Apply 1 application topically as needed for irritation.     Marland Kitchen loratadine (CLARITIN) 10 MG tablet Take 10 mg by mouth at bedtime.     Marland Kitchen lubiprostone (AMITIZA) 24 MCG capsule Take 24 mcg by mouth daily as needed for constipation.     . meloxicam (MOBIC) 7.5 MG tablet Take 7.5 mg by mouth 2 (two) times daily.  0  . metoprolol succinate (TOPROL-XL) 25 MG 24 hr tablet Take by mouth.    . montelukast (SINGULAIR) 10  MG tablet Take 10 mg by mouth daily.  5  . mupirocin ointment (BACTROBAN) 2 % Place 1 application into the nose 2 (two) times daily as needed (IRRITATION).     Marland Kitchen nystatin (MYCOSTATIN) 100000 UNIT/ML suspension Swish and swallow 39mL's by mouth once daily for one month as directed. 150 mL 0  . ondansetron (ZOFRAN-ODT) 4 MG disintegrating tablet Take 4 mg by mouth 2 (two) times daily as needed for nausea or vomiting.    . pregabalin (LYRICA) 75 MG capsule Take 75 mg by mouth 2 (two) times daily. Reported on 10/09/2015    . PROAIR RESPICLICK 329 (90 Base) MCG/ACT AEPB INHALE 2 PUFFS BY MOUTH INTO THE LUNGS AS NEEDED (EVERY FOUR TO SIX HOURS FOR COUGH OR WHEEZE). 1 each 1  . RABEprazole (ACIPHEX) 20 MG tablet Take 20 mg by mouth daily.    . ranitidine (ZANTAC) 300 MG tablet Take 300 mg by mouth at bedtime.    Marland Kitchen spironolactone (ALDACTONE) 25 MG tablet Take 25 mg by mouth daily.    Marland Kitchen torsemide (DEMADEX) 20 MG tablet Take 20 mg by mouth daily.    . traMADol (ULTRAM) 50 MG tablet Take 50 mg by mouth.    . triamcinolone cream (KENALOG) 0.1 % Apply 1 application topically 2 (two) times daily as needed (IRRITATION).     Marland Kitchen triamcinolone ointment (KENALOG) 0.5 % Apply 1 application topically 2 (two) times daily as needed (IRRITATION).     Marland Kitchen vitamin B-12 (CYANOCOBALAMIN) 1000 MCG tablet Take 1,000 mcg by mouth daily.    . meclizine (ANTIVERT) 25 MG tablet PLEASE SEE ATTACHED FOR DETAILED DIRECTIONS     No current facility-administered medications for this visit.      Musculoskeletal: Strength & Muscle Tone: within normal limits Gait & Station: unsteady, walking with cane Patient leans: none  Psychiatric Specialty  Exam: Review of Systems  Constitutional: Negative for chills, fever and malaise/fatigue.  Eyes: Positive for blurred vision and double vision. Negative for pain and discharge.  Respiratory: Negative for cough, sputum production and shortness of breath.     Blood pressure (!) 148/94, pulse 67,  temperature 98.2 F (36.8 C), height 5' 9.5" (1.765 m), weight 267 lb (121.1 kg).Body mass index is 38.86 kg/m.  General Appearance: Fairly Groomed  Eye Contact:  Good  Speech:  Clear and Coherent and Normal Rate  Volume:  Normal  Mood:  Anxious  Affect:  Full Range  Thought Process:  Goal Directed and Descriptions of Associations: Intact  Orientation:  Full (Time, Place, and Person)  Thought Content:  Logical  Suicidal Thoughts:  No  Homicidal Thoughts:  No  Memory:  Immediate;   Good  Judgement:  Good  Insight:  Good  Psychomotor Activity:  Normal  Concentration:  Concentration: Good  Recall:  Good  Fund of Knowledge:  Good  Language:  Good  Akathisia:  No  Handed:  Right  AIMS (if indicated):     Assets:  Communication Skills Desire for Improvement Financial Resources/Insurance Housing Leisure Time Resilience Social Support Talents/Skills Transportation  ADL's:  Intact  Cognition:  WNL  Sleep:   poor         Screenings: PHQ2-9     Patient Outreach Telephone from 08/30/2018 in [place] Patient Outreach Telephone from 06/02/2017 in [place] Patient Outreach Telephone from 03/10/2017 in [place]  PHQ-2 Total Score  0  0  0      I reviewed the information below on 10/28/2018 and have updated it Assessment and Plan: MDD-recurrent, moderate; Tourette's disorder; anxiety disorder unspecified   Medication management with supportive therapy. Risks and benefits, side effects and alternative treatment options discussed with patient. Pt was given an opportunity to ask questions about medication, illness, and treatment. All current psychiatric medications have been reviewed and discussed with the patient and adjusted as clinically appropriate. The patient has been provided an accurate and updated list of the medications being now prescribed. Patient expressed understanding of how their medications were to be used.  Pt  verbalized understanding and verbal consent obtained for treatment.  The risk of un-intended pregnancy is high based on the fact that pt reports she is not using any contraceptive and has irregular periods. Pt denies current sexual activity. Pt is aware that these meds carry a teratogenic risk. Pt will discuss plan of action if she does or plans to become pregnant in the future.  Status of current problems: increased anxiety, mood stable   Meds: Abilify 30 mg p.o. daily for MDD and Tourette's Wellbutrin XL 300 mg p.o. daily for MDD.  Higher doses caused hypertension in the past Increase Vistaril 10 mg p.o. 3 times daily as needed anxiety d/c Cogentin due to SE of constipation, urinary hesistancy  Labs: none  Therapy: brief supportive therapy provided. Discussed psychosocial stressors in detail.   Encouraged Phylisha to continue to use coping skills and keep a daily routine as ways to manage her anxiety and mood Discussed behavioral management of Tourette's I spent a significant amount of time educating [place] on the current pandemic of corona virus.  We discussed how the virus is transmitted, safety precautions including handwashing social distancing and limiting visits outside of the home as much as possible.  We discussed symptoms to monitor for and plan if symptoms develop.  She verbalized understanding  Consultations: Encouraged to  follow up with therapist Encouraged to follow up with PCP as needed  Pt denies SI and is at an acute low risk for suicide. Patient told to call clinic if any problems occur. Patient advised to go to ER if they should develop SI/HI, side effects, or if symptoms worsen. Has crisis numbers to call if needed. Pt verbalized understanding.  F/up in 80months or sooner if needed  Charlcie Cradle, MD 10/28/2018, 9:59 AM

## 2018-11-01 DIAGNOSIS — M5136 Other intervertebral disc degeneration, lumbar region: Secondary | ICD-10-CM | POA: Diagnosis not present

## 2018-11-01 DIAGNOSIS — S31109A Unspecified open wound of abdominal wall, unspecified quadrant without penetration into peritoneal cavity, initial encounter: Secondary | ICD-10-CM | POA: Diagnosis not present

## 2018-11-08 DIAGNOSIS — J310 Chronic rhinitis: Secondary | ICD-10-CM | POA: Diagnosis not present

## 2018-11-08 DIAGNOSIS — J4540 Moderate persistent asthma, uncomplicated: Secondary | ICD-10-CM | POA: Diagnosis not present

## 2018-11-08 DIAGNOSIS — J029 Acute pharyngitis, unspecified: Secondary | ICD-10-CM | POA: Diagnosis not present

## 2018-11-18 ENCOUNTER — Telehealth: Payer: Self-pay | Admitting: *Deleted

## 2018-11-18 NOTE — Telephone Encounter (Signed)
Called patient and advised due to current COVID 19 pandemic, our office is severely reducing in person visits in order to minimize the risk to our patients and healthcare providers. We recommend to convert your appointment to a video visit. She stated she does not have capability. We rescheduled her appt for June. She verbalized understanding, appreciation.

## 2018-12-02 DIAGNOSIS — L989 Disorder of the skin and subcutaneous tissue, unspecified: Secondary | ICD-10-CM | POA: Diagnosis not present

## 2018-12-02 DIAGNOSIS — F411 Generalized anxiety disorder: Secondary | ICD-10-CM | POA: Diagnosis not present

## 2018-12-02 DIAGNOSIS — I10 Essential (primary) hypertension: Secondary | ICD-10-CM | POA: Diagnosis not present

## 2018-12-02 DIAGNOSIS — F321 Major depressive disorder, single episode, moderate: Secondary | ICD-10-CM | POA: Diagnosis not present

## 2018-12-06 ENCOUNTER — Other Ambulatory Visit: Payer: Self-pay

## 2018-12-06 MED ORDER — ALBUTEROL SULFATE 108 (90 BASE) MCG/ACT IN AEPB: 2.0000 | INHALATION_SPRAY | RESPIRATORY_TRACT | 1 refills | Status: DC | PRN

## 2018-12-07 DIAGNOSIS — I499 Cardiac arrhythmia, unspecified: Secondary | ICD-10-CM | POA: Diagnosis not present

## 2018-12-07 DIAGNOSIS — R002 Palpitations: Secondary | ICD-10-CM | POA: Diagnosis not present

## 2018-12-10 ENCOUNTER — Ambulatory Visit: Payer: PPO | Admitting: Diagnostic Neuroimaging

## 2018-12-22 DIAGNOSIS — G4733 Obstructive sleep apnea (adult) (pediatric): Secondary | ICD-10-CM | POA: Diagnosis not present

## 2018-12-22 DIAGNOSIS — I878 Other specified disorders of veins: Secondary | ICD-10-CM | POA: Diagnosis not present

## 2018-12-22 DIAGNOSIS — R5381 Other malaise: Secondary | ICD-10-CM | POA: Diagnosis not present

## 2018-12-22 DIAGNOSIS — J4540 Moderate persistent asthma, uncomplicated: Secondary | ICD-10-CM | POA: Diagnosis not present

## 2018-12-22 DIAGNOSIS — K5909 Other constipation: Secondary | ICD-10-CM | POA: Diagnosis not present

## 2018-12-22 DIAGNOSIS — R5383 Other fatigue: Secondary | ICD-10-CM | POA: Diagnosis not present

## 2018-12-22 DIAGNOSIS — E782 Mixed hyperlipidemia: Secondary | ICD-10-CM | POA: Diagnosis not present

## 2018-12-22 DIAGNOSIS — N183 Chronic kidney disease, stage 3 (moderate): Secondary | ICD-10-CM | POA: Diagnosis not present

## 2018-12-22 DIAGNOSIS — M8949 Other hypertrophic osteoarthropathy, multiple sites: Secondary | ICD-10-CM | POA: Diagnosis not present

## 2018-12-22 DIAGNOSIS — E042 Nontoxic multinodular goiter: Secondary | ICD-10-CM | POA: Diagnosis not present

## 2018-12-22 DIAGNOSIS — I129 Hypertensive chronic kidney disease with stage 1 through stage 4 chronic kidney disease, or unspecified chronic kidney disease: Secondary | ICD-10-CM | POA: Diagnosis not present

## 2018-12-22 DIAGNOSIS — J310 Chronic rhinitis: Secondary | ICD-10-CM | POA: Diagnosis not present

## 2018-12-22 DIAGNOSIS — M5136 Other intervertebral disc degeneration, lumbar region: Secondary | ICD-10-CM | POA: Diagnosis not present

## 2018-12-23 DIAGNOSIS — F321 Major depressive disorder, single episode, moderate: Secondary | ICD-10-CM | POA: Diagnosis not present

## 2018-12-23 DIAGNOSIS — F411 Generalized anxiety disorder: Secondary | ICD-10-CM | POA: Diagnosis not present

## 2018-12-27 DIAGNOSIS — I499 Cardiac arrhythmia, unspecified: Secondary | ICD-10-CM | POA: Diagnosis not present

## 2018-12-27 DIAGNOSIS — R002 Palpitations: Secondary | ICD-10-CM | POA: Diagnosis not present

## 2018-12-30 DIAGNOSIS — K219 Gastro-esophageal reflux disease without esophagitis: Secondary | ICD-10-CM | POA: Insufficient documentation

## 2018-12-30 DIAGNOSIS — G894 Chronic pain syndrome: Secondary | ICD-10-CM | POA: Diagnosis not present

## 2018-12-30 DIAGNOSIS — M792 Neuralgia and neuritis, unspecified: Secondary | ICD-10-CM | POA: Insufficient documentation

## 2018-12-30 DIAGNOSIS — M5136 Other intervertebral disc degeneration, lumbar region: Secondary | ICD-10-CM | POA: Diagnosis not present

## 2019-01-06 ENCOUNTER — Encounter: Payer: Self-pay | Admitting: *Deleted

## 2019-01-06 NOTE — Addendum Note (Signed)
Addended by: Minna Antis on: 01/06/2019 10:43 AM   Modules accepted: Orders

## 2019-01-06 NOTE — Telephone Encounter (Signed)
Called patient and advised since she needs in office visit we have to reschedule to a Tues afternoon.  We rescheduled; I explained procedure for checking in, asked Covid questions and updated her EMR. She stated if her ride cannot bring her she'll call and reschedule to another Tues. She had no questions,  verbalized understanding, appreciation.

## 2019-01-11 ENCOUNTER — Ambulatory Visit: Payer: Self-pay | Admitting: Diagnostic Neuroimaging

## 2019-01-11 NOTE — Telephone Encounter (Signed)
Pt called while on the road trying to locate the office. Phone rep checked with RN who stated Dr Leta Baptist would not be able to see pt later than scheduled appointment time due to full schedule this afternoon.  Pt has accepted 06-16 for a 2pm appointment time, pt advised to check in at 1:30pm

## 2019-01-12 DIAGNOSIS — F321 Major depressive disorder, single episode, moderate: Secondary | ICD-10-CM | POA: Diagnosis not present

## 2019-01-12 DIAGNOSIS — F411 Generalized anxiety disorder: Secondary | ICD-10-CM | POA: Diagnosis not present

## 2019-01-14 DIAGNOSIS — L309 Dermatitis, unspecified: Secondary | ICD-10-CM | POA: Diagnosis not present

## 2019-01-18 ENCOUNTER — Other Ambulatory Visit (HOSPITAL_COMMUNITY): Payer: Self-pay | Admitting: Psychiatry

## 2019-01-18 DIAGNOSIS — F952 Tourette's disorder: Secondary | ICD-10-CM

## 2019-01-18 DIAGNOSIS — F332 Major depressive disorder, recurrent severe without psychotic features: Secondary | ICD-10-CM

## 2019-01-20 ENCOUNTER — Other Ambulatory Visit (HOSPITAL_COMMUNITY): Payer: Self-pay | Admitting: Psychiatry

## 2019-01-20 DIAGNOSIS — F411 Generalized anxiety disorder: Secondary | ICD-10-CM

## 2019-01-21 ENCOUNTER — Ambulatory Visit: Payer: Self-pay | Admitting: Diagnostic Neuroimaging

## 2019-01-25 ENCOUNTER — Ambulatory Visit (INDEPENDENT_AMBULATORY_CARE_PROVIDER_SITE_OTHER): Payer: PPO | Admitting: Diagnostic Neuroimaging

## 2019-01-25 ENCOUNTER — Other Ambulatory Visit: Payer: Self-pay

## 2019-01-25 ENCOUNTER — Encounter: Payer: Self-pay | Admitting: Diagnostic Neuroimaging

## 2019-01-25 VITALS — BP 163/98 | HR 92 | Temp 95.0°F | Ht 69.5 in | Wt 272.0 lb

## 2019-01-25 DIAGNOSIS — R251 Tremor, unspecified: Secondary | ICD-10-CM | POA: Diagnosis not present

## 2019-01-25 NOTE — Progress Notes (Signed)
[facility]  PATIENT: Penny Porter DOB: 1965/11/15  REFERRING CLINICIAN: M Brown-Patram HISTORY FROM: patient  REASON FOR VISIT: new consult   HISTORICAL  CHIEF COMPLAINT:  Chief Complaint  Patient presents with  . Vertigo, tremors    rm 6, New pt, friend- [name]    HISTORY OF PRESENT ILLNESS:   53 year old female here for evaluation of tremors.  Patient reports approximately 2 months of intermittent postural tremors in her left hand when she holds objects.  She has also noticed that her fingers and hands tend to "lock up" at random times.  Patient has significant psychiatry history cutting anxiety, depression, Tourette's syndrome.  Patient has been on risperidone and Abilify.  She had developed some tremors in her legs and was prescribed Cogentin in the past but stopped this more recently.  Patient has had some dizziness and vertigo but these have resolved.   REVIEW OF SYSTEMS: Full 14 system review of systems performed and negative with exception of: As per HPI.  ALLERGIES: Allergies  Allergen Reactions  . Amoxicillin Hives and Swelling    LIP SWELLING PATIENT HAS HAD A PCN REACTION WITH IMMEDIATE RASH, FACIAL/TONGUE/THROAT SWELLING, SOB, OR LIGHTHEADEDNESS WITH HYPOTENSION:  #  #  #  YES  #  #  #   HAS PT DEVELOPED SEVERE RASH INVOLVING MUCUS MEMBRANES or SKIN NECROSIS: #  #  #  YES  #  #  #  Has patient had a PCN reaction that required hospitalization: No Has patient had a PCN reaction occurring within the last 10 years: Unknown If all of the above answers are "NO", then may proceed with Cephalosporin use.   . Avelox [Moxifloxacin Hcl In Nacl] Other (See Comments)    Caused pain in shoulders to finger, tendonitis  . Moxifloxacin Other (See Comments)    Caused pain in shoulders to finger, tendonitis  . Adhesive [Tape] Dermatitis    Blisters skin  . Bextra [Valdecoxib] Hives and Swelling    SWELLING REACTION UNSPECIFIED   . Doxycycline Other (See  Comments)  . Doxycycline Hyclate Hives and Swelling    SWELLING REACTION UNSPECIFIED   . Gadobutrol Hives  . Gadolinium Derivatives Hives  . Gadoversetamide Hives  . Iodinated Diagnostic Agents Hives  . Lamictal [Lamotrigine] Hives  . Naproxen Hypertension  . Oxycodone-Acetaminophen Other (See Comments)  . Percolone [Oxycodone] Hives  . Relafen [Nabumetone] Hypertension  . Risperdal [Risperidone] Other (See Comments)    Leg weakness   . Fetzima [Levomilnacipran] Other (See Comments)    Tremors across head   . Nitrofurantoin Other (See Comments)    UNSPECIFIED REACTION   . Brintellix [Vortioxetine] Nausea And Vomiting    HOME MEDICATIONS: Outpatient Medications Prior to Visit  Medication Sig Dispense Refill  . Acetaminophen (TYLENOL PO) Take 1 tablet by mouth daily as needed (PAIN).    Marland Kitchen albuterol (PROVENTIL) (2.5 MG/3ML) 0.083% nebulizer solution PLACE 1 VIAL IN NEBULIZER EVERY 6 HOURS AS NEEDED FOR WHEEZING UP TO 30 DAYS  5  . Albuterol Sulfate (PROAIR RESPICLICK) 299 (90 Base) MCG/ACT AEPB Inhale 2 puffs into the lungs every 4 (four) hours as needed. 1 each 1  . allopurinol (ZYLOPRIM) 100 MG tablet Take by mouth.    . ARIPiprazole (ABILIFY) 30 MG tablet TAKE 1 TABLET BY MOUTH EVERY DAY 90 tablet 0  . aspirin 81 MG tablet Take 81 mg by mouth daily.    Marland Kitchen azithromycin (ZITHROMAX) 250 MG tablet Take one tablet by mouth 3 times weekly 12 tablet  3  . budesonide-formoterol (SYMBICORT) 80-4.5 MCG/ACT inhaler INHALE TWO PUFFS USING SPACER 2 X DAILY TO PREVENT COUGH OR WHEEZE.RINSE, GARGLE, AND SPIT AFTER USE 10.2 Inhaler 5  . buPROPion (WELLBUTRIN XL) 300 MG 24 hr tablet TAKE 1 TABLET (300 MG TOTAL) BY MOUTH EVERY MORNING. 90 tablet 0  . Cholecalciferol (VITAMIN D-3) 5000 UNITS TABS Take 5,000 Units by mouth daily.     . clotrimazole-betamethasone (LOTRISONE) cream APPLY SPARINGLY TO THE AFFECTED AREA(S) TWICE DAILY AS NEEDED FOR IRRITATION  5  . estradiol (ESTRACE) 0.1 MG/GM vaginal  cream Place 1 Applicatorful vaginally at bedtime.    . hydrOXYzine (ATARAX/VISTARIL) 10 MG tablet TAKE 1 TABLET (10 MG TOTAL) BY MOUTH 3 (THREE) TIMES DAILY AS NEEDED FOR ANXIETY. 270 tablet 0  . ipratropium (ATROVENT) 0.03 % nasal spray USE 2 SPRAYS IN NOSTRIL (S) 2 TIMES DAILY AS DIRECTED  11  . ketoconazole (NIZORAL) 2 % shampoo Apply 1 application topically as needed for irritation.     Marland Kitchen loratadine (CLARITIN) 10 MG tablet Take 10 mg by mouth at bedtime.     Marland Kitchen lubiprostone (AMITIZA) 24 MCG capsule Take 24 mcg by mouth daily as needed for constipation.     . meclizine (ANTIVERT) 25 MG tablet PLEASE SEE ATTACHED FOR DETAILED DIRECTIONS    . meloxicam (MOBIC) 7.5 MG tablet Take 7.5 mg by mouth 2 (two) times daily.  0  . metoprolol succinate (TOPROL-XL) 25 MG 24 hr tablet Take by mouth.    . montelukast (SINGULAIR) 10 MG tablet Take 10 mg by mouth daily.  5  . mupirocin ointment (BACTROBAN) 2 % Place 1 application into the nose 2 (two) times daily as needed (IRRITATION).     Marland Kitchen ondansetron (ZOFRAN-ODT) 4 MG disintegrating tablet Take 4 mg by mouth 2 (two) times daily as needed for nausea or vomiting.    . pregabalin (LYRICA) 75 MG capsule Take 75 mg by mouth 2 (two) times daily. Reported on 10/09/2015    . RABEprazole (ACIPHEX) 20 MG tablet Take 20 mg by mouth daily.    . ranitidine (ZANTAC) 300 MG tablet Take 300 mg by mouth at bedtime.    Marland Kitchen spironolactone (ALDACTONE) 25 MG tablet Take 25 mg by mouth daily.    Marland Kitchen torsemide (DEMADEX) 20 MG tablet Take 20 mg by mouth daily.    . traMADol (ULTRAM) 50 MG tablet Take 50 mg by mouth.    . triamcinolone cream (KENALOG) 0.1 % Apply 1 application topically 2 (two) times daily as needed (IRRITATION).     Marland Kitchen triamcinolone ointment (KENALOG) 0.5 % Apply 1 application topically 2 (two) times daily as needed (IRRITATION).     Marland Kitchen vitamin B-12 (CYANOCOBALAMIN) 1000 MCG tablet Take 1,000 mcg by mouth daily.     No facility-administered medications prior to visit.      PAST MEDICAL HISTORY: Past Medical History:  Diagnosis Date  . Anxiety   . Arthritis    Back and hip  . Asthma   . Bursitis   . Complication of anesthesia    bladder hard to wake up, never needed a in and out cath  . Costochondral chest pain   . Degenerative disk disease   . Depression   . Gastroparesis   . GERD (gastroesophageal reflux disease)   . Hypertension   . Lumbar herniated disc   . Sleep apnea    not using CPAP  . Stenosis of lumbosacral spine   . Stress fracture of foot   . Thyroid nodule   .  Tourette disease   . Trigeminal nerve disease     PAST SURGICAL HISTORY: Past Surgical History:  Procedure Laterality Date  . BACK SURGERY  September 19, 2015  . bone spur removal on back    . CHOLECYSTECTOMY  2005  . COLONOSCOPY    . ELBOW SURGERY Left May 29, 2014  . ESOPHAGOGASTRODUODENOSCOPY    . KNEE SURGERY  06/2016  . polyp removal    . POSTERIOR FUSION LUMBAR SPINE  06/26/2017  . ruptured disk  2008  . TEAR DUCT PROBING  2009    FAMILY HISTORY: Family History  Problem Relation Age of Onset  . Mental retardation Sister   . Dementia Father   . Cancer Mother   . Suicidality Neg Hx     SOCIAL HISTORY: Social History   Socioeconomic History  . Marital status: Single    Spouse name: Not on file  . Number of children: Not on file  . Years of education: Not on file  . Highest education level: High school graduate  Occupational History    Comment: disabled   Social Needs  . Financial resource strain: Not on file  . Food insecurity    Worry: Not on file    Inability: Not on file  . Transportation needs    Medical: Not on file    Non-medical: Not on file  Tobacco Use  . Smoking status: Never Smoker  . Smokeless tobacco: Never Used  Substance and Sexual Activity  . Alcohol use: No  . Drug use: No  . Sexual activity: Not Currently  Lifestyle  . Physical activity    Days per week: Not on file    Minutes per session: Not on file  . Stress:  Not on file  Relationships  . Social Herbalist on phone: Not on file    Gets together: Not on file    Attends religious service: Not on file    Active member of club or organization: Not on file    Attends meetings of clubs or organizations: Not on file    Relationship status: Not on file  . Intimate partner violence    Fear of current or ex partner: Not on file    Emotionally abused: Not on file    Physically abused: Not on file    Forced sexual activity: Not on file  Other Topics Concern  . Not on file  Social History Narrative   Lives with 2 sisters   Caffeine- none     PHYSICAL EXAM  GENERAL EXAM/CONSTITUTIONAL: Vitals:  Vitals:   01/25/19 1408  BP: (!) 163/98  Pulse: 92  Temp: (!) 95 F (35 C)  Weight: 272 lb (123.4 kg)  Height: 5' 9.5" (1.765 m)     Body mass index is 39.59 kg/m. Wt Readings from Last 3 Encounters:  01/25/19 272 lb (123.4 kg)  10/28/18 267 lb (121.1 kg)  07/29/18 270 lb (122.5 kg)     Patient is in no distress; well developed, nourished and groomed; neck is supple  CARDIOVASCULAR:  Examination of carotid arteries is normal; no carotid bruits  Regular rate and rhythm, no murmurs  Examination of peripheral vascular system by observation and palpation is normal  EYES:  Ophthalmoscopic exam of optic discs and posterior segments is normal; no papilledema or hemorrhages  No exam data present  MUSCULOSKELETAL:  Gait, strength, tone, movements noted in Neurologic exam below  NEUROLOGIC: MENTAL STATUS:  No flowsheet data found.  awake, alert,  oriented to person, place and time  recent and remote memory intact  normal attention and concentration  language fluent, comprehension intact, naming intact  fund of knowledge appropriate  CRANIAL NERVE:   2nd - no papilledema on fundoscopic exam  2nd, 3rd, 4th, 6th - pupils equal and reactive to light, visual fields full to confrontation, extraocular muscles intact, no  nystagmus  5th - facial sensation symmetric  7th - facial strength symmetric  8th - hearing intact  9th - palate elevates symmetrically, uvula midline  11th - shoulder shrug symmetric  12th - tongue protrusion midline  MOTOR:   MINIMAL POSTURAL TREMOR IN LUE  MILD RIGIDITY IN BUE; MILD BRADYKINESIA IN LUE  full strength in the BUE, BLE  SENSORY:   normal and symmetric to light touch; DECR VIB AT TOES  COORDINATION:   finger-nose-finger, fine finger movements --> SLOW  REFLEXES:   deep tendon reflexes TRACE and symmetric  GAIT/STATION:   USES CANE; DECR ARM SWING; SHORT STEPS    DIAGNOSTIC DATA (LABS, IMAGING, TESTING) - I reviewed patient records, labs, notes, testing and imaging myself where available.  Lab Results  Component Value Date   WBC 9.0 06/18/2017   HGB 14.6 06/18/2017   HCT 43.2 06/18/2017   MCV 94.3 06/18/2017   PLT 212 06/18/2017      Component Value Date/Time   NA 138 06/18/2017 1051   K 3.9 06/18/2017 1051   CL 106 06/18/2017 1051   CO2 27 06/18/2017 1051   GLUCOSE 76 06/18/2017 1051   BUN 14 06/18/2017 1051   CREATININE 0.97 06/18/2017 1051   CALCIUM 9.0 06/18/2017 1051   GFRNONAA >60 06/18/2017 1051   GFRAA >60 06/18/2017 1051   No results found for: CHOL, HDL, LDLCALC, LDLDIRECT, TRIG, CHOLHDL No results found for: HGBA1C No results found for: VITAMINB12 No results found for: TSH     ASSESSMENT AND PLAN  53 y.o. year old female here with mild postural tremor in left upper extremity, mild rigidity and bradykinesia.  Could represent essential tremor versus parkinsonism (drug-induced versus idiopathic).  Dx:  1. Tremor     PLAN:  - check MRI brain (rule out stroke, inflamm, autoimmune) - may consider empiric trial of carb/levo in future if tremors worsen  Orders Placed This Encounter  Procedures  . MR BRAIN W WO CONTRAST   Return pending test results, for pending if symptoms worsen or fail to improve.     Penni Bombard, MD 02/17/2956, 4:73 PM Certified in Neurology, Neurophysiology and Neuroimaging  [facility] [address], [place], [place] [address] [phone]

## 2019-01-26 ENCOUNTER — Telehealth: Payer: Self-pay | Admitting: Diagnostic Neuroimaging

## 2019-01-26 NOTE — Telephone Encounter (Signed)
health team order sent to GI.. no Josem Kaufmann they will reach out to the patient to schedule.

## 2019-01-27 ENCOUNTER — Telehealth: Payer: Self-pay

## 2019-01-27 NOTE — Telephone Encounter (Signed)
Spoke with patient after calling in her 13hr prep to her [facility] in [place].  She was informed she will need to take Prednisone 50mg  PO 7/13 @ 2330, 7/14 @ 0530 and 1130.  Also, Benadryl 50mg  PO 7/14 @ 1130.  I confirmed with Judson Roch in MRI that she does not need to wear her face mask in the scanner (she has asthma and is claustrophobic).

## 2019-02-02 DIAGNOSIS — F411 Generalized anxiety disorder: Secondary | ICD-10-CM | POA: Diagnosis not present

## 2019-02-02 DIAGNOSIS — F321 Major depressive disorder, single episode, moderate: Secondary | ICD-10-CM | POA: Diagnosis not present

## 2019-02-03 ENCOUNTER — Other Ambulatory Visit: Payer: Self-pay

## 2019-02-03 ENCOUNTER — Ambulatory Visit (INDEPENDENT_AMBULATORY_CARE_PROVIDER_SITE_OTHER): Payer: PPO | Admitting: Psychiatry

## 2019-02-03 ENCOUNTER — Encounter (HOSPITAL_COMMUNITY): Payer: Self-pay | Admitting: Psychiatry

## 2019-02-03 DIAGNOSIS — F952 Tourette's disorder: Secondary | ICD-10-CM | POA: Diagnosis not present

## 2019-02-03 DIAGNOSIS — F411 Generalized anxiety disorder: Secondary | ICD-10-CM | POA: Diagnosis not present

## 2019-02-03 DIAGNOSIS — F332 Major depressive disorder, recurrent severe without psychotic features: Secondary | ICD-10-CM

## 2019-02-03 MED ORDER — ARIPIPRAZOLE 30 MG PO TABS: 30.0000 mg | ORAL_TABLET | Freq: Every day | ORAL | 0 refills | Status: DC

## 2019-02-03 MED ORDER — BUPROPION HCL ER (XL) 300 MG PO TB24: ORAL_TABLET | ORAL | 0 refills | Status: DC

## 2019-02-03 MED ORDER — HYDROXYZINE HCL 10 MG PO TABS: 10.0000 mg | ORAL_TABLET | Freq: Three times a day (TID) | ORAL | 0 refills | Status: DC | PRN

## 2019-02-03 NOTE — Progress Notes (Signed)
Virtual Visit via Telephone Note  I connected with Penny Porter on 02/03/19 at  9:30 AM EDT by telephone and verified that I am speaking with the correct person using two identifiers.  Location: Patient: home Provider: office   I discussed the limitations, risks, security and privacy concerns of performing an evaluation and management service by telephone and the availability of in person appointments. I also discussed with the patient that there may be a patient responsible charge related to this service. The patient expressed understanding and agreed to proceed.   History of Present Illness: Pt reports she is having some shaking in her left hand. She went to neurologist on June 16th. They suspect Parkinson's or Parkinson's like symptoms due to Abilify. Pt does not want to stop Abilify because it helps with mood and Tourette's.  She has a MRI on July 14. Tourette's happens when she is stressed. It is mostly abdominal. It happens a couple of times a month. It is hard to walk due to pain. She can't do much physically and that is frustrating. She is also frustrated because she can't go out much due to [place]. Natalyia does try to keep busy at home with coloring and crosswords. Depression is "fair for the most part". She denies SI/HI. Pt is not sleeping well due to early morning awakening. She is getting 4-6 hrs. She does not nap during the day.    Observations/Objective: I spoke with Penny Porter on the phone.  Pt was calm, pleasant and cooperative.  Pt was engaged in the conversation and answered questions appropriately.  Speech was clear and coherent with normal rate, tone and volume.  Mood is depressed and anxious, affect is constricted. Thought processes are coherent and intact.  Thought content is with ruminations.  Pt denies SI/HI.   Pt denies auditory and visual hallucinations and did not appear to be responding to internal stimuli.  Memory and concentration are good.  Fund of knowledge and use of  language are average.  Insight and judgment are fair.  I am unable to comment on psychomotor activity, general appearance, hygiene, or eye contact as I was unable to physically see the patient on the phone.   Assessment and Plan: MDD- recurrent, moderate; Tourette's d/o; GAD  Abilify 30mg  po qD-neurologist is concerned that patient may be having Parkinson's like symptoms due to Abilify use.  Will wait for MRI and neurology follow-up to make any medication changes/recommendations.  Wellbutrin XL 300mg  po qD- higher doses causes HTN  Vistaril 10mg  po TID prn anxiety  Labs- done at PCP in April 2020. Will request (PCP- Dr. Cheryll Cockayne at [facility] in [place])  Follow Up Instructions: 2 mo or sooner if needed   I discussed the assessment and treatment plan with the patient. The patient was provided an opportunity to ask questions and all were answered. The patient agreed with the plan and demonstrated an understanding of the instructions.   The patient was advised to call back or seek an in-person evaluation if the symptoms worsen or if the condition fails to improve as anticipated.  I provided 20 minutes of non-face-to-face time during this encounter.   Charlcie Cradle, MD

## 2019-02-04 DIAGNOSIS — G4733 Obstructive sleep apnea (adult) (pediatric): Secondary | ICD-10-CM | POA: Diagnosis not present

## 2019-02-04 DIAGNOSIS — R000 Tachycardia, unspecified: Secondary | ICD-10-CM | POA: Diagnosis not present

## 2019-02-04 DIAGNOSIS — J4540 Moderate persistent asthma, uncomplicated: Secondary | ICD-10-CM | POA: Diagnosis not present

## 2019-02-04 DIAGNOSIS — I10 Essential (primary) hypertension: Secondary | ICD-10-CM | POA: Diagnosis not present

## 2019-02-14 ENCOUNTER — Other Ambulatory Visit: Payer: Self-pay | Admitting: Allergy and Immunology

## 2019-02-14 NOTE — Telephone Encounter (Signed)
Please refill this prescription

## 2019-02-14 NOTE — Telephone Encounter (Signed)
Can we refill? Please advise.

## 2019-02-21 ENCOUNTER — Ambulatory Visit (INDEPENDENT_AMBULATORY_CARE_PROVIDER_SITE_OTHER): Payer: PPO | Admitting: Allergy and Immunology

## 2019-02-21 ENCOUNTER — Other Ambulatory Visit: Payer: Self-pay

## 2019-02-21 ENCOUNTER — Encounter: Payer: Self-pay | Admitting: Allergy and Immunology

## 2019-02-21 VITALS — BP 122/88 | HR 84 | Temp 98.9°F | Resp 20

## 2019-02-21 DIAGNOSIS — K219 Gastro-esophageal reflux disease without esophagitis: Secondary | ICD-10-CM | POA: Diagnosis not present

## 2019-02-21 DIAGNOSIS — J4540 Moderate persistent asthma, uncomplicated: Secondary | ICD-10-CM | POA: Diagnosis not present

## 2019-02-21 DIAGNOSIS — J3089 Other allergic rhinitis: Secondary | ICD-10-CM | POA: Diagnosis not present

## 2019-02-21 NOTE — Patient Instructions (Addendum)
  1. Exchange Symbicort  For Asmanex 200 - 2 inhalations 2 times a day with spacer  2. Continue montelukast 10mg  one tablet one time per day  3. Continue famotidine 20 mg 2 times per day   4. Continue OTC Nasacort one spray each 1 time per day during upper airway symptoms  5. Continue ProAir HFA or similar and antihistamine and nasal ipratropium if needed  6.  Return to clinic in 4 weeks or earlier if problem

## 2019-02-21 NOTE — Progress Notes (Signed)
[place] [facility] [place] [facility] [employer]   Follow-up Note  Referring Provider: Raina Mina., MD Primary Provider: Raina Mina., MD Date of Office Visit: 02/21/2019  Subjective:   Penny Porter (DOB: Jan 03, 1966) is a 53 y.o. female who returns to the [facility] on 02/21/2019 in re-evaluation of the following:  HPI: Penny Porter returns to this clinic in reevaluation of asthma and allergic rhinitis and LPR.  Her last visit to this clinic was 23 August 2018.  She states that her asthma has been a little bit more active.  Currently this is an intermittent issue and she can cycle through a month where she does well and a month where she does not do well.  She has been using her rescue inhaler just about every day for issues with "cannot breathe".  She is now using her rescue inhaler daily which helps her very much.  This continues to be a problem even though she uses her Symbicort on a regular basis.  She has not really been having much problems with her nose while intermittently using a nasal steroid and using montelukast on a regular basis.  She believes that her reflux is under excellent control at this point time while using Pepcid 20 mg twice a day.  She no longer uses a proton pump inhibitor.  Jhade informs me that she has been having issues with palpitations and apparently she saw her cardiologist and had a cardiac monitor placed which did not show much heart irritability.  As well, she has been having problems with tremor and she is scheduled to get an MRI of her brain in investigation for possible Parkinson's disease.  Allergies as of 02/21/2019      Reactions   Amoxicillin Hives, Swelling   LIP SWELLING PATIENT HAS HAD A PCN REACTION WITH IMMEDIATE RASH, FACIAL/TONGUE/THROAT SWELLING, SOB, OR LIGHTHEADEDNESS WITH HYPOTENSION:  #  #  #  YES  #  #  #   HAS PT DEVELOPED SEVERE RASH INVOLVING MUCUS MEMBRANES or SKIN NECROSIS: #  #  #  YES  #  #  #  Has  patient had a PCN reaction that required hospitalization: No Has patient had a PCN reaction occurring within the last 10 years: Unknown If all of the above answers are "NO", then may proceed with Cephalosporin use.   Avelox [moxifloxacin Hcl In Nacl] Other (See Comments)   Caused pain in shoulders to finger, tendonitis   Adhesive [tape] Dermatitis   Blisters skin   Bextra [valdecoxib] Hives, Swelling   Doxycycline Other (See Comments)   Doxycycline Hyclate Hives, Swelling   Gadobutrol Hives   Gadolinium Derivatives Hives   Gadoversetamide Hives   Iodinated Diagnostic Agents Hives   Lamictal [lamotrigine] Hives   Naproxen Hypertension   Oxycodone-acetaminophen Other (See Comments)   Percolone [oxycodone] Hives   Relafen [nabumetone] Hypertension   Risperdal [risperidone] Other (See Comments)   Leg weakness   Nitrofurantoin Other (See Comments)   Brintellix [vortioxetine] Nausea And Vomiting   Fetzima [levomilnacipran] Other (See Comments)   Tremors across head   Moxifloxacin Other (See Comments)   Caused pain in shoulders to finger, tendonitis      Medication List      albuterol (2.5 MG/3ML) 0.083% nebulizer solution Commonly known as: PROVENTIL PLACE 1 VIAL IN NEBULIZER EVERY 6 HOURS AS NEEDED FOR WHEEZING UP TO 30 DAYS   Albuterol Sulfate 108 (90 Base) MCG/ACT Aepb Commonly known as: ProAir RespiClick Inhale 2  puffs into the lungs every 4 (four) hours as needed.   allopurinol 100 MG tablet Commonly known as: ZYLOPRIM Take by mouth.   ARIPiprazole 30 MG tablet Commonly known as: ABILIFY Take 1 tablet (30 mg total) by mouth daily.   aspirin 81 MG tablet Take 81 mg by mouth daily.   azithromycin 250 MG tablet Commonly known as: ZITHROMAX TAKE ONE TABLET BY MOUTH 3 TIMES WEEKLY   budesonide-formoterol 80-4.5 MCG/ACT inhaler Commonly known as: Symbicort INHALE TWO PUFFS USING SPACER 2 X DAILY TO PREVENT COUGH OR WHEEZE.RINSE, GARGLE, AND SPIT AFTER USE    buPROPion 300 MG 24 hr tablet Commonly known as: WELLBUTRIN XL TAKE 1 TABLET (300 MG TOTAL) BY MOUTH EVERY MORNING.   clotrimazole-betamethasone cream Commonly known as: LOTRISONE APPLY SPARINGLY TO THE AFFECTED AREA(S) TWICE DAILY AS NEEDED FOR IRRITATION   estradiol 0.1 MG/GM vaginal cream Commonly known as: ESTRACE Place 1 Applicatorful vaginally as needed.   famotidine 10 MG tablet Commonly known as: PEPCID Take 10 mg by mouth 2 (two) times daily.   hydrOXYzine 10 MG tablet Commonly known as: ATARAX/VISTARIL Take 1 tablet (10 mg total) by mouth 3 (three) times daily as needed for anxiety.   ipratropium 0.03 % nasal spray Commonly known as: ATROVENT USE 2 SPRAYS IN NOSTRIL (S) 2 TIMES DAILY AS DIRECTED   ketoconazole 2 % shampoo Commonly known as: NIZORAL Apply 1 application topically as needed for irritation.   loratadine 10 MG tablet Commonly known as: CLARITIN Take 10 mg by mouth at bedtime.   lubiprostone 24 MCG capsule Commonly known as: AMITIZA Take 24 mcg by mouth daily as needed for constipation.   meclizine 25 MG tablet Commonly known as: ANTIVERT PLEASE SEE ATTACHED FOR DETAILED DIRECTIONS   meloxicam 7.5 MG tablet Commonly known as: MOBIC Take 7.5 mg by mouth 2 (two) times daily.   metoprolol succinate 25 MG 24 hr tablet Commonly known as: TOPROL-XL Take by mouth.   montelukast 10 MG tablet Commonly known as: SINGULAIR Take 10 mg by mouth daily.   mupirocin ointment 2 % Commonly known as: BACTROBAN Place 1 application into the nose 2 (two) times daily as needed (IRRITATION).   ondansetron 4 MG disintegrating tablet Commonly known as: ZOFRAN-ODT Take 4 mg by mouth 2 (two) times daily as needed for nausea or vomiting.   pregabalin 75 MG capsule Commonly known as: LYRICA Take 75 mg by mouth 2 (two) times daily. Reported on 10/09/2015   RABEprazole 20 MG tablet Commonly known as: ACIPHEX Take 20 mg by mouth daily.   spironolactone 25 MG  tablet Commonly known as: ALDACTONE Take 25 mg by mouth daily.   torsemide 20 MG tablet Commonly known as: DEMADEX Take 20 mg by mouth daily.   traMADol 50 MG tablet Commonly known as: ULTRAM Take 50 mg by mouth.   triamcinolone cream 0.1 % Commonly known as: KENALOG Apply 1 application topically 2 (two) times daily as needed (IRRITATION).   triamcinolone ointment 0.5 % Commonly known as: KENALOG Apply 1 application topically 2 (two) times daily as needed (IRRITATION).   TYLENOL PO Take 1 tablet by mouth daily as needed (PAIN).   vitamin B-12 1000 MCG tablet Commonly known as: CYANOCOBALAMIN Take 1,000 mcg by mouth daily.   Vitamin D-3 125 MCG (5000 UT) Tabs Take 5,000 Units by mouth daily.       Past Medical History:  Diagnosis Date  . Anxiety   . Arthritis    Back and hip  . Asthma   .  Bursitis   . Complication of anesthesia    bladder hard to wake up, never needed a in and out cath  . Costochondral chest pain   . Degenerative disk disease   . Depression   . Gastroparesis   . GERD (gastroesophageal reflux disease)   . Hypertension   . Lumbar herniated disc   . Sleep apnea    not using CPAP  . Stenosis of lumbosacral spine   . Stress fracture of foot   . Thyroid nodule   . Tourette disease   . Trigeminal nerve disease     Past Surgical History:  Procedure Laterality Date  . BACK SURGERY  September 19, 2015  . bone spur removal on back    . CHOLECYSTECTOMY  2005  . COLONOSCOPY    . ELBOW SURGERY Left May 29, 2014  . ESOPHAGOGASTRODUODENOSCOPY    . KNEE SURGERY  06/2016  . polyp removal    . POSTERIOR FUSION LUMBAR SPINE  06/26/2017  . ruptured disk  2008  . TEAR DUCT PROBING  2009    Review of systems negative except as noted in HPI / PMHx or noted below:  Review of Systems  Constitutional: Negative.   HENT: Negative.   Eyes: Negative.   Respiratory: Negative.   Cardiovascular: Negative.   Gastrointestinal: Negative.   Genitourinary:  Negative.   Musculoskeletal: Negative.   Skin: Negative.   Neurological: Negative.   Endo/Heme/Allergies: Negative.   Psychiatric/Behavioral: Negative.      Objective:   Vitals:   02/21/19 1759  BP: 122/88  Pulse: 84  Resp: 20  Temp: 98.9 F (37.2 C)  SpO2: 96%          Physical Exam Constitutional:      Appearance: She is not diaphoretic.  HENT:     Head: Normocephalic.     Right Ear: Tympanic membrane, ear canal and external ear normal.     Left Ear: Tympanic membrane, ear canal and external ear normal.     Nose: Nose normal. No mucosal edema or rhinorrhea.     Mouth/Throat:     Pharynx: Uvula midline. No oropharyngeal exudate.  Eyes:     Conjunctiva/sclera: Conjunctivae normal.  Neck:     Thyroid: No thyromegaly.     Trachea: Trachea normal. No tracheal tenderness or tracheal deviation.  Cardiovascular:     Rate and Rhythm: Normal rate and regular rhythm.     Heart sounds: Normal heart sounds, S1 normal and S2 normal. No murmur.  Pulmonary:     Effort: No respiratory distress.     Breath sounds: Normal breath sounds. No stridor. No wheezing or rales.  Lymphadenopathy:     Head:     Right side of head: No tonsillar adenopathy.     Left side of head: No tonsillar adenopathy.     Cervical: No cervical adenopathy.  Skin:    Findings: No erythema or rash.     Nails: There is no clubbing.   Neurological:     Mental Status: She is alert.     Diagnostics:    Spirometry was performed and demonstrated an FEV1 of 1.66 at 50 % of predicted.  She had a less than optimal effort on the spirometric maneuver.  Assessment and Plan:   1. Asthma, moderate persistent, well-controlled   2. Other allergic rhinitis   3. LPRD (laryngopharyngeal reflux disease)     1. Exchange Symbicort  For Asmanex 200 - 2 inhalations 2 times a day with spacer  2. Continue montelukast 10mg  one tablet one time per day  3. Continue famotidine 20 mg 2 times per day   4. Continue OTC  Nasacort one spray each 1 time per day during upper airway symptoms  5. Continue ProAir HFA or similar and antihistamine and nasal ipratropium if needed  6.  Return to clinic in 4 weeks or earlier if problem   Nini appears to have some respiratory tract symptoms but is not entirely clear what is going on.  She has anxiety and other issues and sometimes these issues get tied up with her respiratory tract symptoms.  Because she is having some irritable heart symptoms I am going to remove her long-acting bronchodilator and start her on Asmanex where she continues on a leukotriene modifier.  Her reflux will continue to be treated with famotidine on a regular basis.  I will see her back in this clinic in 4 weeks to assess her response to this approach.  She will contact me to the interval should to be a problem.  Allena Katz, MD Allergy / Immunology [place]

## 2019-02-22 ENCOUNTER — Encounter: Payer: Self-pay | Admitting: Allergy and Immunology

## 2019-02-22 ENCOUNTER — Ambulatory Visit
Admission: RE | Admit: 2019-02-22 | Discharge: 2019-02-22 | Disposition: A | Discharge status: Home / Self Care | Payer: PPO | Source: Ambulatory Visit | Attending: Diagnostic Neuroimaging | Admitting: Diagnostic Neuroimaging

## 2019-02-22 DIAGNOSIS — R251 Tremor, unspecified: Secondary | ICD-10-CM | POA: Diagnosis not present

## 2019-02-22 MED ORDER — GADOBENATE DIMEGLUMINE 529 MG/ML IV SOLN
20.0000 mL | Freq: Once | INTRAVENOUS | Status: AC | PRN
  Administered 2019-02-22: 20 mL via INTRAVENOUS

## 2019-02-28 ENCOUNTER — Telehealth: Payer: Self-pay | Admitting: Neurology

## 2019-02-28 ENCOUNTER — Telehealth: Payer: Self-pay | Admitting: Diagnostic Neuroimaging

## 2019-02-28 NOTE — Telephone Encounter (Signed)
Pt is asking for a call once MRI results are available

## 2019-02-28 NOTE — Telephone Encounter (Signed)
I called the patient.  MRI of the brain is normal.  Her tremor may be related to secondary parkinsonism, I suppose that a DaTscan can be done in the future if needed.  The patient remains on Abilify for Tourette syndrome and for psychiatric reasons.   MRI brain 02/24/19:  IMPRESSION:   Normal MRI brain (with and without).

## 2019-02-28 NOTE — Telephone Encounter (Signed)
Result has been sent to work in MD to review due to Dr. Leta Baptist being out of the office.

## 2019-03-01 DIAGNOSIS — F411 Generalized anxiety disorder: Secondary | ICD-10-CM | POA: Diagnosis not present

## 2019-03-01 DIAGNOSIS — F321 Major depressive disorder, single episode, moderate: Secondary | ICD-10-CM | POA: Diagnosis not present

## 2019-03-03 ENCOUNTER — Telehealth: Payer: Self-pay

## 2019-03-03 DIAGNOSIS — Z01419 Encounter for gynecological examination (general) (routine) without abnormal findings: Secondary | ICD-10-CM | POA: Diagnosis not present

## 2019-03-03 NOTE — Telephone Encounter (Signed)
Patient called saying that she has been having trouble breathing including shortness of breath and trouble taking a deep breath.  It has gotten worse today and she had to use her rescue inhaler.  She has been outside a little today.  I talked with Dr.Kozlow and per Dr. Neldon Mc, patient can increase her Asmanex 200 to three puffs three times daily for the next three days and see if that helps with the symptoms.  I informed patient and told her to let us know if she does not get to feeling better.

## 2019-03-07 ENCOUNTER — Other Ambulatory Visit: Payer: Self-pay

## 2019-03-07 ENCOUNTER — Telehealth: Payer: Self-pay | Admitting: Allergy and Immunology

## 2019-03-07 MED ORDER — SPIRIVA RESPIMAT 1.25 MCG/ACT IN AERS: 2.0000 | INHALATION_SPRAY | Freq: Every day | RESPIRATORY_TRACT | 5 refills | Status: DC

## 2019-03-07 NOTE — Telephone Encounter (Signed)
Saria has requested that Jarrett Soho give her a return call.  Shunna did not state what she needed.

## 2019-03-07 NOTE — Telephone Encounter (Signed)
Talked with Penny Porter.  She increased her Asmanex to 3 puffs three times daily for three days as per Dr.Kozlow's recommendation.  She went back to original dosing on Sunday and noticed that she started having trouble taking a deep breath again.  Also has some hoarseness.  Please advise.

## 2019-03-07 NOTE — Telephone Encounter (Signed)
She can attempt to use Spiriva 1.25 respimat - 2 inhalations every day in addition to her standard dose of Asmanex. Will try and stay away from salmeterol and formoterol because of her heart issue.

## 2019-03-07 NOTE — Telephone Encounter (Signed)
Patient informed.  Will place sample at front desk for patient's sister, Izora Gala, to come pick up.  Will teach Izora Gala how to use the Spiriva.

## 2019-03-08 ENCOUNTER — Telehealth (HOSPITAL_COMMUNITY): Payer: Self-pay

## 2019-03-08 NOTE — Telephone Encounter (Addendum)
Called patient who asked, after her call from Dr Jannifer Franklin about normal MRI, what the next steps are. I advised her per Dr Gladstone Lighter note, he would consider a trial of carb/levo. She stated her left hand tremors are worse, and she would like to try the medication. I advised will let Dr Leta Baptist know. She  verbalized understanding, appreciation.

## 2019-03-08 NOTE — Telephone Encounter (Signed)
Pt returning call please call back °

## 2019-03-08 NOTE — Telephone Encounter (Signed)
Patient called and she wants you to know that her MRI came back normal, her doctor is going to start her on the new medication that she told you about. Patient wants to be sure that she can continue her other medications. Please review and advise, thank you

## 2019-03-09 MED ORDER — CARBIDOPA-LEVODOPA 25-100 MG PO TABS: 0.5000 | ORAL_TABLET | Freq: Two times a day (BID) | ORAL | 6 refills | Status: DC

## 2019-03-09 NOTE — Telephone Encounter (Signed)
Called patient and advise dher of new Rx, gave her specific instructions per Dr Leta Baptist. She repeated correctly. We scheduled her 3 month FU, and I advised she call for any problems, questions, concerns. She Patient verbalized understanding, appreciation.

## 2019-03-09 NOTE — Addendum Note (Signed)
Addended by: Andrey Spearman R on: 03/09/2019 04:02 PM   Modules accepted: Orders

## 2019-03-09 NOTE — Telephone Encounter (Signed)
Ok. Will try low dose.   Meds ordered this encounter  Medications  . carbidopa-levodopa (SINEMET IR) 25-100 MG tablet    Sig: Take 0.5-1 tablets by mouth 2 (two) times a day.    Dispense:  60 tablet    Refill:  [place], MD 9/31/1216, 2:44 PM Certified in Neurology, Neurophysiology and Neuroimaging  [facility] [address] [place], [place] [address] [phone]

## 2019-03-11 DIAGNOSIS — J310 Chronic rhinitis: Secondary | ICD-10-CM | POA: Diagnosis not present

## 2019-03-11 DIAGNOSIS — I10 Essential (primary) hypertension: Secondary | ICD-10-CM | POA: Diagnosis not present

## 2019-03-11 DIAGNOSIS — J329 Chronic sinusitis, unspecified: Secondary | ICD-10-CM | POA: Diagnosis not present

## 2019-03-11 DIAGNOSIS — J4540 Moderate persistent asthma, uncomplicated: Secondary | ICD-10-CM | POA: Diagnosis not present

## 2019-03-14 NOTE — Telephone Encounter (Signed)
Yes that is fine

## 2019-03-21 ENCOUNTER — Ambulatory Visit (INDEPENDENT_AMBULATORY_CARE_PROVIDER_SITE_OTHER): Payer: PPO | Admitting: Allergy and Immunology

## 2019-03-21 ENCOUNTER — Other Ambulatory Visit: Payer: Self-pay

## 2019-03-21 ENCOUNTER — Encounter: Payer: Self-pay | Admitting: Allergy and Immunology

## 2019-03-21 VITALS — BP 148/84 | HR 100 | Resp 16

## 2019-03-21 DIAGNOSIS — J4540 Moderate persistent asthma, uncomplicated: Secondary | ICD-10-CM | POA: Diagnosis not present

## 2019-03-21 DIAGNOSIS — J3089 Other allergic rhinitis: Secondary | ICD-10-CM | POA: Diagnosis not present

## 2019-03-21 DIAGNOSIS — K219 Gastro-esophageal reflux disease without esophagitis: Secondary | ICD-10-CM | POA: Diagnosis not present

## 2019-03-21 DIAGNOSIS — H6981 Other specified disorders of Eustachian tube, right ear: Secondary | ICD-10-CM

## 2019-03-21 MED ORDER — NYSTATIN 100000 UNIT/ML MT SUSP: OROMUCOSAL | 0 refills | Status: DC

## 2019-03-21 NOTE — Patient Instructions (Addendum)
  1.  Decrease Asmanex 200 - 2 inhalations 1 time a day with spacer  2.  Continue Spiriva 1.25 Respimat -2 inhalations 1 time per day  3. Continue montelukast 10mg  one tablet one time per day  4. Continue famotidine 20 mg 2 times per day   5. Continue OTC Nasacort one spray each 1 time per day during upper airway symptoms  6. Continue ProAir HFA or similar and antihistamine and nasal ipratropium if needed  7. Trest thrush with Nystatin oral solution- swish and swallow 5 mL's 3 times per day for 7 days  8.  Return to clinic in November 2020 or earlier if problem  9. Obtain Fall flu vaccine (and COVID vaccine)

## 2019-03-21 NOTE — Progress Notes (Signed)
[name]   Follow-up Note  Referring Provider: Raina Mina., MD Primary Provider: Raina Mina., MD Date of Office Visit: 03/21/2019  Subjective:   Penny Porter (DOB: 11-03-1965) is a 53 y.o. female who returns to the [facility] on 03/21/2019 in re-evaluation of the following:  HPI: Kamarah returns to this clinic in reevaluation of asthma and allergic rhinitis and LPR.  I last saw her in this clinic on 21 February 2019.  During her last visit we attempted to have her discontinue Symbicort given her history of palpitations and tremor while using this medication.  She has been using Asmanex and subsequently has had Spiriva added to her medical regime.  At this point in time she feels as though her lungs are doing very well and she does not need to use a short acting bronchodilator.  However, should be noted that her improvement regarding her asthma also occurs in the context of not just adding Spiriva but also using a systemic steroid at relatively low dose prescribed by her primary care doctor for an issue of sore throat and ear fullness and postnasal drip. As well, she has been using azithromycin over the course of the past 8 days.  She still has right ear fullness without any hearing loss or tinnitus and last night she might have had some lightheadedness that lasted about 30 minutes.  She does have a prescription for meclizine for previous issues with vertigo but she did not use this medication last night and today she is much better.  She does relate a history of getting intermittent hoarseness since increasing her dose of Asmanex and also using her systemic steroid and antibiotic.  Her reflux is under very good control at this point in time while using famotidine twice a day.  Regarding her tremor, she is scheduled to start carbidopa for Parkinson's disease.  She had an MRI of her brain in July in investigation of this issue.   Allergies as of 03/21/2019      Reactions   Amoxicillin Hives, Swelling   LIP SWELLING PATIENT HAS HAD A PCN REACTION WITH IMMEDIATE RASH, FACIAL/TONGUE/THROAT SWELLING, SOB, OR LIGHTHEADEDNESS WITH HYPOTENSION:  #  #  #  YES  #  #  #   HAS PT DEVELOPED SEVERE RASH INVOLVING MUCUS MEMBRANES or SKIN NECROSIS: #  #  #  YES  #  #  #  Has patient had a PCN reaction that required hospitalization: No Has patient had a PCN reaction occurring within the last 10 years: Unknown If all of the above answers are "NO", then may proceed with Cephalosporin use.   Avelox [moxifloxacin Hcl In Nacl] Other (See Comments)   Caused pain in shoulders to finger, tendonitis   Adhesive [tape] Dermatitis   Blisters skin   Bextra [valdecoxib] Hives, Swelling   Doxycycline Other (See Comments)   Doxycycline Hyclate Hives, Swelling   Gadobutrol Hives   Gadolinium Derivatives Hives   Gadoversetamide Hives   Iodinated Diagnostic Agents Hives   Lamictal [lamotrigine] Hives   Naproxen Hypertension   Oxycodone-acetaminophen Other (See Comments)   Percolone [oxycodone] Hives   Relafen [nabumetone] Hypertension   Risperdal [risperidone] Other (See Comments)   Leg weakness   Nitrofurantoin Other (See Comments)   Brintellix [vortioxetine] Nausea And Vomiting   Fetzima [levomilnacipran] Other (See Comments)   Tremors across head   Moxifloxacin Other (See Comments)   Caused pain in shoulders to finger,  tendonitis      Medication List    albuterol (2.5 MG/3ML) 0.083% nebulizer solution Commonly known as: PROVENTIL PLACE 1 VIAL IN NEBULIZER EVERY 6 HOURS AS NEEDED FOR WHEEZING UP TO 30 DAYS   Albuterol Sulfate 108 (90 Base) MCG/ACT Aepb Commonly known as: ProAir RespiClick Inhale 2 puffs into the lungs every 4 (four) hours as needed.   allopurinol 100 MG tablet Commonly known as: ZYLOPRIM Take by mouth.   ARIPiprazole 30 MG tablet Commonly known as: ABILIFY Take 1 tablet (30 mg total) by mouth daily.    aspirin 81 MG tablet Take 81 mg by mouth daily.   azithromycin 500 MG tablet Commonly known as: ZITHROMAX ONE TABLET A DAY FOR 10 DAYS   buPROPion 300 MG 24 hr tablet Commonly known as: WELLBUTRIN XL TAKE 1 TABLET (300 MG TOTAL) BY MOUTH EVERY MORNING.   carbidopa-levodopa 25-100 MG tablet Commonly known as: SINEMET IR Take 0.5-1 tablets by mouth 2 (two) times a day.   clotrimazole-betamethasone cream Commonly known as: LOTRISONE APPLY SPARINGLY TO THE AFFECTED AREA(S) TWICE DAILY AS NEEDED FOR IRRITATION   estradiol 0.1 MG/GM vaginal cream Commonly known as: ESTRACE Place 1 Applicatorful vaginally as needed.   famotidine 20 MG tablet Commonly known as: PEPCID Take 20 mg by mouth 2 (two) times daily.   hydrOXYzine 10 MG tablet Commonly known as: ATARAX/VISTARIL Take 1 tablet (10 mg total) by mouth 3 (three) times daily as needed for anxiety.   ipratropium 0.03 % nasal spray Commonly known as: ATROVENT USE 2 SPRAYS IN NOSTRIL (S) 2 TIMES DAILY AS DIRECTED   ketoconazole 2 % shampoo Commonly known as: NIZORAL Apply 1 application topically as needed for irritation.   loratadine 10 MG tablet Commonly known as: CLARITIN Take 10 mg by mouth at bedtime.   lubiprostone 24 MCG capsule Commonly known as: AMITIZA Take 24 mcg by mouth daily as needed for constipation.   meclizine 25 MG tablet Commonly known as: ANTIVERT PLEASE SEE ATTACHED FOR DETAILED DIRECTIONS   meloxicam 7.5 MG tablet Commonly known as: MOBIC Take 7.5 mg by mouth 2 (two) times daily.   metoprolol succinate 25 MG 24 hr tablet Commonly known as: TOPROL-XL Take by mouth.   mometasone 220 MCG/INH inhaler Commonly known as: ASMANEX Inhale into the lungs.   montelukast 10 MG tablet Commonly known as: SINGULAIR Take 10 mg by mouth daily.   mupirocin ointment 2 % Commonly known as: BACTROBAN Place 1 application into the nose 2 (two) times daily as needed (IRRITATION).   ondansetron 4 MG  disintegrating tablet Commonly known as: ZOFRAN-ODT Take 4 mg by mouth 2 (two) times daily as needed for nausea or vomiting.   pregabalin 75 MG capsule Commonly known as: LYRICA Take 75 mg by mouth 2 (two) times daily. Reported on 10/09/2015     Spiriva Respimat 1.25 MCG/ACT Aers Generic drug: Tiotropium Bromide Monohydrate Inhale 2 puffs into the lungs daily.   spironolactone 25 MG tablet Commonly known as: ALDACTONE Take 25 mg by mouth daily.   torsemide 20 MG tablet Commonly known as: DEMADEX Take 20 mg by mouth daily.   traMADol 50 MG tablet Commonly known as: ULTRAM Take 50 mg by mouth.   triamcinolone cream 0.1 % Commonly known as: KENALOG Apply 1 application topically 2 (two) times daily as needed (IRRITATION).   triamcinolone ointment 0.5 % Commonly known as: KENALOG Apply 1 application topically 2 (two) times daily as needed (IRRITATION).   TYLENOL PO Take 1 tablet by mouth  daily as needed (PAIN).   vitamin B-12 1000 MCG tablet Commonly known as: CYANOCOBALAMIN Take 1,000 mcg by mouth daily.   Vitamin D-3 125 MCG (5000 UT) Tabs Take 5,000 Units by mouth daily.       Past Medical History:  Diagnosis Date  . Anxiety   . Arthritis    Back and hip  . Asthma   . Bursitis   . Complication of anesthesia    bladder hard to wake up, never needed a in and out cath  . Costochondral chest pain   . Degenerative disk disease   . Depression   . Gastroparesis   . GERD (gastroesophageal reflux disease)   . Hypertension   . Lumbar herniated disc   . Sleep apnea    not using CPAP  . Stenosis of lumbosacral spine   . Stress fracture of foot   . Thyroid nodule   . Tourette disease   . Trigeminal nerve disease     Past Surgical History:  Procedure Laterality Date  . BACK SURGERY  September 19, 2015  . bone spur removal on back    . CHOLECYSTECTOMY  2005  . COLONOSCOPY    . ELBOW SURGERY Left May 29, 2014  . ESOPHAGOGASTRODUODENOSCOPY    . KNEE SURGERY   06/2016  . polyp removal    . POSTERIOR FUSION LUMBAR SPINE  06/26/2017  . ruptured disk  2008  . TEAR DUCT PROBING  2009    Review of systems negative except as noted in HPI / PMHx or noted below:  Review of Systems  Constitutional: Negative.   HENT: Negative.   Eyes: Negative.   Respiratory: Negative.   Cardiovascular: Negative.   Gastrointestinal: Negative.   Genitourinary: Negative.   Musculoskeletal: Negative.   Skin: Negative.   Neurological: Negative.   Endo/Heme/Allergies: Negative.   Psychiatric/Behavioral: Negative.      Objective:   Vitals:   03/21/19 1125  BP: (!) 148/84  Pulse: 100  Resp: 16  SpO2: 96%           Physical Exam Constitutional:      Appearance: She is not diaphoretic.  HENT:     Head: Normocephalic.     Right Ear: Ear canal and external ear normal. A middle ear effusion (Lack of pneumatic movement) is present.     Left Ear: Tympanic membrane, ear canal and external ear normal.     Nose: Nose normal. No mucosal edema or rhinorrhea.     Mouth/Throat:     Pharynx: Uvula midline. Oropharyngeal exudate (Thrush) present.  Eyes:     Conjunctiva/sclera: Conjunctivae normal.  Neck:     Thyroid: No thyromegaly.     Trachea: Trachea normal. No tracheal tenderness or tracheal deviation.  Cardiovascular:     Rate and Rhythm: Normal rate and regular rhythm.     Heart sounds: Normal heart sounds, S1 normal and S2 normal. No murmur.  Pulmonary:     Effort: No respiratory distress.     Breath sounds: Normal breath sounds. No stridor. No wheezing or rales.  Lymphadenopathy:     Head:     Right side of head: No tonsillar adenopathy.     Left side of head: No tonsillar adenopathy.     Cervical: No cervical adenopathy.  Skin:    Findings: No erythema or rash.     Nails: There is no clubbing.   Neurological:     Mental Status: She is alert.     Diagnostics:  Spirometry was performed and demonstrated an FEV1 of 1.80 at 54 % of predicted.   She had a less than optimal effort on the spirometric maneuver.  Results of an MRI of head obtained 22 February 2019 identifies the following:  The orbits and their contents, paranasal sinuses and calvarium are unremarkable.  Assessment and Plan:   1. Asthma, moderate persistent, well-controlled   2. Other allergic rhinitis   3. LPRD (laryngopharyngeal reflux disease)   4. Dysfunction of right eustachian tube     1.  Decrease Asmanex 200 - 2 inhalations 1 time a day with spacer  2.  Continue Spiriva 1.25 Respimat -2 inhalations 1 time per day  3. Continue montelukast 10mg  one tablet one time per day  4. Continue famotidine 20 mg 2 times per day   5. Continue OTC Nasacort one spray each 1 time per day during upper airway symptoms  6. Continue ProAir HFA or similar and antihistamine and nasal ipratropium if needed  7. Trest thrush with Nystatin oral solution- swish and swallow 5 mL's 3 times per day for 7 days  8.  Return to clinic in November 2020 or earlier if problem  9. Obtain Fall flu vaccine (and COVID vaccine)   Janaiya appears to be doing relatively well with her asthma at this point.  However, her improvement comes in the context of using a systemic steroid and she has developed a problem while utilizing this combination with thrush and intermittent hoarseness.  We will now see if we can decrease her Asmanex to 1 time per day in conjunction with Spiriva and a leukotriene modifier and a nasal steroid to maintain good control of her airway issue.  We removed her Symbicort because there was an issue with palpitations and tremor.  Removing Symbicort has not really affected these 2 issues so if we need to go back to using Symbicort we can do so.  She appears to have a ETD on the right and I told her that this may last 6 to 12 weeks as a maximum time interval.  Her reflux appears to be under very good control.  She will use therapy for reflux as noted above.  I will see her back in this  clinic in November 2020 or earlier if there is a problem.  Allena Katz, MD Allergy / Immunology [place]

## 2019-03-22 ENCOUNTER — Encounter: Payer: Self-pay | Admitting: Allergy and Immunology

## 2019-03-25 ENCOUNTER — Telehealth: Payer: Self-pay | Admitting: *Deleted

## 2019-03-25 NOTE — Telephone Encounter (Signed)
Unfortunately not much else can be done.  She can try using the Nasacort twice a day for several days and see if that helps.  Is she performing nasal saline rinses?   She may need to see ENT for any further management options.

## 2019-03-25 NOTE — Telephone Encounter (Signed)
Penny Porter saw Dr. Neldon Mc on 03/21/2019 and he diagnosed her with ETD (right ear) and told her that it should resolve within the next 6-12 weeks.  She calls today stating that her ear feels worse in the last day or so. She said that it feels "numb" and like there is pressure. She wants to know what else she can do besides the medications she is already taking. She is taking Claritin BID and using her Nasacort daily. She also took Prednisone recently prescribed by her PCP. [facility] so [facility] in [place]) I told her that Dr. Neldon Mc would be back in the office on Monday but she preferred to have a physician respond today. Please advise if there is anything further we can do for her. Thanks!

## 2019-03-25 NOTE — Telephone Encounter (Signed)
Patient informed of instructions, she will try increasing her Nasacort and she hasn't been doing rinses but I suggested she try them.

## 2019-03-28 ENCOUNTER — Other Ambulatory Visit: Payer: Self-pay

## 2019-03-28 ENCOUNTER — Telehealth: Payer: Self-pay | Admitting: *Deleted

## 2019-03-28 MED ORDER — ASMANEX HFA 200 MCG/ACT IN AERO: 2.0000 | INHALATION_SPRAY | Freq: Every day | RESPIRATORY_TRACT | 5 refills | Status: DC

## 2019-03-28 NOTE — Telephone Encounter (Signed)
Please have patient arrange to see ENT regarding her ears and head issue.  Continue all previously prescribed medications.

## 2019-03-28 NOTE — Telephone Encounter (Addendum)
Talked with Penny Porter.  She has been having a headache for the past week, on and off, located in the frontal part of her head.  She has been using Tylenol which does help.  Her ears are still bothering her. Please advise.

## 2019-03-28 NOTE — Telephone Encounter (Signed)
Talked with patient and sent message to Dr.Kozlow.  Attached message to previous telephone contact from 03/25/2019

## 2019-03-28 NOTE — Telephone Encounter (Signed)
Penny Porter is requesting a call back from [place].

## 2019-03-28 NOTE — Telephone Encounter (Signed)
Patient informed.  Will put in order for referral to [facility]. Please refer for Eustachian Tube Dysfunction.

## 2019-03-29 DIAGNOSIS — F321 Major depressive disorder, single episode, moderate: Secondary | ICD-10-CM | POA: Diagnosis not present

## 2019-03-29 DIAGNOSIS — F411 Generalized anxiety disorder: Secondary | ICD-10-CM | POA: Diagnosis not present

## 2019-03-30 ENCOUNTER — Telehealth: Payer: Self-pay | Admitting: Allergy and Immunology

## 2019-03-30 NOTE — Telephone Encounter (Signed)
Faxed referral and notes to Dr. Gaylyn Cheers at [facility].  Requested that they call [name] to schedule, patient has been informed Penny Porter will contact her to schedule.

## 2019-03-31 ENCOUNTER — Telehealth: Payer: Self-pay | Admitting: Allergy and Immunology

## 2019-03-31 NOTE — Telephone Encounter (Signed)
Penny Porter has an appointment with Jolene Provost on Friday morning 04/01/2019 at 8:40 am at [facility].  Patient has been informed and referral and notes have been faxed.

## 2019-04-01 DIAGNOSIS — Z8739 Personal history of other diseases of the musculoskeletal system and connective tissue: Secondary | ICD-10-CM | POA: Diagnosis not present

## 2019-04-01 DIAGNOSIS — R51 Headache: Secondary | ICD-10-CM | POA: Diagnosis not present

## 2019-04-01 DIAGNOSIS — H938X3 Other specified disorders of ear, bilateral: Secondary | ICD-10-CM | POA: Diagnosis not present

## 2019-04-06 DIAGNOSIS — R51 Headache: Secondary | ICD-10-CM | POA: Diagnosis not present

## 2019-04-07 ENCOUNTER — Encounter (HOSPITAL_COMMUNITY): Payer: Self-pay | Admitting: Psychiatry

## 2019-04-07 ENCOUNTER — Other Ambulatory Visit: Payer: Self-pay

## 2019-04-07 ENCOUNTER — Ambulatory Visit (INDEPENDENT_AMBULATORY_CARE_PROVIDER_SITE_OTHER): Payer: PPO | Admitting: Psychiatry

## 2019-04-07 DIAGNOSIS — F411 Generalized anxiety disorder: Secondary | ICD-10-CM | POA: Diagnosis not present

## 2019-04-07 DIAGNOSIS — F332 Major depressive disorder, recurrent severe without psychotic features: Secondary | ICD-10-CM

## 2019-04-07 DIAGNOSIS — F952 Tourette's disorder: Secondary | ICD-10-CM | POA: Diagnosis not present

## 2019-04-07 MED ORDER — BUPROPION HCL ER (XL) 300 MG PO TB24: ORAL_TABLET | ORAL | 0 refills | Status: DC

## 2019-04-07 MED ORDER — ARIPIPRAZOLE 30 MG PO TABS: 30.0000 mg | ORAL_TABLET | Freq: Every day | ORAL | 0 refills | Status: DC

## 2019-04-07 MED ORDER — HYDROXYZINE HCL 10 MG PO TABS: 10.0000 mg | ORAL_TABLET | Freq: Three times a day (TID) | ORAL | 0 refills | Status: DC | PRN

## 2019-04-07 NOTE — Progress Notes (Signed)
Virtual Visit via Telephone Note  I connected with Penny Porter on 04/07/19 at 10:00 AM EDT by telephone and verified that I am speaking with the correct person using two identifiers.  Location: Patient: home Provider: office   I discussed the limitations, risks, security and privacy concerns of performing an evaluation and management service by telephone and the availability of in person appointments. I also discussed with the patient that there may be a patient responsible charge related to this service. The patient expressed understanding and agreed to proceed.   History of Present Illness: "Life has been ok". She has been having a number of illness since late June. She had a CT yesterday and is waiting on the results. She may have Parkinson's and is waiting for the neurologist to get back to her. It could be due to Abilify.  Her anxiety is "fair". Some days are worse than others. It is mostly related to her health and environmental stressors. Her depression is stable. Pt took Prednisone in early August and for a few days she was feeling more sad and tearful. Now it is better. She has issues with pain and can't do much physically. It makes her sad. She is always tired and unmotivated. Sleep is variable. Some nights she is able to sleep thru the night and other nights she wakes up in the middle of night and can't go back to sleep. Penny Porter denies SI/HI. When stressed she more tourette's symptoms otherwise it is well controlled. Pt states meds are working well. She is worried if she will have to stop Abilify and how that will affect her mood. She is also concerned about SE from [place].    Observations/Objective: I spoke with Penny Porter on the phone.  Pt was calm, pleasant and cooperative.  Pt was engaged in the conversation and answered questions appropriately.  Speech was clear and coherent with normal rate, tone and volume.  Mood is depressed and anxious, affect is congruent. Thought  processes are coherent, goal oriented and intact.  Thought content is logical.  Pt denies SI/HI.   Pt denies auditory and visual hallucinations and did not appear to be responding to internal stimuli.  Memory and concentration are good.  Fund of knowledge and use of language are average.  Insight and judgment are fair.  I am unable to comment on psychomotor activity, general appearance, hygiene, or eye contact as I was unable to physically see the patient on the phone.  Vital signs not available since interview conducted virtually.    Assessment and Plan: MDD-recurrent, moderate; Tourette's disorder; GAD   Anxiety seems to appropriate to the current national social and environmental stressors  Wellbutrin XL 300mg  po qD- higher doses caused HTN  Vistaril 10mg  po TID prn anxiety  Abilify 30mg  po qD- neurologist is concerned that patient may have Parkinson's like symptoms due to Abilify use. I will wait for recommendations from before making any med changes     Labs- done at PCP in April 2020. Will request (PCP- Dr. Cheryll Cockayne at [facility] in [place])   Follow Up Instructions: In 2-3 months or sooner if needed   I discussed the assessment and treatment plan with the patient. The patient was provided an opportunity to ask questions and all were answered. The patient agreed with the plan and demonstrated an understanding of the instructions.   The patient was advised to call back or seek an in-person evaluation if the symptoms worsen or if the condition fails to improve  as anticipated.  I provided  20 minutes of non-face-to-face time during this encounter.   Charlcie Cradle, MD

## 2019-04-08 ENCOUNTER — Other Ambulatory Visit: Payer: Self-pay | Admitting: Allergy and Immunology

## 2019-04-11 ENCOUNTER — Other Ambulatory Visit: Payer: Self-pay

## 2019-04-11 ENCOUNTER — Telehealth: Payer: Self-pay | Admitting: Allergy and Immunology

## 2019-04-11 MED ORDER — ALBUTEROL SULFATE (2.5 MG/3ML) 0.083% IN NEBU: INHALATION_SOLUTION | RESPIRATORY_TRACT | 1 refills | Status: DC

## 2019-04-11 NOTE — Telephone Encounter (Signed)
Please inform patient that she can have nebulizer and albuterol and she can increase her Asmanex to twice a day.

## 2019-04-11 NOTE — Telephone Encounter (Signed)
Aamiya called and asked to speak to [facility].  Jarrett Soho had stepped into a room. I offered to let her speak to another medical assistant and she refused and stated she would like to just speak to [facility].

## 2019-04-11 NOTE — Telephone Encounter (Signed)
Patient informed.  Nebulizer/compressor placed up at front desk with paperwork for patient to come by and pick up.  [place] for Albuterol sulfate sent to [facility] on [address].

## 2019-04-11 NOTE — Telephone Encounter (Signed)
Talked with patient.  Yesterday she noticed that it was hard to get a deep breathe.  She is currently using her Asmanex 200- two puffs once daily and wanted to know if she should increase it to help with trouble breathing.   Also she wanted to know if we could prescribe her a new nebulizer and Albuterol. Please advise.

## 2019-04-12 DIAGNOSIS — J4520 Mild intermittent asthma, uncomplicated: Secondary | ICD-10-CM | POA: Diagnosis not present

## 2019-04-25 DIAGNOSIS — E042 Nontoxic multinodular goiter: Secondary | ICD-10-CM | POA: Diagnosis not present

## 2019-04-25 DIAGNOSIS — I129 Hypertensive chronic kidney disease with stage 1 through stage 4 chronic kidney disease, or unspecified chronic kidney disease: Secondary | ICD-10-CM | POA: Diagnosis not present

## 2019-04-25 DIAGNOSIS — E782 Mixed hyperlipidemia: Secondary | ICD-10-CM | POA: Diagnosis not present

## 2019-04-25 DIAGNOSIS — J4540 Moderate persistent asthma, uncomplicated: Secondary | ICD-10-CM | POA: Diagnosis not present

## 2019-04-25 DIAGNOSIS — F952 Tourette's disorder: Secondary | ICD-10-CM | POA: Diagnosis not present

## 2019-04-25 DIAGNOSIS — I878 Other specified disorders of veins: Secondary | ICD-10-CM | POA: Diagnosis not present

## 2019-04-25 DIAGNOSIS — M5136 Other intervertebral disc degeneration, lumbar region: Secondary | ICD-10-CM | POA: Diagnosis not present

## 2019-04-25 DIAGNOSIS — R5383 Other fatigue: Secondary | ICD-10-CM | POA: Diagnosis not present

## 2019-04-25 DIAGNOSIS — J310 Chronic rhinitis: Secondary | ICD-10-CM | POA: Diagnosis not present

## 2019-04-25 DIAGNOSIS — R5381 Other malaise: Secondary | ICD-10-CM | POA: Diagnosis not present

## 2019-04-25 DIAGNOSIS — K219 Gastro-esophageal reflux disease without esophagitis: Secondary | ICD-10-CM | POA: Diagnosis not present

## 2019-04-25 DIAGNOSIS — G500 Trigeminal neuralgia: Secondary | ICD-10-CM | POA: Diagnosis not present

## 2019-04-25 DIAGNOSIS — Z23 Encounter for immunization: Secondary | ICD-10-CM | POA: Diagnosis not present

## 2019-04-25 DIAGNOSIS — G4733 Obstructive sleep apnea (adult) (pediatric): Secondary | ICD-10-CM | POA: Diagnosis not present

## 2019-04-25 DIAGNOSIS — K5909 Other constipation: Secondary | ICD-10-CM | POA: Diagnosis not present

## 2019-04-25 DIAGNOSIS — M5030 Other cervical disc degeneration, unspecified cervical region: Secondary | ICD-10-CM | POA: Diagnosis not present

## 2019-04-26 DIAGNOSIS — F411 Generalized anxiety disorder: Secondary | ICD-10-CM | POA: Diagnosis not present

## 2019-04-26 DIAGNOSIS — F321 Major depressive disorder, single episode, moderate: Secondary | ICD-10-CM | POA: Diagnosis not present

## 2019-04-27 DIAGNOSIS — H0102A Squamous blepharitis right eye, upper and lower eyelids: Secondary | ICD-10-CM | POA: Diagnosis not present

## 2019-04-27 DIAGNOSIS — H25813 Combined forms of age-related cataract, bilateral: Secondary | ICD-10-CM | POA: Diagnosis not present

## 2019-04-27 DIAGNOSIS — H0102B Squamous blepharitis left eye, upper and lower eyelids: Secondary | ICD-10-CM | POA: Diagnosis not present

## 2019-05-02 ENCOUNTER — Telehealth: Payer: Self-pay | Admitting: *Deleted

## 2019-05-02 NOTE — Telephone Encounter (Signed)
I printed and faxed notes.

## 2019-05-02 NOTE — Telephone Encounter (Signed)
[employer] called and is requesting records from her last OV with Dr.Kozlow so that insurance will cover her nebulizer.

## 2019-05-04 ENCOUNTER — Other Ambulatory Visit: Payer: Self-pay | Admitting: *Deleted

## 2019-05-12 DIAGNOSIS — J4520 Mild intermittent asthma, uncomplicated: Secondary | ICD-10-CM | POA: Diagnosis not present

## 2019-05-13 DIAGNOSIS — G4733 Obstructive sleep apnea (adult) (pediatric): Secondary | ICD-10-CM | POA: Diagnosis not present

## 2019-05-13 DIAGNOSIS — R000 Tachycardia, unspecified: Secondary | ICD-10-CM | POA: Diagnosis not present

## 2019-05-13 DIAGNOSIS — I498 Other specified cardiac arrhythmias: Secondary | ICD-10-CM | POA: Diagnosis not present

## 2019-05-13 DIAGNOSIS — I10 Essential (primary) hypertension: Secondary | ICD-10-CM | POA: Diagnosis not present

## 2019-05-13 DIAGNOSIS — J4540 Moderate persistent asthma, uncomplicated: Secondary | ICD-10-CM | POA: Diagnosis not present

## 2019-05-18 DIAGNOSIS — F411 Generalized anxiety disorder: Secondary | ICD-10-CM | POA: Diagnosis not present

## 2019-05-18 DIAGNOSIS — F321 Major depressive disorder, single episode, moderate: Secondary | ICD-10-CM | POA: Diagnosis not present

## 2019-05-19 DIAGNOSIS — R000 Tachycardia, unspecified: Secondary | ICD-10-CM | POA: Diagnosis not present

## 2019-05-20 DIAGNOSIS — I10 Essential (primary) hypertension: Secondary | ICD-10-CM | POA: Diagnosis not present

## 2019-05-20 DIAGNOSIS — R000 Tachycardia, unspecified: Secondary | ICD-10-CM | POA: Diagnosis not present

## 2019-05-20 DIAGNOSIS — N1831 Chronic kidney disease, stage 3a: Secondary | ICD-10-CM | POA: Diagnosis not present

## 2019-05-20 DIAGNOSIS — L03031 Cellulitis of right toe: Secondary | ICD-10-CM | POA: Diagnosis not present

## 2019-05-31 ENCOUNTER — Ambulatory Visit: Payer: PPO | Admitting: Diagnostic Neuroimaging

## 2019-05-31 ENCOUNTER — Other Ambulatory Visit: Payer: Self-pay

## 2019-05-31 ENCOUNTER — Encounter: Payer: Self-pay | Admitting: Diagnostic Neuroimaging

## 2019-05-31 VITALS — BP 136/91 | HR 73 | Temp 97.1°F | Wt 271.6 lb

## 2019-05-31 DIAGNOSIS — R251 Tremor, unspecified: Secondary | ICD-10-CM

## 2019-05-31 DIAGNOSIS — G20 Parkinson's disease: Secondary | ICD-10-CM | POA: Diagnosis not present

## 2019-05-31 DIAGNOSIS — R269 Unspecified abnormalities of gait and mobility: Secondary | ICD-10-CM

## 2019-05-31 NOTE — Progress Notes (Signed)
[facility]  PATIENT: Penny Porter DOB: 07/25/1966  REFERRING CLINICIAN: M Brown-Patram HISTORY FROM: patient  REASON FOR VISIT: follow up   HISTORICAL  CHIEF COMPLAINT:  Chief Complaint  Patient presents with  . Follow-up    Tremor follow up room 6 with [name] and her temp is 97.9    HISTORY OF PRESENT ILLNESS:   UPDATE (05/31/19, VRP): Since last visit, doing about the same. Postural tremor in left hand continues. Carb/levo 1 tab twice a day --> not much benefit. Symptoms are stable. Severity is moderate. No alleviating or aggravating factors.    PRIOR HPI (01/25/19): 53 year old female here for evaluation of tremors.  Patient reports approximately 2 months of intermittent postural tremors in her left hand when she holds objects.  She has also noticed that her fingers and hands tend to "lock up" at random times.  Patient has significant psychiatry history cutting anxiety, depression, Tourette's syndrome.  Patient has been on risperidone and Abilify.  She had developed some tremors in her legs and was prescribed Cogentin in the past but stopped this more recently.  Patient has had some dizziness and vertigo but these have resolved.   REVIEW OF SYSTEMS: Full 14 system review of systems performed and negative with exception of: as per HPI.  ALLERGIES: Allergies  Allergen Reactions  . Amoxicillin Hives and Swelling    LIP SWELLING PATIENT HAS HAD A PCN REACTION WITH IMMEDIATE RASH, FACIAL/TONGUE/THROAT SWELLING, SOB, OR LIGHTHEADEDNESS WITH HYPOTENSION:  #  #  #  YES  #  #  #   HAS PT DEVELOPED SEVERE RASH INVOLVING MUCUS MEMBRANES or SKIN NECROSIS: #  #  #  YES  #  #  #  Has patient had a PCN reaction that required hospitalization: No Has patient had a PCN reaction occurring within the last 10 years: Unknown If all of the above answers are "NO", then may proceed with Cephalosporin use.   . Avelox [Moxifloxacin Hcl In Nacl] Other (See Comments)    Caused  pain in shoulders to finger, tendonitis  . Adhesive [Tape] Dermatitis    Blisters skin  . Bextra [Valdecoxib] Hives and Swelling  . Doxycycline Other (See Comments)  . Doxycycline Hyclate Hives and Swelling  . Gadobutrol Hives  . Gadolinium Derivatives Hives  . Gadoversetamide Hives  . Iodinated Diagnostic Agents Hives  . Lamictal [Lamotrigine] Hives  . Naproxen Hypertension  . Oxycodone-Acetaminophen Other (See Comments)  . Percolone [Oxycodone] Hives  . Relafen [Nabumetone] Hypertension  . Risperdal [Risperidone] Other (See Comments)    Leg weakness   . Nitrofurantoin Other (See Comments)  . Brintellix [Vortioxetine] Nausea And Vomiting  . Fetzima [Levomilnacipran] Other (See Comments)    Tremors across head   . Moxifloxacin Other (See Comments)    Caused pain in shoulders to finger, tendonitis    HOME MEDICATIONS: Outpatient Medications Prior to Visit  Medication Sig Dispense Refill  . Acetaminophen (TYLENOL PO) Take 1 tablet by mouth daily as needed (PAIN).    Marland Kitchen albuterol (PROVENTIL) (2.5 MG/3ML) 0.083% nebulizer solution Can use one vial in nebulizer every four to six hours as needed for cough or wheeze. 180 mL 1  . allopurinol (ZYLOPRIM) 100 MG tablet Take by mouth.    . ARIPiprazole (ABILIFY) 30 MG tablet Take 1 tablet (30 mg total) by mouth daily. 90 tablet 0  . aspirin 81 MG tablet Take 81 mg by mouth daily.    Marland Kitchen azithromycin (ZITHROMAX) 250 MG tablet Takes three times a  week    . buPROPion (WELLBUTRIN XL) 300 MG 24 hr tablet TAKE 1 TABLET (300 MG TOTAL) BY MOUTH EVERY MORNING. 90 tablet 0  . carbidopa-levodopa (SINEMET IR) 25-100 MG tablet Take 0.5-1 tablets by mouth 2 (two) times a day. 60 tablet 6  . Cholecalciferol (VITAMIN D-3) 5000 UNITS TABS Take 5,000 Units by mouth daily.     . clotrimazole-betamethasone (LOTRISONE) cream APPLY SPARINGLY TO THE AFFECTED AREA(S) TWICE DAILY AS NEEDED FOR IRRITATION  5  . estradiol (ESTRACE) 0.1 MG/GM vaginal cream Place 1  Applicatorful vaginally as needed.     . famotidine (PEPCID) 20 MG tablet Take 20 mg by mouth 2 (two) times daily.    . hydrOXYzine (ATARAX/VISTARIL) 10 MG tablet Take 1 tablet (10 mg total) by mouth 3 (three) times daily as needed for anxiety. 270 tablet 0  . ipratropium (ATROVENT) 0.03 % nasal spray USE 2 SPRAYS IN NOSTRIL (S) 2 TIMES DAILY AS DIRECTED  11  . ketoconazole (NIZORAL) 2 % shampoo Apply 1 application topically as needed for irritation.     Marland Kitchen loratadine (CLARITIN) 10 MG tablet Take 10 mg by mouth at bedtime.     Marland Kitchen lubiprostone (AMITIZA) 24 MCG capsule Take 24 mcg by mouth daily as needed for constipation.     . meclizine (ANTIVERT) 25 MG tablet PLEASE SEE ATTACHED FOR DETAILED DIRECTIONS    . meloxicam (MOBIC) 7.5 MG tablet Take 7.5 mg by mouth 2 (two) times daily.  0  . metoprolol succinate (TOPROL-XL) 25 MG 24 hr tablet Take by mouth.    . Mometasone Furoate (ASMANEX HFA) 200 MCG/ACT AERO Inhale 2 puffs into the lungs daily. Rinse, gargle, and spit after use. 13 g 5  . montelukast (SINGULAIR) 10 MG tablet Take 10 mg by mouth daily.  5  . mupirocin ointment (BACTROBAN) 2 % Place 1 application into the nose 2 (two) times daily as needed (IRRITATION).     Marland Kitchen nystatin (MYCOSTATIN) 100000 UNIT/ML suspension Swish and swallow 28mL's by mouth 3 times daily for 7 days 110 mL 0  . ondansetron (ZOFRAN-ODT) 4 MG disintegrating tablet Take 4 mg by mouth 2 (two) times daily as needed for nausea or vomiting.    . pregabalin (LYRICA) 75 MG capsule Take 75 mg by mouth 2 (two) times daily. Reported on 10/09/2015    . PROAIR RESPICLICK 123XX123 (90 Base) MCG/ACT AEPB TAKE 2 PUFFS BY MOUTH EVERY 4 HOURS AS NEEDED 1 each 1  . spironolactone (ALDACTONE) 25 MG tablet Take 25 mg by mouth daily.    . Tiotropium Bromide Monohydrate (SPIRIVA RESPIMAT) 1.25 MCG/ACT AERS Inhale 2 puffs into the lungs daily. 4 g 5  . torsemide (DEMADEX) 20 MG tablet Take 20 mg by mouth daily.    . traMADol (ULTRAM) 50 MG tablet Take  50 mg by mouth.    . triamcinolone cream (KENALOG) 0.1 % Apply 1 application topically 2 (two) times daily as needed (IRRITATION).     Marland Kitchen triamcinolone ointment (KENALOG) 0.5 % Apply 1 application topically 2 (two) times daily as needed (IRRITATION).     Marland Kitchen vitamin B-12 (CYANOCOBALAMIN) 1000 MCG tablet Take 1,000 mcg by mouth daily.    Marland Kitchen azithromycin (ZITHROMAX) 500 MG tablet ONE TABLET A DAY FOR 10 DAYS     No facility-administered medications prior to visit.     PAST MEDICAL HISTORY: Past Medical History:  Diagnosis Date  . Anxiety   . Arthritis    Back and hip  . Asthma   .  Bursitis   . Complication of anesthesia    bladder hard to wake up, never needed a in and out cath  . Costochondral chest pain   . Degenerative disk disease   . Depression   . Gastroparesis   . GERD (gastroesophageal reflux disease)   . Hypertension   . Lumbar herniated disc   . Sleep apnea    not using CPAP  . Stenosis of lumbosacral spine   . Stress fracture of foot   . Thyroid nodule   . Tourette disease   . Trigeminal nerve disease     PAST SURGICAL HISTORY: Past Surgical History:  Procedure Laterality Date  . BACK SURGERY  September 19, 2015  . bone spur removal on back    . CHOLECYSTECTOMY  2005  . COLONOSCOPY    . ELBOW SURGERY Left May 29, 2014  . ESOPHAGOGASTRODUODENOSCOPY    . KNEE SURGERY  06/2016  . polyp removal    . POSTERIOR FUSION LUMBAR SPINE  06/26/2017  . ruptured disk  2008  . TEAR DUCT PROBING  2009    FAMILY HISTORY: Family History  Problem Relation Age of Onset  . Mental retardation Sister   . Dementia Father   . Cancer Mother   . Suicidality Neg Hx     SOCIAL HISTORY: Social History   Socioeconomic History  . Marital status: Single    Spouse name: Not on file  . Number of children: Not on file  . Years of education: Not on file  . Highest education level: High school graduate  Occupational History    Comment: disabled   Social Needs  . Financial  resource strain: Not on file  . Food insecurity    Worry: Not on file    Inability: Not on file  . Transportation needs    Medical: Not on file    Non-medical: Not on file  Tobacco Use  . Smoking status: Never Smoker  . Smokeless tobacco: Never Used  Substance and Sexual Activity  . Alcohol use: No  . Drug use: No  . Sexual activity: Not Currently  Lifestyle  . Physical activity    Days per week: Not on file    Minutes per session: Not on file  . Stress: Not on file  Relationships  . Social Herbalist on phone: Not on file    Gets together: Not on file    Attends religious service: Not on file    Active member of club or organization: Not on file    Attends meetings of clubs or organizations: Not on file    Relationship status: Not on file  . Intimate partner violence    Fear of current or ex partner: Not on file    Emotionally abused: Not on file    Physically abused: Not on file    Forced sexual activity: Not on file  Other Topics Concern  . Not on file  Social History Narrative   Lives with 2 sisters   Caffeine- none     PHYSICAL EXAM  GENERAL EXAM/CONSTITUTIONAL: Vitals:  Vitals:   05/31/19 0832  BP: (!) 136/91  Pulse: 73  Temp: (!) 97.1 F (36.2 C)  Weight: 271 lb 9.6 oz (123.2 kg)   Body mass index is 39.53 kg/m. Wt Readings from Last 3 Encounters:  05/31/19 271 lb 9.6 oz (123.2 kg)  01/25/19 272 lb (123.4 kg)  08/27/17 265 lb (120.2 kg)    Patient is in no distress;  well developed, nourished and groomed; neck is supple  CARDIOVASCULAR:  Examination of carotid arteries is normal; no carotid bruits  Regular rate and rhythm, no murmurs  Examination of peripheral vascular system by observation and palpation is normal  EYES:  Ophthalmoscopic exam of optic discs and posterior segments is normal; no papilledema or hemorrhages No exam data present  MUSCULOSKELETAL:  Gait, strength, tone, movements noted in Neurologic exam below   NEUROLOGIC: MENTAL STATUS:  No flowsheet data found.  awake, alert, oriented to person, place and time  recent and remote memory intact  normal attention and concentration  language fluent, comprehension intact, naming intact  fund of knowledge appropriate  CRANIAL NERVE:   2nd - no papilledema on fundoscopic exam  2nd, 3rd, 4th, 6th - pupils equal and reactive to light, visual fields full to confrontation, extraocular muscles intact, no nystagmus  5th - facial sensation symmetric  7th - facial strength symmetric  8th - hearing intact  9th - palate elevates symmetrically, uvula midline  11th - shoulder shrug symmetric  12th - tongue protrusion midline  MOTOR:   NO TREMOR  MILD RIGIDITY IN LUE; MILD BRADYKINESIA IN LUE   full strength in the BUE, BLE  SENSORY:   normal and symmetric to light touch; DECR VIB AT TOES  COORDINATION:   finger-nose-finger, fine finger movements --> SLOW  REFLEXES:   deep tendon reflexes TRACE and symmetric  GAIT/STATION:   USES CANE; DECR ARM SWING; SHORT STEPS    DIAGNOSTIC DATA (LABS, IMAGING, TESTING) - I reviewed patient records, labs, notes, testing and imaging myself where available.  Lab Results  Component Value Date   WBC 9.0 06/18/2017   HGB 14.6 06/18/2017   HCT 43.2 06/18/2017   MCV 94.3 06/18/2017   PLT 212 06/18/2017      Component Value Date/Time   NA 138 06/18/2017 1051   K 3.9 06/18/2017 1051   CL 106 06/18/2017 1051   CO2 27 06/18/2017 1051   GLUCOSE 76 06/18/2017 1051   BUN 14 06/18/2017 1051   CREATININE 0.97 06/18/2017 1051   CALCIUM 9.0 06/18/2017 1051   GFRNONAA >60 06/18/2017 1051   GFRAA >60 06/18/2017 1051   No results found for: CHOL, HDL, LDLCALC, LDLDIRECT, TRIG, CHOLHDL No results found for: HGBA1C No results found for: VITAMINB12 No results found for: TSH   02/22/19 MRI brain - normal    ASSESSMENT AND PLAN  53 y.o. year old female here with mild postural tremor in  left upper extremity, mild rigidity and bradykinesia.  Could represent essential tremor versus parkinsonism (drug-induced versus idiopathic).  Dx:  1. Tremor   2. Parkinsonism, unspecified Parkinsonism type ([place])      PLAN:  PARKINSONISM (idiopathic vs drug induced: abilify) - taper off carb/levo for a few months (not that effective) - PT evaluation (gait training) - may consider DATscan in future  Orders Placed This Encounter  Procedures  . Ambulatory referral to Physical Therapy   Return for pending if symptoms worsen or fail to improve.    Penni Bombard, MD [id number], [id number] AM Certified in Neurology, Neurophysiology and Neuroimaging  [facility] [address] [place], [place] [address] [phone]

## 2019-06-01 NOTE — Telephone Encounter (Signed)
Fax from [facility].  Nebulizer has been approved 05/03/19 until 05/31/20.

## 2019-06-02 ENCOUNTER — Ambulatory Visit (INDEPENDENT_AMBULATORY_CARE_PROVIDER_SITE_OTHER): Payer: PPO | Admitting: Psychiatry

## 2019-06-02 ENCOUNTER — Encounter (HOSPITAL_COMMUNITY): Payer: Self-pay | Admitting: Psychiatry

## 2019-06-02 ENCOUNTER — Other Ambulatory Visit: Payer: Self-pay

## 2019-06-02 DIAGNOSIS — F411 Generalized anxiety disorder: Secondary | ICD-10-CM | POA: Diagnosis not present

## 2019-06-02 DIAGNOSIS — F332 Major depressive disorder, recurrent severe without psychotic features: Secondary | ICD-10-CM

## 2019-06-02 DIAGNOSIS — F952 Tourette's disorder: Secondary | ICD-10-CM

## 2019-06-02 MED ORDER — HYDROXYZINE HCL 10 MG PO TABS: 10.0000 mg | ORAL_TABLET | Freq: Three times a day (TID) | ORAL | 0 refills | Status: DC | PRN

## 2019-06-02 MED ORDER — BUPROPION HCL ER (XL) 300 MG PO TB24: ORAL_TABLET | ORAL | 0 refills | Status: DC

## 2019-06-02 MED ORDER — ARIPIPRAZOLE 30 MG PO TABS: 30.0000 mg | ORAL_TABLET | Freq: Every day | ORAL | 0 refills | Status: DC

## 2019-06-02 NOTE — Progress Notes (Signed)
Virtual Visit via Video Note  I connected with Penny Porter on 06/02/19 at 10:00 AM EDT by phone. We attempted to connect by video enabled telemedicine application many times but were unsuccessful. We opted to continue by phone.  I verified that I am speaking with the correct person using two identifiers.  Location: Patient: home Provider: office   I discussed the limitations of evaluation and management by telemedicine and the availability of in person appointments. The patient expressed understanding and agreed to proceed.  History of Present Illness: Pt states saw her neurologist earlier this week. They are going to stop the Carbidopa/Levadopa to see if it is helping with parkinson like movements.  When [name] picks something up her left hand shakes uncontrollably. Her fingers often move on their own. She has some stiffness in her left arm. Her neurologist told her she can either change her medication or learn to live with the movements.   Pt does not want to stop Abilify. Kynzlei feels that Abilify has been effective and is scared to stop it. Her depression and anxiety are well controlled. She has some days where she feels down but she is still active and does not isolate. It has been hard because of the pandemic. "it's like prison somehow" when asked about activities. Pt denies SI/HI. She is taking her Vistaril TID and is helps. Her tics are well controlled.    Observations/Objective:  There were no vitals taken for this visit.There is no height or weight on file to calculate BMI.  General Appearance: unable to assess  Eye Contact:  unable to assess  Speech:  Clear and Coherent, Normal Rate and monotone  Volume:  Normal  Mood:  Anxious and Depressed  Affect:  Congruent  Thought Process:  Goal Directed, Linear and Descriptions of Associations: Intact  Orientation:  Full (Time, Place, and Person)  Thought Content:  Logical  Suicidal Thoughts:  No  Homicidal Thoughts:  No  Memory:  Immediate;    Good  Judgement:  Good  Insight:  Good  Psychomotor Activity:  unable to assess  Concentration:  Concentration: Good  Recall:  Good  Fund of Knowledge:  Good  Language:  Good  Akathisia:  unable to assess  Handed:  Right  AIMS (if indicated):     Assets:  Communication Skills Desire for Improvement Financial Resources/Insurance Housing Resilience Social Support Transportation Vocational/Educational  ADL's:  Unable to assess  Cognition:  WNL  Sleep:        I reviewed the information below on 06/02/2019 and have updated it Assessment and Plan: MDD-recurrent, moderate; Tourette's disorder; GAD  Anxiety levels seems to be appropriate to ongoing [employer] and environmental stressors  Wellbutrin XL 300mg  po qD- higher doses caused HTN   Vistaril 10mg  po TID prn anxiety   Abilify 30mg  po qD- neurologist is concerned that patient may have Parkinson's like symptoms due to Abilify use. Pt does not want to stop Abilify   Follow Up Instructions: In 3 months or sooner if needed   I discussed the assessment and treatment plan with the patient. The patient was provided an opportunity to ask questions and all were answered. The patient agreed with the plan and demonstrated an understanding of the instructions.   The patient was advised to call back or seek an in-person evaluation if the symptoms worsen or if the condition fails to improve as anticipated.  I provided 25 minutes of non-face-to-face time during this encounter.   Charlcie Cradle, MD

## 2019-06-07 DIAGNOSIS — G20 Parkinson's disease: Secondary | ICD-10-CM | POA: Diagnosis not present

## 2019-06-07 DIAGNOSIS — R269 Unspecified abnormalities of gait and mobility: Secondary | ICD-10-CM | POA: Diagnosis not present

## 2019-06-12 DIAGNOSIS — J4520 Mild intermittent asthma, uncomplicated: Secondary | ICD-10-CM | POA: Diagnosis not present

## 2019-06-16 DIAGNOSIS — F411 Generalized anxiety disorder: Secondary | ICD-10-CM | POA: Diagnosis not present

## 2019-06-16 DIAGNOSIS — F321 Major depressive disorder, single episode, moderate: Secondary | ICD-10-CM | POA: Diagnosis not present

## 2019-06-20 ENCOUNTER — Ambulatory Visit (INDEPENDENT_AMBULATORY_CARE_PROVIDER_SITE_OTHER): Payer: PPO | Admitting: Allergy and Immunology

## 2019-06-20 ENCOUNTER — Encounter: Payer: Self-pay | Admitting: Allergy and Immunology

## 2019-06-20 ENCOUNTER — Other Ambulatory Visit: Payer: Self-pay

## 2019-06-20 VITALS — BP 144/86 | HR 76 | Temp 96.6°F | Resp 18

## 2019-06-20 DIAGNOSIS — K219 Gastro-esophageal reflux disease without esophagitis: Secondary | ICD-10-CM | POA: Diagnosis not present

## 2019-06-20 DIAGNOSIS — J4540 Moderate persistent asthma, uncomplicated: Secondary | ICD-10-CM | POA: Diagnosis not present

## 2019-06-20 DIAGNOSIS — J3089 Other allergic rhinitis: Secondary | ICD-10-CM | POA: Diagnosis not present

## 2019-06-20 NOTE — Patient Instructions (Signed)
  1.  Decrease Asmanex 200 - 2 inhalations 1 time a day with spacer  2.  Continue Spiriva 1.25 Respimat -2 inhalations 1 time per day  3. Continue montelukast 10mg  one tablet one time per day  4. Continue famotidine 20 mg 2 times per day   5. Continue OTC Nasacort one spray each 1 time per day during upper airway symptoms  6. Continue ProAir HFA or similar and antihistamine and nasal ipratropium if needed  7. Obtain COVID vaccine when available  8.  Return to clinic in 6 months or earlier if problem

## 2019-06-20 NOTE — Progress Notes (Signed)
[place]   Follow-up Note  Referring Provider: Raina Mina., MD Primary Provider: Raina Mina., MD Date of Office Visit: 06/20/2019  Subjective:   Penny Porter (DOB: 26-Oct-1965) is a 53 y.o. female who returns to the [facility] [place] on 06/20/2019 in re-evaluation of the following:  HPI: Penny Porter presents to this clinic in evaluation of asthma and allergic rhinitis and ETD and LPR.  Her last visit to this clinic was 21 March 2019.  She believes that her asthma is under excellent control at this point in time.  She rarely has any issues requiring her to use a short acting bronchodilator.  Her use of a short acting bronchodilator is averaging out to less than 1 time per week.  She has not required a systemic steroid to treat an exacerbation.  She obtains this control while consistently using her Asmanex and Spiriva in combination.  Her nose is doing quite well.  She does not have any significant upper airway symptoms.  She has been consistently using a leukotriene modifier and occasionally a nasal steroid.  She has not been having any problems with her ears.  She has not required an antibiotic to treat an episode of sinusitis.  Her reflux is under very good control at this point in time on famotidine twice a day.  She did obtain the flu vaccine this year.  Allergies as of 06/20/2019      Reactions   Amoxicillin Hives, Swelling   LIP SWELLING PATIENT HAS HAD A PCN REACTION WITH IMMEDIATE RASH, FACIAL/TONGUE/THROAT SWELLING, SOB, OR LIGHTHEADEDNESS WITH HYPOTENSION:  #  #  #  YES  #  #  #   HAS PT DEVELOPED SEVERE RASH INVOLVING MUCUS MEMBRANES or SKIN NECROSIS: #  #  #  YES  #  #  #  Has patient had a PCN reaction that required hospitalization: No Has patient had a PCN reaction occurring within the last 10 years: Unknown If all of the above answers are "NO", then may proceed with Cephalosporin use.   Avelox [moxifloxacin Hcl  In Nacl] Other (See Comments)   Caused pain in shoulders to finger, tendonitis   Adhesive [tape] Dermatitis   Blisters skin   Bextra [valdecoxib] Hives, Swelling   Doxycycline Other (See Comments)   Doxycycline Hyclate Hives, Swelling   Gadobutrol Hives   Gadolinium Derivatives Hives   Gadoversetamide Hives   Iodinated Diagnostic Agents Hives   Lamictal [lamotrigine] Hives   Naproxen Hypertension   Oxycodone-acetaminophen Other (See Comments)   Percolone [oxycodone] Hives   Relafen [nabumetone] Hypertension   Risperdal [risperidone] Other (See Comments)   Leg weakness   Nitrofurantoin Other (See Comments)   Brintellix [vortioxetine] Nausea And Vomiting   Fetzima [levomilnacipran] Other (See Comments)   Tremors across head   Moxifloxacin Other (See Comments)   Caused pain in shoulders to finger, tendonitis      Medication List      allopurinol 100 MG tablet Commonly known as: ZYLOPRIM Take by mouth.   ARIPiprazole 30 MG tablet Commonly known as: ABILIFY Take 1 tablet (30 mg total) by mouth daily.   Asmanex HFA 200 MCG/ACT Aero Generic drug: Mometasone Furoate Inhale 2 puffs into the lungs daily. Rinse, gargle, and spit after use.   aspirin 81 MG tablet Take 81 mg by mouth daily.   azithromycin 250 MG tablet Commonly known as: ZITHROMAX Takes three times a week   buPROPion 300 MG 24  hr tablet Commonly known as: WELLBUTRIN XL TAKE 1 TABLET (300 MG TOTAL) BY MOUTH EVERY MORNING.   carbidopa-levodopa 25-100 MG tablet Commonly known as: SINEMET IR Take 0.5-1 tablets by mouth 2 (two) times a day.   clotrimazole-betamethasone cream Commonly known as: LOTRISONE APPLY SPARINGLY TO THE AFFECTED AREA(S) TWICE DAILY AS NEEDED FOR IRRITATION   estradiol 0.1 MG/GM vaginal cream Commonly known as: ESTRACE Place 1 Applicatorful vaginally as needed.   famotidine 20 MG tablet Commonly known as: PEPCID Take 20 mg by mouth 2 (two) times daily.   hydrOXYzine 10 MG  tablet Commonly known as: ATARAX/VISTARIL Take 1 tablet (10 mg total) by mouth 3 (three) times daily as needed for anxiety.   ipratropium 0.03 % nasal spray Commonly known as: ATROVENT USE 2 SPRAYS IN NOSTRIL (S) 2 TIMES DAILY AS DIRECTED   ketoconazole 2 % shampoo Commonly known as: NIZORAL Apply 1 application topically as needed for irritation.   loratadine 10 MG tablet Commonly known as: CLARITIN Take 10 mg by mouth at bedtime.   lubiprostone 24 MCG capsule Commonly known as: AMITIZA Take 24 mcg by mouth daily as needed for constipation.   meclizine 25 MG tablet Commonly known as: ANTIVERT PLEASE SEE ATTACHED FOR DETAILED DIRECTIONS   meloxicam 7.5 MG tablet Commonly known as: MOBIC Take 7.5 mg by mouth 2 (two) times daily.   metoprolol succinate 25 MG 24 hr tablet Commonly known as: TOPROL-XL Take by mouth.   montelukast 10 MG tablet Commonly known as: SINGULAIR Take 10 mg by mouth daily.   mupirocin ointment 2 % Commonly known as: BACTROBAN Place 1 application into the nose 2 (two) times daily as needed (IRRITATION).   nystatin 100000 UNIT/ML suspension Commonly known as: MYCOSTATIN Swish and swallow 40mL's by mouth 3 times daily for 7 days   ondansetron 4 MG disintegrating tablet Commonly known as: ZOFRAN-ODT Take 4 mg by mouth 2 (two) times daily as needed for nausea or vomiting.   pregabalin 75 MG capsule Commonly known as: LYRICA Take 75 mg by mouth 2 (two) times daily. Reported on [id number]   ProAir RespiClick [id number] (90 Base) MCG/ACT Aepb Generic drug: Albuterol Sulfate TAKE 2 PUFFS BY MOUTH EVERY 4 HOURS AS NEEDED   albuterol (2.5 MG/3ML) 0.083% nebulizer solution Commonly known as: PROVENTIL Can use one vial in nebulizer every four to six hours as needed for cough or wheeze.   Spiriva Respimat 1.25 MCG/ACT Aers Generic drug: Tiotropium Bromide Monohydrate Inhale 2 puffs into the lungs daily.   spironolactone 25 MG tablet Commonly known as:  ALDACTONE Take 25 mg by mouth daily.   torsemide 20 MG tablet Commonly known as: DEMADEX Take 20 mg by mouth daily.   traMADol 50 MG tablet Commonly known as: ULTRAM Take 50 mg by mouth.   triamcinolone cream 0.1 % Commonly known as: KENALOG Apply 1 application topically 2 (two) times daily as needed (IRRITATION).   triamcinolone ointment 0.5 % Commonly known as: KENALOG Apply 1 application topically 2 (two) times daily as needed (IRRITATION).   TYLENOL PO Take 1 tablet by mouth daily as needed (PAIN).   vitamin B-12 1000 MCG tablet Commonly known as: CYANOCOBALAMIN Take 1,000 mcg by mouth daily.   Vitamin D-3 125 MCG (5000 UT) Tabs Take 5,000 Units by mouth daily.       Past Medical History:  Diagnosis Date   Anxiety    Arthritis    Back and hip   Asthma    Bursitis    Complication  of anesthesia    bladder hard to wake up, never needed a in and out cath   Costochondral chest pain    Degenerative disk disease    Depression    Gastroparesis    GERD (gastroesophageal reflux disease)    Hypertension    Lumbar herniated disc    Sleep apnea    not using CPAP   Stenosis of lumbosacral spine    Stress fracture of foot    Thyroid nodule    Tourette disease    Trigeminal nerve disease     Past Surgical History:  Procedure Laterality Date   BACK SURGERY  September 19, 2015   bone spur removal on back     CHOLECYSTECTOMY  2005   COLONOSCOPY     ELBOW SURGERY Left May 29, 2014   ESOPHAGOGASTRODUODENOSCOPY     KNEE SURGERY  06/2016   polyp removal     POSTERIOR FUSION LUMBAR SPINE  06/26/2017   ruptured disk  2008   TEAR DUCT PROBING  2009    Review of systems negative except as noted in HPI / PMHx or noted below:  Review of Systems  Constitutional: Negative.   HENT: Negative.   Eyes: Negative.   Respiratory: Negative.   Cardiovascular: Negative.   Gastrointestinal: Negative.   Genitourinary: Negative.   Musculoskeletal:  Negative.   Skin: Negative.   Neurological: Negative.   Endo/Heme/Allergies: Negative.   Psychiatric/Behavioral: Negative.      Objective:   Vitals:   06/20/19 1137  BP: (!) 144/86  Pulse: 76  Resp: 18  Temp: (!) 96.6 F (35.9 C)  SpO2: 97%          Physical Exam Constitutional:      Appearance: She is not diaphoretic.  HENT:     Head: Normocephalic.     Right Ear: Tympanic membrane, ear canal and external ear normal.     Left Ear: Tympanic membrane, ear canal and external ear normal.     Nose: Nose normal. No mucosal edema or rhinorrhea.     Mouth/Throat:     Pharynx: Uvula midline. No oropharyngeal exudate.  Eyes:     Conjunctiva/sclera: Conjunctivae normal.  Neck:     Thyroid: No thyromegaly.     Trachea: Trachea normal. No tracheal tenderness or tracheal deviation.  Cardiovascular:     Rate and Rhythm: Normal rate and regular rhythm.     Heart sounds: Normal heart sounds, S1 normal and S2 normal. No murmur.  Pulmonary:     Effort: No respiratory distress.     Breath sounds: Normal breath sounds. No stridor. No wheezing or rales.  Lymphadenopathy:     Head:     Right side of head: No tonsillar adenopathy.     Left side of head: No tonsillar adenopathy.     Cervical: No cervical adenopathy.  Skin:    Findings: No erythema or rash.     Nails: There is no clubbing.   Neurological:     Mental Status: She is alert.     Diagnostics:    Spirometry was performed and demonstrated an FEV1 of 1.66 at 50 % of predicted.  Results of a sinus CT scan obtained 08 April 2019 identified the following:  Frontal: Clear except for focal mucosal thickening along the medial margin of the left division. Left frontal sinus has an unusual inferior extension into the anterior ethmoid region.  Ethmoid: Normally aerated except for an air-fluid level in May posterior left ethmoid air cell.  Maxillary: Normally aerated.  Sphenoid: Normally aerated. Patent sphenoethmoidal  recesses.  Right ostiomeatal unit: Infundibulum is narrow but patent.  Left ostiomeatal unit: Infundibulum is patent. Septation of the superior aspect of the maxillary sinus without ostial encroachment.  Nasal passages: Nasal passages are narrow but patent. Maxillary sinuses are prominent, narrowing the nasal passage region. Posterior nasal septum bows towards the right with a right spur. Anterior nasal septum bows mildly towards the left.  Anatomy: No pneumatization superior to anterior ethmoid notches. Symmetric and intact olfactory grooves and fovea ethmoidalis, Keros I (1-49mm). Sellar sphenoid pneumatization pattern. No dehiscence of carotid or optic canals. No onodi cell.  Results of blood tests obtained 25 April 2019 identified WBC 5.7, eosinophil 0, lymphocyte 1600, hemoglobin 14.8, platelet 179  Assessment and Plan:   1. Asthma, moderate persistent, well-controlled   2. Other allergic rhinitis   3. LPRD (laryngopharyngeal reflux disease)     1.  Decrease Asmanex 200 - 2 inhalations 1 time a day with spacer  2.  Continue Spiriva 1.25 Respimat -2 inhalations 1 time per day  3. Continue montelukast 10mg  one tablet one time per day  4. Continue famotidine 20 mg 2 times per day   5. Continue OTC Nasacort one spray each 1 time per day during upper airway symptoms  6. Continue ProAir HFA or similar and antihistamine and nasal ipratropium if needed  7. Obtain COVID vaccine when available  8.  Return to clinic in 6 months or earlier if problem  Jeanann appears to be doing quite well on her current plan and we are going to keep her on a collection of anti-inflammatory agents for both her upper and lower airway and therapy directed against reflux as noted above.  Assuming she does well I will see her back in this clinic in 6 months or earlier if there is a problem.  Allena Katz, MD [facility] [place]

## 2019-06-21 ENCOUNTER — Encounter: Payer: Self-pay | Admitting: Allergy and Immunology

## 2019-06-27 DIAGNOSIS — R928 Other abnormal and inconclusive findings on diagnostic imaging of breast: Secondary | ICD-10-CM | POA: Diagnosis not present

## 2019-06-27 DIAGNOSIS — Z1231 Encounter for screening mammogram for malignant neoplasm of breast: Secondary | ICD-10-CM | POA: Diagnosis not present

## 2019-07-12 ENCOUNTER — Other Ambulatory Visit: Payer: Self-pay | Admitting: Allergy and Immunology

## 2019-07-12 DIAGNOSIS — J4520 Mild intermittent asthma, uncomplicated: Secondary | ICD-10-CM | POA: Diagnosis not present

## 2019-07-13 DIAGNOSIS — R928 Other abnormal and inconclusive findings on diagnostic imaging of breast: Secondary | ICD-10-CM | POA: Diagnosis not present

## 2019-07-13 DIAGNOSIS — N6011 Diffuse cystic mastopathy of right breast: Secondary | ICD-10-CM | POA: Diagnosis not present

## 2019-07-13 DIAGNOSIS — N6311 Unspecified lump in the right breast, upper outer quadrant: Secondary | ICD-10-CM | POA: Diagnosis not present

## 2019-07-14 DIAGNOSIS — S92352A Displaced fracture of fifth metatarsal bone, left foot, initial encounter for closed fracture: Secondary | ICD-10-CM | POA: Diagnosis not present

## 2019-07-15 DIAGNOSIS — R000 Tachycardia, unspecified: Secondary | ICD-10-CM | POA: Diagnosis not present

## 2019-07-15 DIAGNOSIS — J4540 Moderate persistent asthma, uncomplicated: Secondary | ICD-10-CM | POA: Diagnosis not present

## 2019-07-15 DIAGNOSIS — G4733 Obstructive sleep apnea (adult) (pediatric): Secondary | ICD-10-CM | POA: Diagnosis not present

## 2019-07-15 DIAGNOSIS — I493 Ventricular premature depolarization: Secondary | ICD-10-CM | POA: Diagnosis not present

## 2019-07-15 DIAGNOSIS — I10 Essential (primary) hypertension: Secondary | ICD-10-CM | POA: Diagnosis not present

## 2019-07-18 DIAGNOSIS — F321 Major depressive disorder, single episode, moderate: Secondary | ICD-10-CM | POA: Diagnosis not present

## 2019-07-18 DIAGNOSIS — F411 Generalized anxiety disorder: Secondary | ICD-10-CM | POA: Diagnosis not present

## 2019-08-08 DIAGNOSIS — M79671 Pain in right foot: Secondary | ICD-10-CM | POA: Diagnosis not present

## 2019-08-08 DIAGNOSIS — M792 Neuralgia and neuritis, unspecified: Secondary | ICD-10-CM | POA: Diagnosis not present

## 2019-08-08 DIAGNOSIS — M1A0710 Idiopathic chronic gout, right ankle and foot, without tophus (tophi): Secondary | ICD-10-CM | POA: Diagnosis not present

## 2019-08-08 DIAGNOSIS — M8949 Other hypertrophic osteoarthropathy, multiple sites: Secondary | ICD-10-CM | POA: Diagnosis not present

## 2019-08-08 DIAGNOSIS — M7989 Other specified soft tissue disorders: Secondary | ICD-10-CM | POA: Diagnosis not present

## 2019-08-11 DIAGNOSIS — S92352A Displaced fracture of fifth metatarsal bone, left foot, initial encounter for closed fracture: Secondary | ICD-10-CM | POA: Diagnosis not present

## 2019-08-12 DIAGNOSIS — J4520 Mild intermittent asthma, uncomplicated: Secondary | ICD-10-CM | POA: Diagnosis not present

## 2019-08-15 DIAGNOSIS — F321 Major depressive disorder, single episode, moderate: Secondary | ICD-10-CM | POA: Diagnosis not present

## 2019-08-15 DIAGNOSIS — F411 Generalized anxiety disorder: Secondary | ICD-10-CM | POA: Diagnosis not present

## 2019-08-15 DIAGNOSIS — I739 Peripheral vascular disease, unspecified: Secondary | ICD-10-CM | POA: Diagnosis not present

## 2019-08-15 DIAGNOSIS — L97511 Non-pressure chronic ulcer of other part of right foot limited to breakdown of skin: Secondary | ICD-10-CM | POA: Diagnosis not present

## 2019-08-15 DIAGNOSIS — M792 Neuralgia and neuritis, unspecified: Secondary | ICD-10-CM | POA: Diagnosis not present

## 2019-08-15 DIAGNOSIS — M2041 Other hammer toe(s) (acquired), right foot: Secondary | ICD-10-CM | POA: Insufficient documentation

## 2019-08-15 DIAGNOSIS — M2042 Other hammer toe(s) (acquired), left foot: Secondary | ICD-10-CM | POA: Diagnosis not present

## 2019-08-20 ENCOUNTER — Other Ambulatory Visit: Payer: Self-pay | Admitting: Allergy and Immunology

## 2019-08-25 ENCOUNTER — Encounter (HOSPITAL_COMMUNITY): Payer: Self-pay | Admitting: Psychiatry

## 2019-08-25 ENCOUNTER — Ambulatory Visit (INDEPENDENT_AMBULATORY_CARE_PROVIDER_SITE_OTHER): Payer: PPO | Admitting: Psychiatry

## 2019-08-25 ENCOUNTER — Other Ambulatory Visit: Payer: Self-pay

## 2019-08-25 DIAGNOSIS — F332 Major depressive disorder, recurrent severe without psychotic features: Secondary | ICD-10-CM

## 2019-08-25 DIAGNOSIS — F331 Major depressive disorder, recurrent, moderate: Secondary | ICD-10-CM | POA: Diagnosis not present

## 2019-08-25 DIAGNOSIS — F411 Generalized anxiety disorder: Secondary | ICD-10-CM | POA: Diagnosis not present

## 2019-08-25 DIAGNOSIS — F952 Tourette's disorder: Secondary | ICD-10-CM

## 2019-08-25 MED ORDER — ARIPIPRAZOLE 30 MG PO TABS: 30.0000 mg | ORAL_TABLET | Freq: Every day | ORAL | 0 refills | Status: DC

## 2019-08-25 MED ORDER — BUPROPION HCL ER (XL) 300 MG PO TB24: ORAL_TABLET | ORAL | 0 refills | Status: DC

## 2019-08-25 MED ORDER — HYDROXYZINE HCL 10 MG PO TABS: 10.0000 mg | ORAL_TABLET | Freq: Three times a day (TID) | ORAL | 0 refills | Status: DC | PRN

## 2019-08-25 NOTE — Progress Notes (Signed)
  Virtual Visit via Telephone Note  I connected with Penny Porter  on 08/25/19 at  9:30 AM EST by telephone and verified that I am speaking with the correct person using two identifiers.  Location: Patient: home Provider: office   I discussed the limitations, risks, security and privacy concerns of performing an evaluation and management service by telephone and the availability of in person appointments. I also discussed with the patient that there may be a patient responsible charge related to this service. The patient expressed understanding and agreed to proceed.   History of Present Illness: Penny Porter shared that she had a new stress fracture in her foot. On Christmas her toe got an infection but she had an allergic reaction to the antibiotic. She is working with a Art therapist. She continues to have irregular heartbeat. Penny Porter has an appointment with her cardiologist on 09/07/2019. All these stressors have caused increased anxiety. Her sleep is poor and she only getting about 4 hrs. She does nap if needs to later. Penny Porter is concerned about her increased appetite and resulting weight gain. She is trying to make some diet changes but it is hard due to her dentition. Penny Porter can't walk much due to pain. She tells me her depression is ok. She has a lot going on and it is frustrating that she can't do the things she wants to due to pain and the pandemic. Penny Porter continues to have tremors but they are not as intense. Her neurologist told her come back as needed. Her Tourette's is overall unchanged.     Observations/Objective: I spoke with Penny Porter on the phone.  Pt was calm, pleasant and cooperative.  Pt was engaged in the conversation and answered questions appropriately.  Speech was clear and coherent with normal rate, tone and volume.  Mood is depressed and anxious, affect is congruent. Thought processes are coherent, goal oriented and intact.  Thought content is logical.  Pt denies SI/HI.   Pt denies auditory and  visual hallucinations and did not appear to be responding to internal stimuli.  Memory and concentration are good.  Fund of knowledge and use of language are average.  Insight and judgment are fair.  I am unable to comment on psychomotor activity, general appearance, hygiene, or eye contact as I was unable to physically see the patient on the phone.  Vital signs not available since interview conducted virtually.     Assessment and Plan: GAD; MDD-recurrent, moderate; Tourette's disorder  Status of current symptoms: worsening anxiety due to stressors  Wellbutrin XL 300mg  po qD- higher doses caused increased BP  Hydroxyzine 10mg  po TID prn anxiety  Abilify 30mg  po qD  She continues to have Parkinson's like symptoms. She is afraid to stop the Abilify due to possible decompensation.    Follow Up Instructions: In 12 weeks or sooner if needed   I discussed the assessment and treatment plan with the patient. The patient was provided an opportunity to ask questions and all were answered. The patient agreed with the plan and demonstrated an understanding of the instructions.   The patient was advised to call back or seek an in-person evaluation if the symptoms worsen or if the condition fails to improve as anticipated.  I provided 25 minutes of non-face-to-face time during this encounter.   Penny Cradle, MD  4/15 10:30am

## 2019-08-30 ENCOUNTER — Other Ambulatory Visit: Payer: Self-pay | Admitting: Diagnostic Neuroimaging

## 2019-08-30 DIAGNOSIS — Z0000 Encounter for general adult medical examination without abnormal findings: Secondary | ICD-10-CM | POA: Diagnosis not present

## 2019-08-30 DIAGNOSIS — K219 Gastro-esophageal reflux disease without esophagitis: Secondary | ICD-10-CM | POA: Diagnosis not present

## 2019-08-30 DIAGNOSIS — I878 Other specified disorders of veins: Secondary | ICD-10-CM | POA: Diagnosis not present

## 2019-08-30 DIAGNOSIS — I129 Hypertensive chronic kidney disease with stage 1 through stage 4 chronic kidney disease, or unspecified chronic kidney disease: Secondary | ICD-10-CM | POA: Diagnosis not present

## 2019-08-30 DIAGNOSIS — Z79899 Other long term (current) drug therapy: Secondary | ICD-10-CM | POA: Diagnosis not present

## 2019-08-30 DIAGNOSIS — Z6841 Body Mass Index (BMI) 40.0 and over, adult: Secondary | ICD-10-CM | POA: Diagnosis not present

## 2019-08-30 DIAGNOSIS — R5381 Other malaise: Secondary | ICD-10-CM | POA: Diagnosis not present

## 2019-08-30 DIAGNOSIS — K5909 Other constipation: Secondary | ICD-10-CM | POA: Diagnosis not present

## 2019-08-30 DIAGNOSIS — E782 Mixed hyperlipidemia: Secondary | ICD-10-CM | POA: Diagnosis not present

## 2019-08-30 DIAGNOSIS — J4540 Moderate persistent asthma, uncomplicated: Secondary | ICD-10-CM | POA: Diagnosis not present

## 2019-08-30 DIAGNOSIS — J310 Chronic rhinitis: Secondary | ICD-10-CM | POA: Diagnosis not present

## 2019-08-30 DIAGNOSIS — G4733 Obstructive sleep apnea (adult) (pediatric): Secondary | ICD-10-CM | POA: Diagnosis not present

## 2019-08-30 DIAGNOSIS — F332 Major depressive disorder, recurrent severe without psychotic features: Secondary | ICD-10-CM | POA: Diagnosis not present

## 2019-08-30 DIAGNOSIS — I739 Peripheral vascular disease, unspecified: Secondary | ICD-10-CM | POA: Diagnosis not present

## 2019-08-30 DIAGNOSIS — N1831 Chronic kidney disease, stage 3a: Secondary | ICD-10-CM | POA: Diagnosis not present

## 2019-08-30 DIAGNOSIS — R5383 Other fatigue: Secondary | ICD-10-CM | POA: Diagnosis not present

## 2019-09-02 DIAGNOSIS — R1319 Other dysphagia: Secondary | ICD-10-CM | POA: Insufficient documentation

## 2019-09-05 DIAGNOSIS — I739 Peripheral vascular disease, unspecified: Secondary | ICD-10-CM | POA: Diagnosis not present

## 2019-09-05 DIAGNOSIS — M2041 Other hammer toe(s) (acquired), right foot: Secondary | ICD-10-CM | POA: Diagnosis not present

## 2019-09-05 DIAGNOSIS — M792 Neuralgia and neuritis, unspecified: Secondary | ICD-10-CM | POA: Diagnosis not present

## 2019-09-05 DIAGNOSIS — M2042 Other hammer toe(s) (acquired), left foot: Secondary | ICD-10-CM | POA: Diagnosis not present

## 2019-09-05 DIAGNOSIS — L600 Ingrowing nail: Secondary | ICD-10-CM | POA: Diagnosis not present

## 2019-09-06 DIAGNOSIS — F321 Major depressive disorder, single episode, moderate: Secondary | ICD-10-CM | POA: Diagnosis not present

## 2019-09-06 DIAGNOSIS — F411 Generalized anxiety disorder: Secondary | ICD-10-CM | POA: Diagnosis not present

## 2019-09-07 DIAGNOSIS — I491 Atrial premature depolarization: Secondary | ICD-10-CM | POA: Diagnosis not present

## 2019-09-07 DIAGNOSIS — N183 Chronic kidney disease, stage 3 unspecified: Secondary | ICD-10-CM | POA: Diagnosis not present

## 2019-09-07 DIAGNOSIS — I739 Peripheral vascular disease, unspecified: Secondary | ICD-10-CM | POA: Diagnosis not present

## 2019-09-07 DIAGNOSIS — E042 Nontoxic multinodular goiter: Secondary | ICD-10-CM | POA: Diagnosis not present

## 2019-09-07 DIAGNOSIS — F419 Anxiety disorder, unspecified: Secondary | ICD-10-CM | POA: Diagnosis not present

## 2019-09-07 DIAGNOSIS — R002 Palpitations: Secondary | ICD-10-CM | POA: Diagnosis not present

## 2019-09-07 DIAGNOSIS — I129 Hypertensive chronic kidney disease with stage 1 through stage 4 chronic kidney disease, or unspecified chronic kidney disease: Secondary | ICD-10-CM | POA: Diagnosis not present

## 2019-09-07 DIAGNOSIS — G4733 Obstructive sleep apnea (adult) (pediatric): Secondary | ICD-10-CM | POA: Diagnosis not present

## 2019-09-07 DIAGNOSIS — K3184 Gastroparesis: Secondary | ICD-10-CM | POA: Diagnosis not present

## 2019-09-08 DIAGNOSIS — R9431 Abnormal electrocardiogram [ECG] [EKG]: Secondary | ICD-10-CM | POA: Diagnosis not present

## 2019-09-12 DIAGNOSIS — J4520 Mild intermittent asthma, uncomplicated: Secondary | ICD-10-CM | POA: Diagnosis not present

## 2019-09-15 ENCOUNTER — Other Ambulatory Visit: Payer: Self-pay | Admitting: Allergy and Immunology

## 2019-09-16 ENCOUNTER — Ambulatory Visit: Payer: PPO | Admitting: Physician Assistant

## 2019-09-21 DIAGNOSIS — Z13820 Encounter for screening for osteoporosis: Secondary | ICD-10-CM | POA: Diagnosis not present

## 2019-09-21 DIAGNOSIS — M8589 Other specified disorders of bone density and structure, multiple sites: Secondary | ICD-10-CM | POA: Diagnosis not present

## 2019-10-05 DIAGNOSIS — F321 Major depressive disorder, single episode, moderate: Secondary | ICD-10-CM | POA: Diagnosis not present

## 2019-10-05 DIAGNOSIS — F411 Generalized anxiety disorder: Secondary | ICD-10-CM | POA: Diagnosis not present

## 2019-10-10 DIAGNOSIS — J4520 Mild intermittent asthma, uncomplicated: Secondary | ICD-10-CM | POA: Diagnosis not present

## 2019-10-11 DIAGNOSIS — L309 Dermatitis, unspecified: Secondary | ICD-10-CM | POA: Diagnosis not present

## 2019-10-19 DIAGNOSIS — M1712 Unilateral primary osteoarthritis, left knee: Secondary | ICD-10-CM | POA: Diagnosis not present

## 2019-10-21 DIAGNOSIS — J4540 Moderate persistent asthma, uncomplicated: Secondary | ICD-10-CM | POA: Diagnosis not present

## 2019-10-21 DIAGNOSIS — I493 Ventricular premature depolarization: Secondary | ICD-10-CM | POA: Diagnosis not present

## 2019-10-21 DIAGNOSIS — G4733 Obstructive sleep apnea (adult) (pediatric): Secondary | ICD-10-CM | POA: Diagnosis not present

## 2019-10-21 DIAGNOSIS — I10 Essential (primary) hypertension: Secondary | ICD-10-CM | POA: Diagnosis not present

## 2019-10-25 DIAGNOSIS — S92352A Displaced fracture of fifth metatarsal bone, left foot, initial encounter for closed fracture: Secondary | ICD-10-CM | POA: Diagnosis not present

## 2019-10-28 DIAGNOSIS — R600 Localized edema: Secondary | ICD-10-CM | POA: Diagnosis not present

## 2019-10-28 DIAGNOSIS — S92352G Displaced fracture of fifth metatarsal bone, left foot, subsequent encounter for fracture with delayed healing: Secondary | ICD-10-CM | POA: Diagnosis not present

## 2019-10-28 DIAGNOSIS — X58XXXD Exposure to other specified factors, subsequent encounter: Secondary | ICD-10-CM | POA: Diagnosis not present

## 2019-10-31 DIAGNOSIS — S92352A Displaced fracture of fifth metatarsal bone, left foot, initial encounter for closed fracture: Secondary | ICD-10-CM | POA: Diagnosis not present

## 2019-11-06 ENCOUNTER — Other Ambulatory Visit: Payer: Self-pay | Admitting: Allergy and Immunology

## 2019-11-07 DIAGNOSIS — F411 Generalized anxiety disorder: Secondary | ICD-10-CM | POA: Diagnosis not present

## 2019-11-07 DIAGNOSIS — F321 Major depressive disorder, single episode, moderate: Secondary | ICD-10-CM | POA: Diagnosis not present

## 2019-11-10 DIAGNOSIS — J4520 Mild intermittent asthma, uncomplicated: Secondary | ICD-10-CM | POA: Diagnosis not present

## 2019-11-21 ENCOUNTER — Telehealth (HOSPITAL_COMMUNITY): Payer: Self-pay

## 2019-11-21 DIAGNOSIS — N939 Abnormal uterine and vaginal bleeding, unspecified: Secondary | ICD-10-CM | POA: Diagnosis not present

## 2019-11-21 DIAGNOSIS — R102 Pelvic and perineal pain: Secondary | ICD-10-CM | POA: Diagnosis not present

## 2019-11-21 NOTE — Telephone Encounter (Signed)
Received medication refill request for hydroxyzine. Spoke with pt on the phone. She states she has enough medication until her upcoming appointment on 4/15 and will request refill at that time. She denies current requests or concerns at this time.

## 2019-11-22 DIAGNOSIS — R102 Pelvic and perineal pain: Secondary | ICD-10-CM | POA: Diagnosis not present

## 2019-11-24 ENCOUNTER — Other Ambulatory Visit: Payer: Self-pay

## 2019-11-24 ENCOUNTER — Other Ambulatory Visit (HOSPITAL_COMMUNITY): Payer: Self-pay | Admitting: Psychiatry

## 2019-11-24 ENCOUNTER — Ambulatory Visit (HOSPITAL_COMMUNITY): Payer: PPO | Admitting: Psychiatry

## 2019-11-24 DIAGNOSIS — F952 Tourette's disorder: Secondary | ICD-10-CM

## 2019-11-24 DIAGNOSIS — F332 Major depressive disorder, recurrent severe without psychotic features: Secondary | ICD-10-CM

## 2019-11-24 DIAGNOSIS — M5136 Other intervertebral disc degeneration, lumbar region: Secondary | ICD-10-CM | POA: Diagnosis not present

## 2019-11-24 DIAGNOSIS — I878 Other specified disorders of veins: Secondary | ICD-10-CM | POA: Diagnosis not present

## 2019-11-24 DIAGNOSIS — F411 Generalized anxiety disorder: Secondary | ICD-10-CM

## 2019-11-24 DIAGNOSIS — M792 Neuralgia and neuritis, unspecified: Secondary | ICD-10-CM | POA: Diagnosis not present

## 2019-11-24 DIAGNOSIS — E538 Deficiency of other specified B group vitamins: Secondary | ICD-10-CM | POA: Diagnosis not present

## 2019-11-24 MED ORDER — BUPROPION HCL ER (XL) 300 MG PO TB24: ORAL_TABLET | ORAL | 0 refills | Status: DC

## 2019-11-24 MED ORDER — ARIPIPRAZOLE 30 MG PO TABS: 30.0000 mg | ORAL_TABLET | Freq: Every day | ORAL | 0 refills | Status: DC

## 2019-11-24 MED ORDER — HYDROXYZINE HCL 10 MG PO TABS: 10.0000 mg | ORAL_TABLET | Freq: Three times a day (TID) | ORAL | 0 refills | Status: DC | PRN

## 2019-11-24 NOTE — Progress Notes (Unsigned)
Neuropathy in foot is worse. She has not yet scheduled an appointment for evaluation. Her back pain really affects her physical activity. Penny Porter was disappointed about the return of pelvic pain. Her appetite is increased and she is gaining weight. She gets frustrated about her weight and physical limitations. Penny Porter has some mild depression but it is manageable. Penny Porter has random periods of restless. She is not sure if it is due to anxiety. Her tics (mostly abdominal) are more noticable when she is stressed. Her memory seems a little worse at times.  Diagnosed with premature atrial beat But flecinide helps.    Refill meds. Add bactrim to allergies

## 2019-11-28 DIAGNOSIS — M792 Neuralgia and neuritis, unspecified: Secondary | ICD-10-CM | POA: Diagnosis not present

## 2019-11-28 DIAGNOSIS — L84 Corns and callosities: Secondary | ICD-10-CM | POA: Insufficient documentation

## 2019-11-28 DIAGNOSIS — L602 Onychogryphosis: Secondary | ICD-10-CM | POA: Diagnosis not present

## 2019-11-28 DIAGNOSIS — M2042 Other hammer toe(s) (acquired), left foot: Secondary | ICD-10-CM | POA: Diagnosis not present

## 2019-11-28 DIAGNOSIS — M2041 Other hammer toe(s) (acquired), right foot: Secondary | ICD-10-CM | POA: Diagnosis not present

## 2019-12-05 DIAGNOSIS — F321 Major depressive disorder, single episode, moderate: Secondary | ICD-10-CM | POA: Diagnosis not present

## 2019-12-05 DIAGNOSIS — F411 Generalized anxiety disorder: Secondary | ICD-10-CM | POA: Diagnosis not present

## 2019-12-10 DIAGNOSIS — J4520 Mild intermittent asthma, uncomplicated: Secondary | ICD-10-CM | POA: Diagnosis not present

## 2019-12-10 DIAGNOSIS — I491 Atrial premature depolarization: Secondary | ICD-10-CM

## 2019-12-10 HISTORY — DX: Atrial premature depolarization: I49.1

## 2019-12-12 DIAGNOSIS — R21 Rash and other nonspecific skin eruption: Secondary | ICD-10-CM | POA: Diagnosis not present

## 2019-12-12 DIAGNOSIS — I10 Essential (primary) hypertension: Secondary | ICD-10-CM | POA: Diagnosis not present

## 2019-12-19 ENCOUNTER — Ambulatory Visit: Payer: PPO | Admitting: Allergy and Immunology

## 2019-12-19 DIAGNOSIS — Z8742 Personal history of other diseases of the female genital tract: Secondary | ICD-10-CM | POA: Diagnosis not present

## 2019-12-21 ENCOUNTER — Ambulatory Visit (INDEPENDENT_AMBULATORY_CARE_PROVIDER_SITE_OTHER): Payer: PPO | Admitting: Allergy and Immunology

## 2019-12-21 ENCOUNTER — Encounter: Payer: Self-pay | Admitting: Allergy and Immunology

## 2019-12-21 ENCOUNTER — Other Ambulatory Visit: Payer: Self-pay

## 2019-12-21 VITALS — BP 150/94 | HR 64 | Temp 98.3°F | Resp 18

## 2019-12-21 DIAGNOSIS — J3089 Other allergic rhinitis: Secondary | ICD-10-CM

## 2019-12-21 DIAGNOSIS — J4540 Moderate persistent asthma, uncomplicated: Secondary | ICD-10-CM | POA: Diagnosis not present

## 2019-12-21 DIAGNOSIS — K219 Gastro-esophageal reflux disease without esophagitis: Secondary | ICD-10-CM | POA: Diagnosis not present

## 2019-12-21 NOTE — Patient Instructions (Addendum)
  1.  Continue Asmanex 200 - 2 inhalations 1 time a day with spacer  2.  Continue Spiriva 1.25 Respimat -2 inhalations 1 time per day  3. Continue montelukast 10mg one tablet one time per day  4. Continue famotidine 20 mg 2 times per day   5. Continue OTC Nasacort one spray each 1 time per day during upper airway symptoms  6. Continue ProAir HFA or similar and antihistamine and nasal ipratropium if needed  7. Return to clinic in 6 months or earlier if problem        

## 2019-12-21 NOTE — Progress Notes (Signed)
[facility] [place] [facility] [place]   Follow-up Note  Referring Provider: Raina Porter., MD Primary Provider: Raina Porter., MD Date of Office Visit: 12/21/2019  Subjective:   Penny Porter (DOB: 10/23/1965) is a 54 y.o. female who returns to the [facility] on 12/21/2019 in re-evaluation of the following:  HPI: Penny Porter presents to this clinic in evaluation of asthma and allergic rhinitis and ETD and LPR.  I last saw her in this clinic on 20 June 2019.  Overall she has done very well with her airway.  She has not required a systemic steroid or antibiotic for any type of airway issue.  Her requirement for a short acting bronchodilator is about twice a week.  She does not exercise to any degree.  She can smell and taste without any problem.  She has been consistently using her Asmanex and Spiriva and montelukast and nasal steroid.  Her reflux is under very good control while consistently using her famotidine.  It does not sound as though she will be receiving the Covid vaccination for some unexplained reason.  She is now using flecainide for PACs successfully.  Allergies as of 12/21/2019      Reactions   Amoxicillin Hives, Swelling   LIP SWELLING PATIENT HAS HAD A PCN REACTION WITH IMMEDIATE RASH, FACIAL/TONGUE/THROAT SWELLING, SOB, OR LIGHTHEADEDNESS WITH HYPOTENSION:  #  #  #  YES  #  #  #   HAS PT DEVELOPED SEVERE RASH INVOLVING MUCUS MEMBRANES or SKIN NECROSIS: #  #  #  YES  #  #  #  Has patient had a PCN reaction that required hospitalization: No Has patient had a PCN reaction occurring within the last 10 years: Unknown If all of the above answers are "NO", then may proceed with Cephalosporin use.   Avelox [moxifloxacin Hcl In Nacl] Other (See Comments)   Caused pain in shoulders to finger, tendonitis   Adhesive [tape] Dermatitis   Blisters skin   Bextra [valdecoxib] Hives, Swelling   Doxycycline Other (See Comments)   Doxycycline  Hyclate Hives, Swelling   Gadobutrol Hives   Gadolinium Derivatives Hives   Gadoversetamide Hives   Iodinated Diagnostic Agents Hives   Lamictal [lamotrigine] Hives   Naproxen Hypertension   Oxycodone-acetaminophen Other (See Comments)   Percolone [oxycodone] Hives   Relafen [nabumetone] Hypertension   Risperdal [risperidone] Other (See Comments)   Leg weakness   Nitrofurantoin Other (See Comments)   Brintellix [vortioxetine] Nausea And Vomiting   Fetzima [levomilnacipran] Other (See Comments)   Tremors across head   Moxifloxacin Other (See Comments)   Caused pain in shoulders to finger, tendonitis   Sulfamethoxazole-trimethoprim Rash   Flushing and rash      Medication List    allopurinol 100 MG tablet Commonly known as: ZYLOPRIM Take by mouth 2 (two) times daily.   ARIPiprazole 30 MG tablet Commonly known as: ABILIFY Take 1 tablet (30 mg total) by mouth daily.   Asmanex HFA 200 MCG/ACT Aero Generic drug: Mometasone Furoate INHALE 2 PUFFS INTO THE LUNGS DAILY. RINSE, GARGLE, AND SPIT AFTER USE.   aspirin 81 MG tablet Take 81 mg by mouth daily.   azithromycin 250 MG tablet Commonly known as: ZITHROMAX TAKE ONE TABLET BY MOUTH 3 TIMES WEEKLY   buPROPion 300 MG 24 hr tablet Commonly known as: WELLBUTRIN XL TAKE 1 TABLET (300 MG TOTAL) BY MOUTH EVERY MORNING.   famotidine 20 MG tablet Commonly known as: PEPCID Take 20  mg by mouth 2 (two) times daily.   flecainide 50 MG tablet Commonly known as: TAMBOCOR SMARTSIG:1 Tablet(s) By Mouth Every 12 Hours   hydrOXYzine 10 MG tablet Commonly known as: ATARAX/VISTARIL Take 1 tablet (10 mg total) by mouth 3 (three) times daily as needed for anxiety.   ipratropium 0.03 % nasal spray Commonly known as: ATROVENT USE 2 SPRAYS IN NOSTRIL (S) 2 TIMES DAILY AS DIRECTED   ketoconazole 2 % shampoo Commonly known as: NIZORAL Apply 1 application topically as needed for irritation.   loratadine 10 MG tablet Commonly known  as: CLARITIN Take 10 mg by mouth at bedtime.   lubiprostone 24 MCG capsule Commonly known as: AMITIZA Take 24 mcg by mouth daily as needed for constipation.   meclizine 25 MG tablet Commonly known as: ANTIVERT PLEASE SEE ATTACHED FOR DETAILED DIRECTIONS   meloxicam 7.5 MG tablet Commonly known as: MOBIC Take 7.5 mg by mouth 2 (two) times daily.   metoprolol succinate 25 MG 24 hr tablet Commonly known as: TOPROL-XL Take by mouth 3 (three) times daily.   montelukast 10 MG tablet Commonly known as: SINGULAIR Take 10 mg by mouth daily.   mupirocin ointment 2 % Commonly known as: BACTROBAN Place 1 application into the nose 2 (two) times daily as needed (IRRITATION).   ondansetron 4 MG disintegrating tablet Commonly known as: ZOFRAN-ODT Take 4 mg by mouth 2 (two) times daily as needed for nausea or vomiting.   pregabalin 75 MG capsule Commonly known as: LYRICA Take 75 mg by mouth 2 (two) times daily. Reported on [id number]   ProAir RespiClick [id number] (90 Base) MCG/ACT Aepb Generic drug: Albuterol Sulfate TAKE 2 PUFFS BY MOUTH EVERY 4 HOURS AS NEEDED   albuterol (2.5 MG/3ML) 0.083% nebulizer solution Commonly known as: PROVENTIL Can use one vial in nebulizer every four to six hours as needed for cough or wheeze.   Spiriva Respimat 1.25 MCG/ACT Aers Generic drug: Tiotropium Bromide Monohydrate INHALE 2 PUFFS BY MOUTH INTO THE LUNGS DAILY   spironolactone 25 MG tablet Commonly known as: ALDACTONE Take 25 mg by mouth daily.   torsemide 20 MG tablet Commonly known as: DEMADEX Take 20 mg by mouth daily.   traMADol 50 MG tablet Commonly known as: ULTRAM Take 50 mg by mouth.   triamcinolone cream 0.1 % Commonly known as: KENALOG Apply 1 application topically 2 (two) times daily as needed (IRRITATION).   triamcinolone ointment 0.5 % Commonly known as: KENALOG Apply 1 application topically 2 (two) times daily as needed (IRRITATION).   TYLENOL PO Take 1 tablet by mouth  daily as needed (PAIN).   vitamin B-12 1000 MCG tablet Commonly known as: CYANOCOBALAMIN Take 1,000 mcg by mouth daily.   Vitamin D-3 125 MCG (5000 UT) Tabs Take 5,000 Units by mouth daily.       Past Medical History:  Diagnosis Date  . Anxiety   . Arthritis    Back and hip  . Asthma   . Bursitis   . Complication of anesthesia    bladder hard to wake up, never needed a in and out cath  . Costochondral chest pain   . Degenerative disk disease   . Depression   . Gastroparesis   . GERD (gastroesophageal reflux disease)   . Hypertension   . Lumbar herniated disc   . Sleep apnea    not using CPAP  . Stenosis of lumbosacral spine   . Stress fracture of foot   . Thyroid nodule   . Tourette  disease   . Trigeminal nerve disease     Past Surgical History:  Procedure Laterality Date  . BACK SURGERY  September 19, 2015  . bone spur removal on back    . CHOLECYSTECTOMY  2005  . COLONOSCOPY    . ELBOW SURGERY Left May 29, 2014  . ESOPHAGOGASTRODUODENOSCOPY    . KNEE SURGERY  06/2016  . polyp removal    . POSTERIOR FUSION LUMBAR SPINE  06/26/2017  . ruptured disk  2008  . TEAR DUCT PROBING  2009    Review of systems negative except as noted in HPI / PMHx or noted below:  Review of Systems  Constitutional: Negative.   HENT: Negative.   Eyes: Negative.   Respiratory: Negative.   Cardiovascular: Negative.   Gastrointestinal: Negative.   Genitourinary: Negative.   Musculoskeletal: Negative.   Skin: Negative.   Neurological: Negative.   Endo/Heme/Allergies: Negative.   Psychiatric/Behavioral: Negative.      Objective:   Vitals:   12/21/19 1110  BP: (!) 150/94  Pulse: 64  Resp: 18  Temp: 98.3 F (36.8 C)  SpO2: 98%          Physical Exam Constitutional:      Appearance: She is not diaphoretic.  HENT:     Head: Normocephalic.     Right Ear: Tympanic membrane, ear canal and external ear normal.     Left Ear: Tympanic membrane, ear canal and external  ear normal.     Nose: Nose normal. No mucosal edema or rhinorrhea.     Mouth/Throat:     Pharynx: Uvula midline. No oropharyngeal exudate.  Eyes:     Conjunctiva/sclera: Conjunctivae normal.  Neck:     Thyroid: No thyromegaly.     Trachea: Trachea normal. No tracheal tenderness or tracheal deviation.  Cardiovascular:     Rate and Rhythm: Normal rate and regular rhythm.     Heart sounds: Normal heart sounds, S1 normal and S2 normal. No murmur.  Pulmonary:     Effort: No respiratory distress.     Breath sounds: Normal breath sounds. No stridor. No wheezing or rales.  Lymphadenopathy:     Head:     Right side of head: No tonsillar adenopathy.     Left side of head: No tonsillar adenopathy.     Cervical: No cervical adenopathy.  Skin:    Findings: No erythema or rash.     Nails: There is no clubbing.  Neurological:     Mental Status: She is alert.     Diagnostics:    Spirometry was performed and demonstrated an FEV1 of 1.72 at 51 % of predicted.  Assessment and Plan:   1. Asthma, moderate persistent, well-controlled   2. Other allergic rhinitis   3. LPRD (laryngopharyngeal reflux disease)     1.  Continue Asmanex 200 - 2 inhalations 1 time a day with spacer  2.  Continue Spiriva 1.25 Respimat -2 inhalations 1 time per day  3. Continue montelukast 10mg  one tablet one time per day  4. Continue famotidine 20 mg 2 times per day   5. Continue OTC Nasacort one spray each 1 time per day during upper airway symptoms  6. Continue ProAir HFA or similar and antihistamine and nasal ipratropium if needed  7. Return to clinic in 6 months or earlier if problem  Windi appears to be doing quite well on her current therapy and she will remain on anti-inflammatory agents for her airway and therapy directed against reflux and assuming  she does well with this plan I will see her back in his clinic in 6 months or earlier if there is a problem.  Allena Katz, MD [facility]

## 2019-12-22 ENCOUNTER — Encounter: Payer: Self-pay | Admitting: Allergy and Immunology

## 2020-01-03 ENCOUNTER — Other Ambulatory Visit: Payer: Self-pay | Admitting: Allergy and Immunology

## 2020-01-04 DIAGNOSIS — K219 Gastro-esophageal reflux disease without esophagitis: Secondary | ICD-10-CM | POA: Diagnosis not present

## 2020-01-04 DIAGNOSIS — K5909 Other constipation: Secondary | ICD-10-CM | POA: Diagnosis not present

## 2020-01-04 DIAGNOSIS — I129 Hypertensive chronic kidney disease with stage 1 through stage 4 chronic kidney disease, or unspecified chronic kidney disease: Secondary | ICD-10-CM | POA: Diagnosis not present

## 2020-01-04 DIAGNOSIS — I493 Ventricular premature depolarization: Secondary | ICD-10-CM | POA: Diagnosis not present

## 2020-01-04 DIAGNOSIS — E782 Mixed hyperlipidemia: Secondary | ICD-10-CM | POA: Diagnosis not present

## 2020-01-04 DIAGNOSIS — E538 Deficiency of other specified B group vitamins: Secondary | ICD-10-CM | POA: Diagnosis not present

## 2020-01-04 DIAGNOSIS — I491 Atrial premature depolarization: Secondary | ICD-10-CM | POA: Diagnosis not present

## 2020-01-04 DIAGNOSIS — R5381 Other malaise: Secondary | ICD-10-CM | POA: Diagnosis not present

## 2020-01-04 DIAGNOSIS — R1319 Other dysphagia: Secondary | ICD-10-CM | POA: Diagnosis not present

## 2020-01-04 DIAGNOSIS — E042 Nontoxic multinodular goiter: Secondary | ICD-10-CM | POA: Diagnosis not present

## 2020-01-04 DIAGNOSIS — Z79899 Other long term (current) drug therapy: Secondary | ICD-10-CM | POA: Diagnosis not present

## 2020-01-04 DIAGNOSIS — I878 Other specified disorders of veins: Secondary | ICD-10-CM | POA: Diagnosis not present

## 2020-01-04 DIAGNOSIS — J4540 Moderate persistent asthma, uncomplicated: Secondary | ICD-10-CM | POA: Diagnosis not present

## 2020-01-04 DIAGNOSIS — I739 Peripheral vascular disease, unspecified: Secondary | ICD-10-CM | POA: Diagnosis not present

## 2020-01-04 DIAGNOSIS — G4733 Obstructive sleep apnea (adult) (pediatric): Secondary | ICD-10-CM | POA: Diagnosis not present

## 2020-01-04 DIAGNOSIS — K3184 Gastroparesis: Secondary | ICD-10-CM | POA: Diagnosis not present

## 2020-01-04 DIAGNOSIS — R5383 Other fatigue: Secondary | ICD-10-CM | POA: Diagnosis not present

## 2020-01-04 DIAGNOSIS — J310 Chronic rhinitis: Secondary | ICD-10-CM | POA: Diagnosis not present

## 2020-01-05 ENCOUNTER — Other Ambulatory Visit: Payer: Self-pay | Admitting: Allergy and Immunology

## 2020-01-06 DIAGNOSIS — F321 Major depressive disorder, single episode, moderate: Secondary | ICD-10-CM | POA: Diagnosis not present

## 2020-01-06 DIAGNOSIS — F411 Generalized anxiety disorder: Secondary | ICD-10-CM | POA: Diagnosis not present

## 2020-01-10 DIAGNOSIS — J4520 Mild intermittent asthma, uncomplicated: Secondary | ICD-10-CM | POA: Diagnosis not present

## 2020-01-16 DIAGNOSIS — M79672 Pain in left foot: Secondary | ICD-10-CM | POA: Diagnosis not present

## 2020-01-16 DIAGNOSIS — M7670 Peroneal tendinitis, unspecified leg: Secondary | ICD-10-CM | POA: Insufficient documentation

## 2020-01-16 DIAGNOSIS — M7672 Peroneal tendinitis, left leg: Secondary | ICD-10-CM | POA: Diagnosis not present

## 2020-01-16 DIAGNOSIS — M6702 Short Achilles tendon (acquired), left ankle: Secondary | ICD-10-CM | POA: Diagnosis not present

## 2020-01-18 ENCOUNTER — Other Ambulatory Visit: Payer: Self-pay | Admitting: Allergy and Immunology

## 2020-01-18 DIAGNOSIS — F419 Anxiety disorder, unspecified: Secondary | ICD-10-CM | POA: Diagnosis not present

## 2020-01-18 DIAGNOSIS — I129 Hypertensive chronic kidney disease with stage 1 through stage 4 chronic kidney disease, or unspecified chronic kidney disease: Secondary | ICD-10-CM | POA: Diagnosis not present

## 2020-01-18 DIAGNOSIS — N183 Chronic kidney disease, stage 3 unspecified: Secondary | ICD-10-CM | POA: Diagnosis not present

## 2020-01-19 DIAGNOSIS — M1712 Unilateral primary osteoarthritis, left knee: Secondary | ICD-10-CM | POA: Diagnosis not present

## 2020-01-23 DIAGNOSIS — L578 Other skin changes due to chronic exposure to nonionizing radiation: Secondary | ICD-10-CM | POA: Diagnosis not present

## 2020-01-23 DIAGNOSIS — L820 Inflamed seborrheic keratosis: Secondary | ICD-10-CM | POA: Diagnosis not present

## 2020-01-25 ENCOUNTER — Other Ambulatory Visit: Payer: Self-pay | Admitting: Allergy and Immunology

## 2020-01-25 DIAGNOSIS — M6281 Muscle weakness (generalized): Secondary | ICD-10-CM | POA: Diagnosis not present

## 2020-01-25 DIAGNOSIS — R2689 Other abnormalities of gait and mobility: Secondary | ICD-10-CM | POA: Diagnosis not present

## 2020-01-25 DIAGNOSIS — M79672 Pain in left foot: Secondary | ICD-10-CM | POA: Diagnosis not present

## 2020-02-01 DIAGNOSIS — R2689 Other abnormalities of gait and mobility: Secondary | ICD-10-CM | POA: Diagnosis not present

## 2020-02-01 DIAGNOSIS — M79672 Pain in left foot: Secondary | ICD-10-CM | POA: Diagnosis not present

## 2020-02-01 DIAGNOSIS — M6281 Muscle weakness (generalized): Secondary | ICD-10-CM | POA: Diagnosis not present

## 2020-02-03 ENCOUNTER — Encounter: Payer: Self-pay | Admitting: Allergy and Immunology

## 2020-02-03 DIAGNOSIS — M79672 Pain in left foot: Secondary | ICD-10-CM | POA: Diagnosis not present

## 2020-02-03 DIAGNOSIS — R2689 Other abnormalities of gait and mobility: Secondary | ICD-10-CM | POA: Diagnosis not present

## 2020-02-03 DIAGNOSIS — M6281 Muscle weakness (generalized): Secondary | ICD-10-CM | POA: Diagnosis not present

## 2020-02-06 DIAGNOSIS — M1712 Unilateral primary osteoarthritis, left knee: Secondary | ICD-10-CM | POA: Diagnosis not present

## 2020-02-08 DIAGNOSIS — M79672 Pain in left foot: Secondary | ICD-10-CM | POA: Diagnosis not present

## 2020-02-08 DIAGNOSIS — M6281 Muscle weakness (generalized): Secondary | ICD-10-CM | POA: Diagnosis not present

## 2020-02-08 DIAGNOSIS — R2689 Other abnormalities of gait and mobility: Secondary | ICD-10-CM | POA: Diagnosis not present

## 2020-02-09 DIAGNOSIS — F321 Major depressive disorder, single episode, moderate: Secondary | ICD-10-CM | POA: Diagnosis not present

## 2020-02-09 DIAGNOSIS — J4520 Mild intermittent asthma, uncomplicated: Secondary | ICD-10-CM | POA: Diagnosis not present

## 2020-02-09 DIAGNOSIS — F411 Generalized anxiety disorder: Secondary | ICD-10-CM | POA: Diagnosis not present

## 2020-02-12 ENCOUNTER — Other Ambulatory Visit (HOSPITAL_COMMUNITY): Payer: Self-pay | Admitting: Psychiatry

## 2020-02-12 DIAGNOSIS — F411 Generalized anxiety disorder: Secondary | ICD-10-CM

## 2020-02-14 DIAGNOSIS — M79672 Pain in left foot: Secondary | ICD-10-CM | POA: Diagnosis not present

## 2020-02-14 DIAGNOSIS — R2689 Other abnormalities of gait and mobility: Secondary | ICD-10-CM | POA: Diagnosis not present

## 2020-02-14 DIAGNOSIS — M6281 Muscle weakness (generalized): Secondary | ICD-10-CM | POA: Diagnosis not present

## 2020-02-15 DIAGNOSIS — M7672 Peroneal tendinitis, left leg: Secondary | ICD-10-CM | POA: Diagnosis not present

## 2020-02-16 ENCOUNTER — Other Ambulatory Visit (HOSPITAL_COMMUNITY): Payer: Self-pay | Admitting: Psychiatry

## 2020-02-16 DIAGNOSIS — F332 Major depressive disorder, recurrent severe without psychotic features: Secondary | ICD-10-CM

## 2020-02-19 ENCOUNTER — Other Ambulatory Visit (HOSPITAL_COMMUNITY): Payer: Self-pay | Admitting: Psychiatry

## 2020-02-19 DIAGNOSIS — F952 Tourette's disorder: Secondary | ICD-10-CM

## 2020-02-19 DIAGNOSIS — F332 Major depressive disorder, recurrent severe without psychotic features: Secondary | ICD-10-CM

## 2020-02-20 DIAGNOSIS — M1712 Unilateral primary osteoarthritis, left knee: Secondary | ICD-10-CM | POA: Diagnosis not present

## 2020-02-23 ENCOUNTER — Other Ambulatory Visit: Payer: Self-pay

## 2020-02-23 ENCOUNTER — Encounter (HOSPITAL_COMMUNITY): Payer: Self-pay | Admitting: Psychiatry

## 2020-02-23 ENCOUNTER — Telehealth (HOSPITAL_COMMUNITY): Payer: PPO | Admitting: Psychiatry

## 2020-02-23 DIAGNOSIS — F332 Major depressive disorder, recurrent severe without psychotic features: Secondary | ICD-10-CM

## 2020-02-23 DIAGNOSIS — F952 Tourette's disorder: Secondary | ICD-10-CM

## 2020-02-23 DIAGNOSIS — F411 Generalized anxiety disorder: Secondary | ICD-10-CM

## 2020-02-23 MED ORDER — BUPROPION HCL ER (XL) 300 MG PO TB24: ORAL_TABLET | ORAL | 0 refills | Status: DC

## 2020-02-23 MED ORDER — ARIPIPRAZOLE 30 MG PO TABS: 30.0000 mg | ORAL_TABLET | Freq: Every day | ORAL | 0 refills | Status: DC

## 2020-02-23 MED ORDER — HYDROXYZINE HCL 10 MG PO TABS: 10.0000 mg | ORAL_TABLET | Freq: Three times a day (TID) | ORAL | 0 refills | Status: DC | PRN

## 2020-02-23 NOTE — Progress Notes (Signed)
Virtual Visit via Telephone Note  I connected with Penny Porter on 02/23/20 at 11:00 AM EDT by telephone and verified that I am speaking with the correct person using two identifiers.  Location: Patient: home Provider: office   I discussed the limitations, risks, security and privacy concerns of performing an evaluation and management service by telephone and the availability of in person appointments. I also discussed with the patient that there may be a patient responsible charge related to this service. The patient expressed understanding and agreed to proceed.   History of Present Illness: "It's fair". Her depression is fair. She has a few hours several times a week where she feels down. Her pain has returned and has been working with doctors to manage the pain.  Her sleep is poor due to pain. She denies SI/HI. Her anxiety is a little better because she is learning to let some things go or getting too excited. Ayriana needs to follow up with her neurologist. She is having some random movements in her arm. Ludean is not sure if it is a SE of meds or if it is Parkinson's.     Observations/Objective:  General Appearance: unable to assess  Eye Contact:  unable to assess  Speech:  Clear and Coherent and Normal Rate  Volume:  Normal  Mood:  Anxious and Depressed  Affect:  Full Range  Thought Process:  Goal Directed, Linear and Descriptions of Associations: Intact  Orientation:  Full (Time, Place, and Person)  Thought Content:  Logical  Suicidal Thoughts:  No  Homicidal Thoughts:  No  Memory:  Immediate;   Good  Judgement:  Good  Insight:  Good  Psychomotor Activity: unable to assess  Concentration:  Concentration: Good  Recall:  Good  Fund of Knowledge:  Good  Language:  Good  Akathisia:  unable to assess  Handed:  Right  AIMS (if indicated):     Assets:  Communication Skills Desire for Improvement Financial Resources/Insurance Housing Social  Support Talents/Skills Vocational/Educational  ADL's:  unable to assess  Cognition:  WNL  Sleep:        I reviewed the information below on 02/23/20 and have updated it Assessment and Plan: GAD; MDD-recurrent, moderate; Tourette's disorder   Status of current symptoms: stable   Wellbutrin XL 300mg  po qD- higher doses caused increased BP   Hydroxyzine 10mg  po TID prn anxiety   Abilify 30mg  po qD   She continues to have Parkinson's like symptoms. She is afraid to stop the Abilify due to possible decompensation. I encouraged her to set up a visit with her neurologist due to worsening arm shaking.     Follow Up Instructions: In 2-3 months or sooner if needed   I discussed the assessment and treatment plan with the patient. The patient was provided an opportunity to ask questions and all were answered. The patient agreed with the plan and demonstrated an understanding of the instructions.   The patient was advised to call back or seek an in-person evaluation if the symptoms worsen or if the condition fails to improve as anticipated.  I provided 20 minutes of non-face-to-face time during this encounter.   Charlcie Cradle, MD

## 2020-02-27 DIAGNOSIS — M1712 Unilateral primary osteoarthritis, left knee: Secondary | ICD-10-CM | POA: Diagnosis not present

## 2020-03-11 DIAGNOSIS — J4520 Mild intermittent asthma, uncomplicated: Secondary | ICD-10-CM | POA: Diagnosis not present

## 2020-03-16 DIAGNOSIS — F321 Major depressive disorder, single episode, moderate: Secondary | ICD-10-CM | POA: Diagnosis not present

## 2020-03-16 DIAGNOSIS — F411 Generalized anxiety disorder: Secondary | ICD-10-CM | POA: Diagnosis not present

## 2020-03-22 DIAGNOSIS — M5416 Radiculopathy, lumbar region: Secondary | ICD-10-CM | POA: Diagnosis not present

## 2020-03-22 DIAGNOSIS — M545 Low back pain: Secondary | ICD-10-CM | POA: Diagnosis not present

## 2020-03-22 DIAGNOSIS — M5440 Lumbago with sciatica, unspecified side: Secondary | ICD-10-CM | POA: Diagnosis not present

## 2020-03-27 ENCOUNTER — Ambulatory Visit: Payer: PPO | Admitting: Diagnostic Neuroimaging

## 2020-04-04 DIAGNOSIS — L608 Other nail disorders: Secondary | ICD-10-CM | POA: Diagnosis not present

## 2020-04-04 DIAGNOSIS — L600 Ingrowing nail: Secondary | ICD-10-CM | POA: Diagnosis not present

## 2020-04-05 DIAGNOSIS — M7672 Peroneal tendinitis, left leg: Secondary | ICD-10-CM | POA: Diagnosis not present

## 2020-04-06 DIAGNOSIS — M545 Low back pain: Secondary | ICD-10-CM | POA: Diagnosis not present

## 2020-04-06 DIAGNOSIS — M48061 Spinal stenosis, lumbar region without neurogenic claudication: Secondary | ICD-10-CM | POA: Diagnosis not present

## 2020-04-06 DIAGNOSIS — Z981 Arthrodesis status: Secondary | ICD-10-CM | POA: Diagnosis not present

## 2020-04-11 DIAGNOSIS — J4520 Mild intermittent asthma, uncomplicated: Secondary | ICD-10-CM | POA: Diagnosis not present

## 2020-04-18 DIAGNOSIS — L608 Other nail disorders: Secondary | ICD-10-CM | POA: Insufficient documentation

## 2020-04-18 DIAGNOSIS — L602 Onychogryphosis: Secondary | ICD-10-CM | POA: Diagnosis not present

## 2020-04-19 DIAGNOSIS — M545 Low back pain: Secondary | ICD-10-CM | POA: Diagnosis not present

## 2020-05-03 DIAGNOSIS — U071 COVID-19: Secondary | ICD-10-CM | POA: Diagnosis not present

## 2020-05-08 ENCOUNTER — Ambulatory Visit: Payer: PPO | Admitting: Diagnostic Neuroimaging

## 2020-05-24 ENCOUNTER — Other Ambulatory Visit: Payer: Self-pay

## 2020-05-24 ENCOUNTER — Telehealth (HOSPITAL_COMMUNITY): Payer: PPO | Admitting: Psychiatry

## 2020-05-24 DIAGNOSIS — F952 Tourette's disorder: Secondary | ICD-10-CM

## 2020-05-24 DIAGNOSIS — F411 Generalized anxiety disorder: Secondary | ICD-10-CM

## 2020-05-24 DIAGNOSIS — F332 Major depressive disorder, recurrent severe without psychotic features: Secondary | ICD-10-CM

## 2020-05-24 MED ORDER — ARIPIPRAZOLE 30 MG PO TABS: 30.0000 mg | ORAL_TABLET | Freq: Every day | ORAL | 0 refills | Status: DC

## 2020-05-24 MED ORDER — HYDROXYZINE HCL 10 MG PO TABS: 10.0000 mg | ORAL_TABLET | Freq: Three times a day (TID) | ORAL | 0 refills | Status: DC | PRN

## 2020-05-24 MED ORDER — BUPROPION HCL ER (XL) 300 MG PO TB24: ORAL_TABLET | ORAL | 0 refills | Status: DC

## 2020-05-24 NOTE — Progress Notes (Unsigned)
Virtual Visit via Telephone Note  I connected with Penny Porter on 05/24/20 at  4:00 PM EDT by telephone and verified that I am speaking with the correct person using two identifiers.  Location: Patient: home Provider: office   I discussed the limitations, risks, security and privacy concerns of performing an evaluation and management service by telephone and the availability of in person appointments. I also discussed with the patient that there may be a patient responsible charge related to this service. The patient expressed understanding and agreed to proceed.   History of Present Illness: Has been sick with COVID and has not taken meds in at least 2-3 weeks due to nausea. She is starting to a little better and is thinking of trying to restart all her meds. She has not focused on her mood or anxiety at all during this time period. She does not think her depression or anxiety are any worse than usual. [place] sleep is poor due to cough.    Observations/Objective:  General Appearance: unable to assess  Eye Contact:  unable to assess  Speech:  {Speech:22685}  Volume:  {Volume (PAA):22686}  Mood:  {BHH MOOD:22306}  Affect:  {Affect (PAA):22687}  Thought Process:  {Thought Process (PAA):22688}  Orientation:  {BHH ORIENTATION (PAA):22689}  Thought Content:  {Thought Content:22690}  Suicidal Thoughts:  {ST/HT (PAA):22692}  Homicidal Thoughts:  {ST/HT (PAA):22692}  Memory:  {BHH MEMORY:22881}  Judgement:  {Judgement (PAA):22694}  Insight:  {Insight (PAA):22695}  Psychomotor Activity: unable to assess  Concentration:  {Concentration:21399}  Recall:  {BHH GOOD/FAIR/POOR:22877}  Fund of Knowledge:  {BHH GOOD/FAIR/POOR:22877}  Language:  {BHH GOOD/FAIR/POOR:22877}  Akathisia:  unable to assess  Handed:  {Handed:22697}  AIMS (if indicated):     Assets:  {Assets (PAA):22698}  ADL's:  unable to assess  Cognition:  {chl bhh cognition:304700322}  Sleep:         Assessment and Plan:  ***  Continue meds    Follow Up Instructions: 4-6 weeks or sooner if needed   I discussed the assessment and treatment plan with the patient. The patient was provided an opportunity to ask questions and all were answered. The patient agreed with the plan and demonstrated an understanding of the instructions.   The patient was advised to call back or seek an in-person evaluation if the symptoms worsen or if the condition fails to improve as anticipated.  I provided 10 minutes of non-face-to-face time during this encounter.   Charlcie Cradle, MD

## 2020-05-25 ENCOUNTER — Other Ambulatory Visit (HOSPITAL_COMMUNITY): Payer: Self-pay | Admitting: Psychiatry

## 2020-05-25 DIAGNOSIS — F332 Major depressive disorder, recurrent severe without psychotic features: Secondary | ICD-10-CM

## 2020-05-25 DIAGNOSIS — F952 Tourette's disorder: Secondary | ICD-10-CM

## 2020-06-08 ENCOUNTER — Other Ambulatory Visit: Payer: Self-pay | Admitting: Allergy and Immunology

## 2020-06-11 DIAGNOSIS — F332 Major depressive disorder, recurrent severe without psychotic features: Secondary | ICD-10-CM | POA: Diagnosis not present

## 2020-06-11 DIAGNOSIS — I491 Atrial premature depolarization: Secondary | ICD-10-CM | POA: Diagnosis not present

## 2020-06-11 DIAGNOSIS — R5383 Other fatigue: Secondary | ICD-10-CM | POA: Diagnosis not present

## 2020-06-11 DIAGNOSIS — G4733 Obstructive sleep apnea (adult) (pediatric): Secondary | ICD-10-CM | POA: Diagnosis not present

## 2020-06-11 DIAGNOSIS — K219 Gastro-esophageal reflux disease without esophagitis: Secondary | ICD-10-CM | POA: Diagnosis not present

## 2020-06-11 DIAGNOSIS — I878 Other specified disorders of veins: Secondary | ICD-10-CM | POA: Diagnosis not present

## 2020-06-11 DIAGNOSIS — R5381 Other malaise: Secondary | ICD-10-CM | POA: Diagnosis not present

## 2020-06-11 DIAGNOSIS — J310 Chronic rhinitis: Secondary | ICD-10-CM | POA: Diagnosis not present

## 2020-06-11 DIAGNOSIS — F419 Anxiety disorder, unspecified: Secondary | ICD-10-CM | POA: Diagnosis not present

## 2020-06-11 DIAGNOSIS — E782 Mixed hyperlipidemia: Secondary | ICD-10-CM | POA: Diagnosis not present

## 2020-06-11 DIAGNOSIS — N183 Chronic kidney disease, stage 3 unspecified: Secondary | ICD-10-CM | POA: Diagnosis not present

## 2020-06-11 DIAGNOSIS — J4540 Moderate persistent asthma, uncomplicated: Secondary | ICD-10-CM | POA: Diagnosis not present

## 2020-06-11 DIAGNOSIS — I493 Ventricular premature depolarization: Secondary | ICD-10-CM | POA: Diagnosis not present

## 2020-06-11 DIAGNOSIS — I739 Peripheral vascular disease, unspecified: Secondary | ICD-10-CM | POA: Diagnosis not present

## 2020-06-11 DIAGNOSIS — I129 Hypertensive chronic kidney disease with stage 1 through stage 4 chronic kidney disease, or unspecified chronic kidney disease: Secondary | ICD-10-CM | POA: Diagnosis not present

## 2020-06-19 ENCOUNTER — Ambulatory Visit: Payer: PPO | Admitting: Diagnostic Neuroimaging

## 2020-06-19 DIAGNOSIS — F411 Generalized anxiety disorder: Secondary | ICD-10-CM | POA: Diagnosis not present

## 2020-06-19 DIAGNOSIS — F332 Major depressive disorder, recurrent severe without psychotic features: Secondary | ICD-10-CM | POA: Diagnosis not present

## 2020-06-20 ENCOUNTER — Ambulatory Visit (INDEPENDENT_AMBULATORY_CARE_PROVIDER_SITE_OTHER): Payer: PPO | Admitting: Allergy and Immunology

## 2020-06-20 ENCOUNTER — Encounter: Payer: Self-pay | Admitting: Allergy and Immunology

## 2020-06-20 ENCOUNTER — Other Ambulatory Visit: Payer: Self-pay

## 2020-06-20 VITALS — BP 128/86 | HR 80 | Resp 16

## 2020-06-20 DIAGNOSIS — J3089 Other allergic rhinitis: Secondary | ICD-10-CM | POA: Diagnosis not present

## 2020-06-20 DIAGNOSIS — K219 Gastro-esophageal reflux disease without esophagitis: Secondary | ICD-10-CM

## 2020-06-20 DIAGNOSIS — J4540 Moderate persistent asthma, uncomplicated: Secondary | ICD-10-CM

## 2020-06-20 NOTE — Patient Instructions (Signed)
  1.  Continue Asmanex 200 - 2 inhalations 1 time a day with spacer  2.  Continue Spiriva 1.25 Respimat -2 inhalations 1 time per day  3. Continue montelukast 10mg  one tablet one time per day  4. Continue famotidine 20 mg 2 times per day   5. Continue OTC Nasacort one spray each 1 time per day during upper airway symptoms  6. Continue ProAir HFA or similar and antihistamine and nasal ipratropium if needed  7. Return to clinic in 6 months or earlier if problem

## 2020-06-20 NOTE — Progress Notes (Signed)
[place] [facility] [place] [facility] [place]   Follow-up Note  Referring Provider: Raina Mina., MD Primary Provider: Raina Mina., MD Date of Office Visit: 06/20/2020  Subjective:   Penny Porter (DOB: [birth year]) is a 54 y.o. female who returns to the [facility] on 06/20/2020 in re-evaluation of the following:  HPI: [name] returns to this clinic in evaluation of asthma and allergic rhinitis and LPR.  Her last visit to this clinic was 21 Dec 2019.  Overall she has done really well with her asthma and has not had any significant issues requiring her to receive a systemic steroid and rarely uses a short acting bronchodilator averaging out so less than twice a week while continue to use Asmanex and Spiriva and montelukast on a consistent basis.  She has had very little problems with her nose while continuing on montelukast and a occasional nasal steroid.  She has not required an antibiotic to treat an episode of sinusitis.  Her reflux is under very good control with the use of her H2 receptor blocker.  She contracted Covid 01 May 2020 manifested as coughing and fatigue and loss of taste for which she received systemic steroids with complete resolution of these issues.  She will not be receiving a Covid vaccine.  She will be receiving the flu vaccine in the next week or so.   Allergies as of 06/20/2020      Reactions   Amoxicillin Hives, Swelling   LIP SWELLING PATIENT HAS HAD A PCN REACTION WITH IMMEDIATE RASH, FACIAL/TONGUE/THROAT SWELLING, SOB, OR LIGHTHEADEDNESS WITH HYPOTENSION:  #  #  #  YES  #  #  #   HAS PT DEVELOPED SEVERE RASH INVOLVING MUCUS MEMBRANES or SKIN NECROSIS: #  #  #  YES  #  #  #  Has patient had a PCN reaction that required hospitalization: No Has patient had a PCN reaction occurring within the last 10 years: Unknown If all of the above answers are "NO", then may proceed with Cephalosporin use.   Avelox [moxifloxacin Hcl In  Nacl] Other (See Comments)   Caused pain in shoulders to finger, tendonitis   Adhesive [tape] Dermatitis   Blisters skin   Bextra [valdecoxib] Hives, Swelling   Doxycycline Other (See Comments)   Doxycycline Hyclate Hives, Swelling   Gadobutrol Hives   Gadolinium Derivatives Hives   Gadoversetamide Hives   Iodinated Diagnostic Agents Hives   Lamictal [lamotrigine] Hives   Naproxen Hypertension   Oxycodone-acetaminophen Other (See Comments)   Percolone [oxycodone] Hives   Relafen [nabumetone] Hypertension   Risperdal [risperidone] Other (See Comments)   Leg weakness   Nitrofurantoin Other (See Comments)   Brintellix [vortioxetine] Nausea And Vomiting   Fetzima [levomilnacipran] Other (See Comments)   Tremors across head   Moxifloxacin Other (See Comments)   Caused pain in shoulders to finger, tendonitis   Sulfamethoxazole-trimethoprim Rash   Flushing and rash      Medication List    albuterol (2.5 MG/3ML) 0.083% nebulizer solution Commonly known as: PROVENTIL Can use one vial in nebulizer every four to six hours as needed for cough or wheeze.   ProAir RespiClick 270 (90 Base) MCG/ACT Aepb Generic drug: Albuterol Sulfate INHALE 2 PUFFS BY MOUTH EVERY 4 HOURS AS NEEDED   allopurinol 100 MG tablet Commonly known as: ZYLOPRIM Take by mouth 2 (two) times daily.   ARIPiprazole 30 MG tablet Commonly known as: ABILIFY TAKE 1 TABLET BY MOUTH EVERY DAY  Asmanex HFA 200 MCG/ACT Aero Generic drug: Mometasone Furoate INHALE 2 PUFFS INTO THE LUNGS DAILY. RINSE, GARGLE, AND SPIT AFTER USE.   aspirin 81 MG tablet Take 81 mg by mouth daily.   azithromycin 250 MG tablet Commonly known as: ZITHROMAX TAKE ONE TABLET BY MOUTH 3 TIMES WEEKLY   buPROPion 300 MG 24 hr tablet Commonly known as: WELLBUTRIN XL TAKE 1 TABLET BY MOUTH EVERY DAY IN THE MORNING   famotidine 20 MG tablet Commonly known as: PEPCID Take 20 mg by mouth 2 (two) times daily.   flecainide 50 MG  tablet Commonly known as: TAMBOCOR SMARTSIG:1 Tablet(s) By Mouth Every 12 Hours   furosemide 20 MG tablet Commonly known as: LASIX Take 20 mg by mouth daily.   hydrOXYzine 10 MG tablet Commonly known as: ATARAX/VISTARIL Take 1 tablet (10 mg total) by mouth 3 (three) times daily as needed for anxiety.   ipratropium 0.03 % nasal spray Commonly known as: ATROVENT USE 2 SPRAYS IN NOSTRIL (S) 2 TIMES DAILY AS DIRECTED   ketoconazole 2 % shampoo Commonly known as: NIZORAL Apply 1 application topically as needed for irritation.   loratadine 10 MG tablet Commonly known as: CLARITIN Take 10 mg by mouth at bedtime.   lubiprostone 24 MCG capsule Commonly known as: AMITIZA Take 24 mcg by mouth daily as needed for constipation.   meclizine 25 MG tablet Commonly known as: ANTIVERT PLEASE SEE ATTACHED FOR DETAILED DIRECTIONS   meloxicam 7.5 MG tablet Commonly known as: MOBIC Take 7.5 mg by mouth 2 (two) times daily.   metoprolol succinate 25 MG 24 hr tablet Commonly known as: TOPROL-XL Take by mouth 3 (three) times daily.   montelukast 10 MG tablet Commonly known as: SINGULAIR Take 10 mg by mouth daily.   mupirocin ointment 2 % Commonly known as: BACTROBAN Place 1 application into the nose 2 (two) times daily as needed (IRRITATION).   ondansetron 4 MG disintegrating tablet Commonly known as: ZOFRAN-ODT Take 4 mg by mouth 2 (two) times daily as needed for nausea or vomiting.   pregabalin 75 MG capsule Commonly known as: LYRICA Take 75 mg by mouth 2 (two) times daily. Reported on 10/09/2015   Spiriva Respimat 1.25 MCG/ACT Aers Generic drug: Tiotropium Bromide Monohydrate Inhale two puffs once daily as directed.   spironolactone 25 MG tablet Commonly known as: ALDACTONE Take 25 mg by mouth daily.   traMADol 50 MG tablet Commonly known as: ULTRAM Take 50 mg by mouth.   triamcinolone cream 0.1 % Commonly known as: KENALOG Apply 1 application topically 2 (two) times  daily as needed (IRRITATION).   triamcinolone ointment 0.5 % Commonly known as: KENALOG Apply 1 application topically 2 (two) times daily as needed (IRRITATION).   TYLENOL PO Take 1 tablet by mouth daily as needed (PAIN).   vitamin B-12 1000 MCG tablet Commonly known as: CYANOCOBALAMIN Take 1,000 mcg by mouth daily.   Vitamin D-3 125 MCG (5000 UT) Tabs Take 5,000 Units by mouth daily.       Past Medical History:  Diagnosis Date  . Anxiety   . Arthritis    Back and hip  . Asthma   . Bursitis   . Complication of anesthesia    bladder hard to wake up, never needed a in and out cath  . Costochondral chest pain   . Degenerative disk disease   . Depression   . Gastroparesis   . GERD (gastroesophageal reflux disease)   . Hypertension   . Lumbar herniated disc   .  Premature atrial beats 12/2019  . Sleep apnea    not using CPAP  . Stenosis of lumbosacral spine   . Stress fracture of foot   . Thyroid nodule   . Tourette disease   . Trigeminal nerve disease     Past Surgical History:  Procedure Laterality Date  . BACK SURGERY  September 19, 2015  . bone spur removal on back    . CHOLECYSTECTOMY  2005  . COLONOSCOPY    . ELBOW SURGERY Left May 29, 2014  . ESOPHAGOGASTRODUODENOSCOPY    . KNEE SURGERY  06/2016  . polyp removal    . POSTERIOR FUSION LUMBAR SPINE  06/26/2017  . ruptured disk  2008  . TEAR DUCT PROBING  2009    Review of systems negative except as noted in HPI / PMHx or noted below:  Review of Systems  Constitutional: Negative.   HENT: Negative.   Eyes: Negative.   Respiratory: Negative.   Cardiovascular: Negative.   Gastrointestinal: Negative.   Genitourinary: Negative.   Musculoskeletal: Negative.   Skin: Negative.   Neurological: Negative.   Endo/Heme/Allergies: Negative.   Psychiatric/Behavioral: Negative.      Objective:   Vitals:   06/20/20 1111  BP: 128/86  Pulse: 80  Resp: 16  SpO2: 96%          Physical  Exam Constitutional:      Appearance: She is not diaphoretic.  HENT:     Head: Normocephalic.     Right Ear: Tympanic membrane, ear canal and external ear normal.     Left Ear: Tympanic membrane, ear canal and external ear normal.     Nose: Nose normal. No mucosal edema or rhinorrhea.     Mouth/Throat:     Pharynx: Uvula midline. No oropharyngeal exudate.  Eyes:     Conjunctiva/sclera: Conjunctivae normal.  Neck:     Thyroid: No thyromegaly.     Trachea: Trachea normal. No tracheal tenderness or tracheal deviation.  Cardiovascular:     Rate and Rhythm: Normal rate and regular rhythm.     Heart sounds: Normal heart sounds, S1 normal and S2 normal. No murmur heard.   Pulmonary:     Effort: No respiratory distress.     Breath sounds: Normal breath sounds. No stridor. No wheezing or rales.  Lymphadenopathy:     Head:     Right side of head: No tonsillar adenopathy.     Left side of head: No tonsillar adenopathy.     Cervical: No cervical adenopathy.  Skin:    Findings: No erythema or rash.     Nails: There is no clubbing.  Neurological:     Mental Status: She is alert.     Diagnostics:    Spirometry was performed and demonstrated an FEV1 of 1.57 at 47 % of predicted.  Assessment and Plan:   1. Asthma, moderate persistent, well-controlled   2. Other allergic rhinitis   3. LPRD (laryngopharyngeal reflux disease)     1.  Continue Asmanex 200 - 2 inhalations 1 time a day with spacer  2.  Continue Spiriva 1.25 Respimat -2 inhalations 1 time per day  3. Continue montelukast 10mg  one tablet one time per day  4. Continue famotidine 20 mg 2 times per day   5. Continue OTC Nasacort one spray each 1 time per day during upper airway symptoms  6. Continue ProAir HFA or similar and antihistamine and nasal ipratropium if needed  7. Return to clinic in 6 months or earlier  if problem  Calleigh appears to be doing relatively well on her current plan which includes a collection of  anti-inflammatory agents for her airway and therapy directed against reflux.  She will continue on this plan and assuming she does well I will see her back in this clinic in 6 months or earlier if there is a problem.  Allena Katz, MD [facility] [place]

## 2020-06-21 ENCOUNTER — Other Ambulatory Visit: Payer: Self-pay

## 2020-06-21 ENCOUNTER — Ambulatory Visit: Payer: PPO | Admitting: Allergy and Immunology

## 2020-06-21 ENCOUNTER — Telehealth (INDEPENDENT_AMBULATORY_CARE_PROVIDER_SITE_OTHER): Payer: PPO | Admitting: Psychiatry

## 2020-06-21 DIAGNOSIS — F332 Major depressive disorder, recurrent severe without psychotic features: Secondary | ICD-10-CM | POA: Diagnosis not present

## 2020-06-21 DIAGNOSIS — F952 Tourette's disorder: Secondary | ICD-10-CM

## 2020-06-21 DIAGNOSIS — F411 Generalized anxiety disorder: Secondary | ICD-10-CM | POA: Diagnosis not present

## 2020-06-21 NOTE — Progress Notes (Signed)
Virtual Visit via Telephone Note  I connected with Nyisha Clippard on 06/21/20 at  1:30 PM EST by telephone and verified that I am speaking with the correct person using two identifiers.  Location: Patient: home Provider: office   I discussed the limitations, risks, security and privacy concerns of performing an evaluation and management service by telephone and the availability of in person appointments. I also discussed with the patient that there may be a patient responsible charge related to this service. The patient expressed understanding and agreed to proceed.   History of Present Illness: Gerarda started all her mornings back on 10/26 and all her night meds 4 days ago. She is starting to feel better. Donnette is noticing some small improvement that overall tell her she is doing better. They recently found out her brother has cancer. She tries not to think about it too much. Her anxiety ad depressant are not overwhelming. Liley has random periods of shaking in her hands and/or legs- she is not sure if it is due to weakness, medication side effect, Parkinson's or just random. She see's neurologist on 11/23. Her sleep has significantly improved since her cough has resolved. Babs denies SI/HI. She has not noticed any significant worsening of her twitches in her arms and stomach.   Observations/Objective:  General Appearance: unable to assess  Eye Contact:  unable to assess  Speech:  Clear and Coherent and Normal Rate  Volume:  Normal  Mood:  Depressed  Affect:  Full Range  Thought Process:  Goal Directed, Linear and Descriptions of Associations: Intact  Orientation:  Full (Time, Place, and Person)  Thought Content:  Logical  Suicidal Thoughts:  No  Homicidal Thoughts:  No  Memory:  Immediate;   Good  Judgement:  Good  Insight:  Good  Psychomotor Activity: unable to assess  Concentration:  Concentration: Good  Recall:  Good  Fund of Knowledge:  Good  Language:  Good  Akathisia:  unable to  assess  Handed:  Right  AIMS (if indicated):     Assets:  Communication Skills Desire for Improvement Financial Resources/Insurance Housing Leisure Time Resilience Social Support Talents/Skills Transportation Vocational/Educational  ADL's:  unable to assess  Cognition:  WNL  Sleep:         Assessment and Plan:  Major depressive disorder, recurrent, severe without psychotic features (HCC)  Tourette's disease  GAD (generalized anxiety disorder)    She has 2 bottles of Vistaril 10mg  po QID She has 1 bottle of Wellbutrin XL 300mg  po qD She has 1 bottle of Abilify 30mg  po qD  No refills today   Follow Up Instructions: In 3 months or sooner if needed   I discussed the assessment and treatment plan with the patient. The patient was provided an opportunity to ask questions and all were answered. The patient agreed with the plan and demonstrated an understanding of the instructions.   The patient was advised to call back or seek an in-person evaluation if the symptoms worsen or if the condition fails to improve as anticipated.  I provided 15 minutes of non-face-to-face time during this encounter.   Charlcie Cradle, MD

## 2020-06-25 ENCOUNTER — Encounter: Payer: Self-pay | Admitting: Allergy and Immunology

## 2020-06-29 DIAGNOSIS — I10 Essential (primary) hypertension: Secondary | ICD-10-CM | POA: Diagnosis not present

## 2020-06-29 DIAGNOSIS — J4540 Moderate persistent asthma, uncomplicated: Secondary | ICD-10-CM | POA: Diagnosis not present

## 2020-06-29 DIAGNOSIS — G4733 Obstructive sleep apnea (adult) (pediatric): Secondary | ICD-10-CM | POA: Diagnosis not present

## 2020-06-29 DIAGNOSIS — I493 Ventricular premature depolarization: Secondary | ICD-10-CM | POA: Diagnosis not present

## 2020-07-02 DIAGNOSIS — E041 Nontoxic single thyroid nodule: Secondary | ICD-10-CM | POA: Diagnosis not present

## 2020-07-02 DIAGNOSIS — E042 Nontoxic multinodular goiter: Secondary | ICD-10-CM | POA: Diagnosis not present

## 2020-07-03 ENCOUNTER — Ambulatory Visit: Payer: PPO | Admitting: Diagnostic Neuroimaging

## 2020-07-03 ENCOUNTER — Other Ambulatory Visit: Payer: Self-pay | Admitting: Allergy and Immunology

## 2020-07-13 ENCOUNTER — Other Ambulatory Visit: Payer: Self-pay | Admitting: Allergy and Immunology

## 2020-07-13 DIAGNOSIS — F332 Major depressive disorder, recurrent severe without psychotic features: Secondary | ICD-10-CM | POA: Diagnosis not present

## 2020-07-13 DIAGNOSIS — F411 Generalized anxiety disorder: Secondary | ICD-10-CM | POA: Diagnosis not present

## 2020-07-17 ENCOUNTER — Encounter: Payer: Self-pay | Admitting: Diagnostic Neuroimaging

## 2020-07-17 ENCOUNTER — Ambulatory Visit: Payer: PPO | Admitting: Diagnostic Neuroimaging

## 2020-07-17 ENCOUNTER — Other Ambulatory Visit: Payer: Self-pay

## 2020-07-17 VITALS — BP 144/87 | HR 80 | Ht 69.5 in | Wt 270.8 lb

## 2020-07-17 DIAGNOSIS — R251 Tremor, unspecified: Secondary | ICD-10-CM | POA: Diagnosis not present

## 2020-07-17 DIAGNOSIS — G20 Parkinson's disease: Secondary | ICD-10-CM

## 2020-07-17 NOTE — Progress Notes (Signed)
[facility]  PATIENT: Penny Porter DOB: Jul 01, 1966  REFERRING CLINICIAN: Raina Mina., MD  HISTORY FROM: patient REASON FOR VISIT: follow up   HISTORICAL  CHIEF COMPLAINT:  Chief Complaint  Patient presents with  . Tremors    rm 6, friend- [name], FU req by Dr Doyne Keel, is Abilify causing tremors?"    HISTORY OF PRESENT ILLNESS:   UPDATE (07/17/20, VRP): Since last visit, doing about the same. Intermittent tremor at home in hands. Today no tremor. Abilify helping with patients tourettes and depression. Psych asking if abilify is causing tremors.  UPDATE (05/31/19, VRP): Since last visit, doing about the same. Postural tremor in left hand continues. Carb/levo 1 tab twice a day --> not much benefit. Symptoms are stable. Severity is moderate. No alleviating or aggravating factors.    PRIOR HPI (01/25/19): 54 year old female here for evaluation of tremors.  Patient reports approximately 2 months of intermittent postural tremors in her left hand when she holds objects.  She has also noticed that her fingers and hands tend to "lock up" at random times.  Patient has significant psychiatry history including anxiety, depression, Tourette's syndrome.  Patient has been on risperidone and Abilify.  She had developed some tremors in her legs and was prescribed Cogentin in the past but stopped this more recently.  Patient has had some dizziness and vertigo but these have resolved.   REVIEW OF SYSTEMS: Full 14 system review of systems performed and negative with exception of: as per HPI.   ALLERGIES: Allergies  Allergen Reactions  . Amoxicillin Hives and Swelling    LIP SWELLING PATIENT HAS HAD A PCN REACTION WITH IMMEDIATE RASH, FACIAL/TONGUE/THROAT SWELLING, SOB, OR LIGHTHEADEDNESS WITH HYPOTENSION:  #  #  #  YES  #  #  #   HAS PT DEVELOPED SEVERE RASH INVOLVING MUCUS MEMBRANES or SKIN NECROSIS: #  #  #  YES  #  #  #  Has patient had a PCN reaction that required  hospitalization: No Has patient had a PCN reaction occurring within the last 10 years: Unknown If all of the above answers are "NO", then may proceed with Cephalosporin use.   . Avelox [Moxifloxacin Hcl In Nacl] Other (See Comments)    Caused pain in shoulders to finger, tendonitis  . Adhesive [Tape] Dermatitis    Blisters skin  . Bextra [Valdecoxib] Hives and Swelling  . Doxycycline Other (See Comments)  . Doxycycline Hyclate Hives and Swelling  . Gadobutrol Hives  . Gadolinium Derivatives Hives  . Gadoversetamide Hives  . Iodinated Diagnostic Agents Hives  . Lamictal [Lamotrigine] Hives  . Naproxen Hypertension  . Oxycodone-Acetaminophen Other (See Comments)  . Percolone [Oxycodone] Hives  . Relafen [Nabumetone] Hypertension  . Risperdal [Risperidone] Other (See Comments)    Leg weakness   . Nitrofurantoin Other (See Comments)  . Brintellix [Vortioxetine] Nausea And Vomiting  . Fetzima [Levomilnacipran] Other (See Comments)    Tremors across head   . Moxifloxacin Other (See Comments)    Caused pain in shoulders to finger, tendonitis  . Sulfamethoxazole-Trimethoprim Rash    Flushing and rash    HOME MEDICATIONS: Outpatient Medications Prior to Visit  Medication Sig Dispense Refill  . Acetaminophen (TYLENOL PO) Take 1 tablet by mouth daily as needed (PAIN).    Marland Kitchen albuterol (PROVENTIL) (2.5 MG/3ML) 0.083% nebulizer solution Can use one vial in nebulizer every four to six hours as needed for cough or wheeze. 180 mL 1  . Albuterol Sulfate (PROAIR RESPICLICK) 415 (90  Base) MCG/ACT AEPB Can inhale two doses every four to six hours as needed for cough or wheeze. 3 each 0  . allopurinol (ZYLOPRIM) 100 MG tablet Take by mouth 2 (two) times daily.     . ARIPiprazole (ABILIFY) 30 MG tablet TAKE 1 TABLET BY MOUTH EVERY DAY 90 tablet 0  . ASMANEX HFA 200 MCG/ACT AERO INHALE 2 PUFFS INTO THE LUNGS DAILY. RINSE, GARGLE, AND SPIT AFTER USE. 13 g 5  . aspirin 81 MG tablet Take 81 mg by  mouth daily.    Marland Kitchen azithromycin (ZITHROMAX) 250 MG tablet TAKE ONE TABLET BY MOUTH 3 TIMES WEEKLY 12 tablet 5  . buPROPion (WELLBUTRIN XL) 300 MG 24 hr tablet TAKE 1 TABLET BY MOUTH EVERY DAY IN THE MORNING 90 tablet 0  . Cholecalciferol (VITAMIN D-3) 5000 UNITS TABS Take 5,000 Units by mouth daily.     . famotidine (PEPCID) 20 MG tablet Take 20 mg by mouth 2 (two) times daily.    . flecainide (TAMBOCOR) 50 MG tablet SMARTSIG:1 Tablet(s) By Mouth Every 12 Hours    . furosemide (LASIX) 20 MG tablet Take 20 mg by mouth daily.    . hydrOXYzine (ATARAX/VISTARIL) 10 MG tablet Take 1 tablet (10 mg total) by mouth 3 (three) times daily as needed for anxiety. 270 tablet 0  . ipratropium (ATROVENT) 0.03 % nasal spray USE 2 SPRAYS IN NOSTRIL (S) 2 TIMES DAILY AS DIRECTED  11  . ketoconazole (NIZORAL) 2 % shampoo Apply 1 application topically as needed for irritation.     Marland Kitchen loratadine (CLARITIN) 10 MG tablet Take 10 mg by mouth at bedtime.     Marland Kitchen lubiprostone (AMITIZA) 24 MCG capsule Take 24 mcg by mouth daily as needed for constipation.     . meclizine (ANTIVERT) 25 MG tablet PLEASE SEE ATTACHED FOR DETAILED DIRECTIONS    . meloxicam (MOBIC) 7.5 MG tablet Take 7.5 mg by mouth 2 (two) times daily.  0  . methocarbamol (ROBAXIN) 500 MG tablet Take 500 mg by mouth 4 (four) times daily.    . metoprolol succinate (TOPROL-XL) 25 MG 24 hr tablet Take by mouth 3 (three) times daily.     . montelukast (SINGULAIR) 10 MG tablet Take 10 mg by mouth daily.  5  . mupirocin ointment (BACTROBAN) 2 % Place 1 application into the nose 2 (two) times daily as needed (IRRITATION).     Marland Kitchen ondansetron (ZOFRAN-ODT) 4 MG disintegrating tablet Take 4 mg by mouth 2 (two) times daily as needed for nausea or vomiting.    . pregabalin (LYRICA) 75 MG capsule Take 75 mg by mouth 2 (two) times daily. Reported on 10/09/2015    . spironolactone (ALDACTONE) 25 MG tablet Take 25 mg by mouth daily.    . Tiotropium Bromide Monohydrate (SPIRIVA  RESPIMAT) 1.25 MCG/ACT AERS Inhale two puffs once daily as directed. 4 g 1  . traMADol (ULTRAM) 50 MG tablet Take 50 mg by mouth.    . triamcinolone cream (KENALOG) 0.1 % Apply 1 application topically 2 (two) times daily as needed (IRRITATION).     Marland Kitchen triamcinolone ointment (KENALOG) 0.5 % Apply 1 application topically 2 (two) times daily as needed (IRRITATION).     Marland Kitchen vitamin B-12 (CYANOCOBALAMIN) 1000 MCG tablet Take 1,000 mcg by mouth daily.     No facility-administered medications prior to visit.    PAST MEDICAL HISTORY: Past Medical History:  Diagnosis Date  . Anxiety   . Arthritis    Back and hip  .  Asthma   . Bursitis   . Complication of anesthesia    bladder hard to wake up, never needed a in and out cath  . Costochondral chest pain   . Degenerative disk disease   . Depression   . Gastroparesis   . GERD (gastroesophageal reflux disease)   . Hypertension   . Lumbar herniated disc   . Premature atrial beats 12/2019  . Sleep apnea    not using CPAP  . Stenosis of lumbosacral spine   . Stress fracture of foot   . Thyroid nodule   . Tourette disease   . Trigeminal nerve disease     PAST SURGICAL HISTORY: Past Surgical History:  Procedure Laterality Date  . BACK SURGERY  September 19, 2015  . bone spur removal on back    . CHOLECYSTECTOMY  2005  . COLONOSCOPY    . ELBOW SURGERY Left May 29, 2014  . ESOPHAGOGASTRODUODENOSCOPY    . KNEE SURGERY  06/2016  . polyp removal    . POSTERIOR FUSION LUMBAR SPINE  06/26/2017  . ruptured disk  2008  . TEAR DUCT PROBING  2009    FAMILY HISTORY: Family History  Problem Relation Age of Onset  . Mental retardation Sister   . Dementia Father   . Cancer Mother   . Cancer Brother        colorectal  . Suicidality Neg Hx     SOCIAL HISTORY: Social History   Socioeconomic History  . Marital status: Single    Spouse name: Not on file  . Number of children: Not on file  . Years of education: Not on file  . Highest  education level: High school graduate  Occupational History    Comment: disabled   Tobacco Use  . Smoking status: Never Smoker  . Smokeless tobacco: Never Used  Vaping Use  . Vaping Use: Never used  Substance and Sexual Activity  . Alcohol use: No  . Drug use: No  . Sexual activity: Not Currently  Other Topics Concern  . Not on file  Social History Narrative   Lives with 2 sisters   Caffeine- none   Social Determinants of Health   Financial Resource Strain:   . Difficulty of Paying Living Expenses: Not on file  Food Insecurity:   . Worried About Charity fundraiser in the Last Year: Not on file  . Ran Out of Food in the Last Year: Not on file  Transportation Needs:   . Lack of Transportation (Medical): Not on file  . Lack of Transportation (Non-Medical): Not on file  Physical Activity:   . Days of Exercise per Week: Not on file  . Minutes of Exercise per Session: Not on file  Stress:   . Feeling of Stress : Not on file  Social Connections:   . Frequency of Communication with Friends and Family: Not on file  . Frequency of Social Gatherings with Friends and Family: Not on file  . Attends Religious Services: Not on file  . Active Member of Clubs or Organizations: Not on file  . Attends Archivist Meetings: Not on file  . Marital Status: Not on file  Intimate Partner Violence:   . Fear of Current or Ex-Partner: Not on file  . Emotionally Abused: Not on file  . Physically Abused: Not on file  . Sexually Abused: Not on file     PHYSICAL EXAM  GENERAL EXAM/CONSTITUTIONAL: Vitals:  Vitals:   07/17/20 0920  BP: Marland Kitchen)  144/87  Pulse: 80  Weight: 270 lb 12.8 oz (122.8 kg)  Height: 5' 9.5" (1.765 m)   Body mass index is 39.42 kg/m. Wt Readings from Last 3 Encounters:  07/17/20 270 lb 12.8 oz (122.8 kg)  05/31/19 271 lb 9.6 oz (123.2 kg)  01/25/19 272 lb (123.4 kg)    Patient is in no distress; well developed, nourished and groomed; neck is  supple  CARDIOVASCULAR:  Examination of carotid arteries is normal; no carotid bruits  Regular rate and rhythm, no murmurs  Examination of peripheral vascular system by observation and palpation is normal  EYES:  Ophthalmoscopic exam of optic discs and posterior segments is normal; no papilledema or hemorrhages No exam data present  MUSCULOSKELETAL:  Gait, strength, tone, movements noted in Neurologic exam below  NEUROLOGIC: MENTAL STATUS:  No flowsheet data found.  awake, alert, oriented to person, place and time  recent and remote memory intact  normal attention and concentration  language fluent, comprehension intact, naming intact  fund of knowledge appropriate  CRANIAL NERVE:   2nd - no papilledema on fundoscopic exam  2nd, 3rd, 4th, 6th - pupils equal and reactive to light, visual fields full to confrontation, extraocular muscles intact, no nystagmus  5th - facial sensation symmetric  7th - facial strength symmetric  8th - hearing intact  9th - palate elevates symmetrically, uvula midline  11th - shoulder shrug symmetric  12th - tongue protrusion midline  MASKED FACIES  MOTOR:   NO TREMOR  MILD RIGIDITY IN BUE; MILD BRADYKINESIA IN BUE  full strength in the BUE, BLE  SENSORY:   normal and symmetric to light touch; DECR VIB AT TOES  COORDINATION:   finger-nose-finger, fine finger movements --> SLOW  REFLEXES:   deep tendon reflexes TRACE and symmetric  GAIT/STATION:   USES CANE; DECR ARM SWING; SHORT STEPS    DIAGNOSTIC DATA (LABS, IMAGING, TESTING) - I reviewed patient records, labs, notes, testing and imaging myself where available.  Lab Results  Component Value Date   WBC 9.0 06/18/2017   HGB 14.6 06/18/2017   HCT 43.2 06/18/2017   MCV 94.3 06/18/2017   PLT 212 06/18/2017      Component Value Date/Time   NA 138 06/18/2017 1051   K 3.9 06/18/2017 1051   CL 106 06/18/2017 1051   CO2 27 06/18/2017 1051   GLUCOSE 76  06/18/2017 1051   BUN 14 06/18/2017 1051   CREATININE 0.97 06/18/2017 1051   CALCIUM 9.0 06/18/2017 1051   GFRNONAA >60 06/18/2017 1051   GFRAA >60 06/18/2017 1051   No results found for: CHOL, HDL, LDLCALC, LDLDIRECT, TRIG, CHOLHDL No results found for: HGBA1C No results found for: VITAMINB12 No results found for: TSH   02/22/19 MRI brain - normal    ASSESSMENT AND PLAN  54 y.o. year old female here with mild postural tremor in left upper extremity, mild rigidity and bradykinesia.  Could represent essential tremor versus parkinsonism (drug-induced versus idiopathic). Trial of carb/levo not that helpful.  Dx:  1. Tremor   2. Parkinsonism, unspecified Parkinsonism type ([place])      PLAN:  PARKINSONISM (idiopathic vs drug induced: abilify) - check DATscan (eval for idiopathic parkinson's disease vs medication induced parkinsonism)  Orders Placed This Encounter  Procedures  . NM BRAIN DATSCAN TUMOR LOC INFLAM SPECT 1 DAY   Return for pending if symptoms worsen or fail to improve.    Penni Bombard, MD 62/04/5283, 1:32 AM Certified in Neurology, Neurophysiology and Neuroimaging  [facility] [address] [place],  [address] [phone]

## 2020-07-17 NOTE — Patient Instructions (Signed)
PARKINSONISM (idiopathic vs drug induced: abilify) - check DATscan (eval for idiopathic parkinson's disease vs medication induced parkinsonism)

## 2020-07-21 ENCOUNTER — Other Ambulatory Visit (HOSPITAL_COMMUNITY): Payer: Self-pay | Admitting: Psychiatry

## 2020-07-21 DIAGNOSIS — F332 Major depressive disorder, recurrent severe without psychotic features: Secondary | ICD-10-CM

## 2020-07-23 ENCOUNTER — Telehealth: Payer: Self-pay | Admitting: Diagnostic Neuroimaging

## 2020-07-23 NOTE — Telephone Encounter (Signed)
Called and spoke to patient I will mail Her Consent For DAT scan . I have to that before I can process. When I get consent back I will get approved , set up through [facility] . Patient aware of all details . Thanks Hinton Dyer

## 2020-07-31 DIAGNOSIS — Z1231 Encounter for screening mammogram for malignant neoplasm of breast: Secondary | ICD-10-CM | POA: Diagnosis not present

## 2020-07-31 NOTE — Telephone Encounter (Signed)
Call Penny Porter on Thursday about DAT scan.

## 2020-08-01 DIAGNOSIS — M1712 Unilateral primary osteoarthritis, left knee: Secondary | ICD-10-CM | POA: Diagnosis not present

## 2020-08-06 DIAGNOSIS — Z01419 Encounter for gynecological examination (general) (routine) without abnormal findings: Secondary | ICD-10-CM | POA: Diagnosis not present

## 2020-08-06 DIAGNOSIS — Z1231 Encounter for screening mammogram for malignant neoplasm of breast: Secondary | ICD-10-CM | POA: Diagnosis not present

## 2020-08-06 DIAGNOSIS — N950 Postmenopausal bleeding: Secondary | ICD-10-CM | POA: Diagnosis not present

## 2020-08-08 ENCOUNTER — Other Ambulatory Visit: Payer: Self-pay | Admitting: Allergy and Immunology

## 2020-08-15 DIAGNOSIS — F332 Major depressive disorder, recurrent severe without psychotic features: Secondary | ICD-10-CM | POA: Diagnosis not present

## 2020-08-15 DIAGNOSIS — Z23 Encounter for immunization: Secondary | ICD-10-CM | POA: Diagnosis not present

## 2020-08-15 DIAGNOSIS — L659 Nonscarring hair loss, unspecified: Secondary | ICD-10-CM | POA: Diagnosis not present

## 2020-08-15 DIAGNOSIS — I10 Essential (primary) hypertension: Secondary | ICD-10-CM | POA: Diagnosis not present

## 2020-08-15 DIAGNOSIS — E042 Nontoxic multinodular goiter: Secondary | ICD-10-CM | POA: Diagnosis not present

## 2020-08-16 ENCOUNTER — Other Ambulatory Visit (HOSPITAL_COMMUNITY): Payer: Self-pay | Admitting: Psychiatry

## 2020-08-16 DIAGNOSIS — F411 Generalized anxiety disorder: Secondary | ICD-10-CM

## 2020-08-24 DIAGNOSIS — L608 Other nail disorders: Secondary | ICD-10-CM | POA: Diagnosis not present

## 2020-08-24 DIAGNOSIS — L602 Onychogryphosis: Secondary | ICD-10-CM | POA: Diagnosis not present

## 2020-08-24 DIAGNOSIS — L638 Other alopecia areata: Secondary | ICD-10-CM | POA: Diagnosis not present

## 2020-08-24 DIAGNOSIS — L219 Seborrheic dermatitis, unspecified: Secondary | ICD-10-CM | POA: Diagnosis not present

## 2020-08-25 ENCOUNTER — Other Ambulatory Visit (HOSPITAL_COMMUNITY): Payer: Self-pay | Admitting: Psychiatry

## 2020-08-25 DIAGNOSIS — F332 Major depressive disorder, recurrent severe without psychotic features: Secondary | ICD-10-CM

## 2020-08-25 DIAGNOSIS — F952 Tourette's disorder: Secondary | ICD-10-CM

## 2020-08-29 ENCOUNTER — Other Ambulatory Visit: Payer: Self-pay | Admitting: Allergy and Immunology

## 2020-08-31 NOTE — Telephone Encounter (Signed)
Patient returned DAT scan consent . I will send for Dr.Penumalli referral form and I will start authorization process for [facility] . Thanks Hinton Dyer

## 2020-09-04 NOTE — Telephone Encounter (Signed)
Done. -VRP 

## 2020-09-04 NOTE — Telephone Encounter (Signed)
Patient is aware I will call her when I get insurance  approval back and you will call with results. DAT scan pending . Thanks Hinton Dyer

## 2020-09-17 DIAGNOSIS — I129 Hypertensive chronic kidney disease with stage 1 through stage 4 chronic kidney disease, or unspecified chronic kidney disease: Secondary | ICD-10-CM | POA: Diagnosis not present

## 2020-09-17 DIAGNOSIS — F419 Anxiety disorder, unspecified: Secondary | ICD-10-CM | POA: Diagnosis not present

## 2020-09-17 DIAGNOSIS — N183 Chronic kidney disease, stage 3 unspecified: Secondary | ICD-10-CM | POA: Diagnosis not present

## 2020-09-21 DIAGNOSIS — E042 Nontoxic multinodular goiter: Secondary | ICD-10-CM | POA: Diagnosis not present

## 2020-09-21 DIAGNOSIS — R030 Elevated blood-pressure reading, without diagnosis of hypertension: Secondary | ICD-10-CM | POA: Diagnosis not present

## 2020-09-21 DIAGNOSIS — H538 Other visual disturbances: Secondary | ICD-10-CM | POA: Diagnosis not present

## 2020-09-21 DIAGNOSIS — R42 Dizziness and giddiness: Secondary | ICD-10-CM | POA: Diagnosis not present

## 2020-09-21 DIAGNOSIS — R519 Headache, unspecified: Secondary | ICD-10-CM | POA: Diagnosis not present

## 2020-09-24 ENCOUNTER — Other Ambulatory Visit: Payer: Self-pay | Admitting: Allergy and Immunology

## 2020-09-24 ENCOUNTER — Other Ambulatory Visit (HOSPITAL_COMMUNITY): Payer: Self-pay | Admitting: Psychiatry

## 2020-09-24 DIAGNOSIS — F332 Major depressive disorder, recurrent severe without psychotic features: Secondary | ICD-10-CM

## 2020-09-24 DIAGNOSIS — F952 Tourette's disorder: Secondary | ICD-10-CM

## 2020-09-24 DIAGNOSIS — F411 Generalized anxiety disorder: Secondary | ICD-10-CM

## 2020-09-25 DIAGNOSIS — H53143 Visual discomfort, bilateral: Secondary | ICD-10-CM | POA: Diagnosis not present

## 2020-09-25 DIAGNOSIS — H5713 Ocular pain, bilateral: Secondary | ICD-10-CM | POA: Diagnosis not present

## 2020-09-25 DIAGNOSIS — H04123 Dry eye syndrome of bilateral lacrimal glands: Secondary | ICD-10-CM | POA: Diagnosis not present

## 2020-09-25 DIAGNOSIS — H02831 Dermatochalasis of right upper eyelid: Secondary | ICD-10-CM | POA: Diagnosis not present

## 2020-09-26 DIAGNOSIS — M1712 Unilateral primary osteoarthritis, left knee: Secondary | ICD-10-CM | POA: Diagnosis not present

## 2020-09-27 ENCOUNTER — Encounter (HOSPITAL_COMMUNITY)
Admission: RE | Admit: 2020-09-27 | Discharge: 2020-09-27 | Disposition: A | Discharge status: Home / Self Care | Payer: PPO | Source: Ambulatory Visit | Attending: Diagnostic Neuroimaging | Admitting: Diagnostic Neuroimaging

## 2020-09-27 ENCOUNTER — Encounter (HOSPITAL_COMMUNITY): Payer: PPO

## 2020-09-27 ENCOUNTER — Other Ambulatory Visit: Payer: Self-pay

## 2020-09-27 ENCOUNTER — Encounter (HOSPITAL_COMMUNITY): Payer: Self-pay

## 2020-09-27 DIAGNOSIS — R251 Tremor, unspecified: Secondary | ICD-10-CM | POA: Insufficient documentation

## 2020-09-27 DIAGNOSIS — G20 Parkinson's disease: Secondary | ICD-10-CM | POA: Insufficient documentation

## 2020-10-03 DIAGNOSIS — M1712 Unilateral primary osteoarthritis, left knee: Secondary | ICD-10-CM | POA: Diagnosis not present

## 2020-10-03 DIAGNOSIS — F332 Major depressive disorder, recurrent severe without psychotic features: Secondary | ICD-10-CM | POA: Diagnosis not present

## 2020-10-03 DIAGNOSIS — F411 Generalized anxiety disorder: Secondary | ICD-10-CM | POA: Diagnosis not present

## 2020-10-04 ENCOUNTER — Telehealth (INDEPENDENT_AMBULATORY_CARE_PROVIDER_SITE_OTHER): Payer: PPO | Admitting: Psychiatry

## 2020-10-04 ENCOUNTER — Other Ambulatory Visit: Payer: Self-pay

## 2020-10-04 DIAGNOSIS — F332 Major depressive disorder, recurrent severe without psychotic features: Secondary | ICD-10-CM

## 2020-10-04 DIAGNOSIS — F952 Tourette's disorder: Secondary | ICD-10-CM

## 2020-10-04 DIAGNOSIS — F411 Generalized anxiety disorder: Secondary | ICD-10-CM

## 2020-10-04 MED ORDER — HYDROXYZINE HCL 10 MG PO TABS
10.0000 mg | ORAL_TABLET | Freq: Three times a day (TID) | ORAL | 0 refills | Status: DC | PRN
Start: 2020-10-04 — End: 2020-10-25

## 2020-10-04 MED ORDER — BUPROPION HCL ER (XL) 300 MG PO TB24
ORAL_TABLET | ORAL | 0 refills | Status: DC
Start: 2020-10-04 — End: 2020-10-25

## 2020-10-04 MED ORDER — ARIPIPRAZOLE 30 MG PO TABS: 30.0000 mg | ORAL_TABLET | Freq: Every day | ORAL | 0 refills | Status: DC

## 2020-10-04 NOTE — Progress Notes (Signed)
Virtual Visit via Telephone Note  I connected with Penny Porter on 10/04/20 at  9:30 AM EST by telephone and verified that I am speaking with the correct person using two identifiers.  Location: Patient: home Provider: office   I discussed the limitations, risks, security and privacy concerns of performing an evaluation and management service by telephone and the availability of in person appointments. I also discussed with the patient that there may be a patient responsible charge related to this service. The patient expressed understanding and agreed to proceed.   History of Present Illness: Penny Porter shares that her health remains poor. Her BP is up and her pain unchanged. Health contributes to her onoging depression and anxiety. She is working with her other doctors. Penny Porter tries to do things to keep busy. She is denying isolation and anheonia. She denies SI/HI.   Observations/Objective:  General Appearance: unable to assess  Eye Contact:  unable to assess  Speech:  Clear and Coherent and Normal Rate  Volume:  Normal  Mood:  Anxious and Depressed  Affect:  Congruent  Thought Process:  Goal Directed, Linear and Descriptions of Associations: Intact  Orientation:  Full (Time, Place, and Person)  Thought Content:  Logical  Suicidal Thoughts:  No  Homicidal Thoughts:  No  Memory:  Immediate;   Good  Judgement:  Good  Insight:  Good  Psychomotor Activity: unable to assess  Concentration:  Concentration: Good  Recall:  Good  Fund of Knowledge:  Good  Language:  Good  Akathisia:  unable to assess  Handed:  Right  AIMS (if indicated):     Assets:  Communication Skills Desire for Improvement Financial Resources/Insurance Housing Social Support Talents/Skills Transportation Vocational/Educational  ADL's:  unable to assess  Cognition:  WNL  Sleep:         Assessment and Plan: 1. Major depressive disorder, recurrent, severe without psychotic features ([place]) - ARIPiprazole  (ABILIFY) 30 MG tablet; Take 1 tablet (30 mg total) by mouth daily.  Dispense: 90 tablet; Refill: 0 - buPROPion (WELLBUTRIN XL) 300 MG 24 hr tablet; TAKE 1 TABLET BY MOUTH EVERY DAY IN THE MORNING  Dispense: 90 tablet; Refill: 0  2. Tourette's disease - ARIPiprazole (ABILIFY) 30 MG tablet; Take 1 tablet (30 mg total) by mouth daily.  Dispense: 90 tablet; Refill: 0  3. GAD (generalized anxiety disorder) - hydrOXYzine (ATARAX/VISTARIL) 10 MG tablet; Take 1 tablet (10 mg total) by mouth 3 (three) times daily as needed for anxiety.  Dispense: 270 tablet; Refill: 0  - Penny Porter does not want meds to be changed at this time.   Follow Up Instructions: In 2-3 months or sooner if needed   I discussed the assessment and treatment plan with the patient. The patient was provided an opportunity to ask questions and all were answered. The patient agreed with the plan and demonstrated an understanding of the instructions.   The patient was advised to call back or seek an in-person evaluation if the symptoms worsen or if the condition fails to improve as anticipated.  I provided 15 minutes of non-face-to-face time during this encounter.   Charlcie Cradle, MD

## 2020-10-10 ENCOUNTER — Telehealth (HOSPITAL_COMMUNITY): Payer: Self-pay | Admitting: *Deleted

## 2020-10-10 DIAGNOSIS — I878 Other specified disorders of veins: Secondary | ICD-10-CM | POA: Diagnosis not present

## 2020-10-10 DIAGNOSIS — R5383 Other fatigue: Secondary | ICD-10-CM | POA: Diagnosis not present

## 2020-10-10 DIAGNOSIS — F419 Anxiety disorder, unspecified: Secondary | ICD-10-CM | POA: Diagnosis not present

## 2020-10-10 DIAGNOSIS — M79672 Pain in left foot: Secondary | ICD-10-CM | POA: Diagnosis not present

## 2020-10-10 DIAGNOSIS — I129 Hypertensive chronic kidney disease with stage 1 through stage 4 chronic kidney disease, or unspecified chronic kidney disease: Secondary | ICD-10-CM | POA: Diagnosis not present

## 2020-10-10 DIAGNOSIS — I10 Essential (primary) hypertension: Secondary | ICD-10-CM | POA: Diagnosis not present

## 2020-10-10 DIAGNOSIS — G894 Chronic pain syndrome: Secondary | ICD-10-CM | POA: Diagnosis not present

## 2020-10-10 DIAGNOSIS — N1831 Chronic kidney disease, stage 3a: Secondary | ICD-10-CM | POA: Diagnosis not present

## 2020-10-10 DIAGNOSIS — R5381 Other malaise: Secondary | ICD-10-CM | POA: Diagnosis not present

## 2020-10-10 DIAGNOSIS — F332 Major depressive disorder, recurrent severe without psychotic features: Secondary | ICD-10-CM | POA: Diagnosis not present

## 2020-10-10 DIAGNOSIS — M1712 Unilateral primary osteoarthritis, left knee: Secondary | ICD-10-CM | POA: Diagnosis not present

## 2020-10-10 NOTE — Telephone Encounter (Signed)
Pt called stating that she recently saw her PCP, Dr. Bea Graff, who has concerns about pt increased bp (144/87 yesterday)  which Dr thinks may be r/t the Wellbutrin and would like to d/c it. Pt states you can call either her or PCP for more information if needed.

## 2020-10-11 NOTE — Telephone Encounter (Signed)
Penny Porter has been very resistent to making any med changes. If she would like to discuss other med options then schedule her an appointment for next week. If she does not want Wellbutrin changed than she will need to make BP med adjustments.

## 2020-10-15 DIAGNOSIS — M6702 Short Achilles tendon (acquired), left ankle: Secondary | ICD-10-CM | POA: Diagnosis not present

## 2020-10-15 DIAGNOSIS — M7672 Peroneal tendinitis, left leg: Secondary | ICD-10-CM | POA: Diagnosis not present

## 2020-10-15 DIAGNOSIS — M79672 Pain in left foot: Secondary | ICD-10-CM | POA: Diagnosis not present

## 2020-10-22 ENCOUNTER — Other Ambulatory Visit: Payer: Self-pay | Admitting: Allergy and Immunology

## 2020-10-23 DIAGNOSIS — H04123 Dry eye syndrome of bilateral lacrimal glands: Secondary | ICD-10-CM | POA: Diagnosis not present

## 2020-10-23 DIAGNOSIS — H02831 Dermatochalasis of right upper eyelid: Secondary | ICD-10-CM | POA: Diagnosis not present

## 2020-10-23 DIAGNOSIS — G894 Chronic pain syndrome: Secondary | ICD-10-CM | POA: Diagnosis not present

## 2020-10-23 DIAGNOSIS — H524 Presbyopia: Secondary | ICD-10-CM | POA: Diagnosis not present

## 2020-10-23 DIAGNOSIS — M79672 Pain in left foot: Secondary | ICD-10-CM | POA: Diagnosis not present

## 2020-10-23 DIAGNOSIS — H02834 Dermatochalasis of left upper eyelid: Secondary | ICD-10-CM | POA: Diagnosis not present

## 2020-10-25 ENCOUNTER — Other Ambulatory Visit: Payer: Self-pay

## 2020-10-25 ENCOUNTER — Telehealth (INDEPENDENT_AMBULATORY_CARE_PROVIDER_SITE_OTHER): Payer: PPO | Admitting: Psychiatry

## 2020-10-25 DIAGNOSIS — F332 Major depressive disorder, recurrent severe without psychotic features: Secondary | ICD-10-CM | POA: Diagnosis not present

## 2020-10-25 DIAGNOSIS — F411 Generalized anxiety disorder: Secondary | ICD-10-CM | POA: Diagnosis not present

## 2020-10-25 DIAGNOSIS — F952 Tourette's disorder: Secondary | ICD-10-CM

## 2020-10-25 MED ORDER — BUPROPION HCL ER (XL) 300 MG PO TB24: ORAL_TABLET | ORAL | 0 refills | Status: DC

## 2020-10-25 MED ORDER — ARIPIPRAZOLE 30 MG PO TABS: 30.0000 mg | ORAL_TABLET | Freq: Every day | ORAL | 0 refills | Status: DC

## 2020-10-25 MED ORDER — HYDROXYZINE HCL 10 MG PO TABS: 10.0000 mg | ORAL_TABLET | Freq: Three times a day (TID) | ORAL | 0 refills | Status: DC | PRN

## 2020-10-25 NOTE — Progress Notes (Signed)
Virtual Visit via Telephone Note  I connected with Penny Porter on 10/25/20 at 11:00 AM EDT by telephone and verified that I am speaking with the correct person using two identifiers.  Location: Patient: home Provider: office   I discussed the limitations, risks, security and privacy concerns of performing an evaluation and management service by telephone and the availability of in person appointments. I also discussed with the patient that there may be a patient responsible charge related to this service. The patient expressed understanding and agreed to proceed.   History of Present Illness: Penny Porter shares that her PCP is concerned that the Wellbutrin or Mobic is raising her BP. She had to stop the Wellbutrin at the end of Feb due to an upcoming procedure. She is checking her BP multiple times a day at home. Her BP spikes when she walks any. She notes that her BP is variable even off the Wellbutrin. Her PCP referred her to cardiology and her appointment on is on 11/21/2020. Her depression is unchanged. She is always tired and has low motivation to do things due to her medical problems. Her sleep is a little better. She is back to sleeping thru most of the night. Her appetite is fair. Leshae denies SI/HI. Her anxiety is mild and manageable. She does not experience it daily. Her thinks her Tourettes is well controlled. She does note tremors in both hands most days.   Observations/Objective:  General Appearance: unable to assess  Eye Contact:  unable to assess  Speech:  Clear and Coherent and Normal Rate  Volume:  Normal  Mood:  Depressed  Affect:  Congruent  Thought Process:  Goal Directed, Linear and Descriptions of Associations: Intact  Orientation:  Full (Time, Place, and Person)  Thought Content:  Logical  Suicidal Thoughts:  No  Homicidal Thoughts:  No  Memory:  Immediate;   Good  Judgement:  Good  Insight:  Good  Psychomotor Activity: unable to assess  Concentration:  Concentration: Good   Recall:  Good  Fund of Knowledge:  Good  Language:  Good  Akathisia:  unable to assess  Handed:  Right  AIMS (if indicated):     Assets:  Communication Skills Desire for Improvement Financial Resources/Insurance Housing Resilience Social Support Talents/Skills Transportation Vocational/Educational  ADL's:  unable to assess  Cognition:  WNL  Sleep:         Assessment and Plan: 1. Major depressive disorder, recurrent, severe without psychotic features ([place]) - ARIPiprazole (ABILIFY) 30 MG tablet; Take 1 tablet (30 mg total) by mouth daily.  Dispense: 90 tablet; Refill: 0 - buPROPion (WELLBUTRIN XL) 300 MG 24 hr tablet; TAKE 1 TABLET BY MOUTH EVERY DAY IN THE MORNING  Dispense: 90 tablet; Refill: 0  2. Tourette's disease - ARIPiprazole (ABILIFY) 30 MG tablet; Take 1 tablet (30 mg total) by mouth daily.  Dispense: 90 tablet; Refill: 0  3. GAD (generalized anxiety disorder) - hydrOXYzine (ATARAX/VISTARIL) 10 MG tablet; Take 1 tablet (10 mg total) by mouth 3 (three) times daily as needed for anxiety.  Dispense: 270 tablet; Refill: 0   -advised to restart Wellbutrin after the procedure and continue to log the BP. She is seeing a cardiologist 11/21/20  Depression screen [facility] 2/9 10/25/2020 10/04/2020 08/30/2018 06/02/2017 03/10/2017  Decreased Interest 1 0 0 0 0  Down, Depressed, Hopeless 1 2 0 0 0  PHQ - 2 Score 2 2 0 0 0  Altered sleeping 0 1 - - -  Tired, decreased energy 3 1 - - -  Change in appetite 0 0 - - -  Feeling bad or failure about yourself  3 2 - - -  Trouble concentrating 0 1 - - -  Moving slowly or fidgety/restless 0 2 - - -  Suicidal thoughts 0 0 - - -  PHQ-9 Score 8 9 - - -    Flowsheet Row Video Visit from 10/25/2020 in [facility] Video Visit from 10/04/2020 in [facility]  C-SSRS RISK CATEGORY No Risk No Risk       Follow Up Instructions: In 3 months or sooner if needed   I  discussed the assessment and treatment plan with the patient. The patient was provided an opportunity to ask questions and all were answered. The patient agreed with the plan and demonstrated an understanding of the instructions.   The patient was advised to call back or seek an in-person evaluation if the symptoms worsen or if the condition fails to improve as anticipated.  I provided 20 minutes of non-face-to-face time during this encounter.   Charlcie Cradle, MD

## 2020-10-29 DIAGNOSIS — F332 Major depressive disorder, recurrent severe without psychotic features: Secondary | ICD-10-CM | POA: Diagnosis not present

## 2020-10-29 DIAGNOSIS — F411 Generalized anxiety disorder: Secondary | ICD-10-CM | POA: Diagnosis not present

## 2020-10-31 DIAGNOSIS — R2689 Other abnormalities of gait and mobility: Secondary | ICD-10-CM | POA: Diagnosis not present

## 2020-10-31 DIAGNOSIS — M6281 Muscle weakness (generalized): Secondary | ICD-10-CM | POA: Diagnosis not present

## 2020-10-31 DIAGNOSIS — M25572 Pain in left ankle and joints of left foot: Secondary | ICD-10-CM | POA: Diagnosis not present

## 2020-10-31 DIAGNOSIS — R293 Abnormal posture: Secondary | ICD-10-CM | POA: Diagnosis not present

## 2020-10-31 DIAGNOSIS — M25672 Stiffness of left ankle, not elsewhere classified: Secondary | ICD-10-CM | POA: Diagnosis not present

## 2020-11-01 ENCOUNTER — Other Ambulatory Visit: Payer: Self-pay

## 2020-11-01 ENCOUNTER — Ambulatory Visit (HOSPITAL_COMMUNITY)
Admission: RE | Admit: 2020-11-01 | Discharge: 2020-11-01 | Disposition: A | Discharge status: Home / Self Care | Payer: PPO | Source: Ambulatory Visit | Attending: Diagnostic Neuroimaging | Admitting: Diagnostic Neuroimaging

## 2020-11-01 DIAGNOSIS — G20 Parkinson's disease: Secondary | ICD-10-CM | POA: Diagnosis not present

## 2020-11-01 DIAGNOSIS — R413 Other amnesia: Secondary | ICD-10-CM | POA: Diagnosis not present

## 2020-11-01 MED ORDER — IOFLUPANE I 123 185 MBQ/2.5ML IV SOLN
5.1000 | Freq: Once | INTRAVENOUS | Status: AC | PRN
  Administered 2020-11-01: 5.1 via INTRAVENOUS
  Filled 2020-11-01: qty 10

## 2020-11-01 MED ORDER — POTASSIUM IODIDE (ANTIDOTE) 130 MG PO TABS: 130.0000 mg | ORAL_TABLET | Freq: Once | ORAL | Status: AC

## 2020-11-01 MED ORDER — POTASSIUM IODIDE (ANTIDOTE) 130 MG PO TABS
ORAL_TABLET | ORAL | Status: AC
  Administered 2020-11-01: 130 mg via ORAL
  Filled 2020-11-01: qty 1

## 2020-11-06 DIAGNOSIS — M25672 Stiffness of left ankle, not elsewhere classified: Secondary | ICD-10-CM | POA: Diagnosis not present

## 2020-11-06 DIAGNOSIS — I10 Essential (primary) hypertension: Secondary | ICD-10-CM | POA: Diagnosis not present

## 2020-11-06 DIAGNOSIS — R293 Abnormal posture: Secondary | ICD-10-CM | POA: Diagnosis not present

## 2020-11-06 DIAGNOSIS — M792 Neuralgia and neuritis, unspecified: Secondary | ICD-10-CM | POA: Diagnosis not present

## 2020-11-06 DIAGNOSIS — M6281 Muscle weakness (generalized): Secondary | ICD-10-CM | POA: Diagnosis not present

## 2020-11-06 DIAGNOSIS — N644 Mastodynia: Secondary | ICD-10-CM | POA: Diagnosis not present

## 2020-11-06 DIAGNOSIS — R2689 Other abnormalities of gait and mobility: Secondary | ICD-10-CM | POA: Diagnosis not present

## 2020-11-06 DIAGNOSIS — M25572 Pain in left ankle and joints of left foot: Secondary | ICD-10-CM | POA: Diagnosis not present

## 2020-11-07 ENCOUNTER — Telehealth: Payer: Self-pay | Admitting: *Deleted

## 2020-11-07 NOTE — Telephone Encounter (Signed)
Called patient the DaTscan was normal.  That makes Parkinson's disease less likely. She stated her right arm shakes badly at times, left one shakes when she picks something up. She stated if Abilify is cause she can't stop it. She would like to come for FU to discuss options, scheduled for  soonest available. Patient verbalized understanding, appreciation.

## 2020-11-07 NOTE — Telephone Encounter (Signed)
The DaTscan was normal.  That makes Parkinson's disease less likely.

## 2020-11-12 DIAGNOSIS — L309 Dermatitis, unspecified: Secondary | ICD-10-CM | POA: Diagnosis not present

## 2020-11-12 DIAGNOSIS — D485 Neoplasm of uncertain behavior of skin: Secondary | ICD-10-CM | POA: Diagnosis not present

## 2020-11-12 DIAGNOSIS — L308 Other specified dermatitis: Secondary | ICD-10-CM | POA: Diagnosis not present

## 2020-11-14 DIAGNOSIS — M25572 Pain in left ankle and joints of left foot: Secondary | ICD-10-CM | POA: Diagnosis not present

## 2020-11-14 DIAGNOSIS — R2689 Other abnormalities of gait and mobility: Secondary | ICD-10-CM | POA: Diagnosis not present

## 2020-11-14 DIAGNOSIS — M25672 Stiffness of left ankle, not elsewhere classified: Secondary | ICD-10-CM | POA: Diagnosis not present

## 2020-11-14 DIAGNOSIS — M6281 Muscle weakness (generalized): Secondary | ICD-10-CM | POA: Diagnosis not present

## 2020-11-14 DIAGNOSIS — R293 Abnormal posture: Secondary | ICD-10-CM | POA: Diagnosis not present

## 2020-11-16 DIAGNOSIS — M5416 Radiculopathy, lumbar region: Secondary | ICD-10-CM | POA: Diagnosis not present

## 2020-11-16 DIAGNOSIS — I10 Essential (primary) hypertension: Secondary | ICD-10-CM | POA: Diagnosis not present

## 2020-11-20 DIAGNOSIS — G894 Chronic pain syndrome: Secondary | ICD-10-CM | POA: Diagnosis not present

## 2020-11-20 DIAGNOSIS — R2689 Other abnormalities of gait and mobility: Secondary | ICD-10-CM | POA: Diagnosis not present

## 2020-11-20 DIAGNOSIS — M5136 Other intervertebral disc degeneration, lumbar region: Secondary | ICD-10-CM | POA: Diagnosis not present

## 2020-11-20 DIAGNOSIS — M25672 Stiffness of left ankle, not elsewhere classified: Secondary | ICD-10-CM | POA: Diagnosis not present

## 2020-11-20 DIAGNOSIS — M25572 Pain in left ankle and joints of left foot: Secondary | ICD-10-CM | POA: Diagnosis not present

## 2020-11-20 DIAGNOSIS — M8949 Other hypertrophic osteoarthropathy, multiple sites: Secondary | ICD-10-CM | POA: Diagnosis not present

## 2020-11-20 DIAGNOSIS — M6281 Muscle weakness (generalized): Secondary | ICD-10-CM | POA: Diagnosis not present

## 2020-11-20 DIAGNOSIS — M792 Neuralgia and neuritis, unspecified: Secondary | ICD-10-CM | POA: Diagnosis not present

## 2020-11-20 DIAGNOSIS — R293 Abnormal posture: Secondary | ICD-10-CM | POA: Diagnosis not present

## 2020-11-21 DIAGNOSIS — R0989 Other specified symptoms and signs involving the circulatory and respiratory systems: Secondary | ICD-10-CM | POA: Insufficient documentation

## 2020-11-21 DIAGNOSIS — Z8249 Family history of ischemic heart disease and other diseases of the circulatory system: Secondary | ICD-10-CM | POA: Diagnosis not present

## 2020-11-21 DIAGNOSIS — G894 Chronic pain syndrome: Secondary | ICD-10-CM | POA: Diagnosis not present

## 2020-11-21 DIAGNOSIS — G4733 Obstructive sleep apnea (adult) (pediatric): Secondary | ICD-10-CM | POA: Diagnosis not present

## 2020-11-21 DIAGNOSIS — M545 Low back pain, unspecified: Secondary | ICD-10-CM | POA: Diagnosis not present

## 2020-11-21 DIAGNOSIS — Z823 Family history of stroke: Secondary | ICD-10-CM | POA: Diagnosis not present

## 2020-11-21 DIAGNOSIS — I10 Essential (primary) hypertension: Secondary | ICD-10-CM | POA: Diagnosis not present

## 2020-11-22 DIAGNOSIS — R2689 Other abnormalities of gait and mobility: Secondary | ICD-10-CM | POA: Diagnosis not present

## 2020-11-22 DIAGNOSIS — M6281 Muscle weakness (generalized): Secondary | ICD-10-CM | POA: Diagnosis not present

## 2020-11-22 DIAGNOSIS — M25672 Stiffness of left ankle, not elsewhere classified: Secondary | ICD-10-CM | POA: Diagnosis not present

## 2020-11-22 DIAGNOSIS — M25572 Pain in left ankle and joints of left foot: Secondary | ICD-10-CM | POA: Diagnosis not present

## 2020-11-22 DIAGNOSIS — R293 Abnormal posture: Secondary | ICD-10-CM | POA: Diagnosis not present

## 2020-11-29 DIAGNOSIS — R2689 Other abnormalities of gait and mobility: Secondary | ICD-10-CM | POA: Diagnosis not present

## 2020-11-29 DIAGNOSIS — L309 Dermatitis, unspecified: Secondary | ICD-10-CM | POA: Diagnosis not present

## 2020-11-29 DIAGNOSIS — R293 Abnormal posture: Secondary | ICD-10-CM | POA: Diagnosis not present

## 2020-11-29 DIAGNOSIS — M25572 Pain in left ankle and joints of left foot: Secondary | ICD-10-CM | POA: Diagnosis not present

## 2020-11-29 DIAGNOSIS — M6281 Muscle weakness (generalized): Secondary | ICD-10-CM | POA: Diagnosis not present

## 2020-11-29 DIAGNOSIS — L209 Atopic dermatitis, unspecified: Secondary | ICD-10-CM | POA: Diagnosis not present

## 2020-11-29 DIAGNOSIS — M25672 Stiffness of left ankle, not elsewhere classified: Secondary | ICD-10-CM | POA: Diagnosis not present

## 2020-11-30 DIAGNOSIS — F411 Generalized anxiety disorder: Secondary | ICD-10-CM | POA: Diagnosis not present

## 2020-11-30 DIAGNOSIS — F332 Major depressive disorder, recurrent severe without psychotic features: Secondary | ICD-10-CM | POA: Diagnosis not present

## 2020-12-03 DIAGNOSIS — I739 Peripheral vascular disease, unspecified: Secondary | ICD-10-CM | POA: Diagnosis not present

## 2020-12-03 DIAGNOSIS — M2042 Other hammer toe(s) (acquired), left foot: Secondary | ICD-10-CM | POA: Diagnosis not present

## 2020-12-03 DIAGNOSIS — M2041 Other hammer toe(s) (acquired), right foot: Secondary | ICD-10-CM | POA: Diagnosis not present

## 2020-12-03 DIAGNOSIS — L608 Other nail disorders: Secondary | ICD-10-CM | POA: Diagnosis not present

## 2020-12-05 ENCOUNTER — Ambulatory Visit: Payer: Self-pay | Admitting: Diagnostic Neuroimaging

## 2020-12-13 ENCOUNTER — Telehealth (INDEPENDENT_AMBULATORY_CARE_PROVIDER_SITE_OTHER): Payer: PPO | Admitting: Psychiatry

## 2020-12-13 ENCOUNTER — Other Ambulatory Visit: Payer: Self-pay

## 2020-12-13 DIAGNOSIS — F411 Generalized anxiety disorder: Secondary | ICD-10-CM | POA: Diagnosis not present

## 2020-12-13 DIAGNOSIS — F332 Major depressive disorder, recurrent severe without psychotic features: Secondary | ICD-10-CM | POA: Diagnosis not present

## 2020-12-13 DIAGNOSIS — F952 Tourette's disorder: Secondary | ICD-10-CM

## 2020-12-13 MED ORDER — ARIPIPRAZOLE 30 MG PO TABS: 30.0000 mg | ORAL_TABLET | Freq: Every day | ORAL | 0 refills | Status: DC

## 2020-12-13 NOTE — Progress Notes (Signed)
Virtual Visit via Telephone Note  I connected with Penny Porter on 12/13/20 at 10:30 AM EDT by telephone and verified that I am speaking with the correct person using two identifiers.  Location: Patient: home Provider: office   I discussed the limitations, risks, security and privacy concerns of performing an evaluation and management service by telephone and the availability of in person appointments. I also discussed with the patient that there may be a patient responsible charge related to this service. The patient expressed understanding and agreed to proceed.   History of Present Illness: Penny Porter's BP is up and down and she is working with a cardiologist. Her recent DaTscan was normal and she was told that makes Parkinson's disease less likely. Her tremors are ongoing.  She does not want to stop Abilify and is going to follow up with neuro to discuss options on 12/25/20. Her back pain is ongoing and the inability to do physical things makes her depressed. She is worried about what will happen if she gets the point where she is unable to walk. She is on longer having arm tics but the abdominal ones continue. Penny Porter has been randomly been bringing her fingers to her face. It happens when she is stressed and not a daily thing. Her depression is mostly on days where she is physically limited from activities. She tries to stay busy and helps around the house and engages in hobbies. Her sleep is good. Most days her energy is low. She denies SI/HI. Her anxiety is ok. It is not happening daily and when it does happen she is able to manage it. Her anxiety is mostly centered on her health. Therapy is helping her mood and anxiety.    Observations/Objective:  General Appearance: unable to assess  Eye Contact:  unable to assess  Speech:  Clear and Coherent and Normal Rate  Volume:  Normal  Mood:  Euthymic  Affect:  Full Range  Thought Process:  Goal Directed, Linear and Descriptions of Associations: Intact   Orientation:  Full (Time, Place, and Person)  Thought Content:  Logical  Suicidal Thoughts:  No  Homicidal Thoughts:  No  Memory:  Immediate;   Good  Judgement:  Good  Insight:  Good  Psychomotor Activity: unable to assess  Concentration:  Concentration: Good  Recall:  Good  Fund of Knowledge:  Good  Language:  Good  Akathisia:  unable to assess  Handed:  Right  AIMS (if indicated):     Assets:  Communication Skills Desire for Improvement Financial Resources/Insurance Housing Resilience Social Support Talents/Skills Transportation Vocational/Educational  ADL's:  unable to assess  Cognition:  WNL  Sleep:         Assessment and Plan: Depression screen [facility] 2/9 12/13/2020 10/25/2020 10/04/2020 08/30/2018 06/02/2017  Decreased Interest 1 1 0 0 0  Down, Depressed, Hopeless 2 1 2  0 0  PHQ - 2 Score 3 2 2  0 0  Altered sleeping 0 0 1 - -  Tired, decreased energy 3 3 1  - -  Change in appetite 0 0 0 - -  Feeling bad or failure about yourself  2 3 2  - -  Trouble concentrating 0 0 1 - -  Moving slowly or fidgety/restless 0 0 2 - -  Suicidal thoughts 0 0 0 - -  PHQ-9 Score 8 8 9  - -  Difficult doing work/chores Somewhat difficult - - - -    Flowsheet Row Video Visit from 12/13/2020 in [facility] Video Visit  from 10/25/2020 in [facility] Video Visit from 10/04/2020 in [facility]  C-SSRS RISK CATEGORY No Risk No Risk No Risk     1. Major depressive disorder, recurrent, severe without psychotic features ([place]) - ARIPiprazole (ABILIFY) 30 MG tablet; Take 1 tablet (30 mg total) by mouth daily.  Dispense: 90 tablet; Refill: 0  2. Tourette's disease - ARIPiprazole (ABILIFY) 30 MG tablet; Take 1 tablet (30 mg total) by mouth daily.  Dispense: 90 tablet; Refill: 0  3. GAD (generalized anxiety disorder)  - no refill needed on Vistaril and Wellbutrin  - therapy is now  1x/month  Follow Up Instructions: In 3- 4 months or sooner if needed   I discussed the assessment and treatment plan with the patient. The patient was provided an opportunity to ask questions and all were answered. The patient agreed with the plan and demonstrated an understanding of the instructions.   The patient was advised to call back or seek an in-person evaluation if the symptoms worsen or if the condition fails to improve as anticipated.  I provided 15 minutes of non-face-to-face time during this encounter.   Charlcie Cradle, MD

## 2020-12-17 DIAGNOSIS — L02211 Cutaneous abscess of abdominal wall: Secondary | ICD-10-CM | POA: Diagnosis not present

## 2020-12-17 DIAGNOSIS — I10 Essential (primary) hypertension: Secondary | ICD-10-CM | POA: Diagnosis not present

## 2020-12-17 DIAGNOSIS — M1712 Unilateral primary osteoarthritis, left knee: Secondary | ICD-10-CM | POA: Diagnosis not present

## 2020-12-19 ENCOUNTER — Ambulatory Visit: Payer: PPO | Admitting: Allergy and Immunology

## 2020-12-19 DIAGNOSIS — L0100 Impetigo, unspecified: Secondary | ICD-10-CM | POA: Diagnosis not present

## 2020-12-25 ENCOUNTER — Other Ambulatory Visit: Payer: Self-pay

## 2020-12-25 ENCOUNTER — Ambulatory Visit: Payer: PPO | Admitting: Diagnostic Neuroimaging

## 2020-12-25 ENCOUNTER — Encounter: Payer: Self-pay | Admitting: Diagnostic Neuroimaging

## 2020-12-25 VITALS — BP 125/74 | HR 73 | Ht 69.0 in | Wt 279.2 lb

## 2020-12-25 DIAGNOSIS — G2119 Other drug induced secondary parkinsonism: Secondary | ICD-10-CM | POA: Diagnosis not present

## 2020-12-25 NOTE — Progress Notes (Signed)
[facility]  PATIENT: Penny Porter DOB: Jul 12, 1966  REFERRING CLINICIAN: Raina Mina., MD  HISTORY FROM: patient REASON FOR VISIT: follow up   HISTORICAL  CHIEF COMPLAINT:  Chief Complaint  Patient presents with  . Tremors    Rm 6 "FU to discuss DAT scan, next steps, meds, both hands shaking now"  friend- [place]:   UPDATE (12/25/20, VRP): Since last visit, tremor continues. DATscan was negative. Abilify helping depression and tourette's.     UPDATE (07/17/20, VRP): Since last visit, doing about the same. Intermittent tremor at home in hands. Today no tremor. Abilify helping with patients tourettes and depression. Psych asking if abilify is causing tremors.  UPDATE (05/31/19, VRP): Since last visit, doing about the same. Postural tremor in left hand continues. Carb/levo 1 tab twice a day --> not much benefit. Symptoms are stable. Severity is moderate. No alleviating or aggravating factors.    PRIOR HPI (01/25/19): 55 year old female here for evaluation of tremors.  Patient reports approximately 2 months of intermittent postural tremors in her left hand when she holds objects.  She has also noticed that her fingers and hands tend to "lock up" at random times.  Patient has significant psychiatry history including anxiety, depression, Tourette's syndrome.  Patient has been on risperidone and Abilify.  She had developed some tremors in her legs and was prescribed Cogentin in the past but stopped this more recently.  Patient has had some dizziness and vertigo but these have resolved.   REVIEW OF SYSTEMS: Full 14 system review of systems performed and negative with exception of: as per HPI.   ALLERGIES: Allergies  Allergen Reactions  . Amoxicillin Hives and Swelling    LIP SWELLING PATIENT HAS HAD A PCN REACTION WITH IMMEDIATE RASH, FACIAL/TONGUE/THROAT SWELLING, SOB, OR LIGHTHEADEDNESS WITH HYPOTENSION:  #  #  #  YES  #  #  #   HAS  PT DEVELOPED SEVERE RASH INVOLVING MUCUS MEMBRANES or SKIN NECROSIS: #  #  #  YES  #  #  #  Has patient had a PCN reaction that required hospitalization: No Has patient had a PCN reaction occurring within the last 10 years: Unknown If all of the above answers are "NO", then may proceed with Cephalosporin use.   . Avelox [Moxifloxacin Hcl In Nacl] Other (See Comments)    Caused pain in shoulders to finger, tendonitis  . Adhesive [Tape] Dermatitis    Blisters skin  . Bextra [Valdecoxib] Hives and Swelling  . Doxycycline Other (See Comments)  . Doxycycline Hyclate Hives and Swelling  . Gadobutrol Hives  . Gadolinium Derivatives Hives  . Gadoversetamide Hives  . Iodinated Diagnostic Agents Hives  . Lamictal [Lamotrigine] Hives  . Naproxen Hypertension  . Oxycodone-Acetaminophen Other (See Comments)  . Percolone [Oxycodone] Hives  . Relafen [Nabumetone] Hypertension  . Risperdal [Risperidone] Other (See Comments)    Leg weakness   . Nitrofurantoin Other (See Comments)  . Brintellix [Vortioxetine] Nausea And Vomiting  . Fetzima [Levomilnacipran] Other (See Comments)    Tremors across head   . Moxifloxacin Other (See Comments)    Caused pain in shoulders to finger, tendonitis  . Sulfamethoxazole-Trimethoprim Rash    Flushing and rash    HOME MEDICATIONS: Outpatient Medications Prior to Visit  Medication Sig Dispense Refill  . Acetaminophen (TYLENOL PO) Take 1 tablet by mouth daily as needed (PAIN).    Marland Kitchen albuterol (PROVENTIL) (2.5 MG/3ML) 0.083% nebulizer solution Can use one vial  in nebulizer every four to six hours as needed for cough or wheeze. 180 mL 1  . allopurinol (ZYLOPRIM) 100 MG tablet Take by mouth 2 (two) times daily.     . ARIPiprazole (ABILIFY) 30 MG tablet Take 1 tablet (30 mg total) by mouth daily. 90 tablet 0  . ASMANEX HFA 200 MCG/ACT AERO INHALE 2 PUFFS INTO THE LUNGS DAILY. RINSE, GARGLE, AND SPIT AFTER USE. 13 g 5  . aspirin 81 MG tablet Take 81 mg by mouth  daily.    Marland Kitchen azithromycin (ZITHROMAX) 250 MG tablet TAKE ONE TABLET BY MOUTH 3 TIMES WEEKLY 12 tablet 4  . buPROPion (WELLBUTRIN XL) 300 MG 24 hr tablet TAKE 1 TABLET BY MOUTH EVERY DAY IN THE MORNING 90 tablet 0  . Cholecalciferol (VITAMIN D-3) 5000 UNITS TABS Take 5,000 Units by mouth daily.     . famotidine (PEPCID) 20 MG tablet Take 20 mg by mouth 2 (two) times daily.    . flecainide (TAMBOCOR) 50 MG tablet SMARTSIG:1 Tablet(s) By Mouth Every 12 Hours    . furosemide (LASIX) 20 MG tablet Take 20 mg by mouth daily.    . hydrOXYzine (ATARAX/VISTARIL) 10 MG tablet Take 1 tablet (10 mg total) by mouth 3 (three) times daily as needed for anxiety. 270 tablet 0  . ipratropium (ATROVENT) 0.03 % nasal spray USE 2 SPRAYS IN NOSTRIL (S) 2 TIMES DAILY AS DIRECTED  11  . ketoconazole (NIZORAL) 2 % shampoo Apply 1 application topically as needed for irritation.     Marland Kitchen loratadine (CLARITIN) 10 MG tablet Take 10 mg by mouth at bedtime.     Marland Kitchen losartan (COZAAR) 50 MG tablet Take 50 mg by mouth daily.    Marland Kitchen lubiprostone (AMITIZA) 24 MCG capsule Take 24 mcg by mouth daily as needed for constipation.     . meclizine (ANTIVERT) 25 MG tablet PLEASE SEE ATTACHED FOR DETAILED DIRECTIONS    . meloxicam (MOBIC) 7.5 MG tablet Take 7.5 mg by mouth 2 (two) times daily.  0  . methocarbamol (ROBAXIN) 500 MG tablet Take 500 mg by mouth 4 (four) times daily.    . metoprolol succinate (TOPROL-XL) 25 MG 24 hr tablet Take by mouth 3 (three) times daily.     . montelukast (SINGULAIR) 10 MG tablet Take 10 mg by mouth daily.  5  . mupirocin ointment (BACTROBAN) 2 % Place 1 application into the nose 2 (two) times daily as needed (IRRITATION).     Marland Kitchen ondansetron (ZOFRAN-ODT) 4 MG disintegrating tablet Take 4 mg by mouth 2 (two) times daily as needed for nausea or vomiting.    . pregabalin (LYRICA) 75 MG capsule Take 75 mg by mouth 2 (two) times daily. Reported on 10/09/2015    . PROAIR RESPICLICK 119 (90 Base) MCG/ACT AEPB INHALE TWO  DOSES BY MOUTH EVERY FOUR TO SIX HOURS AS NEEDED FOR COUGH OR WHEEZE. 3 each 0  . SPIRIVA RESPIMAT 1.25 MCG/ACT AERS INHALE TWO PUFFS ONCE DAILY AS DIRECTED. 1 each 4  . spironolactone (ALDACTONE) 25 MG tablet Take 25 mg by mouth daily.    . traMADol (ULTRAM) 50 MG tablet Take 50 mg by mouth.    . triamcinolone cream (KENALOG) 0.1 % Apply 1 application topically 2 (two) times daily as needed (IRRITATION).     Marland Kitchen vitamin B-12 (CYANOCOBALAMIN) 1000 MCG tablet Take 1,000 mcg by mouth daily.    Marland Kitchen triamcinolone ointment (KENALOG) 0.5 % Apply 1 application topically 2 (two) times daily as needed (IRRITATION).  (  Patient not taking: Reported on 12/25/2020)     No facility-administered medications prior to visit.    PAST MEDICAL HISTORY: Past Medical History:  Diagnosis Date  . Anxiety   . Arthritis    Back and hip  . Asthma   . Bursitis   . Complication of anesthesia    bladder hard to wake up, never needed a in and out cath  . Costochondral chest pain   . Degenerative disk disease   . Depression   . Gastroparesis   . GERD (gastroesophageal reflux disease)   . Hypertension   . Lumbar herniated disc   . Premature atrial beats 12/2019  . Sleep apnea    not using CPAP  . Stenosis of lumbosacral spine   . Stress fracture of foot   . Thyroid nodule   . Tourette disease   . Trigeminal nerve disease     PAST SURGICAL HISTORY: Past Surgical History:  Procedure Laterality Date  . BACK SURGERY  September 19, 2015  . bone spur removal on back    . CHOLECYSTECTOMY  2005  . COLONOSCOPY    . ELBOW SURGERY Left May 29, 2014  . ESOPHAGOGASTRODUODENOSCOPY    . KNEE SURGERY  06/2016  . polyp removal    . POSTERIOR FUSION LUMBAR SPINE  06/26/2017  . ruptured disk  2008  . TEAR DUCT PROBING  2009    FAMILY HISTORY: Family History  Problem Relation Age of Onset  . Mental retardation Sister   . Dementia Father   . Cancer Mother   . Cancer Brother        colorectal  . Suicidality Neg Hx      SOCIAL HISTORY: Social History   Socioeconomic History  . Marital status: Single    Spouse name: Not on file  . Number of children: Not on file  . Years of education: Not on file  . Highest education level: High school graduate  Occupational History    Comment: disabled   Tobacco Use  . Smoking status: Never Smoker  . Smokeless tobacco: Never Used  Vaping Use  . Vaping Use: Never used  Substance and Sexual Activity  . Alcohol use: No  . Drug use: No  . Sexual activity: Not Currently  Other Topics Concern  . Not on file  Social History Narrative   Lives with 2 sisters   Caffeine- none   Social Determinants of Health   Financial Resource Strain: Not on file  Food Insecurity: Not on file  Transportation Needs: Not on file  Physical Activity: Not on file  Stress: Not on file  Social Connections: Not on file  Intimate Partner Violence: Not on file     PHYSICAL EXAM  GENERAL EXAM/CONSTITUTIONAL: Vitals:  Vitals:   12/25/20 1001  BP: 125/74  Pulse: 73  Weight: 279 lb 3.2 oz (126.6 kg)  Height: 5\' 9"  (1.753 m)   Body mass index is 41.23 kg/m. Wt Readings from Last 3 Encounters:  12/25/20 279 lb 3.2 oz (126.6 kg)  07/17/20 270 lb 12.8 oz (122.8 kg)  05/31/19 271 lb 9.6 oz (123.2 kg)    Patient is in no distress; well developed, nourished and groomed; neck is supple  CARDIOVASCULAR:  Examination of carotid arteries is normal; no carotid bruits  Regular rate and rhythm, no murmurs  Examination of peripheral vascular system by observation and palpation is normal  EYES:  Ophthalmoscopic exam of optic discs and posterior segments is normal; no papilledema or hemorrhages No  exam data present  MUSCULOSKELETAL:  Gait, strength, tone, movements noted in Neurologic exam below  NEUROLOGIC: MENTAL STATUS:  No flowsheet data found.  awake, alert, oriented to person, place and time  recent and remote memory intact  normal attention and  concentration  language fluent, comprehension intact, naming intact  fund of knowledge appropriate  CRANIAL NERVE:   2nd - no papilledema on fundoscopic exam  2nd, 3rd, 4th, 6th - pupils equal and reactive to light, visual fields full to confrontation, extraocular muscles intact, no nystagmus  5th - facial sensation symmetric  7th - facial strength symmetric  8th - hearing intact  9th - palate elevates symmetrically, uvula midline  11th - shoulder shrug symmetric  12th - tongue protrusion midline  MASKED FACIES  MOTOR:   NO TREMOR  MILD RIGIDITY IN BUE; MILD BRADYKINESIA IN BUE  full strength in the BUE, BLE  SENSORY:   normal and symmetric to light touch; DECR VIB AT TOES  COORDINATION:   finger-nose-finger, fine finger movements --> SLOW  REFLEXES:   deep tendon reflexes TRACE and symmetric  GAIT/STATION:   USES CANE; DECR ARM SWING; SHORT STEPS    DIAGNOSTIC DATA (LABS, IMAGING, TESTING) - I reviewed patient records, labs, notes, testing and imaging myself where available.  Lab Results  Component Value Date   WBC 9.0 06/18/2017   HGB 14.6 06/18/2017   HCT 43.2 06/18/2017   MCV 94.3 06/18/2017   PLT 212 06/18/2017      Component Value Date/Time   NA 138 06/18/2017 1051   K 3.9 06/18/2017 1051   CL 106 06/18/2017 1051   CO2 27 06/18/2017 1051   GLUCOSE 76 06/18/2017 1051   BUN 14 06/18/2017 1051   CREATININE 0.97 06/18/2017 1051   CALCIUM 9.0 06/18/2017 1051   GFRNONAA >60 06/18/2017 1051   GFRAA >60 06/18/2017 1051   No results found for: CHOL, HDL, LDLCALC, LDLDIRECT, TRIG, CHOLHDL No results found for: HGBA1C No results found for: VITAMINB12 No results found for: TSH   02/22/19 MRI brain - normal   11/01/20 DATscan Normal Ioflupane scan. No reduced radiotracer activity in basal ganglia to suggest Parkinson's syndrome pathology.   ASSESSMENT AND PLAN  55 y.o. year old female here with mild postural tremor in left upper  extremity, mild rigidity and bradykinesia.  Could represent essential tremor versus parkinsonism (drug-induced versus idiopathic). Trial of carb/levo not that helpful in 2020.  Dx:  1. Drug-induced parkinsonism ([place])      PLAN:  AKINETIC-RIGID PARKINSONISM (drug induced --> abilify) - continue abilify; may discuss with psychiatry to slightly reduce dosing - could consider carb/levo again in future, but caution due to polypharmacy; also didn't help in 2020 1 tab twice a day   Return for pending if symptoms worsen or fail to improve.    Penni Bombard, MD 6/81/1572, 62:03 AM Certified in Neurology, Neurophysiology and Neuroimaging  [facility] [address] [place], [place] [address] [phone]

## 2020-12-25 NOTE — Patient Instructions (Addendum)
  AKINETIC-RIGID PARKINSONISM (drug induced --> abilify) - continue abilify; may discuss with psychiatry to slightly reduce dosing - could consider carb/levo again in future, but caution due to polypharmacy

## 2020-12-26 ENCOUNTER — Other Ambulatory Visit: Payer: Self-pay

## 2020-12-26 ENCOUNTER — Ambulatory Visit: Payer: PPO | Admitting: Allergy and Immunology

## 2020-12-26 ENCOUNTER — Encounter: Payer: Self-pay | Admitting: Allergy and Immunology

## 2020-12-26 ENCOUNTER — Other Ambulatory Visit: Payer: Self-pay | Admitting: Allergy and Immunology

## 2020-12-26 VITALS — BP 128/84 | HR 76 | Resp 18

## 2020-12-26 DIAGNOSIS — K219 Gastro-esophageal reflux disease without esophagitis: Secondary | ICD-10-CM

## 2020-12-26 DIAGNOSIS — J4540 Moderate persistent asthma, uncomplicated: Secondary | ICD-10-CM | POA: Diagnosis not present

## 2020-12-26 DIAGNOSIS — J3089 Other allergic rhinitis: Secondary | ICD-10-CM

## 2020-12-26 MED ORDER — AEROCHAMBER PLUS MISC: 2 refills | Status: AC

## 2020-12-26 NOTE — Patient Instructions (Addendum)
  1.  Continue Asmanex 200 - 2 inhalations 1 time a day with spacer during periods of asthma activity  2.  Continue Spiriva 1.25 Respimat -2 inhalations 1 time per day during periods of asthma activity  3. Continue OTC Nasacort one spray each 1 time per day during upper airway symptoms  4. Continue montelukast 10mg  one tablet one time per day  5. Continue famotidine 20 mg 2 times per day   6. Continue ProAir HFA or similar and antihistamine and nasal ipratropium if needed  7. Return to clinic in 12 months or earlier if problem

## 2020-12-26 NOTE — Progress Notes (Signed)
[facility] [place] [facility] [place]   Follow-up Note  Referring Provider: Raina Mina., MD Primary Provider: Raina Mina., MD Date of Office Visit: 12/26/2020  Subjective:   Penny Porter (DOB: 08-04-1966) is a 55 y.o. female who returns to the [facility] on 12/26/2020 in re-evaluation of the following:  HPI: Penny Porter returns to this clinic in reevaluation of asthma and allergic rhinitis and LPR.  Her last visit to this clinic was 20 June 2020.  She has had very good control of her asthma and has not required a systemic steroid to treat an exacerbation while she intermittently uses a combination of Asmanex and Spiriva.  Sometimes she can go several weeks without these agents.  Her requirement for short acting bronchodilators less than 1 time per week.  She does not really exercise because of back and knee issues.  She has had very good control of the issue with her nose while once again intermittently using her nasal steroid.  She has not required an antibiotic to treat an episode of sinusitis.  Her reflux is under very good control while using famotidine twice a day.  She has obtained a flu vaccine.  She will not be obtaining COVID vaccines.  She did contract COVID infection in September 2021.  Allergies as of 12/26/2020      Reactions   Amoxicillin Hives, Swelling   LIP SWELLING PATIENT HAS HAD A PCN REACTION WITH IMMEDIATE RASH, FACIAL/TONGUE/THROAT SWELLING, SOB, OR LIGHTHEADEDNESS WITH HYPOTENSION:  #  #  #  YES  #  #  #   HAS PT DEVELOPED SEVERE RASH INVOLVING MUCUS MEMBRANES or SKIN NECROSIS: #  #  #  YES  #  #  #  Has patient had a PCN reaction that required hospitalization: No Has patient had a PCN reaction occurring within the last 10 years: Unknown If all of the above answers are "NO", then may proceed with Cephalosporin use.   Avelox [moxifloxacin Hcl In Nacl] Other (See Comments)   Caused pain in shoulders to finger, tendonitis    Adhesive [tape] Dermatitis   Blisters skin   Bextra [valdecoxib] Hives, Swelling   Doxycycline Other (See Comments)   Doxycycline Hyclate Hives, Swelling   Gadobutrol Hives   Gadolinium Derivatives Hives   Gadoversetamide Hives   Iodinated Diagnostic Agents Hives   Lamictal [lamotrigine] Hives   Naproxen Hypertension   Oxycodone-acetaminophen Other (See Comments)   Percolone [oxycodone] Hives   Relafen [nabumetone] Hypertension   Risperdal [risperidone] Other (See Comments)   Leg weakness   Nitrofurantoin Other (See Comments)   Brintellix [vortioxetine] Nausea And Vomiting   Fetzima [levomilnacipran] Other (See Comments)   Tremors across head   Moxifloxacin Other (See Comments)   Caused pain in shoulders to finger, tendonitis   Sulfamethoxazole-trimethoprim Rash   Flushing and rash      Medication List      albuterol (2.5 MG/3ML) 0.083% nebulizer solution Commonly known as: PROVENTIL Can use one vial in nebulizer every four to six hours as needed for cough or wheeze.   ProAir RespiClick 096 (90 Base) MCG/ACT Aepb Generic drug: Albuterol Sulfate INHALE TWO DOSES BY MOUTH EVERY FOUR TO SIX HOURS AS NEEDED FOR COUGH OR WHEEZE.   allopurinol 100 MG tablet Commonly known as: ZYLOPRIM Take by mouth 2 (two) times daily.   ARIPiprazole 30 MG tablet Commonly known as: ABILIFY Take 1 tablet (30 mg total) by mouth daily.   Asmanex HFA 200  MCG/ACT Aero Generic drug: Mometasone Furoate INHALE 2 PUFFS INTO THE LUNGS DAILY. RINSE, GARGLE, AND SPIT AFTER USE.   aspirin 81 MG tablet Take 81 mg by mouth daily.   azithromycin 250 MG tablet Commonly known as: ZITHROMAX TAKE ONE TABLET BY MOUTH 3 TIMES WEEKLY   buPROPion 300 MG 24 hr tablet Commonly known as: WELLBUTRIN XL TAKE 1 TABLET BY MOUTH EVERY DAY IN THE MORNING   famotidine 20 MG tablet Commonly known as: PEPCID Take 20 mg by mouth 2 (two) times daily.   flecainide 50 MG tablet Commonly known as:  TAMBOCOR SMARTSIG:1 Tablet(s) By Mouth Every 12 Hours   furosemide 20 MG tablet Commonly known as: LASIX Take 20 mg by mouth daily.   hydrOXYzine 10 MG tablet Commonly known as: ATARAX/VISTARIL Take 1 tablet (10 mg total) by mouth 3 (three) times daily as needed for anxiety.   ipratropium 0.03 % nasal spray Commonly known as: ATROVENT USE 2 SPRAYS IN NOSTRIL (S) 2 TIMES DAILY AS DIRECTED   ketoconazole 2 % shampoo Commonly known as: NIZORAL Apply 1 application topically as needed for irritation.   loratadine 10 MG tablet Commonly known as: CLARITIN Take 10 mg by mouth at bedtime.   losartan 50 MG tablet Commonly known as: COZAAR Take 50 mg by mouth daily.   lubiprostone 24 MCG capsule Commonly known as: AMITIZA Take 24 mcg by mouth daily as needed for constipation.   meclizine 25 MG tablet Commonly known as: ANTIVERT PLEASE SEE ATTACHED FOR DETAILED DIRECTIONS   meloxicam 7.5 MG tablet Commonly known as: MOBIC Take 7.5 mg by mouth 2 (two) times daily.   methocarbamol 500 MG tablet Commonly known as: ROBAXIN Take 500 mg by mouth 4 (four) times daily.   metoprolol succinate 25 MG 24 hr tablet Commonly known as: TOPROL-XL Take by mouth 3 (three) times daily.   montelukast 10 MG tablet Commonly known as: SINGULAIR Take 10 mg by mouth daily.   mupirocin ointment 2 % Commonly known as: BACTROBAN Place 1 application into the nose 2 (two) times daily as needed (IRRITATION).   ondansetron 4 MG disintegrating tablet Commonly known as: ZOFRAN-ODT Take 4 mg by mouth 2 (two) times daily as needed for nausea or vomiting.   pregabalin 75 MG capsule Commonly known as: LYRICA Take 75 mg by mouth 2 (two) times daily. Reported on 10/09/2015   Spiriva Respimat 1.25 MCG/ACT Aers Generic drug: Tiotropium Bromide Monohydrate INHALE TWO PUFFS ONCE DAILY AS DIRECTED.   spironolactone 25 MG tablet Commonly known as: ALDACTONE Take 25 mg by mouth daily.   traMADol 50 MG  tablet Commonly known as: ULTRAM Take 50 mg by mouth.   triamcinolone cream 0.1 % Commonly known as: KENALOG Apply 1 application topically 2 (two) times daily as needed (IRRITATION).   triamcinolone ointment 0.5 % Commonly known as: KENALOG Apply 1 application topically 2 (two) times daily as needed (IRRITATION).   TYLENOL PO Take 1 tablet by mouth daily as needed (PAIN).   vitamin B-12 1000 MCG tablet Commonly known as: CYANOCOBALAMIN Take 1,000 mcg by mouth daily.   Vitamin D-3 125 MCG (5000 UT) Tabs Take 5,000 Units by mouth daily.       Past Medical History:  Diagnosis Date  . Anxiety   . Arthritis    Back and hip  . Asthma   . Bursitis   . Complication of anesthesia    bladder hard to wake up, never needed a in and out cath  . Costochondral chest  pain   . Degenerative disk disease   . Depression   . Gastroparesis   . GERD (gastroesophageal reflux disease)   . Hypertension   . Lumbar herniated disc   . Premature atrial beats 12/2019  . Sleep apnea    not using CPAP  . Stenosis of lumbosacral spine   . Stress fracture of foot   . Thyroid nodule   . Tourette disease   . Trigeminal nerve disease     Past Surgical History:  Procedure Laterality Date  . BACK SURGERY  September 19, 2015  . bone spur removal on back    . CHOLECYSTECTOMY  2005  . COLONOSCOPY    . ELBOW SURGERY Left May 29, 2014  . ESOPHAGOGASTRODUODENOSCOPY    . KNEE SURGERY  06/2016  . polyp removal    . POSTERIOR FUSION LUMBAR SPINE  06/26/2017  . ruptured disk  2008  . TEAR DUCT PROBING  2009    Review of systems negative except as noted in HPI / PMHx or noted below:  Review of Systems  Constitutional: Negative.   HENT: Negative.   Eyes: Negative.   Respiratory: Negative.   Cardiovascular: Negative.   Gastrointestinal: Negative.   Genitourinary: Negative.   Musculoskeletal: Negative.   Skin: Negative.   Neurological: Negative.   Endo/Heme/Allergies: Negative.    Psychiatric/Behavioral: Negative.      Objective:   Vitals:   12/26/20 1023  BP: 128/84  Pulse: 76  Resp: 18  SpO2: 98%          Physical Exam Constitutional:      Appearance: She is not diaphoretic.  HENT:     Head: Normocephalic.     Right Ear: Tympanic membrane, ear canal and external ear normal.     Left Ear: Tympanic membrane, ear canal and external ear normal.     Nose: Nose normal. No mucosal edema or rhinorrhea.     Mouth/Throat:     Pharynx: Uvula midline. No oropharyngeal exudate.  Eyes:     Conjunctiva/sclera: Conjunctivae normal.  Neck:     Thyroid: No thyromegaly.     Trachea: Trachea normal. No tracheal tenderness or tracheal deviation.  Cardiovascular:     Rate and Rhythm: Normal rate and regular rhythm.     Heart sounds: Normal heart sounds, S1 normal and S2 normal. No murmur heard.   Pulmonary:     Effort: No respiratory distress.     Breath sounds: Normal breath sounds. No stridor. No wheezing or rales.  Lymphadenopathy:     Head:     Right side of head: No tonsillar adenopathy.     Left side of head: No tonsillar adenopathy.     Cervical: No cervical adenopathy.  Skin:    Findings: No erythema or rash.     Nails: There is no clubbing.  Neurological:     Mental Status: She is alert.     Diagnostics:    Spirometry was performed and demonstrated an FEV1 of 1.64 at 51 % of predicted.  She had a less than optimal effort on the spirometric maneuver.  Assessment and Plan:   1. Asthma, moderate persistent, well-controlled   2. Other allergic rhinitis   3. LPRD (laryngopharyngeal reflux disease)     1.  Continue Asmanex 200 - 2 inhalations 1 time a day with spacer during periods of asthma activity  2.  Continue Spiriva 1.25 Respimat -2 inhalations 1 time per day during periods of asthma activity  3. Continue OTC Nasacort  one spray each 1 time per day during upper airway symptoms  4. Continue montelukast 10mg  one tablet one time per  day  5. Continue famotidine 20 mg 2 times per day   6. Continue ProAir HFA or similar and antihistamine and nasal ipratropium if needed  7. Return to clinic in 12 months or earlier if problem  Marche appears to be doing very well on her current plan of action.  She uses her medications on an as-needed basis and for the most part has done very well with this approach.  She appears to have a very good understanding of how her medications work and appropriate dosing of her medications depending on disease activity.  We will continue to have her use anti-inflammatory agents for airway and therapy directed against reflux as noted above and I will see her back in this clinic in 1 year or earlier if there is a problem.  Allena Katz, MD Allergy / Immunology [place]

## 2020-12-27 ENCOUNTER — Encounter: Payer: Self-pay | Admitting: Allergy and Immunology

## 2020-12-27 ENCOUNTER — Telehealth (INDEPENDENT_AMBULATORY_CARE_PROVIDER_SITE_OTHER): Payer: PPO | Admitting: Psychiatry

## 2020-12-27 DIAGNOSIS — F411 Generalized anxiety disorder: Secondary | ICD-10-CM

## 2020-12-27 DIAGNOSIS — F952 Tourette's disorder: Secondary | ICD-10-CM | POA: Diagnosis not present

## 2020-12-27 DIAGNOSIS — F332 Major depressive disorder, recurrent severe without psychotic features: Secondary | ICD-10-CM

## 2020-12-27 MED ORDER — HYDROXYZINE HCL 10 MG PO TABS
10.0000 mg | ORAL_TABLET | Freq: Three times a day (TID) | ORAL | 0 refills | Status: DC | PRN
Start: 2020-12-27 — End: 2021-03-14

## 2020-12-27 MED ORDER — ARIPIPRAZOLE 20 MG PO TABS: 20.0000 mg | ORAL_TABLET | Freq: Every day | ORAL | 2 refills | Status: DC

## 2020-12-27 NOTE — Progress Notes (Signed)
Virtual Visit via Telephone Note  I connected with Penny Porter on 12/27/20 at  2:15 PM EDT by telephone and verified that I am speaking with the correct person using two identifiers.  Location: Patient: home Provider: office   I discussed the limitations, risks, security and privacy concerns of performing an evaluation and management service by telephone and the availability of in person appointments. I also discussed with the patient that there may be a patient responsible charge related to this service. The patient expressed understanding and agreed to proceed.   History of Present Illness: Pt went to her neurologist and reviewed DAT scan. The results weren't consistent with Parkinson's syndrome. He asked if decreasing the Abilify could be an option. It may help to decrease the tremors. The tremors are in both hands and are pretty much constant. Sometimes it shakes so much it gets on her nerves. She has some good days where the tremors are less prominent or stop for a little while. We discussed the risk of going down on Abilify- risk of anxiety, depression and tics getting worse. Currently her depression is fair. Some days she is more down due to back pain. Her tics are ok and she hasn't noticed any in her arms due to the shaking. Her abdominal tics are ongoing. Her anxiety is ok. Her sleep is fair as she is able to sleep thru most the of the night. She denies SI/HI.    Observations/Objective:  General Appearance: unable to assess  Eye Contact:  unable to assess  Speech:  Clear and Coherent and Normal Rate  Volume:  Normal  Mood:  Euthymic  Affect:  Depressed  Thought Process:  Goal Directed, Linear and Descriptions of Associations: Intact  Orientation:  Full (Time, Place, and Person)  Thought Content:  Logical  Suicidal Thoughts:  No  Homicidal Thoughts:  No  Memory:  Immediate;   Good  Judgement:  Good  Insight:  Good  Psychomotor Activity: unable to assess  Concentration:   Concentration: Good  Recall:  Good  Fund of Knowledge:  Good  Language:  Good  Akathisia:  unable to assess  Handed:  Right  AIMS (if indicated):     Assets:  Communication Skills Desire for Improvement Financial Resources/Insurance Housing Leisure Time Resilience Social Support Talents/Skills Transportation Vocational/Educational  ADL's:  unable to assess  Cognition:  WNL  Sleep:         Assessment and Plan: 1. Major depressive disorder, recurrent, severe without psychotic features ([place]) - ARIPiprazole (ABILIFY) 20 MG tablet; Take 1 tablet (20 mg total) by mouth daily.  Dispense: 30 tablet; Refill: 2  2. Tourette's disease - ARIPiprazole (ABILIFY) 20 MG tablet; Take 1 tablet (20 mg total) by mouth daily.  Dispense: 30 tablet; Refill: 2  3. GAD (generalized anxiety disorder) - hydrOXYzine (ATARAX/VISTARIL) 10 MG tablet; Take 1 tablet (10 mg total) by mouth 3 (three) times daily as needed for anxiety.  Dispense: 270 tablet; Refill: 0    Follow Up Instructions: In 3 mo or sooner if needed   I discussed the assessment and treatment plan with the patient. The patient was provided an opportunity to ask questions and all were answered. The patient agreed with the plan and demonstrated an understanding of the instructions.   The patient was advised to call back or seek an in-person evaluation if the symptoms worsen or if the condition fails to improve as anticipated.  I provided 9 minutes of non-face-to-face time during this encounter.   [employer]  Doyne Keel, MD

## 2020-12-28 DIAGNOSIS — I493 Ventricular premature depolarization: Secondary | ICD-10-CM | POA: Diagnosis not present

## 2020-12-28 DIAGNOSIS — F332 Major depressive disorder, recurrent severe without psychotic features: Secondary | ICD-10-CM | POA: Diagnosis not present

## 2020-12-28 DIAGNOSIS — I10 Essential (primary) hypertension: Secondary | ICD-10-CM | POA: Diagnosis not present

## 2020-12-28 DIAGNOSIS — J4540 Moderate persistent asthma, uncomplicated: Secondary | ICD-10-CM | POA: Diagnosis not present

## 2020-12-28 DIAGNOSIS — G4733 Obstructive sleep apnea (adult) (pediatric): Secondary | ICD-10-CM | POA: Diagnosis not present

## 2020-12-28 DIAGNOSIS — F411 Generalized anxiety disorder: Secondary | ICD-10-CM | POA: Diagnosis not present

## 2021-01-12 ENCOUNTER — Other Ambulatory Visit: Payer: Self-pay | Admitting: Allergy and Immunology

## 2021-01-16 DIAGNOSIS — E042 Nontoxic multinodular goiter: Secondary | ICD-10-CM | POA: Diagnosis not present

## 2021-01-19 ENCOUNTER — Other Ambulatory Visit (HOSPITAL_COMMUNITY): Payer: Self-pay | Admitting: Psychiatry

## 2021-01-19 DIAGNOSIS — F952 Tourette's disorder: Secondary | ICD-10-CM

## 2021-01-19 DIAGNOSIS — F332 Major depressive disorder, recurrent severe without psychotic features: Secondary | ICD-10-CM

## 2021-01-21 ENCOUNTER — Other Ambulatory Visit: Payer: Self-pay | Admitting: Allergy and Immunology

## 2021-01-23 DIAGNOSIS — J0100 Acute maxillary sinusitis, unspecified: Secondary | ICD-10-CM | POA: Diagnosis not present

## 2021-02-08 ENCOUNTER — Other Ambulatory Visit: Payer: Self-pay | Admitting: Allergy and Immunology

## 2021-02-14 DIAGNOSIS — I739 Peripheral vascular disease, unspecified: Secondary | ICD-10-CM | POA: Diagnosis not present

## 2021-02-14 DIAGNOSIS — R7303 Prediabetes: Secondary | ICD-10-CM | POA: Diagnosis not present

## 2021-02-14 DIAGNOSIS — R5381 Other malaise: Secondary | ICD-10-CM | POA: Diagnosis not present

## 2021-02-14 DIAGNOSIS — F332 Major depressive disorder, recurrent severe without psychotic features: Secondary | ICD-10-CM | POA: Diagnosis not present

## 2021-02-14 DIAGNOSIS — Z0000 Encounter for general adult medical examination without abnormal findings: Secondary | ICD-10-CM | POA: Diagnosis not present

## 2021-02-14 DIAGNOSIS — R5383 Other fatigue: Secondary | ICD-10-CM | POA: Diagnosis not present

## 2021-02-14 DIAGNOSIS — M792 Neuralgia and neuritis, unspecified: Secondary | ICD-10-CM | POA: Diagnosis not present

## 2021-02-14 DIAGNOSIS — N1831 Chronic kidney disease, stage 3a: Secondary | ICD-10-CM | POA: Diagnosis not present

## 2021-02-14 DIAGNOSIS — I491 Atrial premature depolarization: Secondary | ICD-10-CM | POA: Diagnosis not present

## 2021-02-14 DIAGNOSIS — J4540 Moderate persistent asthma, uncomplicated: Secondary | ICD-10-CM | POA: Diagnosis not present

## 2021-02-14 DIAGNOSIS — I493 Ventricular premature depolarization: Secondary | ICD-10-CM | POA: Diagnosis not present

## 2021-02-14 DIAGNOSIS — K219 Gastro-esophageal reflux disease without esophagitis: Secondary | ICD-10-CM | POA: Diagnosis not present

## 2021-02-14 DIAGNOSIS — I10 Essential (primary) hypertension: Secondary | ICD-10-CM | POA: Diagnosis not present

## 2021-02-14 DIAGNOSIS — F419 Anxiety disorder, unspecified: Secondary | ICD-10-CM | POA: Diagnosis not present

## 2021-02-14 DIAGNOSIS — I129 Hypertensive chronic kidney disease with stage 1 through stage 4 chronic kidney disease, or unspecified chronic kidney disease: Secondary | ICD-10-CM | POA: Diagnosis not present

## 2021-02-14 DIAGNOSIS — E042 Nontoxic multinodular goiter: Secondary | ICD-10-CM | POA: Diagnosis not present

## 2021-02-14 DIAGNOSIS — F952 Tourette's disorder: Secondary | ICD-10-CM | POA: Diagnosis not present

## 2021-02-20 DIAGNOSIS — G4733 Obstructive sleep apnea (adult) (pediatric): Secondary | ICD-10-CM | POA: Diagnosis not present

## 2021-02-20 DIAGNOSIS — I493 Ventricular premature depolarization: Secondary | ICD-10-CM | POA: Diagnosis not present

## 2021-02-20 DIAGNOSIS — R000 Tachycardia, unspecified: Secondary | ICD-10-CM | POA: Diagnosis not present

## 2021-02-20 DIAGNOSIS — R0989 Other specified symptoms and signs involving the circulatory and respiratory systems: Secondary | ICD-10-CM | POA: Diagnosis not present

## 2021-02-20 DIAGNOSIS — G894 Chronic pain syndrome: Secondary | ICD-10-CM | POA: Diagnosis not present

## 2021-02-20 DIAGNOSIS — I10 Essential (primary) hypertension: Secondary | ICD-10-CM | POA: Diagnosis not present

## 2021-02-20 DIAGNOSIS — I878 Other specified disorders of veins: Secondary | ICD-10-CM | POA: Diagnosis not present

## 2021-02-20 DIAGNOSIS — F419 Anxiety disorder, unspecified: Secondary | ICD-10-CM | POA: Diagnosis not present

## 2021-02-25 DIAGNOSIS — L608 Other nail disorders: Secondary | ICD-10-CM | POA: Diagnosis not present

## 2021-02-25 DIAGNOSIS — M2042 Other hammer toe(s) (acquired), left foot: Secondary | ICD-10-CM | POA: Diagnosis not present

## 2021-02-25 DIAGNOSIS — M2041 Other hammer toe(s) (acquired), right foot: Secondary | ICD-10-CM | POA: Diagnosis not present

## 2021-02-25 DIAGNOSIS — I739 Peripheral vascular disease, unspecified: Secondary | ICD-10-CM | POA: Diagnosis not present

## 2021-02-27 DIAGNOSIS — F332 Major depressive disorder, recurrent severe without psychotic features: Secondary | ICD-10-CM | POA: Diagnosis not present

## 2021-02-27 DIAGNOSIS — F411 Generalized anxiety disorder: Secondary | ICD-10-CM | POA: Diagnosis not present

## 2021-03-01 DIAGNOSIS — R079 Chest pain, unspecified: Secondary | ICD-10-CM | POA: Diagnosis not present

## 2021-03-01 DIAGNOSIS — J45909 Unspecified asthma, uncomplicated: Secondary | ICD-10-CM | POA: Diagnosis not present

## 2021-03-01 DIAGNOSIS — R0789 Other chest pain: Secondary | ICD-10-CM | POA: Diagnosis not present

## 2021-03-01 DIAGNOSIS — E785 Hyperlipidemia, unspecified: Secondary | ICD-10-CM | POA: Diagnosis not present

## 2021-03-01 DIAGNOSIS — I10 Essential (primary) hypertension: Secondary | ICD-10-CM | POA: Diagnosis not present

## 2021-03-07 DIAGNOSIS — R0789 Other chest pain: Secondary | ICD-10-CM | POA: Diagnosis not present

## 2021-03-07 DIAGNOSIS — R5383 Other fatigue: Secondary | ICD-10-CM | POA: Diagnosis not present

## 2021-03-07 DIAGNOSIS — G894 Chronic pain syndrome: Secondary | ICD-10-CM | POA: Diagnosis not present

## 2021-03-07 DIAGNOSIS — R5381 Other malaise: Secondary | ICD-10-CM | POA: Diagnosis not present

## 2021-03-07 DIAGNOSIS — I10 Essential (primary) hypertension: Secondary | ICD-10-CM | POA: Diagnosis not present

## 2021-03-14 ENCOUNTER — Other Ambulatory Visit: Payer: Self-pay

## 2021-03-14 ENCOUNTER — Telehealth (INDEPENDENT_AMBULATORY_CARE_PROVIDER_SITE_OTHER): Payer: PPO | Admitting: Psychiatry

## 2021-03-14 DIAGNOSIS — F332 Major depressive disorder, recurrent severe without psychotic features: Secondary | ICD-10-CM | POA: Diagnosis not present

## 2021-03-14 DIAGNOSIS — G2401 Drug induced subacute dyskinesia: Secondary | ICD-10-CM | POA: Diagnosis not present

## 2021-03-14 DIAGNOSIS — F952 Tourette's disorder: Secondary | ICD-10-CM

## 2021-03-14 DIAGNOSIS — F411 Generalized anxiety disorder: Secondary | ICD-10-CM

## 2021-03-14 MED ORDER — HYDROXYZINE HCL 10 MG PO TABS: 10.0000 mg | ORAL_TABLET | Freq: Three times a day (TID) | ORAL | 0 refills | Status: DC | PRN

## 2021-03-14 MED ORDER — ARIPIPRAZOLE 30 MG PO TABS
30.0000 mg | ORAL_TABLET | Freq: Every day | ORAL | 0 refills | Status: DC
Start: 2021-03-14 — End: 2021-05-09

## 2021-03-14 MED ORDER — VALBENAZINE TOSYLATE 40 MG PO CAPS
40.0000 mg | ORAL_CAPSULE | Freq: Every day | ORAL | 1 refills | Status: DC
Start: 2021-03-14 — End: 2021-03-28

## 2021-03-14 MED ORDER — ARIPIPRAZOLE 30 MG PO TABS: 30.0000 mg | ORAL_TABLET | Freq: Every day | ORAL | 0 refills | Status: DC

## 2021-03-14 MED ORDER — BUPROPION HCL ER (XL) 300 MG PO TB24
ORAL_TABLET | ORAL | 0 refills | Status: DC
Start: 2021-03-14 — End: 2021-05-09

## 2021-03-14 NOTE — Progress Notes (Signed)
Virtual Visit via Telephone Note  I connected with [name] on 03/14/21 at 10:00 AM EDT by telephone and verified that I am speaking with the correct person using two identifiers. We were unable to connect by video.   Location: Patient: home Provider: office   I discussed the limitations, risks, security and privacy concerns of performing an evaluation and management service by telephone and the availability of in person appointments. I also discussed with the patient that there may be a patient responsible charge related to this service. The patient expressed understanding and agreed to proceed.   History of Present Illness: Dennie shares that her tremors have been worse. She would like to go back up on Abilify to '30mg'$  because she notes no change in her mood. She has no change in tics but did experience in tics when she went to the ED  and thinks it may be due to stress. Lauralynn went to the ED due to chest pain and all her tests were negative. She is following up with a cardiologist for a stress test this Friday. She is also having issues with her BP and is working with her PCP.  Her anxiety is fair. She is worried about her brother who has cancer and her own medical problems. Takeela spends about 1-2 hrs worrying thru out the day. She denies insomnia and sleeps well most nights. Randomly she has broken sleep. She denies headaches, poor appetite or irritability due to anxiety. Kameo states her mood is stable and she does not notice her depression much. She denies SI/HI. Marland Kitchen     Observations/Objective:  General Appearance: unable to assess  Eye Contact:  unable to assess  Speech:  Clear and Coherent and Normal Rate  Volume:  Normal  Mood:  Anxious and Depressed  Affect:  Full Range  Thought Process:  Goal Directed, Linear, and Descriptions of Associations: Intact  Orientation:  Full (Time, Place, and Person)  Thought Content:  Logical  Suicidal Thoughts:  No  Homicidal Thoughts:  No  Memory:   Immediate;   Good  Judgement:  Good  Insight:  Good  Psychomotor Activity: unable to assess  Concentration:  Concentration: Good  Recall:  Good  Fund of Knowledge:  Good  Language:  Good  Akathisia:  unable to assess  Handed:  unable to assess  AIMS (if indicated):     Assets:  Communication Skills Desire for Improvement Financial Resources/Insurance Housing Resilience Social Support Talents/Skills Transportation Vocational/Educational  ADL's:  unable to assess  Cognition:  WNL  Sleep:        Assessment and Plan: - start trial of Ingrezza for TD  Previous trials cabidopa-levedopa, benztropine  1. Major depressive disorder, recurrent, severe without psychotic features ([place]) - buPROPion (WELLBUTRIN XL) 300 MG 24 hr tablet; TAKE 1 TABLET BY MOUTH EVERY DAY IN THE MORNING  Dispense: 90 tablet; Refill: 0 - ARIPiprazole (ABILIFY) 30 MG tablet; Take 1 tablet (30 mg total) by mouth daily.  Dispense: 90 tablet; Refill: 0  2. Tourette's disease - ARIPiprazole (ABILIFY) 30 MG tablet; Take 1 tablet (30 mg total) by mouth daily.  Dispense: 90 tablet; Refill: 0  3. GAD (generalized anxiety disorder) - hydrOXYzine (ATARAX/VISTARIL) 10 MG tablet; Take 1 tablet (10 mg total) by mouth 3 (three) times daily as needed for anxiety.  Dispense: 270 tablet; Refill: 0  4. Tardive dyskinesia - valbenazine (INGREZZA) 40 MG capsule; Take 1 capsule (40 mg total) by mouth daily.  Dispense: 30 capsule; Refill: 1  Follow Up Instructions: In 2-3 months or sooner if needed   I discussed the assessment and treatment plan with the patient. The patient was provided an opportunity to ask questions and all were answered. The patient agreed with the plan and demonstrated an understanding of the instructions.   The patient was advised to call back or seek an in-person evaluation if the symptoms worsen or if the condition fails to improve as anticipated.  I provided 19 minutes of non-face-to-face time  during this encounter.   Charlcie Cradle, MD

## 2021-03-15 DIAGNOSIS — R0789 Other chest pain: Secondary | ICD-10-CM | POA: Diagnosis not present

## 2021-03-15 DIAGNOSIS — I10 Essential (primary) hypertension: Secondary | ICD-10-CM | POA: Diagnosis not present

## 2021-03-15 DIAGNOSIS — J4540 Moderate persistent asthma, uncomplicated: Secondary | ICD-10-CM | POA: Diagnosis not present

## 2021-03-15 DIAGNOSIS — I493 Ventricular premature depolarization: Secondary | ICD-10-CM | POA: Diagnosis not present

## 2021-03-15 DIAGNOSIS — G4733 Obstructive sleep apnea (adult) (pediatric): Secondary | ICD-10-CM | POA: Diagnosis not present

## 2021-03-25 DIAGNOSIS — M7062 Trochanteric bursitis, left hip: Secondary | ICD-10-CM | POA: Diagnosis not present

## 2021-03-26 DIAGNOSIS — I10 Essential (primary) hypertension: Secondary | ICD-10-CM | POA: Diagnosis not present

## 2021-03-28 ENCOUNTER — Other Ambulatory Visit (HOSPITAL_COMMUNITY): Payer: Self-pay | Admitting: *Deleted

## 2021-03-28 DIAGNOSIS — G2401 Drug induced subacute dyskinesia: Secondary | ICD-10-CM

## 2021-03-28 MED ORDER — VALBENAZINE TOSYLATE 40 MG PO CAPS: 40.0000 mg | ORAL_CAPSULE | Freq: Every day | ORAL | 0 refills | Status: DC

## 2021-04-01 DIAGNOSIS — M1611 Unilateral primary osteoarthritis, right hip: Secondary | ICD-10-CM | POA: Diagnosis not present

## 2021-04-02 ENCOUNTER — Telehealth (HOSPITAL_COMMUNITY): Payer: Self-pay

## 2021-04-02 DIAGNOSIS — F332 Major depressive disorder, recurrent severe without psychotic features: Secondary | ICD-10-CM | POA: Diagnosis not present

## 2021-04-02 DIAGNOSIS — F411 Generalized anxiety disorder: Secondary | ICD-10-CM | POA: Diagnosis not present

## 2021-04-02 NOTE — Telephone Encounter (Signed)
[employer] PRESCRIPTION COVERAGE DENIED  INGREZZA '40MG'$  CAPSULE AS OF 03/27/21  APPEAL DONE & SUBMITTED FOR REVIEW ON 04/02/21. APPROVED  [facility] '40MG'$  CAPSULE REFERENCE # [id number] EFFECTIVE 04/02/2021 THROUGH 08/10/2021  S/W DELONDA  NOTIFIED PHARMACY/PT

## 2021-04-05 IMAGING — NM NM DATSCAN
2 series · 24 of 30 positions shown · non-contrast
Comparison: None.

CLINICAL DATA: 54-year-old male with bilateral hand tremors. Worse
tremors on the RIGHT. Memory deficits.

EXAM:
NUCLEAR MEDICINE BRAIN IMAGING WITH SPECT  (DaTscan )
TECHNIQUE: SPECT images of the brain were obtained after intravenous injection
of radiopharmaceutical. 4 hour post injection imaging. Appropriate
positioning. 130 mg i-STAT given orally for thyroid blockade.
RADIOPHARMACEUTICALS:  5.1 millicuries I 123 Ioflupane

[Series 1: brain spect · 4.14mm/px · 5 of 120 frames shown]
[frame 11/120  full-range]
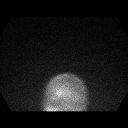
[frame 31/120  full-range]
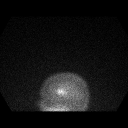
[frame 71/120  full-range]
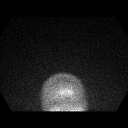
[frame 91/120  full-range]
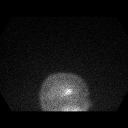
[frame 111/120  full-range]
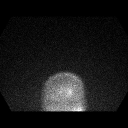

[Series 1016: mpr (id) range · 0.90mm/px · 19 of 33 slices shown]
[im 1/33]
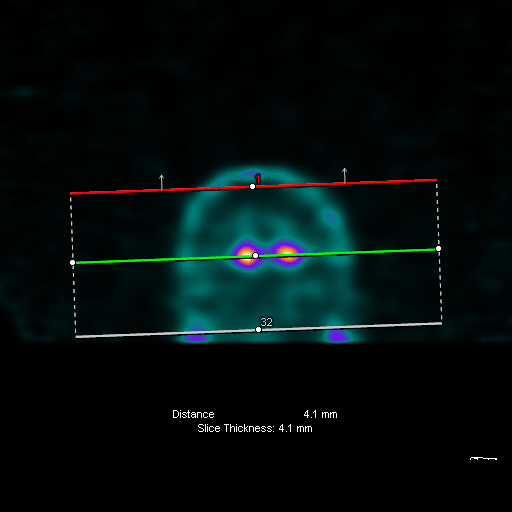
[im 3/33]
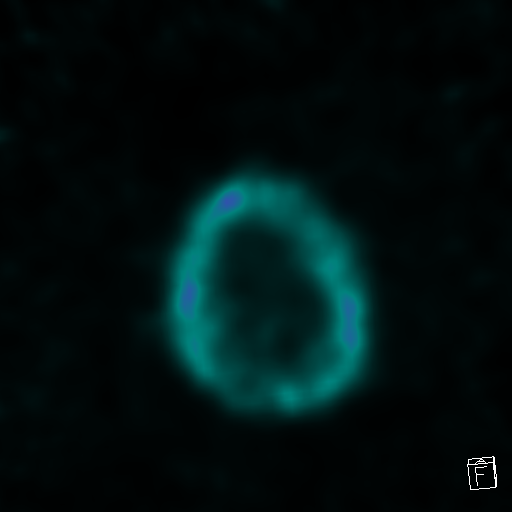
[im 5/33]
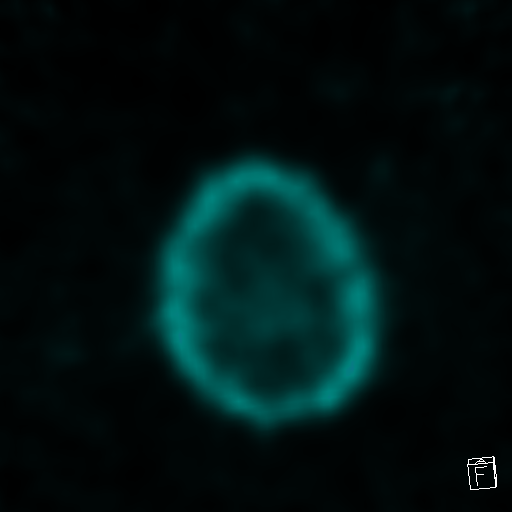
[im 6/33]
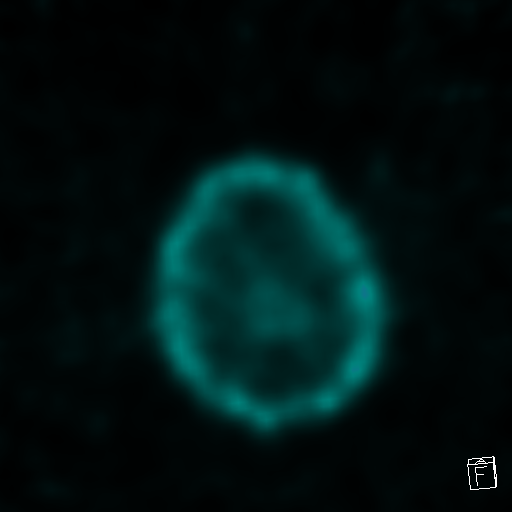
[im 7/33]
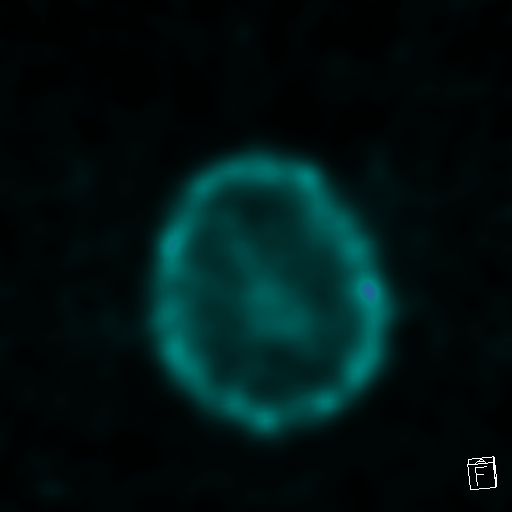
[im 10/33]
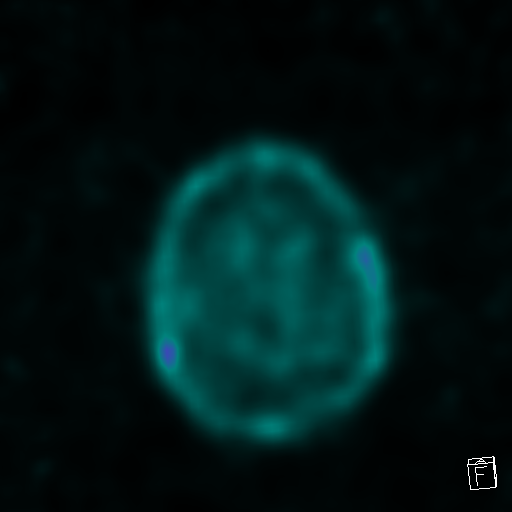
[im 12/33]
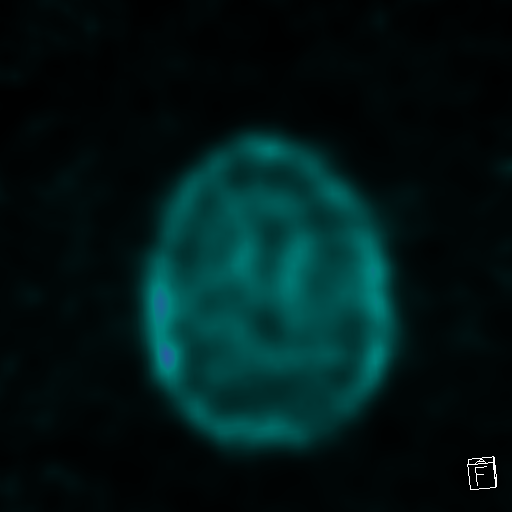
[im 13/33]
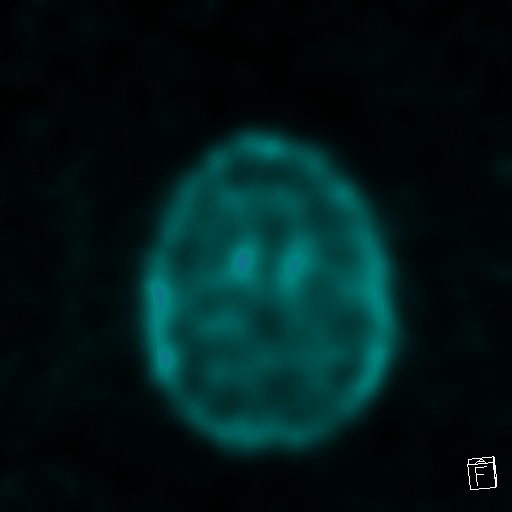
[im 14/33]
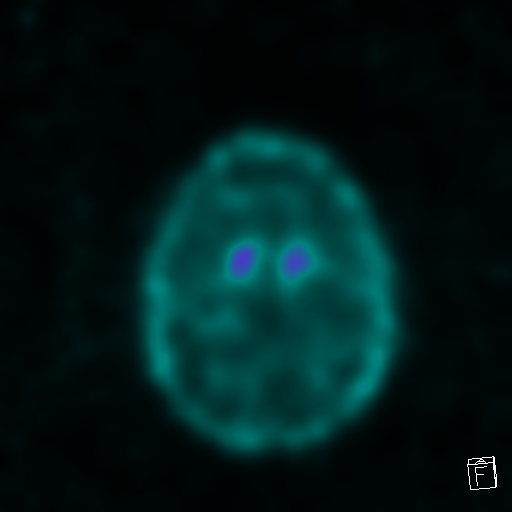
[im 17/33]
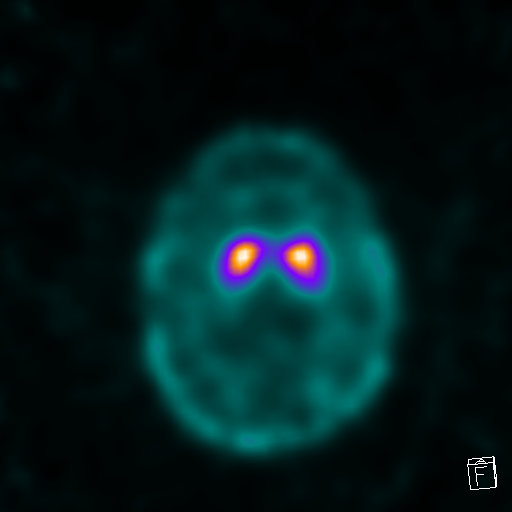
[im 19/33]
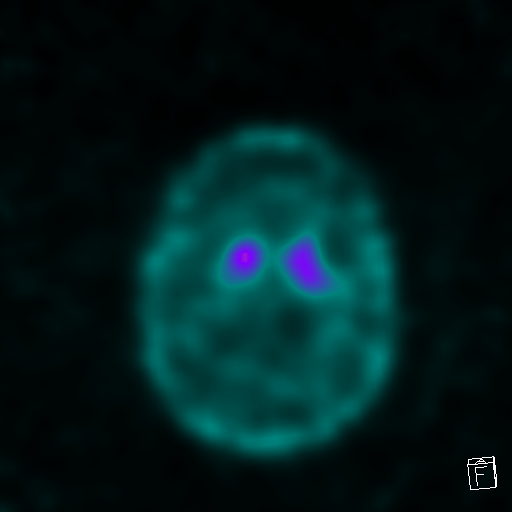
[im 20/33]
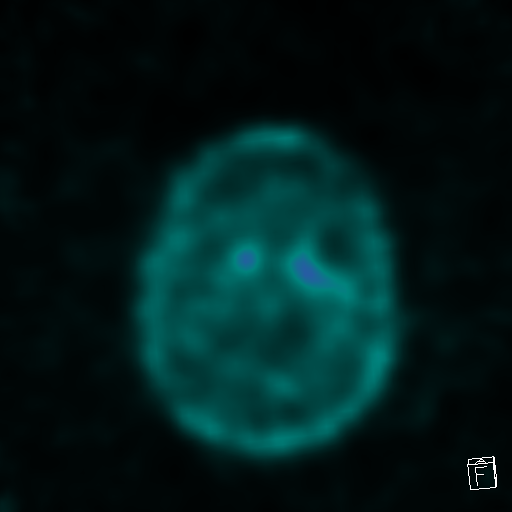
[im 21/33]
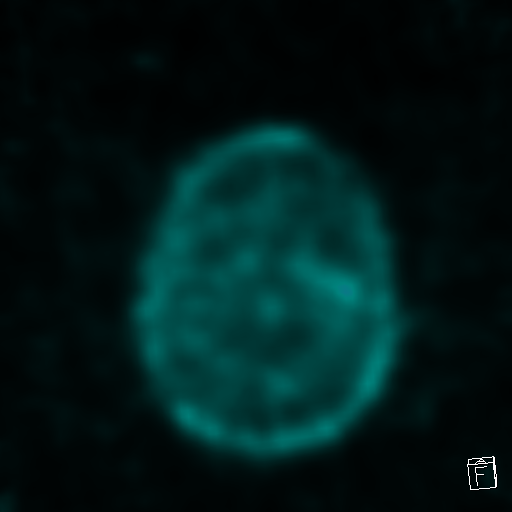
[im 24/33]
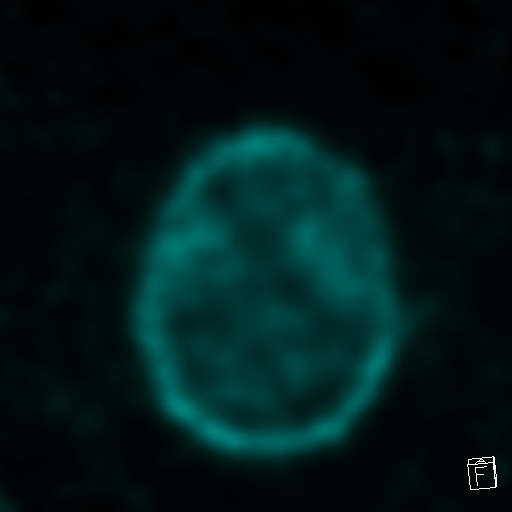
[im 26/33]
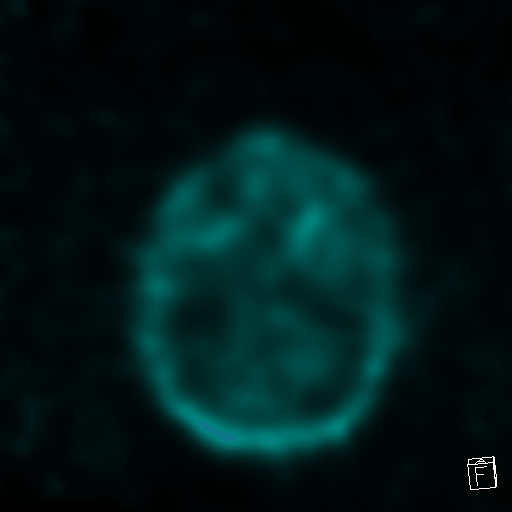
[im 27/33]
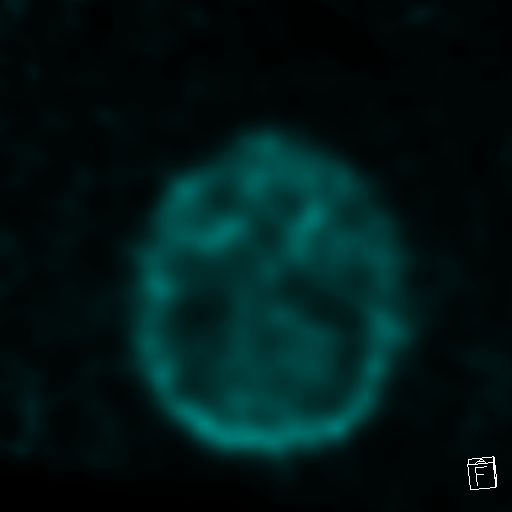
[im 28/33]
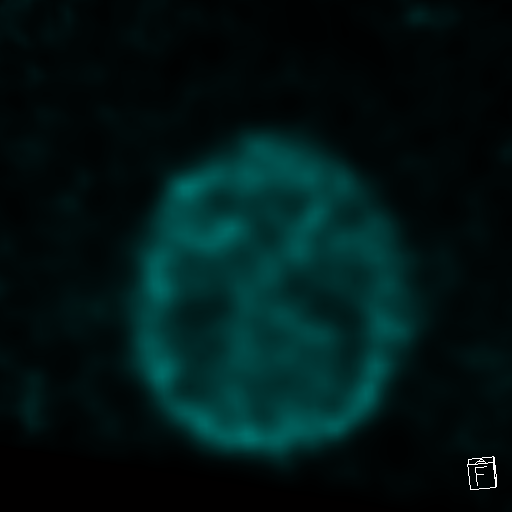
[im 31/33]
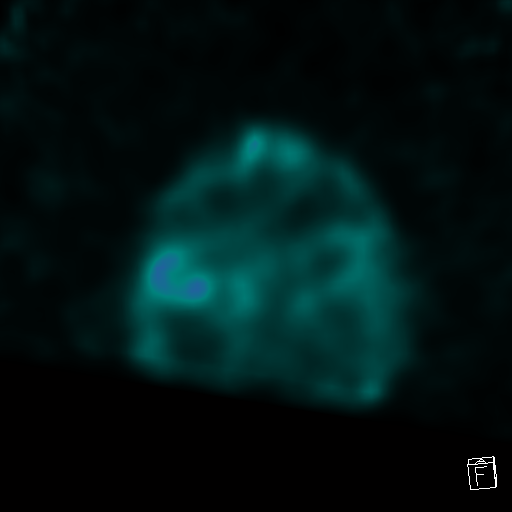
[im 33/33]
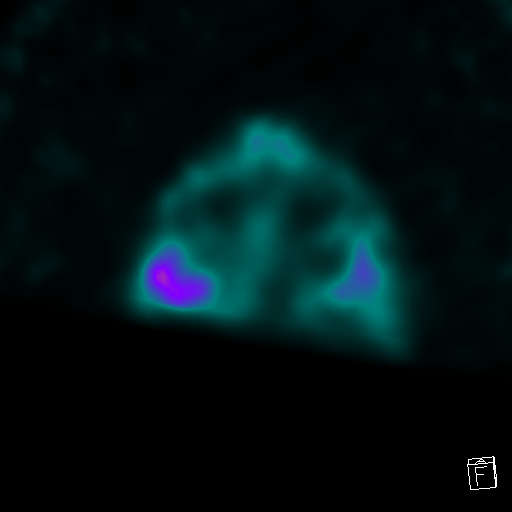

[24 of 30 positions shown; findings below may reference images not displayed]

FINDINGS: Symmetric intense uptake within LEFT and RIGHT striata. The heads of
the caudate nuclei and the posterior striata (putamen) are normal
shape. No evidence of loss of dopamine transport populations in the
basal ganglia.
IMPRESSION: Normal Ioflupane scan. No reduced radiotracer activity in basal
ganglia to suggest Parkinson's syndrome pathology.

Of note, DaTSCAN is not diagnostic of Parkinsonian syndromes, which
remains a clinical diagnosis. DaTscan is an adjuvant test to aid in
the clinical diagnosis of Parkinsonian syndromes.

## 2021-04-08 DIAGNOSIS — M1611 Unilateral primary osteoarthritis, right hip: Secondary | ICD-10-CM | POA: Diagnosis not present

## 2021-04-08 DIAGNOSIS — M25551 Pain in right hip: Secondary | ICD-10-CM | POA: Diagnosis not present

## 2021-04-12 DIAGNOSIS — R002 Palpitations: Secondary | ICD-10-CM | POA: Diagnosis not present

## 2021-04-17 ENCOUNTER — Telehealth (HOSPITAL_COMMUNITY): Payer: Self-pay | Admitting: *Deleted

## 2021-04-17 NOTE — Telephone Encounter (Signed)
Pt called with c/o that the Ingrezza in combination may be too sedating for her. Pt states that her pulse has been running in the "low 40's" since starting Ingrezza. Pt did say that cardiology had increased one of her medications recently (beta blocker?). Pt describes lethargy as well. Pt spoke with pharmacist who advised to d/c Ingrezza until speaking with psychiatrist. Penny Porter has an appointment upcoming on 05/09/21. Please review. Thanks.

## 2021-04-19 DIAGNOSIS — M2041 Other hammer toe(s) (acquired), right foot: Secondary | ICD-10-CM | POA: Diagnosis not present

## 2021-04-19 DIAGNOSIS — M792 Neuralgia and neuritis, unspecified: Secondary | ICD-10-CM | POA: Diagnosis not present

## 2021-04-19 DIAGNOSIS — M2042 Other hammer toe(s) (acquired), left foot: Secondary | ICD-10-CM | POA: Diagnosis not present

## 2021-04-19 DIAGNOSIS — L84 Corns and callosities: Secondary | ICD-10-CM | POA: Diagnosis not present

## 2021-04-19 DIAGNOSIS — I739 Peripheral vascular disease, unspecified: Secondary | ICD-10-CM | POA: Diagnosis not present

## 2021-04-22 DIAGNOSIS — R102 Pelvic and perineal pain: Secondary | ICD-10-CM | POA: Diagnosis not present

## 2021-04-24 DIAGNOSIS — M7989 Other specified soft tissue disorders: Secondary | ICD-10-CM | POA: Diagnosis not present

## 2021-04-24 DIAGNOSIS — I10 Essential (primary) hypertension: Secondary | ICD-10-CM | POA: Diagnosis not present

## 2021-04-24 DIAGNOSIS — G894 Chronic pain syndrome: Secondary | ICD-10-CM | POA: Diagnosis not present

## 2021-04-24 DIAGNOSIS — M549 Dorsalgia, unspecified: Secondary | ICD-10-CM | POA: Diagnosis not present

## 2021-04-25 DIAGNOSIS — R102 Pelvic and perineal pain: Secondary | ICD-10-CM | POA: Diagnosis not present

## 2021-04-26 DIAGNOSIS — M2041 Other hammer toe(s) (acquired), right foot: Secondary | ICD-10-CM | POA: Diagnosis not present

## 2021-04-26 DIAGNOSIS — M2042 Other hammer toe(s) (acquired), left foot: Secondary | ICD-10-CM | POA: Diagnosis not present

## 2021-04-26 DIAGNOSIS — I739 Peripheral vascular disease, unspecified: Secondary | ICD-10-CM | POA: Diagnosis not present

## 2021-05-01 DIAGNOSIS — R309 Painful micturition, unspecified: Secondary | ICD-10-CM | POA: Diagnosis not present

## 2021-05-01 DIAGNOSIS — I10 Essential (primary) hypertension: Secondary | ICD-10-CM | POA: Diagnosis not present

## 2021-05-07 DIAGNOSIS — F332 Major depressive disorder, recurrent severe without psychotic features: Secondary | ICD-10-CM | POA: Diagnosis not present

## 2021-05-07 DIAGNOSIS — F411 Generalized anxiety disorder: Secondary | ICD-10-CM | POA: Diagnosis not present

## 2021-05-07 DIAGNOSIS — M7061 Trochanteric bursitis, right hip: Secondary | ICD-10-CM | POA: Diagnosis not present

## 2021-05-09 ENCOUNTER — Telehealth (HOSPITAL_BASED_OUTPATIENT_CLINIC_OR_DEPARTMENT_OTHER): Payer: PPO | Admitting: Psychiatry

## 2021-05-09 ENCOUNTER — Other Ambulatory Visit: Payer: Self-pay

## 2021-05-09 DIAGNOSIS — F332 Major depressive disorder, recurrent severe without psychotic features: Secondary | ICD-10-CM | POA: Diagnosis not present

## 2021-05-09 DIAGNOSIS — F952 Tourette's disorder: Secondary | ICD-10-CM | POA: Diagnosis not present

## 2021-05-09 DIAGNOSIS — F411 Generalized anxiety disorder: Secondary | ICD-10-CM

## 2021-05-09 MED ORDER — BUPROPION HCL ER (XL) 300 MG PO TB24: ORAL_TABLET | ORAL | 0 refills | Status: DC

## 2021-05-09 MED ORDER — ARIPIPRAZOLE 30 MG PO TABS: 30.0000 mg | ORAL_TABLET | Freq: Every day | ORAL | 0 refills | Status: DC

## 2021-05-09 MED ORDER — HYDROXYZINE HCL 10 MG PO TABS: 10.0000 mg | ORAL_TABLET | Freq: Three times a day (TID) | ORAL | 0 refills | Status: DC | PRN

## 2021-05-09 NOTE — Progress Notes (Signed)
Virtual Visit via Telephone Note  I connected with Penny Porter on 05/09/21 at  8:30 AM EDT by telephone and verified that I am speaking with the correct person using two identifiers. Penny Porter does not have a computer or smart phone to do a video session.   Location: Patient: home Provider: office   I discussed the limitations, risks, security and privacy concerns of performing an evaluation and management service by telephone and the availability of in person appointments. I also discussed with the patient that there may be a patient responsible charge related to this service. The patient expressed understanding and agreed to proceed.   History of Present Illness: "I'm alright, I guess". She is having severe hip pain on both sides. She is having to get injections to manage the pain. Penny Porter's ability to move around and do things is limited even more now. It makes her feel down. Her BP is elevated when she does even small activities. She is spending most of her time at home due to this. Penny Porter is back to coloring. Her mood is a little down because her best friend is sick and Penny Porter feels like she is losing her and another close friend is also have a decline in her health.  Her brother has cancer and is suffering. She feels worry about all this. Her anxiety is manageable. Overall Penny Porter shares that her depression is ok right now. Her sleep is variable. She sleeps decent and other nights she wakes up multiple times. Her appetite is good. Penny Porter denies SI/HI. Penny Porter was not able to tolerate Ingrezza because it dropped her BP and made her tired. She stopped it after 4 days. Her right and left hand are now shaking. Her right shakes uncontrollably when she is not busy. When she is busy then she doesn't notice it. She doesn't;t want to try another med at this time.    Observations/Objective:  General Appearance: unable to assess  Eye Contact:  unable to assess  Speech:  Clear and Coherent and Normal Rate  Volume:  Normal   Mood:  Anxious and Depressed  Affect:  Full Range  Thought Process:  Goal Directed, Linear, and Descriptions of Associations: Intact  Orientation:  Full (Time, Place, and Person)  Thought Content:  Logical  Suicidal Thoughts:  No  Homicidal Thoughts:  No  Memory:  Immediate;   Good  Judgement:  Good  Insight:  Good  Psychomotor Activity: unable to assess  Concentration:  Concentration: Good  Recall:  Good  Fund of Knowledge:  Good  Language:  Good  Akathisia:  unable to assess  Handed:  unable to assess  AIMS (if indicated):     Assets:  Communication Skills Desire for Improvement Financial Resources/Insurance Housing Resilience Social Support Talents/Skills Transportation Vocational/Educational  ADL's:  unable to assess  Cognition:  WNL  Sleep:        Assessment and Plan: 1. Major depressive disorder, recurrent, severe without psychotic features ([place]) - ARIPiprazole (ABILIFY) 30 MG tablet; Take 1 tablet (30 mg total) by mouth daily.  Dispense: 90 tablet; Refill: 0 - buPROPion (WELLBUTRIN XL) 300 MG 24 hr tablet; TAKE 1 TABLET BY MOUTH EVERY DAY IN THE MORNING  Dispense: 90 tablet; Refill: 0  2. Tourette's disease - ARIPiprazole (ABILIFY) 30 MG tablet; Take 1 tablet (30 mg total) by mouth daily.  Dispense: 90 tablet; Refill: 0  3. GAD (generalized anxiety disorder) - hydrOXYzine (ATARAX/VISTARIL) 10 MG tablet; Take 1 tablet (10 mg total) by mouth 3 (three) times  daily as needed for anxiety.  Dispense: 270 tablet; Refill: 0   Follow Up Instructions: In 3 months or sooner if needed   I discussed the assessment and treatment plan with the patient. The patient was provided an opportunity to ask questions and all were answered. The patient agreed with the plan and demonstrated an understanding of the instructions.   The patient was advised to call back or seek an in-person evaluation if the symptoms worsen or if the condition fails to improve as anticipated.  I  provided 15 minutes of non-face-to-face time during this encounter.   Charlcie Cradle, MD

## 2021-05-14 DIAGNOSIS — N952 Postmenopausal atrophic vaginitis: Secondary | ICD-10-CM | POA: Diagnosis not present

## 2021-05-14 DIAGNOSIS — Z79899 Other long term (current) drug therapy: Secondary | ICD-10-CM | POA: Diagnosis not present

## 2021-05-14 DIAGNOSIS — R3982 Chronic bladder pain: Secondary | ICD-10-CM | POA: Diagnosis not present

## 2021-05-14 DIAGNOSIS — N3941 Urge incontinence: Secondary | ICD-10-CM | POA: Diagnosis not present

## 2021-05-26 ENCOUNTER — Other Ambulatory Visit: Payer: Self-pay | Admitting: Allergy and Immunology

## 2021-06-04 DIAGNOSIS — M5136 Other intervertebral disc degeneration, lumbar region: Secondary | ICD-10-CM | POA: Diagnosis not present

## 2021-06-04 DIAGNOSIS — I10 Essential (primary) hypertension: Secondary | ICD-10-CM | POA: Diagnosis not present

## 2021-06-04 DIAGNOSIS — R040 Epistaxis: Secondary | ICD-10-CM | POA: Diagnosis not present

## 2021-06-04 DIAGNOSIS — M25552 Pain in left hip: Secondary | ICD-10-CM | POA: Diagnosis not present

## 2021-06-04 DIAGNOSIS — M159 Polyosteoarthritis, unspecified: Secondary | ICD-10-CM | POA: Diagnosis not present

## 2021-06-04 DIAGNOSIS — M25551 Pain in right hip: Secondary | ICD-10-CM | POA: Diagnosis not present

## 2021-06-05 DIAGNOSIS — M25551 Pain in right hip: Secondary | ICD-10-CM | POA: Diagnosis not present

## 2021-06-05 DIAGNOSIS — M160 Bilateral primary osteoarthritis of hip: Secondary | ICD-10-CM | POA: Diagnosis not present

## 2021-06-17 DIAGNOSIS — F411 Generalized anxiety disorder: Secondary | ICD-10-CM | POA: Diagnosis not present

## 2021-06-17 DIAGNOSIS — N952 Postmenopausal atrophic vaginitis: Secondary | ICD-10-CM | POA: Diagnosis not present

## 2021-06-17 DIAGNOSIS — F332 Major depressive disorder, recurrent severe without psychotic features: Secondary | ICD-10-CM | POA: Diagnosis not present

## 2021-06-17 DIAGNOSIS — R350 Frequency of micturition: Secondary | ICD-10-CM | POA: Diagnosis not present

## 2021-06-18 DIAGNOSIS — J310 Chronic rhinitis: Secondary | ICD-10-CM | POA: Diagnosis not present

## 2021-06-18 DIAGNOSIS — F952 Tourette's disorder: Secondary | ICD-10-CM | POA: Diagnosis not present

## 2021-06-18 DIAGNOSIS — I878 Other specified disorders of veins: Secondary | ICD-10-CM | POA: Diagnosis not present

## 2021-06-18 DIAGNOSIS — K219 Gastro-esophageal reflux disease without esophagitis: Secondary | ICD-10-CM | POA: Diagnosis not present

## 2021-06-18 DIAGNOSIS — I129 Hypertensive chronic kidney disease with stage 1 through stage 4 chronic kidney disease, or unspecified chronic kidney disease: Secondary | ICD-10-CM | POA: Diagnosis not present

## 2021-06-18 DIAGNOSIS — R5383 Other fatigue: Secondary | ICD-10-CM | POA: Diagnosis not present

## 2021-06-18 DIAGNOSIS — E042 Nontoxic multinodular goiter: Secondary | ICD-10-CM | POA: Diagnosis not present

## 2021-06-18 DIAGNOSIS — M5136 Other intervertebral disc degeneration, lumbar region: Secondary | ICD-10-CM | POA: Diagnosis not present

## 2021-06-18 DIAGNOSIS — M5030 Other cervical disc degeneration, unspecified cervical region: Secondary | ICD-10-CM | POA: Diagnosis not present

## 2021-06-18 DIAGNOSIS — G4733 Obstructive sleep apnea (adult) (pediatric): Secondary | ICD-10-CM | POA: Diagnosis not present

## 2021-06-18 DIAGNOSIS — I739 Peripheral vascular disease, unspecified: Secondary | ICD-10-CM | POA: Diagnosis not present

## 2021-06-18 DIAGNOSIS — E782 Mixed hyperlipidemia: Secondary | ICD-10-CM | POA: Diagnosis not present

## 2021-06-18 DIAGNOSIS — K5909 Other constipation: Secondary | ICD-10-CM | POA: Diagnosis not present

## 2021-06-18 DIAGNOSIS — R5381 Other malaise: Secondary | ICD-10-CM | POA: Diagnosis not present

## 2021-06-18 DIAGNOSIS — J4540 Moderate persistent asthma, uncomplicated: Secondary | ICD-10-CM | POA: Diagnosis not present

## 2021-06-19 DIAGNOSIS — L602 Onychogryphosis: Secondary | ICD-10-CM | POA: Diagnosis not present

## 2021-06-19 DIAGNOSIS — I739 Peripheral vascular disease, unspecified: Secondary | ICD-10-CM | POA: Diagnosis not present

## 2021-06-19 DIAGNOSIS — M2042 Other hammer toe(s) (acquired), left foot: Secondary | ICD-10-CM | POA: Diagnosis not present

## 2021-06-19 DIAGNOSIS — M2041 Other hammer toe(s) (acquired), right foot: Secondary | ICD-10-CM | POA: Diagnosis not present

## 2021-06-21 ENCOUNTER — Other Ambulatory Visit: Payer: Self-pay | Admitting: Allergy and Immunology

## 2021-06-21 DIAGNOSIS — M79672 Pain in left foot: Secondary | ICD-10-CM | POA: Diagnosis not present

## 2021-06-21 DIAGNOSIS — M7672 Peroneal tendinitis, left leg: Secondary | ICD-10-CM | POA: Diagnosis not present

## 2021-06-24 DIAGNOSIS — N952 Postmenopausal atrophic vaginitis: Secondary | ICD-10-CM | POA: Diagnosis not present

## 2021-06-24 DIAGNOSIS — N3941 Urge incontinence: Secondary | ICD-10-CM | POA: Diagnosis not present

## 2021-06-24 DIAGNOSIS — R3982 Chronic bladder pain: Secondary | ICD-10-CM | POA: Diagnosis not present

## 2021-07-04 ENCOUNTER — Other Ambulatory Visit: Payer: Self-pay | Admitting: Allergy and Immunology

## 2021-07-08 NOTE — Telephone Encounter (Signed)
I just wanted to check and see if it is appropriate to refill this medication for the patient?

## 2021-07-10 NOTE — Telephone Encounter (Signed)
I called and spoke to the patient and she stated that it is for Rosacea. She did state that unfortunately she can't remember who first prescribed her the medication.

## 2021-07-11 DIAGNOSIS — M79672 Pain in left foot: Secondary | ICD-10-CM | POA: Diagnosis not present

## 2021-07-11 DIAGNOSIS — M7672 Peroneal tendinitis, left leg: Secondary | ICD-10-CM | POA: Diagnosis not present

## 2021-07-16 DIAGNOSIS — F332 Major depressive disorder, recurrent severe without psychotic features: Secondary | ICD-10-CM | POA: Diagnosis not present

## 2021-07-16 DIAGNOSIS — F411 Generalized anxiety disorder: Secondary | ICD-10-CM | POA: Diagnosis not present

## 2021-07-17 DIAGNOSIS — I493 Ventricular premature depolarization: Secondary | ICD-10-CM | POA: Diagnosis not present

## 2021-07-17 DIAGNOSIS — G894 Chronic pain syndrome: Secondary | ICD-10-CM | POA: Diagnosis not present

## 2021-07-17 DIAGNOSIS — I10 Essential (primary) hypertension: Secondary | ICD-10-CM | POA: Diagnosis not present

## 2021-07-22 DIAGNOSIS — R040 Epistaxis: Secondary | ICD-10-CM | POA: Insufficient documentation

## 2021-07-22 DIAGNOSIS — I10 Essential (primary) hypertension: Secondary | ICD-10-CM | POA: Diagnosis not present

## 2021-07-25 ENCOUNTER — Other Ambulatory Visit: Payer: Self-pay

## 2021-07-25 ENCOUNTER — Telehealth (HOSPITAL_BASED_OUTPATIENT_CLINIC_OR_DEPARTMENT_OTHER): Payer: PPO | Admitting: Psychiatry

## 2021-07-25 DIAGNOSIS — F332 Major depressive disorder, recurrent severe without psychotic features: Secondary | ICD-10-CM | POA: Diagnosis not present

## 2021-07-25 DIAGNOSIS — F952 Tourette's disorder: Secondary | ICD-10-CM

## 2021-07-25 DIAGNOSIS — F411 Generalized anxiety disorder: Secondary | ICD-10-CM | POA: Diagnosis not present

## 2021-07-25 MED ORDER — BUPROPION HCL ER (XL) 300 MG PO TB24: ORAL_TABLET | ORAL | 1 refills | Status: DC

## 2021-07-25 MED ORDER — HYDROXYZINE HCL 10 MG PO TABS: 10.0000 mg | ORAL_TABLET | Freq: Three times a day (TID) | ORAL | 1 refills | Status: DC | PRN

## 2021-07-25 MED ORDER — ARIPIPRAZOLE 30 MG PO TABS: 30.0000 mg | ORAL_TABLET | Freq: Every day | ORAL | 1 refills | Status: DC

## 2021-07-25 NOTE — Progress Notes (Signed)
Virtual Visit via Telephone Note  I connected with Penny Porter on 07/25/21 at 11:00 AM EST by telephone and verified that I am speaking with the correct person using two identifiers. She does not have a way to a video visit.   Location: Patient: home Provider: office   I discussed the limitations, risks, security and privacy concerns of performing an evaluation and management service by telephone and the availability of in person appointments. I also discussed with the patient that there may be a patient responsible charge related to this service. The patient expressed understanding and agreed to proceed.   History of Present Illness: Penny Porter is doing ok. Her pain level has been improving. Her brother is in hospice and she is dealing with it as best as she can. Her depression is mild and manageable. She keeps herself busy and doing hobbies. She is visiting her brother daily. About 2 nights a week she has trouble falling asleep but most nights she sleeps well. Her energy is limited. Appetite is good. She denies hopelessness and isolation. Tanvi denies SI/HI. Her anxiety is "ok" and it is mostly situational. Lameka is able to use coping skills to manage it. The shaking in her hands is bad on some days randomly. She notices her left hand shakes when she is picking something up. Her stomach still jerks when stressed.    Observations/Objective:  General Appearance: unable to assess  Eye Contact:  unable to assess  Speech:  Clear and Coherent  Volume:  Normal  Mood:  Euthymic  Affect:  Full Range  Thought Process:  Goal Directed, Linear, and Descriptions of Associations: Intact  Orientation:  Full (Time, Place, and Person)  Thought Content:  Logical  Suicidal Thoughts:  No  Homicidal Thoughts:  No  Memory:  Immediate;   Good  Judgement:  Good  Insight:  Good  Psychomotor Activity: unable to assess  Concentration:  Concentration: Good  Recall:  Good  Fund of Knowledge:  Good  Language:  Good   Akathisia:  unable to assess  Handed:  unable to assess  AIMS (if indicated):     Assets:  Communication Skills Desire for Improvement Financial Resources/Insurance Housing Leisure Time Resilience Social Support Talents/Skills Transportation Vocational/Educational  ADL's:  unable to assess  Cognition:  WNL  Sleep:        Assessment and Plan: Depression screen [facility] 2/9 07/25/2021 12/13/2020 10/25/2020 10/04/2020 08/30/2018  Decreased Interest 0 1 1 0 0  Down, Depressed, Hopeless 1 2 1 2  0  PHQ - 2 Score 1 3 2 2  0  Altered sleeping - 0 0 1 -  Tired, decreased energy - 3 3 1  -  Change in appetite - 0 0 0 -  Feeling bad or failure about yourself  - 2 3 2  -  Trouble concentrating - 0 0 1 -  Moving slowly or fidgety/restless - 0 0 2 -  Suicidal thoughts - 0 0 0 -  PHQ-9 Score - 8 8 9  -  Difficult doing work/chores - Somewhat difficult - - -    Flowsheet Row Video Visit from 07/25/2021 in [facility] Video Visit from 12/13/2020 in [facility] Video Visit from 10/25/2020 in [place] No Risk No Risk No Risk      -pt will send in copy of EKG done in July 2022  Reviewed labs done 06/18/21 CMP wnl, triglycerides 192 elevated, Hb 16.1 and Hct 46.6. working  with PCP regarding her health issues.   Pt will have Prolactin and HbA1c drawn thru PCP  1. Major depressive disorder, recurrent, severe without psychotic features ([place]) - ARIPiprazole (ABILIFY) 30 MG tablet; Take 1 tablet (30 mg total) by mouth daily.  Dispense: 90 tablet; Refill: 1 - buPROPion (WELLBUTRIN XL) 300 MG 24 hr tablet; TAKE 1 TABLET BY MOUTH EVERY DAY IN THE MORNING  Dispense: 90 tablet; Refill: 1  2. Tourette's disease - ARIPiprazole (ABILIFY) 30 MG tablet; Take 1 tablet (30 mg total) by mouth daily.  Dispense: 90 tablet; Refill: 1  3. GAD (generalized anxiety disorder) -  hydrOXYzine (ATARAX) 10 MG tablet; Take 1 tablet (10 mg total) by mouth 3 (three) times daily as needed for anxiety.  Dispense: 270 tablet; Refill: 1   Follow Up Instructions: In 3-4 months or sooner if needed. Visit will be face to face.    I discussed the assessment and treatment plan with the patient. The patient was provided an opportunity to ask questions and all were answered. The patient agreed with the plan and demonstrated an understanding of the instructions.   The patient was advised to call back or seek an in-person evaluation if the symptoms worsen or if the condition fails to improve as anticipated.  I provided 17 minutes of non-face-to-face time during this encounter.   Charlcie Cradle, MD

## 2021-07-29 DIAGNOSIS — Z5181 Encounter for therapeutic drug level monitoring: Secondary | ICD-10-CM | POA: Diagnosis not present

## 2021-07-30 ENCOUNTER — Telehealth (HOSPITAL_COMMUNITY): Payer: Self-pay | Admitting: *Deleted

## 2021-07-30 NOTE — Telephone Encounter (Signed)
EKG results received form [facility] E.[facility]. Exam date 03/01/21. EKG resulted as normal as read by Dr. Ted Mcalpine, M.D. QT-QTC 366/462 ms. Test to be scanned into pt chart.

## 2021-08-09 DIAGNOSIS — M79674 Pain in right toe(s): Secondary | ICD-10-CM | POA: Insufficient documentation

## 2021-09-17 DIAGNOSIS — G250 Essential tremor: Secondary | ICD-10-CM | POA: Insufficient documentation

## 2021-09-17 DIAGNOSIS — R269 Unspecified abnormalities of gait and mobility: Secondary | ICD-10-CM | POA: Insufficient documentation

## 2021-10-25 DIAGNOSIS — L600 Ingrowing nail: Secondary | ICD-10-CM | POA: Insufficient documentation

## 2021-11-14 ENCOUNTER — Ambulatory Visit (HOSPITAL_COMMUNITY): Payer: PPO | Admitting: Psychiatry

## 2021-11-14 ENCOUNTER — Encounter (HOSPITAL_COMMUNITY): Payer: Self-pay | Admitting: Psychiatry

## 2021-11-14 DIAGNOSIS — F952 Tourette's disorder: Secondary | ICD-10-CM | POA: Diagnosis not present

## 2021-11-14 DIAGNOSIS — F411 Generalized anxiety disorder: Secondary | ICD-10-CM

## 2021-11-14 DIAGNOSIS — F332 Major depressive disorder, recurrent severe without psychotic features: Secondary | ICD-10-CM | POA: Diagnosis not present

## 2021-11-14 MED ORDER — ARIPIPRAZOLE 30 MG PO TABS: 30.0000 mg | ORAL_TABLET | Freq: Every day | ORAL | 1 refills | Status: DC

## 2021-11-14 MED ORDER — BUPROPION HCL ER (XL) 300 MG PO TB24: ORAL_TABLET | ORAL | 1 refills | Status: DC

## 2021-11-14 MED ORDER — HYDROXYZINE HCL 10 MG PO TABS: 10.0000 mg | ORAL_TABLET | Freq: Three times a day (TID) | ORAL | 1 refills | Status: DC | PRN

## 2021-11-14 NOTE — Progress Notes (Signed)
BH MD/PA/NP OP Progress Note ? ?11/14/2021 10:54 AM ?[name]  ?MRN:  [id number] ? ?Chief Complaint:  ?Chief Complaint  ?Patient presents with  ? Follow-up  ? ?HPI: Penny Porter is dealing with a torn calf muscle. She fell on Feb 28. It is very painful and it is taking a lot of time to recover. Penny Porter is walking slow and rarely goes out anymore. Around 2 weeks ago she felt very depressed due to overwhelming pain. She told her sister she wanted to give and was tired of everything. She denies having passive thoughts of death. She denies SI/HI. Since that day her depression has resolved. She does not usually feel depressed or down. She is endorsing some anhedonia due to limited physical mobility. Her sleep is variable. Some nights she sleeps well and other other nights she wakes up multiple times a night. Her energy is low most days. Her appetite is normal but she is still gaining weight. Penny Porter still struggles with anxiety randomly. She is usually pretty calm. She notes random shaking in her arms, hands and legs. It is likely due to parkinsonism likes symptoms.  Penny Porter continues to have tics in her abdomen randomly. The arm tics are infrequent.  ? ? ?Visit Diagnosis:  ?  ICD-10-CM   ?1. Major depressive disorder, recurrent, severe without psychotic features (H[facility])  F33.2 ARIPiprazole (ABILIFY) 30 MG tablet  ?  buPROPion (WELLBUTRIN XL) 300 MG 24 hr tablet  ?  ?2. Tourette's disease  F95.2 ARIPiprazole (ABILIFY) 30 MG tablet  ?  ?3. GAD (generalized anxiety disorder)  F41.1 hydrOXYzine (ATARAX) 10 MG tablet  ?  ? ? ?Past Psychiatric History: pt denies any changes in psych hx ? ?Past Medical History:  ?Past Medical History:  ?Diagnosis Date  ? Anxiety   ? Arthritis   ? Back and hip  ? Asthma   ? Bursitis   ? Complication of anesthesia   ? bladder hard to wake up, never needed a in and out cath  ? Costochondral chest pain   ? Degenerative disk disease   ? Depression   ? Gastroparesis   ? GERD (gastroesophageal reflux disease)   ?  Hypertension   ? Lumbar herniated disc   ? Premature atrial beats 12/2019  ? Sleep apnea   ? not using CPAP  ? Stenosis of lumbosacral spine   ? Stress fracture of foot   ? Thyroid nodule   ? Tourette disease   ? Trigeminal nerve disease   ?  ?Past Surgical History:  ?Procedure Laterality Date  ? BACK SURGERY  September 19, 2015  ? bone spur removal on back    ? CHOLECYSTECTOMY  2005  ? COLONOSCOPY    ? ELBOW SURGERY Left May 29, 2014  ? ESOPHAGOGASTRODUODENOSCOPY    ? KNEE SURGERY  06/2016  ? polyp removal    ? POSTERIOR FUSION LUMBAR SPINE  06/26/2017  ? ruptured disk  2008  ? TEAR DUCT PROBING  2009  ? ? ?Family Psychiatric and Medical  History:  ?Family History  ?Problem Relation Age of Onset  ? Mental retardation Sister   ? Dementia Father   ? Cancer Mother   ? Cancer Brother   ?     colorectal  ? Suicidality Neg Hx   ? ? ?Social History:  ?Social History  ? ?Socioeconomic History  ? Marital status: Single  ?  Spouse name: Not on file  ? Number of children: Not on file  ? Years of education: Not on  file  ? Highest education level: High school graduate  ?Occupational History  ?  Comment: disabled   ?Tobacco Use  ? Smoking status: Never  ? Smokeless tobacco: Never  ?Vaping Use  ? Vaping Use: Never used  ?Substance and Sexual Activity  ? Alcohol use: No  ? Drug use: No  ? Sexual activity: Not Currently  ?Other Topics Concern  ? Not on file  ?Social History Narrative  ? Lives with 2 sisters  ? Caffeine- none  ? ?Social Determinants of Health  ? ?Financial Resource Strain: Not on file  ?Food Insecurity: Not on file  ?Transportation Needs: Not on file  ?Physical Activity: Not on file  ?Stress: Not on file  ?Social Connections: Not on file  ? ? ?Allergies:  ?Allergies  ?Allergen Reactions  ? Amoxicillin Hives and Swelling  ?  LIP SWELLING ?PATIENT HAS HAD A PCN REACTION WITH IMMEDIATE RASH, FACIAL/TONGUE/THROAT SWELLING, SOB, OR LIGHTHEADEDNESS WITH HYPOTENSION:  #  #  #  YES  #  #  #   ?HAS PT DEVELOPED SEVERE RASH  INVOLVING MUCUS MEMBRANES or SKIN NECROSIS: #  #  #  YES  #  #  #  ?Has patient had a PCN reaction that required hospitalization: No ?Has patient had a PCN reaction occurring within the last 10 years: Unknown ?If all of the above answers are "NO", then may proceed with Cephalosporin use. ?  ? Avelox [Moxifloxacin Hcl In Nacl] Other (See Comments)  ?  Caused pain in shoulders to finger, tendonitis  ? Adhesive [Tape] Dermatitis  ?  Blisters skin  ? Bextra [Valdecoxib] Hives and Swelling  ? Doxycycline Other (See Comments)  ? Doxycycline Hyclate Hives and Swelling  ? Gadobutrol Hives  ? Gadolinium Derivatives Hives  ? Gadoversetamide Hives  ? Iodinated Contrast Media Hives  ? Lamictal [Lamotrigine] Hives  ? Naproxen Hypertension  ? Oxycodone-Acetaminophen Other (See Comments)  ? Percolone [Oxycodone] Hives  ? Relafen [Nabumetone] Hypertension  ? Risperdal [Risperidone] Other (See Comments)  ?  Leg weakness ?  ? Nitrofurantoin Other (See Comments)  ? Brintellix [Vortioxetine] Nausea And Vomiting  ? Fetzima [Levomilnacipran] Other (See Comments)  ?  Tremors across head ?  ? Moxifloxacin Other (See Comments)  ?  Caused pain in shoulders to finger, tendonitis  ? Sulfamethoxazole-Trimethoprim Rash  ?  Flushing and rash  ? ? ?Metabolic Disorder Labs: ?No results found for: HGBA1C, MPG ?Lab Results  ?Component Value Date  ? PROLACTIN 13.1 06/09/2017  ? ?No results found for: CHOL, TRIG, HDL, CHOLHDL, VLDL, LDLCALC ?No results found for: TSH ? ?Therapeutic Level Labs: ?No results found for: LITHIUM ?No results found for: VALPROATE ?No components found for:  CBMZ ? ?Current Medications: ?Current Outpatient Medications  ?Medication Sig Dispense Refill  ? Acetaminophen (TYLENOL PO) Take 1 tablet by mouth daily as needed (PAIN).    ? albuterol (PROVENTIL) (2.5 MG/3ML) 0.083% nebulizer solution Can use one vial in nebulizer every four to six hours as needed for cough or wheeze. 180 mL 1  ? allopurinol (ZYLOPRIM) 100 MG tablet Take  by mouth 2 (two) times daily.     ? aspirin 81 MG tablet Take 81 mg by mouth daily.    ? azithromycin (ZITHROMAX) 250 MG tablet TAKE 1 TABLET BY MOUTH THREE TIMES A WEEK. 12 tablet 4  ? Cholecalciferol (VITAMIN D-3) 5000 UNITS TABS Take 5,000 Units by mouth daily.     ? famotidine (PEPCID) 20 MG tablet Take 20 mg by mouth 2 (  two) times daily.    ? flecainide (TAMBOCOR) 50 MG tablet SMARTSIG:1 Tablet(s) By Mouth Every 12 Hours    ? furosemide (LASIX) 20 MG tablet Take 20 mg by mouth daily.    ? ipratropium (ATROVENT) 0.03 % nasal spray USE 2 SPRAYS IN NOSTRIL (S) 2 TIMES DAILY AS DIRECTED  11  ? ketoconazole (NIZORAL) 2 % shampoo Apply 1 application topically as needed for irritation.     ? loratadine (CLARITIN) 10 MG tablet Take 10 mg by mouth at bedtime.     ? losartan (COZAAR) 50 MG tablet Take 50 mg by mouth daily.    ? lubiprostone (AMITIZA) 24 MCG capsule Take 24 mcg by mouth daily as needed for constipation.     ? meclizine (ANTIVERT) 25 MG tablet PLEASE SEE ATTACHED FOR DETAILED DIRECTIONS    ? meloxicam (MOBIC) 7.5 MG tablet Take 7.5 mg by mouth 2 (two) times daily.  0  ? methocarbamol (ROBAXIN) 500 MG tablet Take 500 mg by mouth 4 (four) times daily.    ? metoprolol succinate (TOPROL-XL) 25 MG 24 hr tablet Take by mouth 3 (three) times daily.     ? Mometasone Furoate (ASMANEX HFA) 200 MCG/ACT AERO Inhale two puffs once daily as directed.  Rinse, gargle, and spit after use. 13 g 11  ? montelukast (SINGULAIR) 10 MG tablet Take 10 mg by mouth daily.  5  ? mupirocin ointment (BACTROBAN) 2 % Place 1 application into the nose 2 (two) times daily as needed (IRRITATION).     ? ondansetron (ZOFRAN-ODT) 4 MG disintegrating tablet Take 4 mg by mouth 2 (two) times daily as needed for nausea or vomiting.    ? pregabalin (LYRICA) 75 MG capsule Take 75 mg by mouth 2 (two) times daily. Reported on 10/09/2015    ? PROAIR RESPICLICK 532 (90 Base) MCG/ACT AEPB INHALE TWO DOSES BY MOUTH EVERY FOUR TO SIX HOURS AS NEEDED FOR  COUGH OR WHEEZE. 3 each 2  ? Spacer/Aero-Holding Chambers (AEROCHAMBER PLUS) inhaler Use as instructed with inhaler. 1 each 2  ? SPIRIVA RESPIMAT 1.25 MCG/ACT AERS INHALE TWO PUFFS ONCE DAILY AS DIRECTED. 4 g 5

## 2021-11-21 ENCOUNTER — Other Ambulatory Visit: Payer: Self-pay | Admitting: Allergy and Immunology

## 2021-11-21 NOTE — Telephone Encounter (Signed)
Is this ok to refill for the patient?  ?

## 2021-11-25 NOTE — Telephone Encounter (Signed)
Called and spoke with patient, she confirmed that it is for rosacea. Refills have been sent in.  ?

## 2021-12-26 ENCOUNTER — Ambulatory Visit: Payer: PPO | Admitting: Allergy and Immunology

## 2021-12-26 ENCOUNTER — Encounter: Payer: Self-pay | Admitting: Allergy and Immunology

## 2021-12-26 VITALS — BP 136/88

## 2021-12-26 DIAGNOSIS — J3089 Other allergic rhinitis: Secondary | ICD-10-CM | POA: Diagnosis not present

## 2021-12-26 DIAGNOSIS — J4540 Moderate persistent asthma, uncomplicated: Secondary | ICD-10-CM

## 2021-12-26 DIAGNOSIS — K219 Gastro-esophageal reflux disease without esophagitis: Secondary | ICD-10-CM | POA: Diagnosis not present

## 2021-12-26 NOTE — Progress Notes (Signed)
[facility] [place]   Follow-up Note  Referring Provider: Raina Mina., MD Primary Provider: Raina Mina., MD Date of Office Visit: 12/26/2021  Subjective:   Penny Porter (DOB: 1965-09-10) is a 56 y.o. female who returns to the [facility] [place] on 12/26/2021 in re-evaluation of the following:  HPI: Zayda returns to this clinic in evaluation of asthma, allergic rhinitis, LPR.  I have not seen her in this clinic since 26 Dec 2020.  Her asthma has been under excellent control and she rarely uses a short acting bronchodilator while intermittently using Asmanex and Spiriva.  Her intermittent use sounds as though it is less than on a monthly basis.  If she uses too much Asmanex and Spiriva she does get some raspy voice.  She has not required a systemic steroid to treat an exacerbation of asthma.  Her upper airway issue is under excellent control while using montelukast.  She has not required an antibiotic to treat an episode of sinusitis.  She intermittently and rarely uses some nasal steroid.  Her reflux is under very good control while using famotidine mostly just 1 time per day and occasionally twice a day.  She informs me that she had a left calf muscle tear and hematoma in February 2023 and she is slowly recovering from that issue.  Allergies as of 12/26/2021       Reactions   Amoxicillin Hives, Swelling   LIP SWELLING PATIENT HAS HAD A PCN REACTION WITH IMMEDIATE RASH, FACIAL/TONGUE/THROAT SWELLING, SOB, OR LIGHTHEADEDNESS WITH HYPOTENSION:  #  #  #  YES  #  #  #   HAS PT DEVELOPED SEVERE RASH INVOLVING MUCUS MEMBRANES or SKIN NECROSIS: #  #  #  YES  #  #  #  Has patient had a PCN reaction that required hospitalization: No Has patient had a PCN reaction occurring within the last 10 years: Unknown If all of the above answers are "NO", then may proceed with Cephalosporin use.   Avelox [moxifloxacin Hcl In Nacl] Other (See Comments)    Caused pain in shoulders to finger, tendonitis   Adhesive [tape] Dermatitis   Blisters skin   Bextra [valdecoxib] Hives, Swelling   Doxycycline Other (See Comments)   Doxycycline Hyclate Hives, Swelling   Gadobutrol Hives   Gadolinium Derivatives Hives   Gadoversetamide Hives   Iodinated Contrast Media Hives   Lamictal [lamotrigine] Hives   Naproxen Hypertension   Oxycodone-acetaminophen Other (See Comments)   Percolone [oxycodone] Hives   Relafen [nabumetone] Hypertension   Risperdal [risperidone] Other (See Comments)   Leg weakness   Nitrofurantoin Other (See Comments)   Brintellix [vortioxetine] Nausea And Vomiting   Fetzima [levomilnacipran] Other (See Comments)   Tremors across head   Moxifloxacin Other (See Comments)   Caused pain in shoulders to finger, tendonitis   Sulfamethoxazole-trimethoprim Rash   Flushing and rash        Medication List    AeroChamber Plus inhaler Use as instructed with inhaler.   albuterol (2.5 MG/3ML) 0.083% nebulizer solution Commonly known as: PROVENTIL Can use one vial in nebulizer every four to six hours as needed for cough or wheeze.   ProAir RespiClick 706 (90 Base) MCG/ACT Aepb Generic drug: Albuterol Sulfate INHALE TWO DOSES BY MOUTH EVERY FOUR TO SIX HOURS AS NEEDED FOR COUGH OR WHEEZE.   allopurinol 100 MG tablet Commonly known as: ZYLOPRIM Take by mouth 2 (two) times daily.   ARIPiprazole 30  MG tablet Commonly known as: ABILIFY Take 1 tablet (30 mg total) by mouth daily.   Asmanex HFA 200 MCG/ACT Aero Generic drug: Mometasone Furoate Inhale two puffs once daily as directed.  Rinse, gargle, and spit after use.   aspirin 81 MG tablet Take 81 mg by mouth daily.   azithromycin 250 MG tablet Commonly known as: ZITHROMAX TAKE 1 TABLET BY MOUTH THREE TIMES A WEEK.   BIOTIN PO Take by mouth daily.   buPROPion 300 MG 24 hr tablet Commonly known as: WELLBUTRIN XL TAKE 1 TABLET BY MOUTH EVERY DAY IN THE MORNING    clotrimazole-betamethasone cream Commonly known as: LOTRISONE SMARTSIG:sparingly Topical Twice Daily   estradiol 0.1 MG/GM vaginal cream Commonly known as: ESTRACE PLEASE SEE ATTACHED FOR DETAILED DIRECTIONS   famotidine 20 MG tablet Commonly known as: PEPCID Take 20 mg by mouth 2 (two) times daily.   flecainide 50 MG tablet Commonly known as: TAMBOCOR SMARTSIG:1 Tablet(s) By Mouth Every 12 Hours   furosemide 20 MG tablet Commonly known as: LASIX Take 20 mg by mouth daily.   hydrOXYzine 10 MG tablet Commonly known as: ATARAX Take 1 tablet (10 mg total) by mouth 3 (three) times daily as needed for anxiety.   ipratropium 0.03 % nasal spray Commonly known as: ATROVENT USE 2 SPRAYS IN NOSTRIL (S) 2 TIMES DAILY AS DIRECTED   ketoconazole 2 % shampoo Commonly known as: NIZORAL Apply 1 application topically as needed for irritation.   loratadine 10 MG tablet Commonly known as: CLARITIN Take 10 mg by mouth at bedtime.   losartan 50 MG tablet Commonly known as: COZAAR Take 50 mg by mouth daily.   lubiprostone 24 MCG capsule Commonly known as: AMITIZA Take 24 mcg by mouth daily as needed for constipation.   meclizine 25 MG tablet Commonly known as: ANTIVERT PLEASE SEE ATTACHED FOR DETAILED DIRECTIONS   meloxicam 7.5 MG tablet Commonly known as: MOBIC Take 7.5 mg by mouth 2 (two) times daily.   methocarbamol 500 MG tablet Commonly known as: ROBAXIN Take 500 mg by mouth 4 (four) times daily.   metoprolol succinate 25 MG 24 hr tablet Commonly known as: TOPROL-XL Take by mouth 3 (three) times daily.   montelukast 10 MG tablet Commonly known as: SINGULAIR Take 10 mg by mouth daily.   mupirocin ointment 2 % Commonly known as: BACTROBAN Place 1 application into the nose 2 (two) times daily as needed (IRRITATION).   ondansetron 4 MG disintegrating tablet Commonly known as: ZOFRAN-ODT Take 4 mg by mouth 2 (two) times daily as needed for nausea or vomiting.    pregabalin 75 MG capsule Commonly known as: LYRICA Take 75 mg by mouth 2 (two) times daily. Reported on 10/09/2015   Spiriva Respimat 1.25 MCG/ACT Aers Generic drug: Tiotropium Bromide Monohydrate INHALE TWO PUFFS ONCE DAILY AS DIRECTED.   spironolactone 25 MG tablet Commonly known as: ALDACTONE Take 25 mg by mouth daily.   traMADol 50 MG tablet Commonly known as: ULTRAM Take 50 mg by mouth.   triamcinolone cream 0.1 % Commonly known as: KENALOG Apply 1 application topically 2 (two) times daily as needed (IRRITATION).   triamcinolone ointment 0.5 % Commonly known as: KENALOG Apply 1 application topically 2 (two) times daily as needed (IRRITATION).   TYLENOL PO Take 1 tablet by mouth daily as needed (PAIN).   vitamin B-12 1000 MCG tablet Commonly known as: CYANOCOBALAMIN Take 1,000 mcg by mouth daily.   Vitamin D-3 125 MCG (5000 UT) Tabs Take 5,000 Units by  mouth daily.    Past Medical History:  Diagnosis Date   Anxiety    Arthritis    Back and hip   Asthma    Bursitis    Complication of anesthesia    bladder hard to wake up, never needed a in and out cath   Costochondral chest pain    Degenerative disk disease    Depression    Gastroparesis    GERD (gastroesophageal reflux disease)    Hypertension    Lumbar herniated disc    Premature atrial beats 12/2019   Sleep apnea    not using CPAP   Stenosis of lumbosacral spine    Stress fracture of foot    Thyroid nodule    Tourette disease    Trigeminal nerve disease     Past Surgical History:  Procedure Laterality Date   BACK SURGERY  September 19, 2015   bone spur removal on back     CHOLECYSTECTOMY  2005   COLONOSCOPY     ELBOW SURGERY Left May 29, 2014   ESOPHAGOGASTRODUODENOSCOPY     KNEE SURGERY  06/2016   polyp removal     POSTERIOR FUSION LUMBAR SPINE  06/26/2017   ruptured disk  2008   TEAR DUCT PROBING  2009    Review of systems negative except as noted in HPI / PMHx or noted  below:  Review of Systems  Constitutional: Negative.   HENT: Negative.    Eyes: Negative.   Respiratory: Negative.    Cardiovascular: Negative.   Gastrointestinal: Negative.   Genitourinary: Negative.   Musculoskeletal: Negative.   Skin: Negative.   Neurological: Negative.   Endo/Heme/Allergies: Negative.   Psychiatric/Behavioral: Negative.      Objective:   Vitals:   12/26/21 1024  BP: 136/88          Physical Exam Constitutional:      Appearance: She is not diaphoretic.  HENT:     Head: Normocephalic.     Right Ear: Tympanic membrane, ear canal and external ear normal.     Left Ear: Tympanic membrane, ear canal and external ear normal.     Nose: Nose normal. No mucosal edema or rhinorrhea.     Mouth/Throat:     Pharynx: Uvula midline. No oropharyngeal exudate.  Eyes:     Conjunctiva/sclera: Conjunctivae normal.  Neck:     Thyroid: No thyromegaly.     Trachea: Trachea normal. No tracheal tenderness or tracheal deviation.  Cardiovascular:     Rate and Rhythm: Normal rate and regular rhythm.     Heart sounds: Normal heart sounds, S1 normal and S2 normal. No murmur heard. Pulmonary:     Effort: No respiratory distress.     Breath sounds: Normal breath sounds. No stridor. No wheezing or rales.  Lymphadenopathy:     Head:     Right side of head: No tonsillar adenopathy.     Left side of head: No tonsillar adenopathy.     Cervical: No cervical adenopathy.  Skin:    Findings: No erythema or rash.     Nails: There is no clubbing.  Neurological:     Mental Status: She is alert.    Diagnostics:    Spirometry was performed and demonstrated an FEV1 of 1.57 at 55 % of predicted.  Assessment and Plan:   1. Asthma, moderate persistent, well-controlled   2. Other allergic rhinitis   3. LPRD (laryngopharyngeal reflux disease)     1. During periods of asthma activity:  A.  Asmanex 200 - 2 inhalations 1 time a day with spacer  B. Spiriva 1.25 Respimat -2  inhalations 1 time per day    3. Continue OTC Nasacort one spray each 1 time per day during upper airway symptoms  4. Continue montelukast '10mg'$  one tablet one time per day  5. Continue famotidine 20 mg 1-2 times per day   6. Continue ProAir HFA or similar and antihistamine and nasal ipratropium if needed  7. Return to clinic in 12 months or earlier if problem  Deloyce appears to be doing pretty well on her current plan which is a plan composed of intermittent use of anti-inflammatory agents for both her upper and lower airway in the context of consistent leukotriene modifier use.  As well, her reflux is under good control while using famotidine.  We will now see her back in this clinic in 1 year or earlier if there is a problem.   Allena Katz, MD Allergy / Immunology [place]

## 2021-12-26 NOTE — Patient Instructions (Signed)
  1. During periods of asthma activity:  A. Asmanex 200 - 2 inhalations 1 time a day with spacer  B. Spiriva 1.25 Respimat -2 inhalations 1 time per day    3. Continue OTC Nasacort one spray each 1 time per day during upper airway symptoms  4. Continue montelukast '10mg'$  one tablet one time per day  5. Continue famotidine 20 mg 1-2 times per day   6. Continue ProAir HFA or similar and antihistamine and nasal ipratropium if needed  7. Return to clinic in 12 months or earlier if problem

## 2021-12-30 ENCOUNTER — Encounter: Payer: Self-pay | Admitting: Allergy and Immunology

## 2022-01-03 DIAGNOSIS — S96819A Strain of other specified muscles and tendons at ankle and foot level, unspecified foot, initial encounter: Secondary | ICD-10-CM | POA: Insufficient documentation

## 2022-03-13 ENCOUNTER — Ambulatory Visit (HOSPITAL_COMMUNITY): Payer: PPO | Admitting: Psychiatry

## 2022-03-13 DIAGNOSIS — F332 Major depressive disorder, recurrent severe without psychotic features: Secondary | ICD-10-CM | POA: Diagnosis not present

## 2022-03-13 DIAGNOSIS — F952 Tourette's disorder: Secondary | ICD-10-CM

## 2022-03-13 DIAGNOSIS — F411 Generalized anxiety disorder: Secondary | ICD-10-CM | POA: Diagnosis not present

## 2022-03-13 MED ORDER — BUPROPION HCL ER (XL) 300 MG PO TB24: ORAL_TABLET | ORAL | 0 refills | Status: DC

## 2022-03-13 MED ORDER — ARIPIPRAZOLE 30 MG PO TABS: 30.0000 mg | ORAL_TABLET | Freq: Every day | ORAL | 0 refills | Status: DC

## 2022-03-13 MED ORDER — HYDROXYZINE HCL 10 MG PO TABS
10.0000 mg | ORAL_TABLET | Freq: Three times a day (TID) | ORAL | 0 refills | Status: DC | PRN
Start: 2022-03-13 — End: 2022-09-11

## 2022-03-13 NOTE — Progress Notes (Signed)
[facility] MD/PA/NP OP Progress Note  03/13/2022 2:26 PM Penny Porter  MRN:  [id number]  Chief Complaint:  Chief Complaint  Patient presents with   Follow-up   HPI: Penny Porter is having a lot of leg and back numbness. She denies any recent falls but often feels like her legs are about to give out. Penny Porter is spending most of her time at home. She reports rare depression that occurs a few times a year. It happens when she can't do what she wants to do. She denies anhedonia and has been engaging in her hobbies. Her sleep is good most nights. The last 2 nights she has been waking every 2 nights for unknown reasons. Penny Porter denies SI/HI. Her anxiety is mostly centered on her health. Her arm shakes randomly each day. It comes on randomly and stops on its own. It does not shake if she is doing something. She continues to experience abdominal tics.    Visit Diagnosis:    ICD-10-CM   1. Major depressive disorder, recurrent, severe without psychotic features ([place])  F33.2 ARIPiprazole (ABILIFY) 30 MG tablet    buPROPion (WELLBUTRIN XL) 300 MG 24 hr tablet    2. Tourette's disease  F95.2 ARIPiprazole (ABILIFY) 30 MG tablet    3. GAD (generalized anxiety disorder)  F41.1 hydrOXYzine (ATARAX) 10 MG tablet      Past Psychiatric History: no updates per patient   Past Medical History:  Past Medical History:  Diagnosis Date   Anxiety    Arthritis    Back and hip   Asthma    Bursitis    Complication of anesthesia    bladder hard to wake up, never needed a in and out cath   Costochondral chest pain    Degenerative disk disease    Depression    Gastroparesis    GERD (gastroesophageal reflux disease)    Hypertension    Lumbar herniated disc    Premature atrial beats 12/2019   Sleep apnea    not using CPAP   Stenosis of lumbosacral spine    Stress fracture of foot    Thyroid nodule    Tourette disease    Trigeminal nerve disease     Past Surgical History:  Procedure Laterality Date   BACK SURGERY  September 19, 2015   bone spur removal on back     CHOLECYSTECTOMY  2005   COLONOSCOPY     ELBOW SURGERY Left May 29, 2014   ESOPHAGOGASTRODUODENOSCOPY     KNEE SURGERY  06/2016   polyp removal     POSTERIOR FUSION LUMBAR SPINE  06/26/2017   ruptured disk  2008   TEAR DUCT PROBING  2009    Family Psychiatric and medical History:  Family History  Problem Relation Age of Onset   Mental retardation Sister    Dementia Father    Cancer Mother    Cancer Brother        colorectal   Suicidality Neg Hx     Social History:  Social History   Socioeconomic History   Marital status: Single    Spouse name: Not on file   Number of children: Not on file   Years of education: Not on file   Highest education level: High school graduate  Occupational History    Comment: disabled   Tobacco Use   Smoking status: Never   Smokeless tobacco: Never  Vaping Use   Vaping Use: Never used  Substance and Sexual Activity   Alcohol use: No  Drug use: No   Sexual activity: Not Currently  Other Topics Concern   Not on file  Social History Narrative   Lives with 2 sisters   Caffeine- none   Social Determinants of Health   Financial Resource Strain: Not on file  Food Insecurity: Not on file  Transportation Needs: Not on file  Physical Activity: Not on file  Stress: Not on file  Social Connections: Not on file    Allergies:  Allergies  Allergen Reactions   Amoxicillin Hives and Swelling    LIP SWELLING PATIENT HAS HAD A PCN REACTION WITH IMMEDIATE RASH, FACIAL/TONGUE/THROAT SWELLING, SOB, OR LIGHTHEADEDNESS WITH HYPOTENSION:  #  #  #  YES  #  #  #   HAS PT DEVELOPED SEVERE RASH INVOLVING MUCUS MEMBRANES or SKIN NECROSIS: #  #  #  YES  #  #  #  Has patient had a PCN reaction that required hospitalization: No Has patient had a PCN reaction occurring within the last 10 years: Unknown If all of the above answers are "NO", then may proceed with Cephalosporin use.    Avelox [Moxifloxacin Hcl In  Nacl] Other (See Comments)    Caused pain in shoulders to finger, tendonitis   Adhesive [Tape] Dermatitis    Blisters skin   Bextra [Valdecoxib] Hives and Swelling   Doxycycline Other (See Comments)   Doxycycline Hyclate Hives and Swelling   Gadobutrol Hives   Gadolinium Derivatives Hives   Gadoversetamide Hives   Iodinated Contrast Media Hives   Lamictal [Lamotrigine] Hives   Naproxen Hypertension   Oxycodone-Acetaminophen Other (See Comments)   Percolone [Oxycodone] Hives   Relafen [Nabumetone] Hypertension   Risperdal [Risperidone] Other (See Comments)    Leg weakness    Nitrofurantoin Other (See Comments)   Brintellix [Vortioxetine] Nausea And Vomiting   Fetzima [Levomilnacipran] Other (See Comments)    Tremors across head    Moxifloxacin Other (See Comments)    Caused pain in shoulders to finger, tendonitis   Sulfamethoxazole-Trimethoprim Rash    Flushing and rash    Metabolic Disorder Labs: No results found for: "HGBA1C", "MPG" Lab Results  Component Value Date   PROLACTIN 13.1 06/09/2017   No results found for: "CHOL", "TRIG", "HDL", "CHOLHDL", "VLDL", "LDLCALC" No results found for: "TSH"  Therapeutic Level Labs: No results found for: "LITHIUM" No results found for: "VALPROATE" No results found for: "CBMZ"  Current Medications: Current Outpatient Medications  Medication Sig Dispense Refill   Acetaminophen (TYLENOL PO) Take 1 tablet by mouth daily as needed (PAIN).     albuterol (PROVENTIL) (2.5 MG/3ML) 0.083% nebulizer solution Can use one vial in nebulizer every four to six hours as needed for cough or wheeze. 180 mL 1   allopurinol (ZYLOPRIM) 100 MG tablet Take by mouth 2 (two) times daily.      aspirin 81 MG tablet Take 81 mg by mouth daily.     azithromycin (ZITHROMAX) 250 MG tablet TAKE 1 TABLET BY MOUTH THREE TIMES A WEEK. 12 tablet 4   BIOTIN PO Take by mouth daily.     Cholecalciferol (VITAMIN D-3) 5000 UNITS TABS Take 5,000 Units by mouth daily.       clotrimazole-betamethasone (LOTRISONE) cream SMARTSIG:sparingly Topical Twice Daily     estradiol (ESTRACE) 0.1 MG/GM vaginal cream PLEASE SEE ATTACHED FOR DETAILED DIRECTIONS     famotidine (PEPCID) 20 MG tablet Take 20 mg by mouth 2 (two) times daily.     flecainide (TAMBOCOR) 50 MG tablet SMARTSIG:1 Tablet(s) By Mouth  Every 12 Hours     furosemide (LASIX) 20 MG tablet Take 20 mg by mouth daily.     ipratropium (ATROVENT) 0.03 % nasal spray USE 2 SPRAYS IN NOSTRIL (S) 2 TIMES DAILY AS DIRECTED  11   ketoconazole (NIZORAL) 2 % shampoo Apply 1 application topically as needed for irritation.      loratadine (CLARITIN) 10 MG tablet Take 10 mg by mouth at bedtime.      losartan (COZAAR) 50 MG tablet Take 50 mg by mouth daily.     lubiprostone (AMITIZA) 24 MCG capsule Take 24 mcg by mouth daily as needed for constipation.      meclizine (ANTIVERT) 25 MG tablet PLEASE SEE ATTACHED FOR DETAILED DIRECTIONS     meloxicam (MOBIC) 7.5 MG tablet Take 7.5 mg by mouth 2 (two) times daily.  0   methocarbamol (ROBAXIN) 500 MG tablet Take 500 mg by mouth 4 (four) times daily.     metoprolol succinate (TOPROL-XL) 25 MG 24 hr tablet Take by mouth 3 (three) times daily.      Mometasone Furoate (ASMANEX HFA) 200 MCG/ACT AERO Inhale two puffs once daily as directed.  Rinse, gargle, and spit after use. 13 g 11   montelukast (SINGULAIR) 10 MG tablet Take 10 mg by mouth daily.  5   mupirocin ointment (BACTROBAN) 2 % Place 1 application into the nose 2 (two) times daily as needed (IRRITATION).      ondansetron (ZOFRAN-ODT) 4 MG disintegrating tablet Take 4 mg by mouth 2 (two) times daily as needed for nausea or vomiting.     pregabalin (LYRICA) 75 MG capsule Take 75 mg by mouth 2 (two) times daily. Reported on 10/09/2015     PROAIR RESPICLICK 408 (90 Base) MCG/ACT AEPB INHALE TWO DOSES BY MOUTH EVERY FOUR TO SIX HOURS AS NEEDED FOR COUGH OR WHEEZE. 3 each 2   Spacer/Aero-Holding Chambers (AEROCHAMBER PLUS) inhaler Use  as instructed with inhaler. 1 each 2   SPIRIVA RESPIMAT 1.25 MCG/ACT AERS INHALE TWO PUFFS ONCE DAILY AS DIRECTED. 4 g 5   spironolactone (ALDACTONE) 25 MG tablet Take 25 mg by mouth daily.     traMADol (ULTRAM) 50 MG tablet Take 50 mg by mouth.     triamcinolone cream (KENALOG) 0.1 % Apply 1 application topically 2 (two) times daily as needed (IRRITATION).      triamcinolone ointment (KENALOG) 0.5 % Apply 1 application topically 2 (two) times daily as needed (IRRITATION).     vitamin B-12 (CYANOCOBALAMIN) 1000 MCG tablet Take 1,000 mcg by mouth daily.     ARIPiprazole (ABILIFY) 30 MG tablet Take 1 tablet (30 mg total) by mouth daily. 90 tablet 0   buPROPion (WELLBUTRIN XL) 300 MG 24 hr tablet TAKE 1 TABLET BY MOUTH EVERY DAY IN THE MORNING 90 tablet 0   hydrOXYzine (ATARAX) 10 MG tablet Take 1 tablet (10 mg total) by mouth 3 (three) times daily as needed for anxiety. 270 tablet 0   No current facility-administered medications for this visit.     Musculoskeletal: Strength & Muscle Tone: decreased Gait & Station: normal, walks with cane Patient leans: Front  Psychiatric Specialty Exam: Review of Systems  There were no vitals taken for this visit.There is no height or weight on file to calculate BMI.  General Appearance: Casual and Neat  Eye Contact:  Good  Speech:  Clear and Coherent and Normal Rate  Volume:  Normal  Mood:  Euthymic  Affect:  Full Range  Thought Process:  Goal  Directed, Linear, and Descriptions of Associations: Intact  Orientation:  Full (Time, Place, and Person)  Thought Content: Logical   Suicidal Thoughts:  No  Homicidal Thoughts:  No  Memory:  Immediate;   Good  Judgement:  Good  Insight:  Good  Psychomotor Activity:  Normal  Concentration:  Concentration: Good  Recall:  Good  Fund of Knowledge: Good  Language: Good  Akathisia:  No  Handed:  Right  AIMS (if indicated): not done  Assets:  Communication Skills Desire for Improvement Financial  Resources/Insurance Housing Resilience Social Support Talents/Skills Transportation Vocational/Educational  ADL's:  Intact  Cognition: WNL  Sleep:  Good   Screenings: PHQ2-9    [place] Office Visit from 03/13/2022 in [facility] Office Visit from 11/14/2021 in [facility] Video Visit from 07/25/2021 in [facility] Video Visit from 12/13/2020 in [facility] Video Visit from 10/25/2020 in [facility]  PHQ-2 Total Score 0 '1 1 3 2  '$ PHQ-9 Total Score -- -- -- 8 8      [place] Office Visit from 03/13/2022 in [facility] Video Visit from 07/25/2021 in [facility] Video Visit from 12/13/2020 in [facility] No Risk No Risk No Risk        Assessment and Plan:     Medication management with supportive therapy. Risks and benefits, side effects and alternative treatment options discussed with patient. Pt was given an opportunity to ask questions about medication, illness, and treatment. All current psychiatric medications have been reviewed and discussed with the patient and adjusted as clinically appropriate. The patient has been provided an accurate and updated list of the medications being now prescribed. Pt verbalized understanding and verbal consent obtained for treatment.   Status of current problems: stable mood  Meds: continue with current meds We have tried many different medications to control the TD but nothing was effective. Decreasing Abilify did not help to decrease TD either. At this point in time she would like to continue her current meds  She is no longer seeing her neurologist as he told her that there was nothing else he could  do.  Labs: EKG 02/06/22 Qtc 469  Therapy: brief supportive therapy provided.    F/up in 6 months or sooner if needed  The duration of this appointment visit was 20 minutes of face-to-face time with the patient.  Greater than 50% of this time was spent in counseling, explanation of  diagnosis, planning of further management, and coordination of care    Collaboration of Care: Collaboration of Care: Other none  Patient/Guardian was advised Release of Information must be obtained prior to any record release in order to collaborate their care with an outside provider. Patient/Guardian was advised if they have not already done so to contact the registration department to sign all necessary forms in order for [place] to release information regarding their care.   Consent: Patient/Guardian gives verbal consent for treatment and assignment of benefits for services provided during this visit. Patient/Guardian expressed understanding and agreed to proceed.    Charlcie Cradle, MD 03/13/2022, 2:26 PM

## 2022-04-19 ENCOUNTER — Other Ambulatory Visit: Payer: Self-pay | Admitting: Allergy and Immunology

## 2022-04-21 NOTE — Telephone Encounter (Signed)
Is pt still suppose to betaking this 3xweek?

## 2022-05-05 ENCOUNTER — Other Ambulatory Visit: Payer: Self-pay | Admitting: Allergy and Immunology

## 2022-05-14 ENCOUNTER — Other Ambulatory Visit: Payer: Self-pay | Admitting: Allergy and Immunology

## 2022-05-25 ENCOUNTER — Other Ambulatory Visit (HOSPITAL_COMMUNITY): Payer: Self-pay | Admitting: Psychiatry

## 2022-05-25 DIAGNOSIS — F332 Major depressive disorder, recurrent severe without psychotic features: Secondary | ICD-10-CM

## 2022-08-18 ENCOUNTER — Other Ambulatory Visit (HOSPITAL_COMMUNITY): Payer: Self-pay | Admitting: Student

## 2022-08-18 DIAGNOSIS — M5440 Lumbago with sciatica, unspecified side: Secondary | ICD-10-CM

## 2022-09-11 ENCOUNTER — Encounter (HOSPITAL_COMMUNITY): Payer: Self-pay | Admitting: Psychiatry

## 2022-09-11 ENCOUNTER — Ambulatory Visit (HOSPITAL_BASED_OUTPATIENT_CLINIC_OR_DEPARTMENT_OTHER): Payer: PPO | Admitting: Psychiatry

## 2022-09-11 DIAGNOSIS — F332 Major depressive disorder, recurrent severe without psychotic features: Secondary | ICD-10-CM

## 2022-09-11 DIAGNOSIS — F411 Generalized anxiety disorder: Secondary | ICD-10-CM | POA: Diagnosis not present

## 2022-09-11 DIAGNOSIS — F952 Tourette's disorder: Secondary | ICD-10-CM

## 2022-09-11 MED ORDER — HYDROXYZINE HCL 10 MG PO TABS: 10.0000 mg | ORAL_TABLET | Freq: Three times a day (TID) | ORAL | 1 refills | Status: DC | PRN

## 2022-09-11 MED ORDER — ARIPIPRAZOLE 30 MG PO TABS: 30.0000 mg | ORAL_TABLET | Freq: Every day | ORAL | 1 refills | Status: DC

## 2022-09-11 MED ORDER — BUPROPION HCL ER (XL) 300 MG PO TB24: 300.0000 mg | ORAL_TABLET | Freq: Every day | ORAL | 1 refills | Status: DC

## 2022-09-11 NOTE — Progress Notes (Signed)
BH MD/PA/NP OP Progress Note  09/11/2022 10:53 AM Penny Porter  MRN:  [id number]  Chief Complaint:  Chief Complaint  Patient presents with   Follow-up   HPI: "I think I am doing better". Penny Porter shares she had severe back pain about a month ago. She was supposed to get an MRI but had to cancel due to being sick. Penny Porter shares she has rescheduled. Lately the back pain comes and goes. The pain meds help. Penny Porter is hoping she doesn't need back pain, which really scares her. Her sleep is pretty good and she is getting 7-8 hrs/night. Her energy is poor and she is not able to do much do to her physical limitations. Penny Porter shares that a friend recently passed away recently. She is grieving but her overall depression is manageable and no worse than usual. She denies anhedonia and is engaging in hobbies. She denies SI/HI. Penny Porter reports she has bilateral hand tremors. It does not interfere with activities, such as coloring or drawing or eating. When anxious both hands shake badly. She has weighted silverware but has not tried them yet. She has noticed she has abdominal tics when stressed. Anytime she is stressed (which she states it not often) then she has tics. Penny Porter gets anxious when stressed a couple times a week. It does not last long because she uses coping skills and Vistaril. She feels the meds are working well and denies SE.   Visit Diagnosis:    ICD-10-CM   1. Tourette's disease  F95.2 ARIPiprazole (ABILIFY) 30 MG tablet    2. Major depressive disorder, recurrent, severe without psychotic features ([place])  F33.2 ARIPiprazole (ABILIFY) 30 MG tablet    buPROPion (WELLBUTRIN XL) 300 MG 24 hr tablet    3. GAD (generalized anxiety disorder)  F41.1 hydrOXYzine (ATARAX) 10 MG tablet      Past Psychiatric History: no changes per patient. See H&P  Past Medical History:  Past Medical History:  Diagnosis Date   Anxiety    Arthritis    Back and hip   Asthma    Bursitis    Complication of anesthesia    bladder  hard to wake up, never needed a in and out cath   Costochondral chest pain    Degenerative disk disease    Depression    Gastroparesis    GERD (gastroesophageal reflux disease)    Hypertension    Lumbar herniated disc    Premature atrial beats 12/2019   Sleep apnea    not using CPAP   Stenosis of lumbosacral spine    Stress fracture of foot    Thyroid nodule    Tourette disease    Trigeminal nerve disease     Past Surgical History:  Procedure Laterality Date   BACK SURGERY  September 19, 2015   bone spur removal on back     CHOLECYSTECTOMY  2005   COLONOSCOPY     ELBOW SURGERY Left May 29, 2014   ESOPHAGOGASTRODUODENOSCOPY     KNEE SURGERY  06/2016   polyp removal     POSTERIOR FUSION LUMBAR SPINE  06/26/2017   ruptured disk  2008   TEAR DUCT PROBING  2009    Family Psychiatric and Medical History:  Family History  Problem Relation Age of Onset   Mental retardation Sister    Dementia Father    Cancer Mother    Cancer Brother        colorectal   Suicidality Neg Hx     Social History:  Social History   Socioeconomic History   Marital status: Single    Spouse name: Not on file   Number of children: Not on file   Years of education: Not on file   Highest education level: High school graduate  Occupational History    Comment: disabled   Tobacco Use   Smoking status: Never   Smokeless tobacco: Never  Vaping Use   Vaping Use: Never used  Substance and Sexual Activity   Alcohol use: No   Drug use: No   Sexual activity: Not Currently  Other Topics Concern   Not on file  Social History Narrative   Lives with 2 sisters   Caffeine- none   Social Determinants of Health   Financial Resource Strain: Not on file  Food Insecurity: Not on file  Transportation Needs: Not on file  Physical Activity: Not on file  Stress: Not on file  Social Connections: Not on file    Allergies:  Allergies  Allergen Reactions   Amoxicillin Hives and Swelling    LIP  SWELLING PATIENT HAS HAD A PCN REACTION WITH IMMEDIATE RASH, FACIAL/TONGUE/THROAT SWELLING, SOB, OR LIGHTHEADEDNESS WITH HYPOTENSION:  #  #  #  YES  #  #  #   HAS PT DEVELOPED SEVERE RASH INVOLVING MUCUS MEMBRANES or SKIN NECROSIS: #  #  #  YES  #  #  #  Has patient had a PCN reaction that required hospitalization: No Has patient had a PCN reaction occurring within the last 10 years: Unknown If all of the above answers are "NO", then may proceed with Cephalosporin use.    Avelox [Moxifloxacin Hcl In Nacl] Other (See Comments)    Caused pain in shoulders to finger, tendonitis   Adhesive [Tape] Dermatitis    Blisters skin   Bextra [Valdecoxib] Hives and Swelling   Doxycycline Other (See Comments)   Doxycycline Hyclate Hives and Swelling   Gadobutrol Hives   Gadolinium Derivatives Hives   Gadoversetamide Hives   Iodinated Contrast Media Hives   Lamictal [Lamotrigine] Hives   Naproxen Hypertension   Oxycodone-Acetaminophen Other (See Comments)   Percolone [Oxycodone] Hives   Relafen [Nabumetone] Hypertension   Risperdal [Risperidone] Other (See Comments)    Leg weakness    Nitrofurantoin Other (See Comments)   Brintellix [Vortioxetine] Nausea And Vomiting   Fetzima [Levomilnacipran] Other (See Comments)    Tremors across head    Moxifloxacin Other (See Comments)    Caused pain in shoulders to finger, tendonitis   Sulfamethoxazole-Trimethoprim Rash    Flushing and rash    Metabolic Disorder Labs: No results found for: "HGBA1C", "MPG" Lab Results  Component Value Date   PROLACTIN 13.1 06/09/2017   No results found for: "CHOL", "TRIG", "HDL", "CHOLHDL", "VLDL", "LDLCALC" No results found for: "TSH"  Therapeutic Level Labs: No results found for: "LITHIUM" No results found for: "VALPROATE" No results found for: "CBMZ"  Current Medications: Current Outpatient Medications  Medication Sig Dispense Refill   Acetaminophen (TYLENOL PO) Take 1 tablet by mouth daily as needed  (PAIN).     albuterol (PROVENTIL) (2.5 MG/3ML) 0.083% nebulizer solution Can use one vial in nebulizer every four to six hours as needed for cough or wheeze. 180 mL 1   albuterol (VENTOLIN HFA) 108 (90 Base) MCG/ACT inhaler Inhale 2 puffs into the lungs every 4 (four) hours as needed for wheezing or shortness of breath. 1 each 5   allopurinol (ZYLOPRIM) 100 MG tablet Take by mouth 2 (two) times daily.  aspirin 81 MG tablet Take 81 mg by mouth daily.     azithromycin (ZITHROMAX) 250 MG tablet TAKE 1 TABLET BY MOUTH THREE TIMES A WEEK. 12 tablet 4   BIOTIN PO Take by mouth daily.     Cholecalciferol (VITAMIN D-3) 5000 UNITS TABS Take 5,000 Units by mouth daily.      clotrimazole-betamethasone (LOTRISONE) cream SMARTSIG:sparingly Topical Twice Daily     famotidine (PEPCID) 20 MG tablet Take 20 mg by mouth 2 (two) times daily.     flecainide (TAMBOCOR) 50 MG tablet SMARTSIG:1 Tablet(s) By Mouth Every 12 Hours     furosemide (LASIX) 20 MG tablet Take 20 mg by mouth daily.     ipratropium (ATROVENT) 0.03 % nasal spray USE 2 SPRAYS IN NOSTRIL (S) 2 TIMES DAILY AS DIRECTED  11   ketoconazole (NIZORAL) 2 % shampoo Apply 1 application topically as needed for irritation.      loratadine (CLARITIN) 10 MG tablet Take 10 mg by mouth at bedtime.      losartan (COZAAR) 50 MG tablet Take 50 mg by mouth daily.     lubiprostone (AMITIZA) 24 MCG capsule Take 24 mcg by mouth daily as needed for constipation.      meclizine (ANTIVERT) 25 MG tablet PLEASE SEE ATTACHED FOR DETAILED DIRECTIONS     meloxicam (MOBIC) 7.5 MG tablet Take 7.5 mg by mouth 2 (two) times daily.  0   methocarbamol (ROBAXIN) 500 MG tablet Take 500 mg by mouth 4 (four) times daily.     metoprolol succinate (TOPROL-XL) 25 MG 24 hr tablet Take by mouth 3 (three) times daily.      Mometasone Furoate (ASMANEX HFA) 200 MCG/ACT AERO Inhale two puffs once daily as directed.  Rinse, gargle, and spit after use. 13 g 11   montelukast (SINGULAIR) 10 MG  tablet Take 10 mg by mouth daily.  5   mupirocin ointment (BACTROBAN) 2 % Place 1 application into the nose 2 (two) times daily as needed (IRRITATION).      ondansetron (ZOFRAN-ODT) 4 MG disintegrating tablet Take 4 mg by mouth 2 (two) times daily as needed for nausea or vomiting.     pregabalin (LYRICA) 75 MG capsule Take 75 mg by mouth 2 (two) times daily. Reported on 10/09/2015     Spacer/Aero-Holding Chambers (AEROCHAMBER PLUS) inhaler Use as instructed with inhaler. 1 each 2   SPIRIVA RESPIMAT 1.25 MCG/ACT AERS INHALE TWO PUFFS ONCE DAILY AS DIRECTED. 4 g 5   spironolactone (ALDACTONE) 25 MG tablet Take 25 mg by mouth daily.     traMADol (ULTRAM) 50 MG tablet Take 50 mg by mouth.     triamcinolone cream (KENALOG) 0.1 % Apply 1 application topically 2 (two) times daily as needed (IRRITATION).      triamcinolone ointment (KENALOG) 0.5 % Apply 1 application topically 2 (two) times daily as needed (IRRITATION).     vitamin B-12 (CYANOCOBALAMIN) 1000 MCG tablet Take 1,000 mcg by mouth daily.     ARIPiprazole (ABILIFY) 30 MG tablet Take 1 tablet (30 mg total) by mouth daily. 90 tablet 1   buPROPion (WELLBUTRIN XL) 300 MG 24 hr tablet Take 1 tablet (300 mg total) by mouth daily. 90 tablet 1   estradiol (ESTRACE) 0.1 MG/GM vaginal cream PLEASE SEE ATTACHED FOR DETAILED DIRECTIONS (Patient not taking: Reported on 09/11/2022)     hydrOXYzine (ATARAX) 10 MG tablet Take 1 tablet (10 mg total) by mouth 3 (three) times daily as needed for anxiety. 270 tablet 1  No current facility-administered medications for this visit.     Musculoskeletal: Strength & Muscle Tone: decreased Gait & Station:  wide, walks with walker Patient leans: Front  Psychiatric Specialty Exam: Review of Systems  Blood pressure (!) 154/98, pulse 81, resp. rate 20, height '5\' 9"'$  (1.753 m), weight 283 lb 3.2 oz (128.5 kg), SpO2 95 %.Body mass index is 41.82 kg/m.  General Appearance: Casual and Fairly Groomed  Eye Contact:  Good   Speech:  Clear and Coherent and Normal Rate  Volume:  Normal  Mood:  Euthymic  Affect:  Full Range  Thought Process:  Goal Directed, Linear, and Descriptions of Associations: Intact  Orientation:  Full (Time, Place, and Person)  Thought Content: Logical   Suicidal Thoughts:  No  Homicidal Thoughts:  No  Memory:  Immediate;   Good  Judgement:  Good  Insight:  Good  Psychomotor Activity:  Tremor  Concentration:  Concentration: Good  Recall:  Good  Fund of Knowledge: Good  Language: Good  Akathisia:  No  Handed:  Right  AIMS (if indicated): not done  Assets:  Communication Skills Desire for Improvement Financial Resources/Insurance Housing Resilience Social Support Talents/Skills Transportation Vocational/Educational  ADL's:  Intact  Cognition: WNL  Sleep:  Good   Screenings: PHQ2-9    [place] Office Visit from 09/11/2022 in [facility] Office Visit from 03/13/2022 in [facility] Office Visit from 11/14/2021 in [facility] Video Visit from 07/25/2021 in [facility] Video Visit from 12/13/2020 in [facility]  PHQ-2 Total Score 1 0 '1 1 3  '$ PHQ-9 Total Score -- -- -- -- [place] Office Visit from 09/11/2022 in [facility] Office Visit from 03/13/2022 in [facility] Video Visit from 07/25/2021 in [place] No Risk No Risk No Risk        Assessment and Plan:   Medication management with supportive therapy. Risks and benefits, side effects and alternative treatment options discussed with patient. Pt was given an opportunity to ask questions about medication, illness, and treatment. All current psychiatric medications have been  reviewed and discussed with the patient and adjusted as clinically appropriate. The patient has been provided an accurate and updated list of the medications being now prescribed. Pt verbalized understanding and verbal consent obtained for treatment.    Pt is aware that these meds carry a teratogenic risk. Pt will discuss plan of action if she does or plans to become pregnant in the future.  Status of current problems: anxiety and resulting tics are bothersome. Her depression is under control.   Meds: no changes today   Labs: she is getting blood work next week. Asked she get EKG done as well.   Therapy: brief supportive therapy provided. Discussed psychosocial stressors in detail. Talked about doing chair exercises that can be found online.   Consultations none    F/up in 5-6  months or sooner if needed  The duration of this appointment visit was 15 minutes of face-to-face time with the patient.  Greater than 50% of this time was spent in counseling, explanation of  diagnosis, planning of further management, and coordination of care   Collaboration of Care: Collaboration of Care: Other none  Patient/Guardian was advised Release of Information must be obtained prior to any record release in order to collaborate their care  with an outside provider. Patient/Guardian was advised if they have not already done so to contact the registration department to sign all necessary forms in order for [place] to release information regarding their care.   Consent: Patient/Guardian gives verbal consent for treatment and assignment of benefits for services provided during this visit. Patient/Guardian expressed understanding and agreed to proceed.    Charlcie Cradle, MD 09/11/2022, 10:53 AM

## 2022-10-06 ENCOUNTER — Other Ambulatory Visit: Payer: Self-pay | Admitting: Allergy and Immunology

## 2022-10-15 ENCOUNTER — Other Ambulatory Visit: Payer: Self-pay | Admitting: Allergy and Immunology

## 2022-11-13 HISTORY — PX: DILATION AND CURETTAGE OF UTERUS: SHX78

## 2022-12-29 ENCOUNTER — Encounter: Payer: Self-pay | Admitting: Allergy and Immunology

## 2022-12-29 ENCOUNTER — Ambulatory Visit (INDEPENDENT_AMBULATORY_CARE_PROVIDER_SITE_OTHER): Payer: PPO | Admitting: Allergy and Immunology

## 2022-12-29 VITALS — BP 124/84 | HR 76 | Temp 98.5°F | Resp 16 | Ht 69.0 in | Wt 289.0 lb

## 2022-12-29 DIAGNOSIS — L719 Rosacea, unspecified: Secondary | ICD-10-CM | POA: Diagnosis not present

## 2022-12-29 DIAGNOSIS — J3089 Other allergic rhinitis: Secondary | ICD-10-CM

## 2022-12-29 DIAGNOSIS — K219 Gastro-esophageal reflux disease without esophagitis: Secondary | ICD-10-CM

## 2022-12-29 DIAGNOSIS — J4540 Moderate persistent asthma, uncomplicated: Secondary | ICD-10-CM | POA: Diagnosis not present

## 2022-12-29 MED ORDER — FAMOTIDINE 20 MG PO TABS: 20.0000 mg | ORAL_TABLET | Freq: Two times a day (BID) | ORAL | 1 refills | Status: DC

## 2022-12-29 MED ORDER — ASMANEX HFA 200 MCG/ACT IN AERO: INHALATION_SPRAY | RESPIRATORY_TRACT | 1 refills | Status: AC

## 2022-12-29 MED ORDER — SPIRIVA RESPIMAT 1.25 MCG/ACT IN AERS: 2.0000 | INHALATION_SPRAY | Freq: Every day | RESPIRATORY_TRACT | 1 refills | Status: AC

## 2022-12-29 MED ORDER — MONTELUKAST SODIUM 10 MG PO TABS: 10.0000 mg | ORAL_TABLET | Freq: Every day | ORAL | 1 refills | Status: DC

## 2022-12-29 MED ORDER — ALBUTEROL SULFATE (2.5 MG/3ML) 0.083% IN NEBU: INHALATION_SOLUTION | RESPIRATORY_TRACT | 1 refills | Status: DC

## 2022-12-29 MED ORDER — ALBUTEROL SULFATE HFA 108 (90 BASE) MCG/ACT IN AERS: 2.0000 | INHALATION_SPRAY | RESPIRATORY_TRACT | 1 refills | Status: DC | PRN

## 2022-12-29 NOTE — Progress Notes (Unsigned)
[place] [facility] [place] [facility] [place] [facility] [place] [facility] [place]   Follow-up Note  Referring Provider: Gordan Payment., MD Primary Provider: Gordan Payment., MD Date of Office Visit: 12/29/2022  Subjective:   Penny Porter (DOB: 11-29-1965) is a 57 y.o. female who [facility] in re-evaluation of the following:  HPI: Jalyric returns to this clinic in evaluation of asthma, allergic rhinitis, LPR.  I last saw her in this clinic 26 Dec 2021.   She had very significant improvement regarding control of her asthma while she is tapered off her controller agents and now she only uses albuterol probably 1 time per month and has not had any significant exacerbation requiring a systemic steroid or a visit with an urgent care center.  She does not really exert herself secondary to various musculoskeletal issues most recently her lower back.  She believes that her nose is doing very well at this point in time while using montelukast send some nasal steroid.  She has not required an antibiotic treating episode of sinusitis.  Her reflux is under very good control using famotidine mostly 1 time per day.  She has rosacea and she has asked me to fill her azithromycin which she uses 3 times per week.  Allergies as of 12/29/2022       Reactions   Amoxicillin Hives, Swelling   LIP SWELLING PATIENT HAS HAD A PCN REACTION WITH IMMEDIATE RASH, FACIAL/TONGUE/THROAT SWELLING, SOB, OR LIGHTHEADEDNESS WITH HYPOTENSION:  #  #  #  YES  #  #  #   HAS PT DEVELOPED SEVERE RASH INVOLVING MUCUS MEMBRANES or SKIN NECROSIS: #  #  #  YES  #  #  #  Has patient had a PCN reaction that required hospitalization: No Has patient had a PCN reaction occurring within the last 10 years: Unknown If all of the above answers are "NO", then may proceed with Cephalosporin use.   Avelox [moxifloxacin Hcl In Nacl] Other (See Comments)   Caused pain in shoulders to finger, tendonitis   Adhesive [tape]  Dermatitis   Blisters skin   Bextra [valdecoxib] Hives, Swelling   Doxycycline Other (See Comments)   Doxycycline Hyclate Hives, Swelling   Gadobutrol Hives   Gadolinium Derivatives Hives   Gadoversetamide Hives   Iodinated Contrast Media Hives   Lamictal [lamotrigine] Hives   Naproxen Hypertension   Oxycodone-acetaminophen Other (See Comments)   Percolone [oxycodone] Hives   Relafen [nabumetone] Hypertension   Risperdal [risperidone] Other (See Comments)   Leg weakness   Nitrofurantoin Other (See Comments)   Brintellix [vortioxetine] Nausea And Vomiting   Fetzima [levomilnacipran] Other (See Comments)   Tremors across head   Moxifloxacin Other (See Comments)   Caused pain in shoulders to finger, tendonitis   Sulfamethoxazole-trimethoprim Rash   Flushing and rash        Medication List    AeroChamber Plus inhaler Use as instructed with inhaler.   albuterol (2.5 MG/3ML) 0.083% nebulizer solution Commonly known as: PROVENTIL Can use one vial in nebulizer every four to six hours as needed for cough or wheeze.   albuterol 108 (90 Base) MCG/ACT inhaler Commonly known as: VENTOLIN HFA INHALE 2 PUFFS INTO THE LUNGS EVERY 4 HOURS AS NEEDED FOR WHEEZING OR SHORTNESS OF BREATH.   allopurinol 100 MG tablet Commonly known as: ZYLOPRIM Take by mouth 2 (two) times daily.   ARIPiprazole 30 MG tablet Commonly known as: ABILIFY Take 1 tablet (30 mg total) by  mouth daily.   Asmanex HFA 200 MCG/ACT Aero Generic drug: Mometasone Furoate Inhale two puffs once daily as directed.  Rinse, gargle, and spit after use.   aspirin 81 MG tablet Take 81 mg by mouth daily.   azithromycin 250 MG tablet Commonly known as: ZITHROMAX TAKE 1 TABLET BY MOUTH THREE TIMES A WEEK.   buPROPion 300 MG 24 hr tablet Commonly known as: WELLBUTRIN XL Take 1 tablet (300 mg total) by mouth daily.   clotrimazole-betamethasone cream Commonly known as: LOTRISONE SMARTSIG:sparingly Topical Twice  Daily   cyanocobalamin 1000 MCG tablet Commonly known as: VITAMIN B12 Take 1,000 mcg by mouth daily.   famotidine 20 MG tablet Commonly known as: PEPCID Take 20 mg by mouth 2 (two) times daily.   flecainide 50 MG tablet Commonly known as: TAMBOCOR SMARTSIG:1 Tablet(s) By Mouth Every 12 Hours   furosemide 20 MG tablet Commonly known as: LASIX Take 20 mg by mouth daily.   hydrOXYzine 10 MG tablet Commonly known as: ATARAX Take 1 tablet (10 mg total) by mouth 3 (three) times daily as needed for anxiety.   ipratropium 0.03 % nasal spray Commonly known as: ATROVENT USE 2 SPRAYS IN NOSTRIL (S) 2 TIMES DAILY AS DIRECTED   ketoconazole 2 % shampoo Commonly known as: NIZORAL Apply 1 application topically as needed for irritation.   loratadine 10 MG tablet Commonly known as: CLARITIN Take 10 mg by mouth at bedtime.   losartan 50 MG tablet Commonly known as: COZAAR Take 50 mg by mouth daily.   lubiprostone 24 MCG capsule Commonly known as: AMITIZA Take 24 mcg by mouth daily as needed for constipation.   meclizine 25 MG tablet Commonly known as: ANTIVERT PLEASE SEE ATTACHED FOR DETAILED DIRECTIONS   meloxicam 7.5 MG tablet Commonly known as: MOBIC Take 7.5 mg by mouth 2 (two) times daily.   methocarbamol 500 MG tablet Commonly known as: ROBAXIN Take 500 mg by mouth 4 (four) times daily.   metoprolol succinate 25 MG 24 hr tablet Commonly known as: TOPROL-XL Take by mouth 3 (three) times daily.   montelukast 10 MG tablet Commonly known as: SINGULAIR Take 10 mg by mouth daily.   mupirocin ointment 2 % Commonly known as: BACTROBAN Place 1 application into the nose 2 (two) times daily as needed (IRRITATION).   ondansetron 4 MG disintegrating tablet Commonly known as: ZOFRAN-ODT Take 4 mg by mouth 2 (two) times daily as needed for nausea or vomiting.   pregabalin 75 MG capsule Commonly known as: LYRICA Take 75 mg by mouth 2 (two) times daily. Reported on  10/09/2015   Spiriva Respimat 1.25 MCG/ACT Aers Generic drug: Tiotropium Bromide Monohydrate INHALE TWO PUFFS ONCE DAILY AS DIRECTED.   spironolactone 25 MG tablet Commonly known as: ALDACTONE Take 25 mg by mouth daily.   traMADol 50 MG tablet Commonly known as: ULTRAM Take 50 mg by mouth.   triamcinolone cream 0.1 % Commonly known as: KENALOG Apply 1 application topically 2 (two) times daily as needed (IRRITATION).   TYLENOL PO Take 1 tablet by mouth daily as needed (PAIN).   Vitamin D-3 125 MCG (5000 UT) Tabs Take 5,000 Units by mouth daily.    Past Medical History:  Diagnosis Date   Anxiety    Arthritis    Back and hip   Asthma    Bursitis    Complication of anesthesia    bladder hard to wake up, never needed a in and out cath   Costochondral chest pain  Degenerative disk disease    Depression    Gastroparesis    GERD (gastroesophageal reflux disease)    Hypertension    Lumbar herniated disc    Premature atrial beats 12/2019   Sleep apnea    not using CPAP   Stenosis of lumbosacral spine    Stress fracture of foot    Thyroid nodule    Tourette disease    Trigeminal nerve disease     Past Surgical History:  Procedure Laterality Date   BACK SURGERY  September 19, 2015   bone spur removal on back     CHOLECYSTECTOMY  2005   COLONOSCOPY     ELBOW SURGERY Left May 29, 2014   ESOPHAGOGASTRODUODENOSCOPY     KNEE SURGERY  06/2016   polyp removal     POSTERIOR FUSION LUMBAR SPINE  06/26/2017   ruptured disk  2008   TEAR DUCT PROBING  2009    Review of systems negative except as noted in HPI / PMHx or noted below:  Review of Systems  Constitutional: Negative.   HENT: Negative.    Eyes: Negative.   Respiratory: Negative.    Cardiovascular: Negative.   Gastrointestinal: Negative.   Genitourinary: Negative.   Musculoskeletal: Negative.   Skin: Negative.   Neurological: Negative.   Endo/Heme/Allergies: Negative.   Psychiatric/Behavioral: Negative.        Objective:   Vitals:   12/29/22 1007  BP: 124/84  Pulse: 76  Resp: 16  Temp: 98.5 F (36.9 C)  SpO2: 96%   Height: 5\' 9"  (175.3 cm)  Weight: 289 lb (131.1 kg)   Physical Exam Constitutional:      Appearance: She is not diaphoretic.  HENT:     Head: Normocephalic.     Right Ear: Tympanic membrane, ear canal and external ear normal.     Left Ear: Tympanic membrane, ear canal and external ear normal.     Nose: Nose normal. No mucosal edema or rhinorrhea.     Mouth/Throat:     Pharynx: Uvula midline. No oropharyngeal exudate.  Eyes:     Conjunctiva/sclera: Conjunctivae normal.  Neck:     Thyroid: No thyromegaly.     Trachea: Trachea normal. No tracheal tenderness or tracheal deviation.  Cardiovascular:     Rate and Rhythm: Normal rate and regular rhythm.     Heart sounds: Normal heart sounds, S1 normal and S2 normal. No murmur heard. Pulmonary:     Effort: No respiratory distress.     Breath sounds: Normal breath sounds. No stridor. No wheezing or rales.  Lymphadenopathy:     Head:     Right side of head: No tonsillar adenopathy.     Left side of head: No tonsillar adenopathy.     Cervical: No cervical adenopathy.  Skin:    Findings: No erythema or rash.     Nails: There is no clubbing.  Neurological:     Mental Status: She is alert.     Diagnostics:    Spirometry was performed and demonstrated an FEV1 of 1.72 at 57 % of predicted.  Assessment and Plan:   1. Asthma, moderate persistent, well-controlled   2. Other allergic rhinitis   3. LPRD (laryngopharyngeal reflux disease)   4. Rosacea    1. During periods of asthma activity can use the following:  A. Asmanex 200 - 2 inhalations 1 time a day with spacer  B. Spiriva 1.25 Respimat -2 inhalations 1 time per day    3. Continue OTC Nasacort one  spray each 1 time per day during upper airway symptoms  4. Continue montelukast 10mg  one tablet one time per day  5. Continue famotidine 20 mg 1-2 times per  day   6. Continue azithromycin 250 mg - 1 tablet 3 times per week  7. Continue ProAir HFA or similar and antihistamine and if needed  8. Return to clinic in 12 months or earlier if problem   Ardelia appears to be doing very well and she uses her medications in the appropriate manner and she will continue on montelukast and a nasal steroid and famotidine to address her issues tied up with inflammation of her airway and reflux and also continues on azithromycin to address her rosacea.  She certainly has the option of restarting Asmanex and Spiriva if she has a lower respiratory tract flare.  I will see her back in this clinic in 1 year or earlier if there is a problem.   Laurette Schimke, MD Allergy / Immunology [facility]

## 2022-12-29 NOTE — Patient Instructions (Addendum)
  1. During periods of asthma activity can use the following:  A. Asmanex 200 - 2 inhalations 1 time a day with spacer  B. Spiriva 1.25 Respimat -2 inhalations 1 time per day    3. Continue OTC Nasacort one spray each 1 time per day during upper airway symptoms  4. Continue montelukast 10mg  one tablet one time per day  5. Continue famotidine 20 mg 1-2 times per day   6. Continue azithromycin 250 mg - 1 tablet 3 times per week  7. Continue ProAir HFA or similar and antihistamine and if needed  8. Return to clinic in 12 months or earlier if problem

## 2022-12-30 ENCOUNTER — Encounter: Payer: Self-pay | Admitting: Allergy and Immunology

## 2023-01-12 DIAGNOSIS — M898X8 Other specified disorders of bone, other site: Secondary | ICD-10-CM | POA: Insufficient documentation

## 2023-01-14 DIAGNOSIS — R7981 Abnormal blood-gas level: Secondary | ICD-10-CM | POA: Insufficient documentation

## 2023-01-15 ENCOUNTER — Ambulatory Visit (HOSPITAL_COMMUNITY): Payer: PPO | Admitting: Psychiatry

## 2023-01-29 ENCOUNTER — Other Ambulatory Visit: Payer: Self-pay

## 2023-01-29 ENCOUNTER — Encounter (HOSPITAL_COMMUNITY): Payer: Self-pay | Admitting: Psychiatry

## 2023-01-29 ENCOUNTER — Ambulatory Visit (HOSPITAL_COMMUNITY): Payer: PPO | Admitting: Psychiatry

## 2023-01-29 DIAGNOSIS — F411 Generalized anxiety disorder: Secondary | ICD-10-CM

## 2023-01-29 DIAGNOSIS — F332 Major depressive disorder, recurrent severe without psychotic features: Secondary | ICD-10-CM

## 2023-01-29 DIAGNOSIS — F952 Tourette's disorder: Secondary | ICD-10-CM | POA: Diagnosis not present

## 2023-01-29 MED ORDER — HYDROXYZINE HCL 10 MG PO TABS
10.0000 mg | ORAL_TABLET | Freq: Three times a day (TID) | ORAL | 1 refills | Status: DC | PRN
Start: 2023-01-29 — End: 2023-07-27

## 2023-01-29 MED ORDER — BUPROPION HCL ER (XL) 300 MG PO TB24: 300.0000 mg | ORAL_TABLET | Freq: Every day | ORAL | 1 refills | Status: DC

## 2023-01-29 MED ORDER — ARIPIPRAZOLE 30 MG PO TABS
30.0000 mg | ORAL_TABLET | Freq: Every day | ORAL | 1 refills | Status: DC
Start: 2023-01-29 — End: 2023-07-27

## 2023-01-29 NOTE — Progress Notes (Signed)
BH MD/PA/NP OP Progress Note  01/29/2023 5:01 PM Euella Wellard  MRN:  [id number]  Chief Complaint:  Chief Complaint  Patient presents with   Follow-up   HPI: "I'm ik but I am having bad pain". Amiliah tries to avoid pain pills as much as possible. She is not interested in working with a pain doctor.  Basha has ongoing health problems. She can't tell if she is having tics or just EPS. Trials with different anti EPS meds failed. She went to neurology and wasn't able to find a medication to stop it. Skylarr states her depression only comes on with severe pain. She engages in her hobbies. Her sleep is good and she is getting 8-9 hrs a night. Her appetite is good and she is eating healthy foods mostly.  She denies isolation, hopelessness and anhedonia. She denies SI/HI. Her anxiety is situational and resolves quickly when it happens.  Visit Diagnosis:    ICD-10-CM   1. Tourette's disease  F95.2 ARIPiprazole (ABILIFY) 30 MG tablet    2. Major depressive disorder, recurrent, severe without psychotic features (HCC)  F33.2 ARIPiprazole (ABILIFY) 30 MG tablet    buPROPion (WELLBUTRIN XL) 300 MG 24 hr tablet    3. GAD (generalized anxiety disorder)  F41.1 hydrOXYzine (ATARAX) 10 MG tablet      Past Psychiatric History: no updates her patient  Past Medical History:  Past Medical History:  Diagnosis Date   Anxiety    Arthritis    Back and hip   Asthma    Bursitis    Complication of anesthesia    bladder hard to wake up, never needed a in and out cath   Costochondral chest pain    Degenerative disk disease    Depression    Gastroparesis    GERD (gastroesophageal reflux disease)    Hypertension    Lumbar herniated disc    Premature atrial beats 12/2019   Sleep apnea    not using CPAP   Stenosis of lumbosacral spine    Stress fracture of foot    Thyroid nodule    Tourette disease    Trigeminal nerve disease     Past Surgical History:  Procedure Laterality Date   BACK SURGERY  09/19/2015    bone spur removal on back     CHOLECYSTECTOMY  2005   COLONOSCOPY     DILATION AND CURETTAGE OF UTERUS  11/13/2022   ELBOW SURGERY Left 05/29/2014   ESOPHAGOGASTRODUODENOSCOPY     KNEE SURGERY  06/2016   polyp removal     POSTERIOR FUSION LUMBAR SPINE  06/26/2017   ruptured disk  2008   TEAR DUCT PROBING  2009    Family Psychiatric and Medical  History:  Family History  Problem Relation Age of Onset   Mental retardation Sister    Dementia Father    Cancer Mother    Cancer Brother        colorectal   Suicidality Neg Hx     Social History:  Social History   Socioeconomic History   Marital status: Single    Spouse name: Not on file   Number of children: Not on file   Years of education: Not on file   Highest education level: High school graduate  Occupational History    Comment: disabled   Tobacco Use   Smoking status: Never   Smokeless tobacco: Never  Vaping Use   Vaping Use: Never used  Substance and Sexual Activity   Alcohol use: No  Drug use: No   Sexual activity: Not Currently  Other Topics Concern   Not on file  Social History Narrative   Lives with 2 sisters   Caffeine- none   Social Determinants of Health   Financial Resource Strain: Not on file  Food Insecurity: Not on file  Transportation Needs: Not on file  Physical Activity: Not on file  Stress: Not on file  Social Connections: Not on file    Allergies:  Allergies  Allergen Reactions   Amoxicillin Hives and Swelling    LIP SWELLING PATIENT HAS HAD A PCN REACTION WITH IMMEDIATE RASH, FACIAL/TONGUE/THROAT SWELLING, SOB, OR LIGHTHEADEDNESS WITH HYPOTENSION:  #  #  #  YES  #  #  #   HAS PT DEVELOPED SEVERE RASH INVOLVING MUCUS MEMBRANES or SKIN NECROSIS: #  #  #  YES  #  #  #  Has patient had a PCN reaction that required hospitalization: No Has patient had a PCN reaction occurring within the last 10 years: Unknown If all of the above answers are "NO", then may proceed with Cephalosporin  use.    Avelox [Moxifloxacin Hcl In Nacl] Other (See Comments)    Caused pain in shoulders to finger, tendonitis   Adhesive [Tape] Dermatitis    Blisters skin   Bextra [Valdecoxib] Hives and Swelling   Doxycycline Other (See Comments)   Doxycycline Hyclate Hives and Swelling   Gadobutrol Hives   Gadolinium Derivatives Hives   Gadoversetamide Hives   Iodinated Contrast Media Hives   Lamictal [Lamotrigine] Hives   Naproxen Hypertension   Oxycodone-Acetaminophen Other (See Comments)   Percolone [Oxycodone] Hives   Relafen [Nabumetone] Hypertension   Risperdal [Risperidone] Other (See Comments)    Leg weakness    Nitrofurantoin Other (See Comments)   Brintellix [Vortioxetine] Nausea And Vomiting   Fetzima [Levomilnacipran] Other (See Comments)    Tremors across head    Moxifloxacin Other (See Comments)    Caused pain in shoulders to finger, tendonitis   Sulfamethoxazole-Trimethoprim Rash    Flushing and rash    Metabolic Disorder Labs: No results found for: "HGBA1C", "MPG" Lab Results  Component Value Date   PROLACTIN 13.1 06/09/2017   No results found for: "CHOL", "TRIG", "HDL", "CHOLHDL", "VLDL", "LDLCALC" No results found for: "TSH"  Therapeutic Level Labs: No results found for: "LITHIUM" No results found for: "VALPROATE" No results found for: "CBMZ"  Current Medications: Current Outpatient Medications  Medication Sig Dispense Refill   Acetaminophen (TYLENOL PO) Take 1 tablet by mouth daily as needed (PAIN).     albuterol (PROVENTIL) (2.5 MG/3ML) 0.083% nebulizer solution Can use one vial in nebulizer every four to six hours as needed for cough or wheeze. 180 mL 1   albuterol (VENTOLIN HFA) 108 (90 Base) MCG/ACT inhaler Inhale 2 puffs into the lungs every 4 (four) hours as needed for wheezing or shortness of breath. 32 each 1   allopurinol (ZYLOPRIM) 100 MG tablet Take by mouth 2 (two) times daily.      aspirin 81 MG tablet Take 81 mg by mouth daily.      azithromycin (ZITHROMAX) 250 MG tablet TAKE 1 TABLET BY MOUTH THREE TIMES A WEEK. 12 tablet 4   Cholecalciferol (VITAMIN D-3) 5000 UNITS TABS Take 5,000 Units by mouth daily.      clotrimazole-betamethasone (LOTRISONE) cream SMARTSIG:sparingly Topical Twice Daily     famotidine (PEPCID) 20 MG tablet Take 1 tablet (20 mg total) by mouth 2 (two) times daily. 180 tablet 1  flecainide (TAMBOCOR) 50 MG tablet SMARTSIG:1 Tablet(s) By Mouth Every 12 Hours     furosemide (LASIX) 20 MG tablet Take 20 mg by mouth daily.     ipratropium (ATROVENT) 0.03 % nasal spray USE 2 SPRAYS IN NOSTRIL (S) 2 TIMES DAILY AS DIRECTED  11   ketoconazole (NIZORAL) 2 % shampoo Apply 1 application topically as needed for irritation.      loratadine (CLARITIN) 10 MG tablet Take 10 mg by mouth at bedtime.      losartan (COZAAR) 50 MG tablet Take 50 mg by mouth daily.     lubiprostone (AMITIZA) 24 MCG capsule Take 24 mcg by mouth daily as needed for constipation.      meclizine (ANTIVERT) 25 MG tablet PLEASE SEE ATTACHED FOR DETAILED DIRECTIONS     meloxicam (MOBIC) 7.5 MG tablet Take 7.5 mg by mouth 2 (two) times daily.  0   methocarbamol (ROBAXIN) 500 MG tablet Take 500 mg by mouth 4 (four) times daily.     metoprolol succinate (TOPROL-XL) 25 MG 24 hr tablet Take by mouth 3 (three) times daily.      Mometasone Furoate (ASMANEX HFA) 200 MCG/ACT AERO Inhale two puffs once daily as directed.  Rinse, gargle, and spit after use. 39 g 1   montelukast (SINGULAIR) 10 MG tablet Take 1 tablet (10 mg total) by mouth daily. 90 tablet 1   mupirocin ointment (BACTROBAN) 2 % Place 1 application into the nose 2 (two) times daily as needed (IRRITATION).      ondansetron (ZOFRAN-ODT) 4 MG disintegrating tablet Take 4 mg by mouth 2 (two) times daily as needed for nausea or vomiting.     pregabalin (LYRICA) 75 MG capsule Take 75 mg by mouth 2 (two) times daily. Reported on 10/09/2015     Spacer/Aero-Holding Chambers (AEROCHAMBER PLUS) inhaler Use  as instructed with inhaler. 1 each 2   spironolactone (ALDACTONE) 25 MG tablet Take 25 mg by mouth daily.     Tiotropium Bromide Monohydrate (SPIRIVA RESPIMAT) 1.25 MCG/ACT AERS Inhale 2 puffs into the lungs daily. 12 g 1   traMADol (ULTRAM) 50 MG tablet Take 50 mg by mouth.     triamcinolone cream (KENALOG) 0.1 % Apply 1 application topically 2 (two) times daily as needed (IRRITATION).      vitamin B-12 (CYANOCOBALAMIN) 1000 MCG tablet Take 1,000 mcg by mouth daily.     ARIPiprazole (ABILIFY) 30 MG tablet Take 1 tablet (30 mg total) by mouth daily. 90 tablet 1   buPROPion (WELLBUTRIN XL) 300 MG 24 hr tablet Take 1 tablet (300 mg total) by mouth daily. 90 tablet 1   hydrOXYzine (ATARAX) 10 MG tablet Take 1 tablet (10 mg total) by mouth 3 (three) times daily as needed for anxiety. 270 tablet 1   No current facility-administered medications for this visit.     Musculoskeletal: Strength & Muscle Tone: decreased Gait & Station: broad based Patient leans: Front  Psychiatric Specialty Exam: Review of Systems  Blood pressure (!) 154/84, pulse 86, height 5\' 9"  (1.753 m), weight 290 lb (131.5 kg).Body mass index is 42.83 kg/m.  General Appearance: Casual  Eye Contact:  Good  Speech:  Clear and Coherent and Normal Rate  Volume:  Normal  Mood:  Anxious and Depressed  Affect:  Full Range  Thought Process:  Goal Directed, Linear, and Descriptions of Associations: Intact  Orientation:  Full (Time, Place, and Person)  Thought Content: Logical   Suicidal Thoughts:  No  Homicidal Thoughts:  No  Memory:  Immediate;   Good  Judgement:  Good  Insight:  Good  Psychomotor Activity:  Normal  Concentration:  Concentration: Good  Recall:  Good  Fund of Knowledge: Good  Language: Good  Akathisia:  No  Handed:  Right  AIMS (if indicated): not done  Assets:  Communication Skills Desire for Improvement Financial Resources/Insurance Housing Leisure Time Resilience Social  Support Talents/Skills Transportation Vocational/Educational  ADL's:  Intact  Cognition: WNL  Sleep:  Good   Screenings: PHQ2-9    Flowsheet Row Office Visit from 01/29/2023 in [facility] Office Visit from 09/11/2022 in [facility] Office Visit from 03/13/2022 in [facility] Office Visit from 11[facility]14/2021 in [facility] Video Visit from 07/25/2021 in [facility]  PHQ-2 Total Score 1 1 0 1 1      Flowsheet Row Office Visit from 01/29/2023 in [facility] Office Visit from 09/11/2022 in [facility] Office Visit from 03/13/2022 in [facility]  C-SSRS RISK CATEGORY No Risk No Risk No Risk        Assessment and Plan: Confidentiality and exclusions reviewed with pt who verbalized understanding.   Medication management with supportive therapy. Risks and benefits, side effects and alternative treatment options discussed with patient. Pt was given an opportunity to ask questions about medication, illness, and treatment. All current psychiatric medications have been reviewed and discussed with the patient and adjusted as clinically appropriate. The patient has been provided an accurate and updated list of the medications being now prescribed. Pt verbalized understanding and verbal consent obtained for treatment.   Pt is aware that these meds carry a teratogenic risk. Pt will discuss plan of action if she does or plans to become pregnant in the future.  Status of current problems: mood and anxiety are stable    Labs: done in 09/2022 on care everywhere  Therapy: brief supportive therapy provided. Discussed psychosocial stressors in detail.        F/up in 5-6 months or sooner if needed with  a new provider as I am leaving [facility] in July 2024  The duration of this appointment visit was 20 minutes of face-to-face time with the patient.  Greater than 50% of this time was spent in counseling, explanation of  diagnosis, planning of further management, and coordination of care    Collaboration of Care: Collaboration of Care: Other none  Patient/Guardian was advised Release of Information must be obtained prior to any record release in order to collaborate their care with an outside provider. Patient/Guardian was advised if they have not already done so to contact the registration department to sign all necessary forms in order for [place] to release information regarding their care.   Consent: Patient/Guardian gives verbal consent for treatment and assignment of benefits for services provided during this visit. Patient/Guardian expressed understanding and agreed to proceed.    Oletta Darter, MD 01/29/2023, 5:01 PM

## 2023-02-02 DIAGNOSIS — Z7409 Other reduced mobility: Secondary | ICD-10-CM | POA: Insufficient documentation

## 2023-02-02 DIAGNOSIS — L97511 Non-pressure chronic ulcer of other part of right foot limited to breakdown of skin: Secondary | ICD-10-CM | POA: Insufficient documentation

## 2023-02-22 ENCOUNTER — Other Ambulatory Visit: Payer: Self-pay | Admitting: Allergy and Immunology

## 2023-03-14 ENCOUNTER — Other Ambulatory Visit: Payer: Self-pay | Admitting: Allergy and Immunology

## 2023-04-07 ENCOUNTER — Other Ambulatory Visit: Payer: Self-pay | Admitting: Allergy and Immunology

## 2023-05-11 ENCOUNTER — Ambulatory Visit (HOSPITAL_COMMUNITY): Payer: PPO | Admitting: Student

## 2023-05-14 ENCOUNTER — Encounter: Payer: Self-pay | Admitting: Podiatry

## 2023-05-14 ENCOUNTER — Ambulatory Visit (INDEPENDENT_AMBULATORY_CARE_PROVIDER_SITE_OTHER): Payer: PPO

## 2023-05-14 ENCOUNTER — Ambulatory Visit: Payer: PPO | Admitting: Podiatry

## 2023-05-14 DIAGNOSIS — M2041 Other hammer toe(s) (acquired), right foot: Secondary | ICD-10-CM | POA: Diagnosis not present

## 2023-05-14 DIAGNOSIS — L97511 Non-pressure chronic ulcer of other part of right foot limited to breakdown of skin: Secondary | ICD-10-CM | POA: Diagnosis not present

## 2023-05-14 DIAGNOSIS — M2042 Other hammer toe(s) (acquired), left foot: Secondary | ICD-10-CM | POA: Diagnosis not present

## 2023-05-14 DIAGNOSIS — M722 Plantar fascial fibromatosis: Secondary | ICD-10-CM

## 2023-05-14 DIAGNOSIS — M778 Other enthesopathies, not elsewhere classified: Secondary | ICD-10-CM

## 2023-05-14 DIAGNOSIS — M545 Low back pain, unspecified: Secondary | ICD-10-CM | POA: Insufficient documentation

## 2023-05-14 MED ORDER — TRIAMCINOLONE ACETONIDE 40 MG/ML IJ SUSP
20.0000 mg | Freq: Once | INTRAMUSCULAR | Status: AC
Start: 2023-05-14 — End: 2023-05-14
  Administered 2023-05-14: 20 mg

## 2023-05-14 NOTE — Progress Notes (Signed)
Subjective:  Patient ID: Penny Porter, female    DOB: [birth year],  MRN: [id number] HPI Chief Complaint  Patient presents with   Foot Pain    LEFT: arch pain and lateral foot - aching x several months, Dr. Ophelia Charter gave insoles to try RIGHT: 3rd toe - hammertoe, gets a callus on the tip, has seen podiatry with [facility] and they trim nails and calluses   New Patient (Initial Visit)    57 y.o. female presents with the above complaint.   ROS: Denies fever chills nausea vomiting muscle aches pains calf pain back pain chest pain shortness of breath  Past Medical History:  Diagnosis Date   Anxiety    Arthritis    Back and hip   Asthma    Bursitis    Complication of anesthesia    bladder hard to wake up, never needed a in and out cath   Costochondral chest pain    Degenerative disk disease    Depression    Gastroparesis    GERD (gastroesophageal reflux disease)    Hypertension    Lumbar herniated disc    Premature atrial beats 12/2019   Sleep apnea    not using CPAP   Stenosis of lumbosacral spine    Stress fracture of foot    Thyroid nodule    Tourette disease    Trigeminal nerve disease    Past Surgical History:  Procedure Laterality Date   BACK SURGERY  09/19/2015   bone spur removal on back     CHOLECYSTECTOMY  2005   COLONOSCOPY     DILATION AND CURETTAGE OF UTERUS  11/13/2022   ELBOW SURGERY Left 05/29/2014   ESOPHAGOGASTRODUODENOSCOPY     KNEE SURGERY  06/2016   polyp removal     POSTERIOR FUSION LUMBAR SPINE  06/26/2017   ruptured disk  2008   TEAR DUCT PROBING  2009    Current Outpatient Medications:    Acetaminophen (TYLENOL PO), Take 1 tablet by mouth daily as needed (PAIN)., Disp: , Rfl:    albuterol (PROVENTIL) (2.5 MG/3ML) 0.083% nebulizer solution, CAN USE ONE VIAL IN NEBULIZER EVERY FOUR TO SIX HOURS AS NEEDED FOR COUGH OR WHEEZE., Disp: 150 mL, Rfl: 1   albuterol (VENTOLIN HFA) 108 (90 Base) MCG/ACT inhaler, INHALE 2 PUFFS INTO THE LUNGS EVERY 4 HOURS AS  NEEDED FOR WHEEZING OR SHORTNESS OF BREATH., Disp: 18 g, Rfl: 1   allopurinol (ZYLOPRIM) 100 MG tablet, Take by mouth 2 (two) times daily. , Disp: , Rfl:    ARIPiprazole (ABILIFY) 30 MG tablet, Take 1 tablet (30 mg total) by mouth daily., Disp: 90 tablet, Rfl: 1   aspirin 81 MG tablet, Take 81 mg by mouth daily., Disp: , Rfl:    azithromycin (ZITHROMAX) 250 MG tablet, TAKE 1 TABLET BY MOUTH THREE TIMES A WEEK., Disp: 12 tablet, Rfl: 5   buPROPion (WELLBUTRIN XL) 300 MG 24 hr tablet, Take 1 tablet (300 mg total) by mouth daily., Disp: 90 tablet, Rfl: 1   Cholecalciferol (VITAMIN D-3) 5000 UNITS TABS, Take 5,000 Units by mouth daily. , Disp: , Rfl:    clotrimazole-betamethasone (LOTRISONE) cream, SMARTSIG:sparingly Topical Twice Daily, Disp: , Rfl:    famotidine (PEPCID) 20 MG tablet, Take 1 tablet (20 mg total) by mouth 2 (two) times daily., Disp: 180 tablet, Rfl: 1   flecainide (TAMBOCOR) 50 MG tablet, SMARTSIG:1 Tablet(s) By Mouth Every 12 Hours, Disp: , Rfl:    furosemide (LASIX) 20 MG tablet, Take 20 mg by mouth daily.,  Disp: , Rfl:    hydrOXYzine (ATARAX) 10 MG tablet, Take 1 tablet (10 mg total) by mouth 3 (three) times daily as needed for anxiety., Disp: 270 tablet, Rfl: 1   ipratropium (ATROVENT) 0.03 % nasal spray, USE 2 SPRAYS IN NOSTRIL (S) 2 TIMES DAILY AS DIRECTED, Disp: , Rfl: 11   ketoconazole (NIZORAL) 2 % shampoo, Apply 1 application topically as needed for irritation. , Disp: , Rfl:    loratadine (CLARITIN) 10 MG tablet, Take 10 mg by mouth at bedtime. , Disp: , Rfl:    losartan (COZAAR) 50 MG tablet, Take 50 mg by mouth daily., Disp: , Rfl:    lubiprostone (AMITIZA) 24 MCG capsule, Take 24 mcg by mouth daily as needed for constipation. , Disp: , Rfl:    meclizine (ANTIVERT) 25 MG tablet, PLEASE SEE ATTACHED FOR DETAILED DIRECTIONS, Disp: , Rfl:    meloxicam (MOBIC) 7.5 MG tablet, Take 7.5 mg by mouth 2 (two) times daily., Disp: , Rfl: 0   methocarbamol (ROBAXIN) 500 MG tablet,  Take 500 mg by mouth 4 (four) times daily., Disp: , Rfl:    metoprolol succinate (TOPROL-XL) 25 MG 24 hr tablet, Take by mouth 3 (three) times daily. , Disp: , Rfl:    Mometasone Furoate (ASMANEX HFA) 200 MCG/ACT AERO, Inhale two puffs once daily as directed.  Rinse, gargle, and spit after use., Disp: 39 g, Rfl: 1   montelukast (SINGULAIR) 10 MG tablet, Take 1 tablet (10 mg total) by mouth daily., Disp: 90 tablet, Rfl: 1   mupirocin ointment (BACTROBAN) 2 %, Place 1 application into the nose 2 (two) times daily as needed (IRRITATION). , Disp: , Rfl:    ondansetron (ZOFRAN-ODT) 4 MG disintegrating tablet, Take 4 mg by mouth 2 (two) times daily as needed for nausea or vomiting., Disp: , Rfl:    pregabalin (LYRICA) 75 MG capsule, Take 75 mg by mouth 2 (two) times daily. Reported on 10/09/2015, Disp: , Rfl:    Spacer/Aero-Holding Chambers (AEROCHAMBER PLUS) inhaler, Use as instructed with inhaler., Disp: 1 each, Rfl: 2   spironolactone (ALDACTONE) 25 MG tablet, Take 25 mg by mouth daily., Disp: , Rfl:    Tiotropium Bromide Monohydrate (SPIRIVA RESPIMAT) 1.25 MCG/ACT AERS, Inhale 2 puffs into the lungs daily., Disp: 12 g, Rfl: 1   traMADol (ULTRAM) 50 MG tablet, Take 50 mg by mouth., Disp: , Rfl:    triamcinolone cream (KENALOG) 0.1 %, Apply 1 application topically 2 (two) times daily as needed (IRRITATION). , Disp: , Rfl:    vitamin B-12 (CYANOCOBALAMIN) 1000 MCG tablet, Take 1,000 mcg by mouth daily., Disp: , Rfl:   Allergies  Allergen Reactions   Amoxicillin Hives and Swelling    LIP SWELLING PATIENT HAS HAD A PCN REACTION WITH IMMEDIATE RASH, FACIAL/TONGUE/THROAT SWELLING, SOB, OR LIGHTHEADEDNESS WITH HYPOTENSION:  #  #  #  YES  #  #  #   HAS PT DEVELOPED SEVERE RASH INVOLVING MUCUS MEMBRANES or SKIN NECROSIS: #  #  #  YES  #  #  #  Has patient had a PCN reaction that required hospitalization: No Has patient had a PCN reaction occurring within the last 10 years: Unknown If all of the above  answers are "NO", then may proceed with Cephalosporin use.    Avelox [Moxifloxacin Hcl In Nacl] Other (See Comments)    Caused pain in shoulders to finger, tendonitis   Adhesive [Tape] Dermatitis    Blisters skin   Bextra [Valdecoxib] Hives and Swelling  Doxycycline Other (See Comments)   Doxycycline Hyclate Hives and Swelling   Gadobutrol Hives   Gadolinium Derivatives Hives   Gadoversetamide Hives   Iodinated Contrast Media Hives   Lamictal [Lamotrigine] Hives   Naproxen Hypertension   Oxycodone-Acetaminophen Other (See Comments)   Percolone [Oxycodone] Hives   Relafen [Nabumetone] Hypertension   Risperdal [Risperidone] Other (See Comments)    Leg weakness    Nitrofurantoin Other (See Comments)   Brintellix [Vortioxetine] Nausea And Vomiting   Fetzima [Levomilnacipran] Other (See Comments)    Tremors across head    Moxifloxacin Other (See Comments)    Caused pain in shoulders to finger, tendonitis   Sulfamethoxazole-Trimethoprim Rash    Flushing and rash   Review of Systems Objective:  There were no vitals filed for this visit.  General: Well developed, nourished, in no acute distress, alert and oriented x3   Dermatological: Skin is warm, dry and supple bilateral. Nails x 10 are well maintained; remaining integument appears unremarkable at this time. There are no open sores, no preulcerative lesions, no rash or signs of infection present.  Vascular: Dorsalis Pedis artery and Posterior Tibial artery pedal pulses are 1 4 bilateral with delayed capillary fill time. Pedal hair growth absent.  No varicosities and no lower extremity edema present bilateral.   Neruologic: Grossly intact via light touch bilateral. Vibratory intact via tuning fork bilateral. Protective threshold with Semmes Wienstein monofilament absent to all pedal sites bilateral. Patellar and Achilles deep tendon reflexes 2+ bilateral. No Babinski or clonus noted bilateral.   Musculoskeletal: No gross boney  pedal deformities bilateral. No pain, crepitus, or limitation noted with foot and ankle range of motion bilateral. Muscular strength 5/5 in all groups tested bilateral.  Semirigid hammertoe deformity lesser digits right foot less degree left foot  She has pain on palpation medial calcaneal tubercle of the left heel.  Gait: Unassisted, Nonantalgic.    Radiographs:  Radiographs taken today demonstrate an osseously mature individual demineralized bone generally.  She has hammertoe deformities no signs of osteomyelitic change third digit of the right foot does demonstrate some edema soft tissue left foot does demonstrate soft tissue increase in density plantar fascial cancer slight spurring.  Assessment & Plan:   Assessment: Hammertoe deformity third right with an ulcerative lesion which I debrided today.  Also plantar fasciitis left.  Plan: Debrided ulcer third digit right foot.  Placed dressing which I instructed her to do daily I also instructed that she wear her buttress pad.  As long as everything is healed up in 2 weeks we will do a flexor tenotomy.  I injected 10 mg of Kenalog into her left heel today with local anesthetic tolerated procedure well she understands and is amenable to it.     Lorianne Malbrough T. Sarita, [place]

## 2023-05-18 ENCOUNTER — Ambulatory Visit (HOSPITAL_COMMUNITY): Payer: PPO | Admitting: Student

## 2023-05-26 ENCOUNTER — Ambulatory Visit: Payer: PPO | Admitting: Podiatry

## 2023-05-28 ENCOUNTER — Ambulatory Visit: Payer: PPO | Admitting: Podiatry

## 2023-06-05 DIAGNOSIS — S8990XA Unspecified injury of unspecified lower leg, initial encounter: Secondary | ICD-10-CM | POA: Insufficient documentation

## 2023-06-08 ENCOUNTER — Ambulatory Visit (HOSPITAL_COMMUNITY): Payer: PPO | Admitting: Student

## 2023-06-18 ENCOUNTER — Ambulatory Visit: Payer: PPO | Admitting: Podiatry

## 2023-06-18 ENCOUNTER — Encounter: Payer: Self-pay | Admitting: Podiatry

## 2023-06-18 DIAGNOSIS — M2041 Other hammer toe(s) (acquired), right foot: Secondary | ICD-10-CM | POA: Diagnosis not present

## 2023-06-18 DIAGNOSIS — M79676 Pain in unspecified toe(s): Secondary | ICD-10-CM

## 2023-06-18 DIAGNOSIS — B351 Tinea unguium: Secondary | ICD-10-CM | POA: Diagnosis not present

## 2023-06-18 DIAGNOSIS — L97511 Non-pressure chronic ulcer of other part of right foot limited to breakdown of skin: Secondary | ICD-10-CM | POA: Diagnosis not present

## 2023-06-18 NOTE — Progress Notes (Signed)
She presents today for follow-up of her ulceration third digit of the right foot.  States that she was unable to wear the crest pad because it pushes her toes up into the top of her foot and with her neuropathy is very uncomfortable.  She will say that she would like to have her nails trimmed.  When I asked about her third toe if she dresses and cleans it daily she states that occasionally they are able to do that but she is unable to reach it herself.  Objective: Vital signs stable oriented x 3.  Hammertoe deformity third digit right foot physical nature does demonstrate mild erythema and a reactive hyper keratoma to the distal aspect of the toe which once debrided today does demonstrate ulcerative lesion with small amount of purulence measuring about 0.2 cm.  I debrided all reactive hyperkeratotic tissue as well as all necrotic tissue.  Assessment: Flexible medical mallet toe deformity with ulcerative lesion.  Third digit right pain in limb secondary to onychomycosis and peripheral neuropathy nondiabetic.  Plan: Discussed etiology pathology conservative versus surgical therapies.  At this point she is not diabetic so she does not qualify for diabetic shoe gear.  We do need to discuss shoes which we did today and extra-depth shoes so that she could wear her sulcus pad without rubbing another lesion on the top of her toes.  She needs to dress this daily which I showed her how to do today as I placed padding and dressing and antibiotic ointment.  I debrided her nails 1 through 5 bilateral.  I will follow-up with her in a few weeks.

## 2023-07-21 ENCOUNTER — Encounter: Payer: Self-pay | Admitting: Podiatry

## 2023-07-21 ENCOUNTER — Ambulatory Visit: Payer: PPO | Admitting: Podiatry

## 2023-07-21 DIAGNOSIS — L97511 Non-pressure chronic ulcer of other part of right foot limited to breakdown of skin: Secondary | ICD-10-CM

## 2023-07-21 DIAGNOSIS — M2041 Other hammer toe(s) (acquired), right foot: Secondary | ICD-10-CM

## 2023-07-21 NOTE — Progress Notes (Signed)
She presents today for follow-up of ulceration third digit of her right foot.  There is no erythema edema cellulitis drainage or odor.  Objective: Pulses palpable capillary fill time is immediate multiple varicosities are noted semirigid hammertoe deformity third right resulting in distal ulcerative lesion.  Assessment: Ulceration third right.  Plan: At this point I debrided the wound today apply silver nitrate and Silvadene cream with a dry sterile dressing and unguinal follow-up with her in about 3 weeks for tenotomy at the end of the day.  Will do a flexor tenotomy to the third digit right foot

## 2023-07-27 ENCOUNTER — Ambulatory Visit (HOSPITAL_COMMUNITY): Payer: PPO | Admitting: Student

## 2023-07-27 VITALS — BP 156/98 | HR 73

## 2023-07-27 DIAGNOSIS — F3341 Major depressive disorder, recurrent, in partial remission: Secondary | ICD-10-CM | POA: Diagnosis not present

## 2023-07-27 DIAGNOSIS — F411 Generalized anxiety disorder: Secondary | ICD-10-CM

## 2023-07-27 DIAGNOSIS — F952 Tourette's disorder: Secondary | ICD-10-CM | POA: Diagnosis not present

## 2023-07-27 MED ORDER — HYDROXYZINE HCL 10 MG PO TABS: 10.0000 mg | ORAL_TABLET | Freq: Three times a day (TID) | ORAL | 0 refills | Status: AC | PRN

## 2023-07-27 MED ORDER — BUPROPION HCL ER (XL) 300 MG PO TB24
300.0000 mg | ORAL_TABLET | Freq: Every day | ORAL | 0 refills | Status: DC
Start: 2023-07-27 — End: 2023-10-28

## 2023-07-27 MED ORDER — ARIPIPRAZOLE 30 MG PO TABS: 30.0000 mg | ORAL_TABLET | Freq: Every day | ORAL | 0 refills | Status: DC

## 2023-07-27 NOTE — Progress Notes (Addendum)
BH MD Outpatient Progress Note  07/27/2023 1:59 PM Penny Porter  MRN: [id number]  Assessment:  Penny Porter presents for follow-up evaluation in-person.   Identifying Information: Penny Porter is a 57 y.o. female with a documented history of MDD, GAD, no suicide attempts or inpatient psych admission, who is an established patient with [facility] for management of mood.   Risk Assessment: An assessment of suicide and violence risk factors was performed as part of this evaluation and is not significantly changed from the last visit.             While future psychiatric events cannot be accurately predicted, the patient does not currently require acute inpatient psychiatric care and does not currently meet [facility] involuntary commitment criteria.          Plan:  # MDD in remission # GAD Past medication trials:  Status of problem: new to me Previously followed by Dr. Michae Kava until 01/2023.  Had been on same meds at same dose for years. No longer meets criteria for MDD (see my 07/27/2023 note).  Given mood stability and that it is our first encounter, no med changes at this time.  Interventions: Continued home Wellbutrin 300 mg qAM Continued home hydroxyzine 10 mg BID PRN Continued home Abilify 30 mg qAM  # Tourettes d/o Past medication trials:  Status of problem: new to me Well controlled currently with abilify. Does still have motor tic in abdominals. Interventions: Continued home Abilify 30 mg qAM  # Resting Tremor vs EPS Past medication trials:  Status of problem: new to me Per patient, Parkinson's disorder has been r/o.  Possible EPS from abilify. Will inquire further. Interventions: Inquire further on next encounter  Health Maintenance PCP: Gordan Payment., MD   Return to care in: Future Appointments  Date Time Provider Department Center  08/11/2023 12:30 PM [place], [place] T, [place] [facility]  10/28/2023 10:30 AM Princess Bruins, DO  [facility] None  12/28/2023 10:00 AM [place]oz[place], Alvira Philips, MD [facility] None    Patient was given contact information for behavioral health clinic and was instructed to call 911 for emergencies.    Patient and plan of care will be discussed with the Attending MD, who agrees with the above statement and plan.   Subjective:  Chief Complaint:  Chief Complaint  Patient presents with   Follow-up   Medication Refill    Interval History:   PDMP: lyrica 75 mg x90caps for 30d last dispensed 06/20/2023  Accompanied with friend who stayed in the waiting room.  She was unaccompanied during evaluation  Her biggest concern are her back pain and limited mobility.   Dr. Michae Kava had been treating her for Tourette's, tremors, and feels that she started to have memory issues.  Pain Pain is worse over certain time.  She has DDD, that progressed over time, causing low back pain.  Has neuropathy in her feet and legs.  Which she is on Lyrica for.  Feels that Lyrica is causing her hot flashes to be worse.  She is currently perimenopausal. Currently she is on muscle relaxers, has taken tramadol.  She has received injections in the past, she found it was not helpful.  Back surgery in the past has helped, however when presented again with this as a possible treatment, she does not want to go through with it, feels like she may not do well after the surgery. Tried PT, but unable to go through with the exercises and activities.  So she stopped going.  In her day-to-day, she is limited in what she does, due to nearly being completely wheelchair-bound.  She can walk a few steps, however pain becomes unbearable.  She lives with her sister x 2, with one of them taking care of her and her other sister. They have a good relationship.  They have been living in this home for about 30 years.  Tics Had stress since childhood, used to occur in her arms and have stomach jerks.  She had verbal tics when she was a child, happened  when she was in school.  But she was not officially diagnosed up until her uncle's death, which exacerbate her tics.  Has been treated with Abilify for quite sometime, to good effect, now only symptom left is abdominal "jerks".  There has been trial of decreasing Abilify, which led to reemergence of her tics.  At times she may have some lip twitches.  Tremors Has tremors that are present at rest, none with activity, have not present for the past 2 years.  Unclear etiology at this time, her neurologist have ruled out Parkinson's disease. She also mentions there has been some concerns with memory, has been progressing slowly.  Mood She has been on Wellbutrin, hydroxyzine, Abilify for mood as well for years.  No medication changes for years.  Feels that she is functional with these current medications.  Feels that she is stable in regards to depression and anxiety. Her depression is much improved compared to the past.  She described in the past there was significant tearfulness. Currently her sleep is good, feels rested, only time she does not sleep well as when she is anticipating something for the next day.  However she was able to sleep well last night, even though anticipated for this appointment. Denied feeling depressed, denied anhedonia, denied low energy, denied change in appetite.  Denied ever having active or passive SI, denied having any currently.  She finds joy in watching TV, coloring, reading.  Anxiety is also much improved compared to the past, but currently, her mind has been preoccupied on her pain to worry about other things.  She takes hydroxyzine twice a day for anxiety, adherent to this.  Denied panic attacks, last one was a long time ago.  Hypo-/mania Denied ever having abnormally increased energy and decreased need for sleep for multiple days in a row.  Denied grandiosity.  Trauma Denied any history of trauma during childhood or adulthood.  She grew up with both of her parents  and siblings (x 2 sister, x1 brother)  Psychosis Denied ever having AVH  No current EtOH, smoking, or substance use.  Discussed medication options for her concerns, however she is content with her current medications, feels that no change are needed.  Patient amenable to plan per above after discussing the risks, benefits, and side effects. Otherwise patient had no other questions or concerns and was amenable to plan per above.  Safety: Denied active and passive SI, HI, AVH.  Denied access to guns or weapons.  Review of Systems  Constitutional:  Positive for activity change. Negative for appetite change and fatigue.  Respiratory:  Positive for apnea.   Cardiovascular:  Positive for palpitations and leg swelling.  Endocrine: Positive for heat intolerance.  Musculoskeletal:  Positive for arthralgias, back pain, gait problem and joint swelling.  Neurological:  Positive for tremors and weakness. Negative for dizziness and seizures.    Visit Diagnosis:    ICD-10-CM   1. Generalized  anxiety disorder  F41.1 hydrOXYzine (ATARAX) 10 MG tablet    2. MDD (major depressive disorder), recurrent, in partial remission (HCC)  F33.41 hydrOXYzine (ATARAX) 10 MG tablet    buPROPion (WELLBUTRIN XL) 300 MG 24 hr tablet    ARIPiprazole (ABILIFY) 30 MG tablet    3. Tourette's disease  F95.2 ARIPiprazole (ABILIFY) 30 MG tablet      Past Psychiatric History:  Diagnoses: MDD, GAD, historical Tourette's disease Medication trials:  Lamictal (allergic rxn - ), risperdal (side effects, leg weakness), brintellix (side effects, GI), clonidine (ineffective),  Clonazepam (PRN 2015-2016), vistaril, cogentin (side effects constipation, urinary hesitancy) Current: Abilify (2015-current), wellbutrin (450 mg caused HTN, 2015-current) Previous psychiatrist/therapist:  Oletta Darter, MD (2015-2024), [facility] Resis (2024-current) Hospitalizations: Denied  Suicide attempts: Denied  SIB: Denied Hx of violence  towards others: Denied Current access to guns: Denied Trauma/abuse: Denied  Substance Use History: EtOH:  reports no history of alcohol use. Nicotine:  reports that she has never smoked. She has never used smokeless tobacco. Marijuana: Denied IV drug use: Denied Stimulants: Denied Opiates: Denied Sedative/hypnotics: Denied Hallucinogens: Denied DT: Denied Detox: Denied Residential: Denied   Past Medical History: Dx:  has a past medical history of Anxiety, Arthritis, Asthma, Bursitis, Complication of anesthesia, Costochondral chest pain, Degenerative disk disease, Depression, Gastroparesis, GERD (gastroesophageal reflux disease), Hypertension, Lumbar herniated disc, Premature atrial beats (12/2019), Sleep apnea, Stenosis of lumbosacral spine, Stress fracture of foot, Thyroid nodule, Tourette disease, and Trigeminal nerve disease.  Head trauma: Denied Seizures: Denied Allergies: Amoxicillin, Avelox [moxifloxacin hcl in nacl], Adhesive [tape], Bextra [valdecoxib], Doxycycline, Doxycycline hyclate, Gadobutrol, Gadolinium derivatives, Gadoversetamide, Iodinated contrast media, Lamictal [lamotrigine], Naproxen, Oxycodone-acetaminophen, Percolone [oxycodone], Relafen [nabumetone], Risperdal [risperidone], Nitrofurantoin, Brintellix [vortioxetine], Fetzima [levomilnacipran], Moxifloxacin, and Sulfamethoxazole-trimethoprim   Family Psychiatric History:  Denied psychiatric family history Others: sisterwith IDD, dad with dementia  Social History: Current Place of Residence: [place] of Birth: [place] Family Members: living with 2 sisters Brother deceased - 08-05-21 Mom deceased - 08-05-2017 Dad decreased - 2014 Has adult nibling and younger grand-niblings Marital Status:  Single, never married Children: denies Relationships: denies Education:  Designer, television/film set, CNA Religious Beliefs/Practices: Goes to church, if physically unable, she will watch on TV History of Abuse: none 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 CNA Currently unemployed, disabilities since 2014-08-05 Legal History: denies Hobbies/Interests: reading, coloring, TV  Past Medical History:  Past Medical History:  Diagnosis Date   Anxiety    Arthritis    Back and hip   Asthma    Bursitis    Complication of anesthesia    bladder hard to wake up, never needed a in and out cath   Costochondral chest pain    Degenerative disk disease    Depression    Gastroparesis    GERD (gastroesophageal reflux disease)    Hypertension    Lumbar herniated disc    Premature atrial beats 12/2019   Sleep apnea    not using CPAP   Stenosis of lumbosacral spine    Stress fracture of foot    Thyroid nodule    Tourette disease    Trigeminal nerve disease     Past Surgical History:  Procedure Laterality Date   BACK SURGERY  09/19/2015   bone spur removal on back     CHOLECYSTECTOMY  Aug 05, 2004   COLONOSCOPY     DILATION AND CURETTAGE  OF UTERUS  11/13/2022   ELBOW SURGERY Left 05/29/2014   ESOPHAGOGASTRODUODENOSCOPY     KNEE SURGERY  06/2016   polyp removal     POSTERIOR FUSION LUMBAR SPINE  06/26/2017   ruptured disk  2008   TEAR DUCT PROBING  2009   Family History:  Family History  Problem Relation Age of Onset   Mental retardation Sister    Dementia Father    Cancer Mother    Cancer Brother        colorectal   Suicidality Neg Hx    Social History   Socioeconomic History   Marital status: Single    Spouse name: Not on file   Number of children: Not on file   Years of education: Not on file   Highest education level: High school graduate  Occupational History    Comment: disabled   Tobacco Use   Smoking status: Never   Smokeless tobacco: Never  Vaping Use   Vaping status: Never Used  Substance and Sexual Activity   Alcohol use: No   Drug use: No   Sexual activity: Not Currently  Other Topics  Concern   Not on file  Social History Narrative   Lives with 2 sisters   Caffeine- none   Social Drivers of Corporate investment banker Strain: Not on file  Food Insecurity: Low Risk  (05/25/2023)   Received from [facility]   Hunger Vital Sign    Worried About Running Out of Food in the Last Year: Never true    Ran Out of Food in the Last Year: Never true  Transportation Needs: No Transportation Needs (05/25/2023)   Received from [facility]    In the past 12 months, has lack of reliable transportation kept you from medical appointments, meetings, work or from getting things needed for daily living? : No  Physical Activity: Not on file  Stress: Not on file  Social Connections: Not on file    Allergies:  Allergies  Allergen Reactions   Amoxicillin Hives and Swelling    LIP SWELLING PATIENT HAS HAD A PCN REACTION WITH IMMEDIATE RASH, FACIAL/TONGUE/THROAT SWELLING, SOB, OR LIGHTHEADEDNESS WITH HYPOTENSION:  #  #  #  YES  #  #  #   HAS PT DEVELOPED SEVERE RASH INVOLVING MUCUS MEMBRANES or SKIN NECROSIS: #  #  #  YES  #  #  #  Has patient had a PCN reaction that required hospitalization: No Has patient had a PCN reaction occurring within the last 10 years: Unknown If all of the above answers are "NO", then may proceed with Cephalosporin use.    Avelox [Moxifloxacin Hcl In Nacl] Other (See Comments)    Caused pain in shoulders to finger, tendonitis   Adhesive [Tape] Dermatitis    Blisters skin   Bextra [Valdecoxib] Hives and Swelling   Doxycycline Other (See Comments)   Doxycycline Hyclate Hives and Swelling   Gadobutrol Hives   Gadolinium Derivatives Hives   Gadoversetamide Hives   Iodinated Contrast Media Hives   Lamictal [Lamotrigine] Hives   Naproxen Hypertension   Oxycodone-Acetaminophen Other (See Comments)   Percolone [Oxycodone] Hives   Relafen [Nabumetone] Hypertension   Risperdal [Risperidone] Other (See Comments)    Leg weakness     Nitrofurantoin Other (See Comments)   Brintellix [Vortioxetine] Nausea And Vomiting   Fetzima [Levomilnacipran] Other (See Comments)    Tremors across head    Moxifloxacin Other (See Comments)    Caused pain  in shoulders to finger, tendonitis   Sulfamethoxazole-Trimethoprim Rash    Flushing and rash    Current Medications: Current Outpatient Medications  Medication Sig Dispense Refill   Acetaminophen (TYLENOL PO) Take 1 tablet by mouth daily as needed (PAIN).     albuterol (PROVENTIL) (2.5 MG/3ML) 0.083% nebulizer solution CAN USE ONE VIAL IN NEBULIZER EVERY FOUR TO SIX HOURS AS NEEDED FOR COUGH OR WHEEZE. 150 mL 1   albuterol (VENTOLIN HFA) 108 (90 Base) MCG/ACT inhaler INHALE 2 PUFFS INTO THE LUNGS EVERY 4 HOURS AS NEEDED FOR WHEEZING OR SHORTNESS OF BREATH. 18 g 1   allopurinol (ZYLOPRIM) 100 MG tablet Take by mouth 2 (two) times daily.      ARIPiprazole (ABILIFY) 30 MG tablet Take 1 tablet (30 mg total) by mouth daily. 90 tablet 0   aspirin 81 MG tablet Take 81 mg by mouth daily.     azithromycin (ZITHROMAX) 250 MG tablet TAKE 1 TABLET BY MOUTH THREE TIMES A WEEK. 12 tablet 5   buPROPion (WELLBUTRIN XL) 300 MG 24 hr tablet Take 1 tablet (300 mg total) by mouth daily. 90 tablet 0   Cholecalciferol (VITAMIN D-3) 5000 UNITS TABS Take 5,000 Units by mouth daily.      clotrimazole-betamethasone (LOTRISONE) cream SMARTSIG:sparingly Topical Twice Daily     famotidine (PEPCID) 20 MG tablet Take 1 tablet (20 mg total) by mouth 2 (two) times daily. 180 tablet 1   flecainide (TAMBOCOR) 50 MG tablet SMARTSIG:1 Tablet(s) By Mouth Every 12 Hours     furosemide (LASIX) 20 MG tablet Take 20 mg by mouth daily.     hydrOXYzine (ATARAX) 10 MG tablet Take 1 tablet (10 mg total) by mouth 3 (three) times daily as needed for anxiety. 270 tablet 0   ipratropium (ATROVENT) 0.03 % nasal spray USE 2 SPRAYS IN NOSTRIL (S) 2 TIMES DAILY AS DIRECTED  11   ketoconazole (NIZORAL) 2 % shampoo Apply 1 application  topically as needed for irritation.      loratadine (CLARITIN) 10 MG tablet Take 10 mg by mouth at bedtime.      losartan (COZAAR) 50 MG tablet Take 50 mg by mouth daily.     lubiprostone (AMITIZA) 24 MCG capsule Take 24 mcg by mouth daily as needed for constipation.      meclizine (ANTIVERT) 25 MG tablet PLEASE SEE ATTACHED FOR DETAILED DIRECTIONS     meloxicam (MOBIC) 7.5 MG tablet Take 7.5 mg by mouth 2 (two) times daily.  0   methocarbamol (ROBAXIN) 500 MG tablet Take 500 mg by mouth 4 (four) times daily.     metoprolol succinate (TOPROL-XL) 25 MG 24 hr tablet Take by mouth 3 (three) times daily.      Mometasone Furoate (ASMANEX HFA) 200 MCG/ACT AERO Inhale two puffs once daily as directed.  Rinse, gargle, and spit after use. 39 g 1   montelukast (SINGULAIR) 10 MG tablet Take 1 tablet (10 mg total) by mouth daily. 90 tablet 1   mupirocin ointment (BACTROBAN) 2 % Place 1 application into the nose 2 (two) times daily as needed (IRRITATION).      ondansetron (ZOFRAN-ODT) 4 MG disintegrating tablet Take 4 mg by mouth 2 (two) times daily as needed for nausea or vomiting.     pregabalin (LYRICA) 75 MG capsule Take 75 mg by mouth 2 (two) times daily. Reported on 10/09/2015     Spacer/Aero-Holding Chambers (AEROCHAMBER PLUS) inhaler Use as instructed with inhaler. 1 each 2   spironolactone (ALDACTONE) 25 MG tablet  Take 25 mg by mouth daily.     Tiotropium Bromide Monohydrate (SPIRIVA RESPIMAT) 1.25 MCG/ACT AERS Inhale 2 puffs into the lungs daily. 12 g 1   traMADol (ULTRAM) 50 MG tablet Take 50 mg by mouth.     triamcinolone cream (KENALOG) 0.1 % Apply 1 application topically 2 (two) times daily as needed (IRRITATION).      vitamin B-12 (CYANOCOBALAMIN) 1000 MCG tablet Take 1,000 mcg by mouth daily.     No current facility-administered medications for this visit.    Objective: Psychiatric Specialty Exam: Blood pressure (!) 156/98, pulse 73.There is no height or weight on file to calculate BMI.   General Appearance: Casual, faily groomed  Eye Contact:  Good    Speech:  Clear, coherent, normal rate   Volume:  Normal   Mood:  see above  Affect:  Appropriate, congruent, full range  Thought Content: Logical, rumination  Suicidal Thoughts: see above  Thought Process:  Coherent, goal-directed, linear   Orientation:  A&Ox4   Memory:  Immediate good  Judgment:  Fair   Insight:  Fair   Concentration:  Attention and concentration good   Recall:  Good  Fund of Knowledge: Good  Language: Good, fluent  Psychomotor Activity: decreased. B/l resting tremors in hands. None on movement  Akathisia:  NA   AIMS (if indicated): NA   Assets:  Communication Skills Desire for Improvement Financial Resources/Insurance Housing Leisure Time Resilience Social Support Talents/Skills  ADL's:  Intact  Cognition: WNL  Sleep:  see above    Physical Exam Vitals and nursing note reviewed.  Constitutional:      General: She is awake. She is not in acute distress.    Appearance: She is ill-appearing. She is not toxic-appearing or diaphoretic.  HENT:     Head: Normocephalic and atraumatic.  Eyes:     Conjunctiva/sclera: Conjunctivae normal.  Pulmonary:     Effort: Pulmonary effort is normal. No respiratory distress.  Neurological:     General: No focal deficit present.     Mental Status: She is alert and oriented to person, place, and time.     Gait: Gait abnormal.      Metabolic Disorder Labs: No results found for: "HGBA1C", "MPG" Lab Results  Component Value Date   PROLACTIN 13.1 06/09/2017   No results found for: "CHOL", "TRIG", "HDL", "CHOLHDL", "VLDL", "LDLCALC" No results found for: "TSH"  Therapeutic Level Labs: No results found for: "LITHIUM" No results found for: "VALPROATE" No results found for: "CBMZ"  Screenings: PHQ2-9    Flowsheet Row [facility] from [facility] Visit from 03/13/2022 [facility] Office Visit from 11[facility]14/2021 [facility] Video Visit from 07/25/2021 [facility]  PHQ-2 Total Score 1 1 0 1 1      Flowsheet Row [facility]/29/2023 [facility] Office Visit from 09/11/2022 in [facility] Office Visit from 03/13/2022 in [facility]  C-SSRS RISK CATEGORY No Risk No Risk No Risk       Collaboration of Care: Collaboration of Care: see above  Patient/Guardian was advised Release of Information must be obtained prior to any record release in order to collaborate their care with an outside provider. Patient/Guardian was advised if they have not already done so to contact the registration department to sign  all necessary forms in order for [place] to release information regarding their care.   Consent: Patient/Guardian gives verbal consent for treatment and assignment of benefits for services provided during this visit. Patient/Guardian expressed understanding and agreed to proceed.   60 minutes were spent in chart review, interview, psycho education, counseling, medical decision making, coordination of care and long-term prognosis.  Patient was given opportunity to ask question and all concerns and questions were addressed and answers. Excluding separately billable services.   Princess Bruins, DO Psych Resident, PGY-3

## 2023-07-28 ENCOUNTER — Other Ambulatory Visit (HOSPITAL_COMMUNITY): Payer: Self-pay | Admitting: Student

## 2023-07-28 ENCOUNTER — Encounter (HOSPITAL_COMMUNITY): Payer: Self-pay | Admitting: Student

## 2023-07-28 DIAGNOSIS — F411 Generalized anxiety disorder: Secondary | ICD-10-CM

## 2023-07-28 DIAGNOSIS — F3341 Major depressive disorder, recurrent, in partial remission: Secondary | ICD-10-CM

## 2023-07-28 NOTE — Addendum Note (Signed)
Addended by: Everlena Cooper on: 07/28/2023 03:00 PM   Modules accepted: Level of Service

## 2023-08-11 ENCOUNTER — Ambulatory Visit: Payer: PPO | Admitting: Podiatry

## 2023-08-25 ENCOUNTER — Ambulatory Visit: Payer: PPO | Admitting: Podiatry

## 2023-09-10 ENCOUNTER — Ambulatory Visit: Payer: PPO | Admitting: Podiatry

## 2023-09-16 ENCOUNTER — Other Ambulatory Visit: Payer: Self-pay | Admitting: Allergy and Immunology

## 2023-09-19 ENCOUNTER — Other Ambulatory Visit: Payer: Self-pay | Admitting: Allergy and Immunology

## 2023-09-20 ENCOUNTER — Other Ambulatory Visit: Payer: Self-pay | Admitting: Allergy and Immunology

## 2023-10-06 ENCOUNTER — Encounter: Payer: Self-pay | Admitting: Podiatry

## 2023-10-06 ENCOUNTER — Ambulatory Visit: Payer: PPO | Admitting: Podiatry

## 2023-10-06 DIAGNOSIS — L97511 Non-pressure chronic ulcer of other part of right foot limited to breakdown of skin: Secondary | ICD-10-CM | POA: Diagnosis not present

## 2023-10-06 DIAGNOSIS — M79676 Pain in unspecified toe(s): Secondary | ICD-10-CM

## 2023-10-06 DIAGNOSIS — B351 Tinea unguium: Secondary | ICD-10-CM

## 2023-10-06 DIAGNOSIS — M2041 Other hammer toe(s) (acquired), right foot: Secondary | ICD-10-CM

## 2023-10-06 NOTE — Progress Notes (Signed)
 She presents today chief complaint of a ulcerative lesion to the plantar aspect third digit of the right foot.  She was unable to come in to have the tenotomy performed.  She states that she wears her buttress pads occasionally.  And that the toe bleeds every now and again.  Otherwise she needs her toenails cut.  Objective: Vital signs stable alert oriented x 3.  There is no erythema edema salines drainage odor distal clavus does demonstrate reactive hyperkeratotic tissue superficial ulceration which is present.  Toenails are long thick yellow dystrophic Klay mycotic.  Plan: Debrided ulcerative lesion today schedule her in 2 to 3 weeks for a flexor tenotomy.  Also debrided toenails 1 through 5 bilateral.

## 2023-10-27 ENCOUNTER — Ambulatory Visit: Payer: PPO | Admitting: Podiatry

## 2023-10-28 ENCOUNTER — Encounter (HOSPITAL_COMMUNITY): Payer: Self-pay | Admitting: Student

## 2023-10-28 ENCOUNTER — Ambulatory Visit (HOSPITAL_COMMUNITY): Payer: PPO | Admitting: Student

## 2023-10-28 VITALS — BP 131/84 | Temp 97.7°F | Ht 66.5 in | Wt 289.0 lb

## 2023-10-28 DIAGNOSIS — Z79899 Other long term (current) drug therapy: Secondary | ICD-10-CM

## 2023-10-28 DIAGNOSIS — G2111 Neuroleptic induced parkinsonism: Secondary | ICD-10-CM | POA: Diagnosis not present

## 2023-10-28 DIAGNOSIS — F40240 Claustrophobia: Secondary | ICD-10-CM | POA: Insufficient documentation

## 2023-10-28 DIAGNOSIS — F952 Tourette's disorder: Secondary | ICD-10-CM | POA: Diagnosis not present

## 2023-10-28 DIAGNOSIS — T43505A Adverse effect of unspecified antipsychotics and neuroleptics, initial encounter: Secondary | ICD-10-CM

## 2023-10-28 DIAGNOSIS — F3342 Major depressive disorder, recurrent, in full remission: Secondary | ICD-10-CM

## 2023-10-28 HISTORY — DX: Other long term (current) drug therapy: Z79.899

## 2023-10-28 MED ORDER — ARIPIPRAZOLE 5 MG PO TABS: 5.0000 mg | ORAL_TABLET | Freq: Every day | ORAL | 0 refills | Status: DC

## 2023-10-28 MED ORDER — ARIPIPRAZOLE 20 MG PO TABS: 20.0000 mg | ORAL_TABLET | Freq: Every day | ORAL | 0 refills | Status: DC

## 2023-10-28 MED ORDER — BUPROPION HCL ER (XL) 300 MG PO TB24
300.0000 mg | ORAL_TABLET | Freq: Every day | ORAL | 0 refills | Status: DC
Start: 2023-10-28 — End: 2023-11-23

## 2023-10-28 NOTE — Addendum Note (Signed)
 Addended by: Everlena Cooper on: 10/28/2023 01:11 PM   Modules accepted: Level of Service

## 2023-10-28 NOTE — Progress Notes (Signed)
 BH MD Outpatient Progress Note  10/28/2023 12:23 PM [name]  MRN: [id number]  Assessment:  Penny Porter presents for follow-up evaluation in-person.   Identifying Information: Penny Porter is a 58 y.o. female with a documented history of Tourette's with long-term antipsychotic therapy, MDD/GAD in remission, no suicide attempts or inpatient psych admission, who is an established patient with [facility] for management of mood.   Risk Assessment: An assessment of suicide and violence risk factors was performed as part of this evaluation and is not significantly changed from the last visit.             While future psychiatric events cannot be accurately predicted, the patient does not currently require acute inpatient psychiatric care and does not currently meet [facility] involuntary commitment criteria.      Plan:  # SGA-induced EPS - parkinsonism # Long-term antipsychotic use Past medication trials:  Status of problem: ongoing Per patient, Parkinson's disorder has been r/o (~2022 with DAT).  P/w with pill rolling tremor L > R hand at rest, suspect 2/2 abilify. Will slowly decrease per below and monitor sxs. Also will monitor for mood and other movement/tic-like sxs as we decrease it. If sxs return, will consider cogentin, but would want her off of vistaril first because I don't think it is helping her any at 10 mg.  She also commented on involuntary tongue protrusion when concentrating that worsened ~ 09/2023. Initially thought TD, however AIMS 0, no cogwheeling, no issues swallowing, or other involuntary movements that she has noticed. Will continue to assess, and if it does emerge, will consider ingrezza Interventions: Labs: resulted 09/29/2023 - PCP AIMS: 0 (10/28/2023) EKG: completed 09/16/2022 - due for another EKG coordinating with PCP DECREASED home Abilify 30 mg to 25 mg qAM (d3/19/2025)  # Claustrophobia # MDD in remission # GAD in remission Past  medication trials:  Status of problem: ongoing Previously followed by Dr. Michae Kava until 01/2023 and was on abilify 30 mg and wellbutrin 300 mg. Had been on same meds at same dose for years. No longer meets criteria for MDD or GAD (see my 07/27/2023 note).  Unclear if vistaril is helpful, will attempt to get her off of it slowly, will defer for now given med change per above. However she described avoidance of CPAP, near panic attacks in MRIs, and severe anxiety when her head gets stuck in her shirt, consistent with claustrophobia. Has been steadily getting worst.  Interventions: Therapy: briefly with me in office - imagine in an enclosed space (CPAP on or MRI or shirt over head) Continued home Wellbutrin 300 mg qAM Continued home hydroxyzine 10 mg BID PRN Abilify per above  # H/O Tourettes d/o Past medication trials:  Status of problem: stable Previous motor sxs of left arm jerking/flailing and abdominal tic/jerking and vocal sxs of hiccups that are partially controlled with abilify 30 mg, which she had been on for years. Residual sxs of abdominal tic. I am unsure of the tourettes dx, but will continue to monitor for sxs returning since decreasing abilify per above.  Interventions: Abilify per above  Health Maintenance PCP: Gordan Payment., MD   H/O OSA (no CPAP/jaw) - claustrophobic  Return to care in: Future Appointments  Date Time Provider Department Center  11/05/2023  4:00 PM [place], [place], [place] [facility]  11/23/2023 10:30 AM Princess Bruins, DO [facility] None  12/28/2023 10:00 AM [place]oz[place], Alvira Philips, MD [facility] None   Patient was given contact information for behavioral  health clinic and was instructed to call [phone] for emergencies.    Patient and plan of care will be discussed with the Attending MD, who agrees with the above statement and plan.   Subjective:  Chief Complaint:  Chief Complaint  Patient presents with   Medication Management   Follow-up   Interval  History:   PDMP: lyrica 75 mg x90caps for 30d 10/04/2023  Accompanied with friend who stayed in the waiting room.  She was unaccompanied during evaluation  "Tongue feels longer that it is", started about last month. Denied new rx or events or new foods. AIMS 0, no cogwheeling. Pill rolling will there.  Denied using guard or CPAP for OSA.  Reason is she doesn't tolerate CPAP, would have severe anxiety, near panic attacks. Same when in MRI. She even has severe anxiety if her clothes gets caught on her head briefly.   Mood: "pretty good"  Appetite: "too good", no change  Sleep: "for the most part its ok", feels rested. No issues staying or falling asleep  Discussed medication options for her concerns, however she is content with her current medications, feels that no change are needed.  Patient amenable to plan per above after discussing the risks, benefits, and side effects. Otherwise patient had no other questions or concerns and was amenable to plan per above.  Safety: Denied active and passive SI, HI, AVH.   Review of Systems  Constitutional:  Negative for appetite change.  HENT:  Negative for trouble swallowing.   Respiratory:  Negative for shortness of breath.   Cardiovascular:  Negative for chest pain.  Gastrointestinal:  Positive for constipation (chronic). Negative for abdominal pain.  Neurological:  Negative for dizziness, syncope and light-headedness.   Brief therapy, 10 mins: Psychoeducation on claustrophobia and worked on imagining self in closed space.  Visit Diagnosis:    ICD-10-CM   1. Neuroleptic-induced parkinsonism (HCC)  G21.11 ARIPiprazole (ABILIFY) 20 MG tablet   T43.505A ARIPiprazole (ABILIFY) 5 MG tablet    2. Claustrophobia  F40.240 buPROPion (WELLBUTRIN XL) 300 MG 24 hr tablet    ARIPiprazole (ABILIFY) 20 MG tablet    ARIPiprazole (ABILIFY) 5 MG tablet    3. Tourette's disease  F95.2 ARIPiprazole (ABILIFY) 20 MG tablet    ARIPiprazole (ABILIFY) 5 MG  tablet    4. Long term current use of antipsychotic medication  Z79.899 ARIPiprazole (ABILIFY) 20 MG tablet    ARIPiprazole (ABILIFY) 5 MG tablet    5. MDD (major depressive disorder), recurrent, in full remission (HCC)  F33.42 buPROPion (WELLBUTRIN XL) 300 MG 24 hr tablet    ARIPiprazole (ABILIFY) 20 MG tablet    ARIPiprazole (ABILIFY) 5 MG tablet     Past Psychiatric History:  Diagnoses: MDD, GAD, historical Tourette's disease Medication trials:  Lamictal (allergic rxn - ), risperdal (side effects, leg weakness), brintellix (side effects, GI), clonidine (ineffective),  Clonazepam (PRN 2015-2016), vistaril, cogentin (side effects constipation, urinary hesitancy) Current: Abilify (2015-current), wellbutrin (450 mg caused HTN, 2015-current) Previous psychiatrist/therapist:  Oletta Darter, MD (2015-2024), Vernon Resis (2024-current) Hospitalizations: Denied  Suicide attempts: Denied  SIB: Denied Hx of violence towards others: Denied Current access to guns: Denied Trauma/abuse: Denied  Substance Use History: EtOH:  reports no history of alcohol use. Nicotine:  reports that she has never smoked. She has never used smokeless tobacco. Marijuana: Denied IV drug use: Denied Stimulants: Denied Opiates: Denied Sedative/hypnotics: Denied Hallucinogens: Denied DT: Denied Detox: Denied Residential: Denied   Past Medical History: Dx:  has a past medical  history of Anxiety, Arthritis, Asthma, Bursitis, Complication of anesthesia, Costochondral chest pain, Degenerative disk disease, Depression, Gastroparesis, Generalized anxiety disorder (02/11/2016), GERD (gastroesophageal reflux disease), Hypertension, Long term current use of antipsychotic medication (10/28/2023), Lumbar herniated disc, MDD (major depressive disorder), recurrent, in full remission (HCC) (07/11/2014), Premature atrial beats (12/2019), Sleep apnea, Stenosis of lumbosacral spine, Stress fracture of foot, Thyroid nodule,  Tourette disease, and Trigeminal nerve disease.  Head trauma: Denied Seizures: Denied Allergies: Amoxicillin, Avelox [moxifloxacin hcl in nacl], Adhesive [tape], Bextra [valdecoxib], Doxycycline, Doxycycline hyclate, Gadobutrol, Gadolinium derivatives, Gadoversetamide, Iodinated contrast media, Lamictal [lamotrigine], Naproxen, Oxycodone-acetaminophen, Percolone [oxycodone], Relafen [nabumetone], Risperdal [risperidone], Nitrofurantoin, Brintellix [vortioxetine], Fetzima [levomilnacipran], Moxifloxacin, and Sulfamethoxazole-trimethoprim   Family Psychiatric History:  Denied psychiatric family history Others: sisterwith IDD, dad with dementia  Social History: Current Place of Residence: [place] education [place] of Birth: [place] Family Members: living with 2 sisters Brother deceased - 11-03-2020 Mom deceased - 11-03-16 Dad decreased - 2014 Has adult nibling and younger grand-niblings Marital Status:  Single, never married Children: denies Relationships: denies Education:  Designer, television/film set, CNA Religious Beliefs/Practices: Goes to church, if physically unable, she will watch on TV History of Abuse: none 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 CNA Currently unemployed, disabilities since 11/03/2013 Legal History: denies Hobbies/Interests: reading, coloring, TV  Past Medical History:  Past Medical History:  Diagnosis Date   Anxiety    Arthritis    Back and hip   Asthma    Bursitis    Complication of anesthesia    bladder hard to wake up, never needed a in and out cath   Costochondral chest pain    Degenerative disk disease    Depression    Gastroparesis    Generalized anxiety disorder 02/11/2016   GERD (gastroesophageal reflux disease)    Hypertension    Long term current use of antipsychotic medication 10/28/2023   Lumbar herniated disc    MDD (major depressive disorder), recurrent, in full  remission (HCC) 07/11/2014   Premature atrial beats 12/2019   Sleep apnea    not using CPAP   Stenosis of lumbosacral spine    Stress fracture of foot    Thyroid nodule    Tourette disease    Trigeminal nerve disease     Past Surgical History:  Procedure Laterality Date   BACK SURGERY  09/19/2015   bone spur removal on back     CHOLECYSTECTOMY  11-04-2003   COLONOSCOPY     DILATION AND CURETTAGE OF UTERUS  11/13/2022   ELBOW SURGERY Left 05/29/2014   ESOPHAGOGASTRODUODENOSCOPY     KNEE SURGERY  06/2016   polyp removal     POSTERIOR FUSION LUMBAR SPINE  06/26/2017   ruptured disk  2006-11-04   TEAR DUCT PROBING  04-Nov-2007   Family History:  Family History  Problem Relation Age of Onset   Mental retardation Sister    Dementia Father    Cancer Mother    Cancer Brother        colorectal   Suicidality Neg Hx    Social History   Socioeconomic History   Marital status: Single    Spouse name: Not on file   Number of children: Not on file   Years of education: Not on file   Highest education level: High school graduate  Occupational History    Comment: disabled   Tobacco Use   Smoking  status: Never   Smokeless tobacco: Never  Vaping Use   Vaping status: Never Used  Substance and Sexual Activity   Alcohol use: No   Drug use: No   Sexual activity: Not Currently  Other Topics Concern   Not on file  Social History Narrative   Lives with 2 sisters   Caffeine- none   Social Drivers of Corporate investment banker Strain: Not on file  Food Insecurity: Low Risk  (05/25/2023)   Received from [facility]   Hunger Vital Sign    Worried About Running Out of Food in the Last Year: Never true    Ran Out of Food in the Last Year: Never true  Transportation Needs: No Transportation Needs (05/25/2023)   Received from [facility]    In the past 12 months, has lack of reliable transportation kept you from medical appointments, meetings, work or from getting things needed  for daily living? : No  Physical Activity: Not on file  Stress: Not on file  Social Connections: Not on file    Allergies:  Allergies  Allergen Reactions   Amoxicillin Hives and Swelling    LIP SWELLING PATIENT HAS HAD A PCN REACTION WITH IMMEDIATE RASH, FACIAL/TONGUE/THROAT SWELLING, SOB, OR LIGHTHEADEDNESS WITH HYPOTENSION:  #  #  #  YES  #  #  #   HAS PT DEVELOPED SEVERE RASH INVOLVING MUCUS MEMBRANES or SKIN NECROSIS: #  #  #  YES  #  #  #  Has patient had a PCN reaction that required hospitalization: No Has patient had a PCN reaction occurring within the last 10 years: Unknown If all of the above answers are "NO", then may proceed with Cephalosporin use.    Avelox [Moxifloxacin Hcl In Nacl] Other (See Comments)    Caused pain in shoulders to finger, tendonitis   Adhesive [Tape] Dermatitis    Blisters skin   Bextra [Valdecoxib] Hives and Swelling   Doxycycline Other (See Comments)   Doxycycline Hyclate Hives and Swelling   Gadobutrol Hives   Gadolinium Derivatives Hives   Gadoversetamide Hives   Iodinated Contrast Media Hives   Lamictal [Lamotrigine] Hives   Naproxen Hypertension   Oxycodone-Acetaminophen Other (See Comments)   Percolone [Oxycodone] Hives   Relafen [Nabumetone] Hypertension   Risperdal [Risperidone] Other (See Comments)    Leg weakness    Nitrofurantoin Other (See Comments)   Brintellix [Vortioxetine] Nausea And Vomiting   Fetzima [Levomilnacipran] Other (See Comments)    Tremors across head    Moxifloxacin Other (See Comments)    Caused pain in shoulders to finger, tendonitis   Sulfamethoxazole-Trimethoprim Rash    Flushing and rash    Current Medications: Current Outpatient Medications  Medication Sig Dispense Refill   Acetaminophen (TYLENOL PO) Take 1 tablet by mouth daily as needed (PAIN).     albuterol (PROVENTIL) (2.5 MG/3ML) 0.083% nebulizer solution CAN USE ONE VIAL IN NEBULIZER EVERY FOUR TO SIX HOURS AS NEEDED FOR COUGH OR WHEEZE. 150  mL 1   albuterol (VENTOLIN HFA) 108 (90 Base) MCG/ACT inhaler INHALE 2 PUFFS BY MOUTH EVERY 4 HOURS AS NEEDED FOR WHEEZE OR FOR SHORTNESS OF BREATH 18 each 1   allopurinol (ZYLOPRIM) 100 MG tablet Take by mouth 2 (two) times daily.      aspirin 81 MG tablet Take 81 mg by mouth daily.     azithromycin (ZITHROMAX) 250 MG tablet TAKE 1 TABLET BY MOUTH THREE TIMES A WEEK. 12 tablet 5  Cholecalciferol (VITAMIN D-3) 5000 UNITS TABS Take 5,000 Units by mouth daily.      clotrimazole-betamethasone (LOTRISONE) cream SMARTSIG:sparingly Topical Twice Daily     famotidine (PEPCID) 20 MG tablet TAKE 1 TABLET BY MOUTH TWICE A DAY 180 tablet 0   flecainide (TAMBOCOR) 50 MG tablet SMARTSIG:1 Tablet(s) By Mouth Every 12 Hours     furosemide (LASIX) 20 MG tablet Take 20 mg by mouth daily.     ipratropium (ATROVENT) 0.03 % nasal spray USE 2 SPRAYS IN NOSTRIL (S) 2 TIMES DAILY AS DIRECTED  11   ketoconazole (NIZORAL) 2 % shampoo Apply 1 application topically as needed for irritation.      loratadine (CLARITIN) 10 MG tablet Take 10 mg by mouth at bedtime.      losartan (COZAAR) 50 MG tablet Take 50 mg by mouth daily.     lubiprostone (AMITIZA) 24 MCG capsule Take 24 mcg by mouth daily as needed for constipation.      meclizine (ANTIVERT) 25 MG tablet PLEASE SEE ATTACHED FOR DETAILED DIRECTIONS     meloxicam (MOBIC) 7.5 MG tablet Take 7.5 mg by mouth 2 (two) times daily.  0   methocarbamol (ROBAXIN) 500 MG tablet Take 500 mg by mouth 4 (four) times daily.     metoprolol succinate (TOPROL-XL) 25 MG 24 hr tablet Take by mouth 2 (two) times daily.     Mometasone Furoate (ASMANEX HFA) 200 MCG/ACT AERO Inhale two puffs once daily as directed.  Rinse, gargle, and spit after use. 39 g 1   montelukast (SINGULAIR) 10 MG tablet TAKE 1 TABLET BY MOUTH EVERY DAY 90 tablet 0   mupirocin ointment (BACTROBAN) 2 % Place 1 application into the nose 2 (two) times daily as needed (IRRITATION).      ondansetron (ZOFRAN-ODT) 4 MG  disintegrating tablet Take 4 mg by mouth 2 (two) times daily as needed for nausea or vomiting.     pregabalin (LYRICA) 75 MG capsule Take 75 mg by mouth 2 (two) times daily. Reported on 10/09/2015     Spacer/Aero-Holding Chambers (AEROCHAMBER PLUS) inhaler Use as instructed with inhaler. 1 each 2   spironolactone (ALDACTONE) 25 MG tablet Take 25 mg by mouth daily.     Tiotropium Bromide Monohydrate (SPIRIVA RESPIMAT) 1.25 MCG/ACT AERS Inhale 2 puffs into the lungs daily. 12 g 1   traMADol (ULTRAM) 50 MG tablet Take 50 mg by mouth.     triamcinolone cream (KENALOG) 0.1 % Apply 1 application topically 2 (two) times daily as needed (IRRITATION).      vitamin B-12 (CYANOCOBALAMIN) 1000 MCG tablet Take 1,000 mcg by mouth daily.     ARIPiprazole (ABILIFY) 20 MG tablet Take 1 tablet (20 mg total) by mouth daily. Please take with 1 tablet of abilify 5 mg, for a total dose of 25 mg every day 30 tablet 0   ARIPiprazole (ABILIFY) 5 MG tablet Take 1 tablet (5 mg total) by mouth daily. Please take with 1 tablet of abilify 20 mg, for a total dose of 25 mg every day 30 tablet 0   buPROPion (WELLBUTRIN XL) 300 MG 24 hr tablet Take 1 tablet (300 mg total) by mouth daily. 90 tablet 0   No current facility-administered medications for this visit.    Objective: Psychiatric Specialty Exam: Blood pressure 131/84, temperature 97.7 F (36.5 C), height 5' 6.5" (1.689 m), weight 289 lb (131.1 kg).Body mass index is 45.95 kg/m.  General Appearance: Casual, faily groomed, in wheelchair  Eye Contact:  Good  Speech:  Clear, coherent, normal rate   Volume:  Normal   Mood:  see above  Affect:  Appropriate, congruent, full range  Thought Content: Logical, rumination  Suicidal Thoughts: see above  Thought Process:  Coherent, goal-directed, linear   Orientation:  A&Ox4   Memory:  Immediate good  Judgment:  Good   Insight:  Good   Concentration:  Attention and concentration good   Recall:  Good  Fund of Knowledge:  Good  Language: Good, fluent  Psychomotor Activity: B/l resting tremors in hands, (L>R). None in motion  Akathisia:  see above  AIMS (if indicated): see above  Assets:  Communication Skills Desire for Improvement Financial Resources/Insurance Housing Leisure Time Resilience Social Support Talents/Skills  ADL's:  Intact  Cognition: WNL  Sleep:  see above    Physical Exam Vitals and nursing note reviewed.  Constitutional:      General: She is awake. She is not in acute distress.    Appearance: She is ill-appearing. She is not toxic-appearing or diaphoretic.  HENT:     Head: Normocephalic and atraumatic.  Eyes:     Conjunctiva/sclera: Conjunctivae normal.  Pulmonary:     Effort: Pulmonary effort is normal. No respiratory distress.  Neurological:     General: No focal deficit present.     Mental Status: She is alert and oriented to person, place, and time.     Gait: Gait abnormal.     Metabolic Disorder Labs: No results found for: "HGBA1C", "MPG" Lab Results  Component Value Date   PROLACTIN 13.1 06/09/2017   No results found for: "CHOL", "TRIG", "HDL", "CHOLHDL", "VLDL", "LDLCALC" No results found for: "TSH"  Therapeutic Level Labs: No results found for: "LITHIUM" No results found for: "VALPROATE" No results found for: "CBMZ"  Screenings: PHQ2-9    Flowsheet Row [facility] Office Visit from 09/11/2022 [facility] Office Visit from 03/13/2022 [facility] Office Visit from 11[facility]14/2021 [facility] Video Visit from 07/25/2021 [facility]  PHQ-2 Total Score 1 1 0 1 1      Flowsheet Row [facility] Visit [facility]/29/2023 [facility] Office Visit from [facility]/11[facility]2022 [facility] Office Visit from 03/13/2022 [facility]  C-SSRS RISK CATEGORY No Risk No Risk No Risk      Collaboration of Care: Collaboration of Care: see above  Patient/Guardian was advised Release of Information must be obtained prior to any record release in order to collaborate their care with an outside provider. Patient/Guardian was advised if they have not already done so to contact the registration department to sign all necessary forms in order for [place] to release information regarding their care.   Consent: Patient/Guardian gives verbal consent for treatment and assignment of benefits for services provided during this visit. Patient/Guardian expressed understanding and agreed to proceed.   60 minutes were spent in chart review, interview, psycho education, counseling, medical decision making, coordination of care and long-term prognosis.  Patient was given opportunity to ask question and all concerns and questions were addressed and answers. Excluding separately billable services.  Princess Bruins, DO Psych Resident, PGY-3

## 2023-11-05 ENCOUNTER — Other Ambulatory Visit: Payer: Self-pay | Admitting: Podiatry

## 2023-11-05 ENCOUNTER — Encounter: Payer: Self-pay | Admitting: Podiatry

## 2023-11-05 ENCOUNTER — Ambulatory Visit (INDEPENDENT_AMBULATORY_CARE_PROVIDER_SITE_OTHER)

## 2023-11-05 ENCOUNTER — Ambulatory Visit: Admitting: Podiatry

## 2023-11-05 DIAGNOSIS — L97511 Non-pressure chronic ulcer of other part of right foot limited to breakdown of skin: Secondary | ICD-10-CM | POA: Diagnosis not present

## 2023-11-05 DIAGNOSIS — M2041 Other hammer toe(s) (acquired), right foot: Secondary | ICD-10-CM

## 2023-11-05 DIAGNOSIS — M205X1 Other deformities of toe(s) (acquired), right foot: Secondary | ICD-10-CM | POA: Diagnosis not present

## 2023-11-05 DIAGNOSIS — M2042 Other hammer toe(s) (acquired), left foot: Secondary | ICD-10-CM

## 2023-11-05 DIAGNOSIS — M205X9 Other deformities of toe(s) (acquired), unspecified foot: Secondary | ICD-10-CM

## 2023-11-05 NOTE — Patient Instructions (Signed)
 Leave bandage in place and dry for 4 days, then remove. You may wash foot normally after removal of bandage. DO NOT SOAK FOOT! Dry completely afterwards and may use a bandaid over incision if needed. We will follow up with you in 1 weeks for recheck.

## 2023-11-08 NOTE — Progress Notes (Signed)
 She presents today with her friend for follow-up of her painful toes third digit right foot.  She states that she thinks the wound is healed as she refers to the callus and ulcerative lesion to the distal aspect of the third toe right foot.  We discussed etiology pathology conservative surgical therapies.  She has osteoarthritis with flexor contraction at the PIPJ third digit right foot with some DIPJ flexor contraction as well.  There is some range of motion that can be obtained at the PIPJ and DIPJ which if the flexor tendon was transected would probably allow this wound to heal completely.  Radiographs taken today demonstrate osseously mature individual bone demineralization is present.  She has flexor contraction of the toes no signs of osteoarthritis of the toe itself.  The hammertoe deformity is present.  Assessment: Flexor contracture third digit with ulcer right.  Plan: At this point we consented her today for a flexor tenotomy.  Third digit right foot was injected with 2-1/2 cc of 50-50 mixture of Marcaine plain and lidocaine with epinephrine.  Once the toe was numb the area was prepped and draped in normal sterile fashion utilizing an 18-gauge needle a small stab incision was made to the plantar surface of the middle phalanx.  I was able to transect the long flexor tendon which allowed the toe to straighten to some degree.  I flushed the area and then applied Dermabond and a dry sterile compressive dressing.  She will stay in a Darco shoe for the next couple of weeks follow-up with her then.

## 2023-11-09 ENCOUNTER — Telehealth: Payer: Self-pay

## 2023-11-09 NOTE — Telephone Encounter (Signed)
 Penny Porter called and has a question about her foot. She requested a call from you.

## 2023-11-10 ENCOUNTER — Encounter: Payer: Self-pay | Admitting: Podiatry

## 2023-11-10 ENCOUNTER — Ambulatory Visit: Admitting: Podiatry

## 2023-11-10 DIAGNOSIS — Z9889 Other specified postprocedural states: Secondary | ICD-10-CM

## 2023-11-10 DIAGNOSIS — M205X9 Other deformities of toe(s) (acquired), unspecified foot: Secondary | ICD-10-CM

## 2023-11-10 NOTE — Progress Notes (Signed)
 Penny Porter presents today for follow-up of her flexor tenotomy third digit right foot.  She states that is doing fine no problems.  Objective: Vital signs stable oriented x 3 the toe is sitting much more rectus.  Distal clavus present today I did debride and redressed the toe today and encouraged her to leave this on for 2 to 3 days and then washed thoroughly.  She may get back into her regular shoe gear at that time like to follow-up with her in about 2 more weeks

## 2023-11-12 ENCOUNTER — Ambulatory Visit: Admitting: Podiatry

## 2023-11-20 ENCOUNTER — Other Ambulatory Visit (HOSPITAL_COMMUNITY): Payer: Self-pay | Admitting: Student

## 2023-11-20 DIAGNOSIS — G2111 Neuroleptic induced parkinsonism: Secondary | ICD-10-CM

## 2023-11-20 DIAGNOSIS — F40240 Claustrophobia: Secondary | ICD-10-CM

## 2023-11-20 DIAGNOSIS — F952 Tourette's disorder: Secondary | ICD-10-CM

## 2023-11-20 DIAGNOSIS — F3342 Major depressive disorder, recurrent, in full remission: Secondary | ICD-10-CM

## 2023-11-20 DIAGNOSIS — Z79899 Other long term (current) drug therapy: Secondary | ICD-10-CM

## 2023-11-23 ENCOUNTER — Ambulatory Visit (HOSPITAL_COMMUNITY): Admitting: Student

## 2023-11-23 ENCOUNTER — Encounter (HOSPITAL_COMMUNITY): Payer: Self-pay | Admitting: Student

## 2023-11-23 DIAGNOSIS — F3342 Major depressive disorder, recurrent, in full remission: Secondary | ICD-10-CM

## 2023-11-23 DIAGNOSIS — Z79899 Other long term (current) drug therapy: Secondary | ICD-10-CM

## 2023-11-23 DIAGNOSIS — F40240 Claustrophobia: Secondary | ICD-10-CM

## 2023-11-23 DIAGNOSIS — T43505A Adverse effect of unspecified antipsychotics and neuroleptics, initial encounter: Secondary | ICD-10-CM | POA: Diagnosis not present

## 2023-11-23 DIAGNOSIS — G2111 Neuroleptic induced parkinsonism: Secondary | ICD-10-CM

## 2023-11-23 DIAGNOSIS — F952 Tourette's disorder: Secondary | ICD-10-CM | POA: Diagnosis not present

## 2023-11-23 MED ORDER — BUPROPION HCL ER (XL) 300 MG PO TB24: 300.0000 mg | ORAL_TABLET | Freq: Every day | ORAL | 0 refills | Status: DC

## 2023-11-23 MED ORDER — ARIPIPRAZOLE 20 MG PO TABS: 20.0000 mg | ORAL_TABLET | Freq: Every day | ORAL | 0 refills | Status: DC

## 2023-11-23 NOTE — Addendum Note (Signed)
 Addended by: Donnelly Gainer on: 11/23/2023 12:09 PM   Modules accepted: Level of Service

## 2023-11-23 NOTE — Progress Notes (Signed)
 BH MD Outpatient Progress Note  11/23/2023 11:43 AM Penny Porter  MRN: [id number]  Assessment:  Penny Porter presents for follow-up evaluation in-person.   Identifying Information: Penny Porter is a 58 y.o. female with a documented history of Tourette's with long-term antipsychotic therapy, MDD/GAD in remission, no suicide attempts or inpatient psych admission, who is an established patient with [facility] for management of mood.   Risk Assessment: An assessment of suicide and violence risk factors was performed as part of this evaluation and is not significantly changed from the last visit.             While future psychiatric events cannot be accurately predicted, the patient does not currently require acute inpatient psychiatric care and does not currently meet [place]  involuntary commitment criteria.      Plan:  # SGA-induced EPS - parkinsonism # Long-term antipsychotic use Past medication trials:  Status of problem: improving Per patient, Parkinson's disorder has been r/o (~2022 with DAT).  P/w with pill rolling tremor L > R hand at rest, suspect 2/2 abilify, evident by improvement in tremor when decreased abilify. Will slowly decrease per below and monitor sxs. Also will monitor for mood and other movement/tic-like sxs as we decrease it. If sxs return, will consider cogentin, but would want her off of vistaril first because I don't think it is helping her any at 10 mg. Did not mention issues with tongue protrusions.  Interventions: Labs: resulted 09/29/2023 - PCP AIMS: 0 (10/28/2023) EKG: completed 09/16/2022 - due for another EKG coordinating with PCP - 12/2023 DECREASED home Abilify 25 mg to 20 mg qAM (d4/14/2025)  # Claustrophobia # MDD in remission # GAD in remission Past medication trials:  Status of problem: ongoing Previously followed by Dr. Katrine Parody until 01/2023 and was on abilify 30 mg and wellbutrin 300 mg. Had been on same meds at same dose for  years. No longer meets criteria for MDD or GAD (see my 07/27/2023 note).  Unclear if vistaril is helpful, will attempt to get her off of it slowly, will defer for now given med change per above. However she described avoidance of CPAP, near panic attacks in MRIs, and severe anxiety when her head gets stuck in her shirt, consistent with claustrophobia.  Interventions: Therapy: briefly with me in office - imagine in an enclosed space (CPAP on or MRI or shirt over head) Continued home Wellbutrin 300 mg qAM Continued home hydroxyzine 10 mg BID PRN Abilify per above  # H/O Tourettes d/o Past medication trials:  Status of problem: stable Previous motor sxs of left arm jerking/flailing and abdominal tic/jerking and vocal sxs of hiccups that are partially controlled with abilify 30 mg, which she had been on for years. Residual sxs of abdominal tic, which has been unchanged since decreasing abilify per above. I am unsure of the tourettes dx, but will continue to monitor for sxs returning since decreasing abilify per above.  Interventions: Abilify per above  Health Maintenance PCP: Abbe Hoard., MD   H/O OSA (no CPAP/jaw) - claustrophobic  Return to care in: Future Appointments  Date Time Provider Department Center  12/01/2023  1:15 PM [place], [place] [facility]  12/28/2023 10:00 AM Jerelene Monday, Rema Care, MD [facility]  01/11/2024 10:30 AM Arabel Barcenas, DO [facility] None   Patient was given contact information for behavioral health clinic and was instructed to call 911 for emergencies.    Patient and plan of care will be discussed with the  Attending MD, who agrees with the above statement and plan.   Subjective:  Chief Complaint:  Chief Complaint  Patient presents with   Medication Management   Medication Reaction    EPS   Interval History:   PDMP: lyrica 75 mg x90caps for 30d 11/02/2023  Accompanied with friend who stayed in the waiting room.  She was unaccompanied  during evaluation  Mood: "pretty good", no change. Feeling happy overall. Denied feeling down or stressed or worried since decreasing the abilify.  Has noticed improvement in pill rolling tremor in hand. She's ok with further decreasing of abilify.  Still anxious when thinking about being in closed spaces. Talked about visualizing herself in a smaller space and work on deep breathing.   Appetite: "too good", no change  Sleep: "for the most part its ok", feels rested. No issues staying or falling asleep. Not on CPAP due to claustrophobia.   Her PCP appointment is in May, to get her EKG.  Patient amenable to plan per above after discussing the risks, benefits, and side effects. Otherwise patient had no other questions or concerns and was amenable to plan per above.  Safety: Denied active and passive SI, HI, AVH.   Review of Systems  Constitutional:  Negative for appetite change.  HENT:  Negative for trouble swallowing.   Respiratory:  Negative for shortness of breath.   Cardiovascular:  Negative for chest pain.  Gastrointestinal:  Positive for constipation (chronic). Negative for abdominal pain.  Neurological:  Negative for dizziness, syncope and light-headedness.   Visit Diagnosis:    ICD-10-CM   1. Neuroleptic-induced parkinsonism (HCC)  G21.11 ARIPiprazole (ABILIFY) 20 MG tablet   T43.505A     2. Claustrophobia  F40.240 buPROPion (WELLBUTRIN XL) 300 MG 24 hr tablet    ARIPiprazole (ABILIFY) 20 MG tablet    3. Tourette's disease  F95.2 ARIPiprazole (ABILIFY) 20 MG tablet    4. MDD (major depressive disorder), recurrent, in full remission (HCC)  F33.42 buPROPion (WELLBUTRIN XL) 300 MG 24 hr tablet    ARIPiprazole (ABILIFY) 20 MG tablet    5. Long term current use of antipsychotic medication  Z79.899 ARIPiprazole (ABILIFY) 20 MG tablet     Past Psychiatric History:  Diagnoses: MDD, GAD, historical Tourette's disease, Neuroleptic-induced parkinsonism (abilify) Medication  trials:  Lamictal (allergic rxn - ), risperdal (side effects, leg weakness), brintellix (side effects, GI), clonidine (ineffective),  Clonazepam (PRN 2015-2016), vistaril, cogentin (side effects constipation, urinary hesitancy) Current: Abilify (2015-current, side effects of parkinsonism at >=25 mg), wellbutrin (450 mg caused HTN, 2015-current) Previous psychiatrist/therapist:  Oletta Darter, MD (2015-2024),  [facility] (2024-current) Hospitalizations: Denied  Suicide attempts: Denied  SIB: Denied Hx of violence towards others: Denied Current access to guns: Denied Trauma/abuse: Denied  Substance Use History: EtOH:  reports no history of alcohol use. Nicotine:  reports that she has never smoked. She has never used smokeless tobacco. Marijuana: Denied IV drug use: Denied Stimulants: Denied Opiates: Denied Sedative/hypnotics: Denied Hallucinogens: Denied DT: Denied Detox: Denied Residential: Denied   Past Medical History: Dx:  has a past medical history of Anxiety, Arthritis, Asthma, Bursitis, Complication of anesthesia, Costochondral chest pain, Degenerative disk disease, Depression, Gastroparesis, Generalized anxiety disorder (02/11/2016), GERD (gastroesophageal reflux disease), Hypertension, Long term current use of antipsychotic medication (10/28/2023), Lumbar herniated disc, MDD (major depressive disorder), recurrent, in full remission (HCC) (07/11/2014), Premature atrial beats (12/2019), Sleep apnea, Stenosis of lumbosacral spine, Stress fracture of foot, Thyroid nodule, Tourette disease, and Trigeminal nerve disease.  Head trauma: Denied Seizures:  Denied Allergies: Amoxicillin, Avelox [moxifloxacin hcl in nacl], Adhesive [tape], Bextra [valdecoxib], Doxycycline, Doxycycline hyclate, Gadobutrol, Gadolinium derivatives, Gadoversetamide, Iodinated contrast media, Lamictal [lamotrigine], Naproxen, Oxycodone-acetaminophen, Percolone [oxycodone], Relafen [nabumetone], Risperdal  [risperidone], Nitrofurantoin, Brintellix [vortioxetine], Fetzima [levomilnacipran], Moxifloxacin, and Sulfamethoxazole-trimethoprim   Family Psychiatric History:  Denied psychiatric family history Others: sisterwith IDD, dad with dementia  Social History: Current Place of Residence: [place] of Birth: [place] Family Members: living with 2 sisters Brother deceased - Dec 15, 2020 Mom deceased - 12-15-2016 Dad decreased - 2014 Has adult nibling and younger grand-niblings Marital Status:  Single, never married Children: denies Relationships: denies Education:  Designer, television/film set, CNA Religious Beliefs/Practices: Goes to church, if physically unable, she will watch on TV History of Abuse: none 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 CNA Currently unemployed, disabilities since 2013-12-15 Legal History: denies Hobbies/Interests: reading, coloring, TV  Past Medical History:  Past Medical History:  Diagnosis Date   Anxiety    Arthritis    Back and hip   Asthma    Bursitis    Complication of anesthesia    bladder hard to wake up, never needed a in and out cath   Costochondral chest pain    Degenerative disk disease    Depression    Gastroparesis    Generalized anxiety disorder 02/11/2016   GERD (gastroesophageal reflux disease)    Hypertension    Long term current use of antipsychotic medication 10/28/2023   Lumbar herniated disc    MDD (major depressive disorder), recurrent, in full remission (HCC) 07/11/2014   Premature atrial beats 12/2019   Sleep apnea    not using CPAP   Stenosis of lumbosacral spine    Stress fracture of foot    Thyroid nodule    Tourette disease    Trigeminal nerve disease     Past Surgical History:  Procedure Laterality Date   BACK SURGERY  09/19/2015   bone spur removal on back     CHOLECYSTECTOMY  2003-12-16   COLONOSCOPY     DILATION AND CURETTAGE OF  UTERUS  11/13/2022   ELBOW SURGERY Left 05/29/2014   ESOPHAGOGASTRODUODENOSCOPY     KNEE SURGERY  06/2016   polyp removal     POSTERIOR FUSION LUMBAR SPINE  06/26/2017   ruptured disk  2006/12/16   TEAR DUCT PROBING  12-16-2007   Family History:  Family History  Problem Relation Age of Onset   Mental retardation Sister    Dementia Father    Cancer Mother    Cancer Brother        colorectal   Suicidality Neg Hx    Social History   Socioeconomic History   Marital status: Single    Spouse name: Not on file   Number of children: Not on file   Years of education: Not on file   Highest education level: High school graduate  Occupational History    Comment: disabled   Tobacco Use   Smoking status: Never   Smokeless tobacco: Never  Vaping Use   Vaping status: Never Used  Substance and Sexual Activity   Alcohol use: No   Drug use: No   Sexual activity: Not Currently  Other Topics Concern   Not on file  Social History Narrative   Lives with 2 sisters   Caffeine- none   Social Drivers of Corporate investment banker Strain: Not on file  Food Insecurity: Low Risk  (05/25/2023)   Received from [facility]   Hunger Vital Sign    Worried About Running Out of Food in the Last Year: Never true    Ran Out of Food in the Last Year: Never true  Transportation Needs: No Transportation Needs (05/25/2023)   Received from [facility]    In the past 12 months, has lack of reliable transportation kept you from medical appointments, meetings, work or from getting things needed for daily living? : No  Physical Activity: Not on file  Stress: Not on file  Social Connections: Not on file   Allergies:  Allergies  Allergen Reactions   Amoxicillin Hives and Swelling    LIP SWELLING PATIENT HAS HAD A PCN REACTION WITH IMMEDIATE RASH, FACIAL/TONGUE/THROAT SWELLING, SOB, OR LIGHTHEADEDNESS WITH HYPOTENSION:  #  #  #  YES  #  #  #   HAS PT DEVELOPED SEVERE RASH INVOLVING MUCUS  MEMBRANES or SKIN NECROSIS: #  #  #  YES  #  #  #  Has patient had a PCN reaction that required hospitalization: No Has patient had a PCN reaction occurring within the last 10 years: Unknown If all of the above answers are "NO", then may proceed with Cephalosporin use.    Avelox [Moxifloxacin Hcl In Nacl] Other (See Comments)    Caused pain in shoulders to finger, tendonitis   Adhesive [Tape] Dermatitis    Blisters skin   Bextra [Valdecoxib] Hives and Swelling   Doxycycline Other (See Comments)   Doxycycline Hyclate Hives and Swelling   Gadobutrol Hives   Gadolinium Derivatives Hives   Gadoversetamide Hives   Iodinated Contrast Media Hives   Lamictal [Lamotrigine] Hives   Naproxen Hypertension   Oxycodone-Acetaminophen Other (See Comments)   Percolone [Oxycodone] Hives   Relafen [Nabumetone] Hypertension   Risperdal [Risperidone] Other (See Comments)    Leg weakness    Nitrofurantoin Other (See Comments)   Brintellix [Vortioxetine] Nausea And Vomiting   Fetzima [Levomilnacipran] Other (See Comments)    Tremors across head    Moxifloxacin Other (See Comments)    Caused pain in shoulders to finger, tendonitis   Sulfamethoxazole-Trimethoprim Rash    Flushing and rash   Current Medications: Current Outpatient Medications  Medication Sig Dispense Refill   Acetaminophen (TYLENOL PO) Take 1 tablet by mouth daily as needed (PAIN).     albuterol (PROVENTIL) (2.5 MG/3ML) 0.083% nebulizer solution CAN USE ONE VIAL IN NEBULIZER EVERY FOUR TO SIX HOURS AS NEEDED FOR COUGH OR WHEEZE. 150 mL 1   albuterol (VENTOLIN HFA) 108 (90 Base) MCG/ACT inhaler INHALE 2 PUFFS BY MOUTH EVERY 4 HOURS AS NEEDED FOR WHEEZE OR FOR SHORTNESS OF BREATH 18 each 1   allopurinol (ZYLOPRIM) 100 MG tablet Take by mouth 2 (two) times daily.      ARIPiprazole (ABILIFY) 20 MG tablet Take 1 tablet (20 mg total) by mouth daily. Please take with 1 tablet of abilify 5 mg, for a total dose of 25 mg every day 60 tablet 0    aspirin 81 MG tablet Take 81 mg by mouth daily.     azithromycin (ZITHROMAX) 250 MG tablet TAKE 1 TABLET BY MOUTH THREE TIMES A WEEK. 12 tablet 5   buPROPion (WELLBUTRIN XL) 300 MG 24 hr tablet Take 1 tablet (300 mg total) by mouth daily. 60 tablet 0   Cholecalciferol (VITAMIN D-3) 5000 UNITS TABS Take 5,000 Units by mouth daily.      clotrimazole-betamethasone (  LOTRISONE) cream SMARTSIG:sparingly Topical Twice Daily     famotidine (PEPCID) 20 MG tablet TAKE 1 TABLET BY MOUTH TWICE A DAY 180 tablet 0   flecainide (TAMBOCOR) 50 MG tablet SMARTSIG:1 Tablet(s) By Mouth Every 12 Hours     furosemide (LASIX) 20 MG tablet Take 20 mg by mouth daily.     ipratropium (ATROVENT) 0.03 % nasal spray USE 2 SPRAYS IN NOSTRIL (S) 2 TIMES DAILY AS DIRECTED  11   ketoconazole (NIZORAL) 2 % shampoo Apply 1 application topically as needed for irritation.      loratadine (CLARITIN) 10 MG tablet Take 10 mg by mouth at bedtime.      losartan (COZAAR) 50 MG tablet Take 50 mg by mouth daily.     lubiprostone (AMITIZA) 24 MCG capsule Take 24 mcg by mouth daily as needed for constipation.      meclizine (ANTIVERT) 25 MG tablet PLEASE SEE ATTACHED FOR DETAILED DIRECTIONS     meloxicam (MOBIC) 7.5 MG tablet Take 7.5 mg by mouth 2 (two) times daily.  0   methocarbamol (ROBAXIN) 500 MG tablet Take 500 mg by mouth 4 (four) times daily.     metoprolol succinate (TOPROL-XL) 25 MG 24 hr tablet Take by mouth 2 (two) times daily.     Mometasone Furoate (ASMANEX HFA) 200 MCG/ACT AERO Inhale two puffs once daily as directed.  Rinse, gargle, and spit after use. 39 g 1   montelukast (SINGULAIR) 10 MG tablet TAKE 1 TABLET BY MOUTH EVERY DAY 90 tablet 0   mupirocin ointment (BACTROBAN) 2 % Place 1 application into the nose 2 (two) times daily as needed (IRRITATION).      ondansetron (ZOFRAN-ODT) 4 MG disintegrating tablet Take 4 mg by mouth 2 (two) times daily as needed for nausea or vomiting.     pregabalin (LYRICA) 75 MG capsule  Take 75 mg by mouth 2 (two) times daily. Reported on 10/09/2015     Spacer/Aero-Holding Chambers (AEROCHAMBER PLUS) inhaler Use as instructed with inhaler. 1 each 2   spironolactone (ALDACTONE) 25 MG tablet Take 25 mg by mouth daily.     Tiotropium Bromide Monohydrate (SPIRIVA RESPIMAT) 1.25 MCG/ACT AERS Inhale 2 puffs into the lungs daily. 12 g 1   traMADol (ULTRAM) 50 MG tablet Take 50 mg by mouth.     triamcinolone cream (KENALOG) 0.1 % Apply 1 application topically 2 (two) times daily as needed (IRRITATION).      vitamin B-12 (CYANOCOBALAMIN) 1000 MCG tablet Take 1,000 mcg by mouth daily.     No current facility-administered medications for this visit.   Objective: Psychiatric Specialty Exam: There were no vitals taken for this visit.There is no height or weight on file to calculate BMI.  General Appearance: Casual, fairly groomed, in wheelchair  Eye Contact:  Good    Speech:  Clear, coherent, normal rate   Volume:  Normal   Mood:  see above  Affect:  Appropriate, congruent, full range  Thought Content: Logical, rumination  Suicidal Thoughts: see above  Thought Process:  Coherent, goal-directed, linear   Orientation:  A&Ox4   Memory:  Immediate good  Judgment:  Good   Insight:  Good   Concentration:  Attention and concentration good   Recall:  Good  Fund of Knowledge: Good  Language: Good, fluent  Psychomotor Activity: B/l resting tremors in hands, (L>R), still present. None in motion  Akathisia:  see above  AIMS (if indicated): see above  Assets:  Communication Skills Desire for Improvement Financial Resources/Insurance Housing  Leisure Time Resilience Social Support Talents/Skills  ADL's:  Intact mostly, doesn't ambulate in office but can with cane at home.  Cognition: WNL  Sleep:  see above    Physical Exam Vitals and nursing note reviewed.  Constitutional:      General: She is awake. She is not in acute distress.    Appearance: She is not toxic-appearing or  diaphoretic.  HENT:     Head: Normocephalic and atraumatic.  Eyes:     Conjunctiva/sclera: Conjunctivae normal.  Pulmonary:     Effort: Pulmonary effort is normal. No respiratory distress.  Neurological:     General: No focal deficit present.     Mental Status: She is alert and oriented to person, place, and time.     Gait: Gait abnormal.     Metabolic Disorder Labs: No results found for: "HGBA1C", "MPG" Lab Results  Component Value Date   PROLACTIN 13.1 06/09/2017   No results found for: "CHOL", "TRIG", "HDL", "CHOLHDL", "VLDL", "LDLCALC" No results found for: "TSH"  Therapeutic Level Labs: No results found for: "LITHIUM" No results found for: "VALPROATE" No results found for: "CBMZ"  Screenings: PHQ2-9    Flowsheet Row [facility] Office Visit from 09/11/2022 [facility] Office Visit from 03/13/2022 [facility] Office Visit from 11[facility]14/2021 [facility] Video Visit from 07/25/2021 [facility]  PHQ-2 Total Score 1 1 0 1 1      Flowsheet Row [facility] Visit [facility]/29/2023 [facility] Office Visit from 09/11/2022 in [facility] Office Visit from 03/13/2022 in [facility]  C-SSRS RISK CATEGORY No Risk No Risk No Risk      Collaboration of Care: Collaboration of Care: see above  Patient/Guardian was advised Release of Information must be obtained prior to any record release in order to collaborate their care with an outside provider. Patient/Guardian was advised if they have not already done so to contact the registration department to sign all necessary forms in order for us  to release information regarding their care.   Consent:  Patient/Guardian gives verbal consent for treatment and assignment of benefits for services provided during this visit. Patient/Guardian expressed understanding and agreed to proceed.   60 minutes were spent in chart review, interview, psycho education, counseling, medical decision making, coordination of care and long-term prognosis.  Patient was given opportunity to ask question and all concerns and questions were addressed and answers. Excluding separately billable services.  Tatia Petrucci, DO Psych Resident, PGY-3

## 2023-11-24 ENCOUNTER — Other Ambulatory Visit: Payer: Self-pay | Admitting: Allergy and Immunology

## 2023-12-01 ENCOUNTER — Ambulatory Visit: Admitting: Podiatry

## 2023-12-01 DIAGNOSIS — M205X9 Other deformities of toe(s) (acquired), unspecified foot: Secondary | ICD-10-CM

## 2023-12-02 NOTE — Progress Notes (Signed)
 She presents today for follow-up of her flexor tenotomy third digit right foot.  She is having no problem with it whatsoever she has painful elongated nails.  Objective: Toenails are long thick yellow dystrophic clinically mycotic.  Surgical site is gone on to heal uneventfully.  There is no further ulceration of the toe.  Assessment: Well-healing surgical toe painful mycotic nails.  Debrided toenails 1 through 5 bilateral follow-up with her in 3 months

## 2023-12-20 ENCOUNTER — Other Ambulatory Visit: Payer: Self-pay | Admitting: Allergy and Immunology

## 2023-12-23 ENCOUNTER — Other Ambulatory Visit: Payer: Self-pay | Admitting: Allergy and Immunology

## 2023-12-28 ENCOUNTER — Ambulatory Visit: Payer: PPO | Admitting: Allergy and Immunology

## 2024-01-11 ENCOUNTER — Ambulatory Visit (HOSPITAL_COMMUNITY): Admitting: Student

## 2024-01-11 ENCOUNTER — Encounter (HOSPITAL_COMMUNITY): Payer: Self-pay | Admitting: Student

## 2024-01-11 DIAGNOSIS — F3342 Major depressive disorder, recurrent, in full remission: Secondary | ICD-10-CM

## 2024-01-11 DIAGNOSIS — F40240 Claustrophobia: Secondary | ICD-10-CM

## 2024-01-11 DIAGNOSIS — F952 Tourette's disorder: Secondary | ICD-10-CM

## 2024-01-11 DIAGNOSIS — T43505A Adverse effect of unspecified antipsychotics and neuroleptics, initial encounter: Secondary | ICD-10-CM

## 2024-01-11 DIAGNOSIS — Z79899 Other long term (current) drug therapy: Secondary | ICD-10-CM

## 2024-01-11 DIAGNOSIS — G2111 Neuroleptic induced parkinsonism: Secondary | ICD-10-CM

## 2024-01-11 MED ORDER — ARIPIPRAZOLE 15 MG PO TABS: 15.0000 mg | ORAL_TABLET | Freq: Every day | ORAL | 0 refills | Status: DC

## 2024-01-11 MED ORDER — BUPROPION HCL ER (XL) 300 MG PO TB24
300.0000 mg | ORAL_TABLET | Freq: Every day | ORAL | 0 refills | Status: DC
Start: 2024-01-11 — End: 2024-03-07

## 2024-01-11 MED ORDER — HYDROXYZINE HCL 10 MG PO TABS: 10.0000 mg | ORAL_TABLET | Freq: Every day | ORAL | 0 refills | Status: DC | PRN

## 2024-01-11 NOTE — Progress Notes (Signed)
 BH MD Outpatient Progress Note  01/11/2024 1:49 PM Penny Porter  MRN: [id number]  Assessment:  Penny Porter presents for follow-up evaluation in-person.   Identifying Information: Penny Porter is a 58 y.o. female with a documented history of Tourette's with long-term antipsychotic therapy, MDD/GAD in remission, no suicide attempts or inpatient psych admission, who is an established patient with [facility] for management of mood.   Risk Assessment: An assessment of suicide and violence risk factors was performed as part of this evaluation and is not significantly changed from the last visit.             While future psychiatric events cannot be accurately predicted, the patient does not currently require acute inpatient psychiatric care and does not currently meet [place]  involuntary commitment criteria.      Plan:  # SGA-induced EPS - parkinsonism # Long-term antipsychotic use Past medication trials: cogentin , vistaril  Status of problem: improving Per patient, Parkinson's disorder has been r/o (~2022 with DAT).  P/w with ongoing pill rolling tremor L hand at rest, suspect 2/2 abilify , evident by improvement in tremor when decreased abilify . Will slowly decrease per below and monitor sxs. Still monitoring for mood and other movement/tic-like sxs as we decrease it, none thus far. Abdominal jerk still the same. About 3 weeks prior when she was sick with norovirus, stopped her psych rx due to feeling sick and diarrhea for >1 week, didn't notice her tremors, but could have been due to her being occupied with being sick, however did note the worsening of tremors when she restarted back the abilify , further confirming abilify  causing her EPS.  This is the first time I've seen her walk, requires cane, because she is usually in a wheelchair, but noted bradykinesia in standing up from sitting, slow to turn around, stopped posture when walking, in addition to pill roll tremor. No  shuffling gait, able to pick up her feet when ambulating. Suspect these to be additional parkinsonian sxs from SGA.  Interventions: Labs: resulted 09/29/2023 - PCP AIMS: 0 (01/11/2024) EKG: 01/05/2024 HR 68, Qt/Qtc 412/438, NSR from PCP (brought in physical copy) Monitor for return of mood sxs Monitor for return of tics Monitor EPS sxs DECREASED home Abilify  20 mg to 15 mg qAM (d6/09/2023)   # Claustrophobia # MDD in remission # GAD in remission Past medication trials: lamictal, risperdal, brintelliz, clonidine, klonopin , vistaril , cogentin  Status of problem: ongoing Previously followed by Dr. Katrine Parody until 01/2023 and was on abilify  30 mg and wellbutrin  300 mg. Had been on same meds at same dose for years. No longer meets criteria for MDD or GAD (see my 07/27/2023 note). Unclear if vistaril  is helpful, she decreased from bid to daily dosing and hasn't noticed a difference, could likely come off of it later, but holding at this time due to med change per above.  However she described avoidance of CPAP, near panic attacks in MRIs, and severe anxiety when her head gets stuck in her shirt, consistent with claustrophobia. Currently on wellbutrin , which she reported has been helpful, but I did talk to her about ssri for the anxiety.  Interventions: Therapy: declined Continued home Wellbutrin  300 mg qAM CHANGED home hydroxyzine  10 mg BID PRN to daily PRN to reflect what she is taking at home currently Abilify  per above  # H/O Tourettes d/o Past medication trials:  Status of problem: stable See [facility] neurology clinic note from 10/05/2013 for detailed eval. On my initial eval, reported previous motor sxs of left  arm jerking/flailing and abdominal tic/jerking and vocal sxs of hiccups that are partially controlled with abilify  30 mg, which she had been on for years. Residual sxs of abdominal tic, which has been unchanged since decreasing abilify  per above. Will continue to monitor for sxs returning since  decreasing abilify  per above, however may not be able to get her off of it completely due to abilify  working. Will consider slowing down the abilify  taper to every other visit to ensure full effect of dose changes and to monitor sxs. Interventions: Abilify  per above  Health Maintenance PCP: Abbe Hoard., MD   H/O OSA (no CPAP/jaw) - claustrophobic  Return to care in: Future Appointments  Date Time Provider Department Center  01/28/2024 10:40 AM Kozlow, Rema Care, MD [facility] None  03/01/2024 10:45 AM Clemetine Cypher, DPM [facility]   Patient was given contact information for behavioral health clinic and was instructed to call [phone] for emergencies.    Patient and plan of care will be discussed with the Attending MD, who agrees with the above statement and plan.   Subjective:  Chief Complaint:  Chief Complaint  Patient presents with   Medication Management   Follow-up   Medication Problem   Medication Refill   Interval History:   PDMP: lyrica  75 mg x90caps for 30d 12/04/2023  Continued home Wellbutrin  300 mg qAM - adherent Continued home hydroxyzine  10 mg BID PRN - changed to daily  DECREASED home Abilify  25 mg to 20 mg qAM (d4/14/2025) - adherent Brought in ekg from pcp, see above  Accompanied with friend who stayed in the waiting room.  She was unaccompanied during evaluation.  Was recently sick with norovirus for over a week with diarrhea and nausea about 3 weeks ago. During that time, she did not take her psych meds. Viral illness self-resolved. She was too busy feeling sick to notice change in tremor, tics, gait. And she was mainly resting in bed when she could. However when she restarted back the abilify  when she felt better, did notice pill rolling tremor returning and worsen initially. Still present. Since being back on abilify , hasn't noticed tics, abdominal jerks still the same, not worsen or better.  She is amenable to decreasing abilify  further  Mood:  "pretty good", since she feels better after viral illness. Denied feeling depressed. There is anxiety still present, namely around her meds, mri and her cpap. Discussed ssri, which she believes she has never tried, and about possibly starting one in the future. She wants to think about it.  Appetite: "too good", no change  Sleep: "for the most part its ok", does get tired and falls asleep when watching tv, otherwise she feels like it is restful. No issues staying or falling asleep. Not on CPAP due to claustrophobia.   Patient amenable to plan per above after discussing the risks, benefits, and side effects. Otherwise patient had no other questions or concerns and was amenable to plan per above.  Safety: Denied active and passive SI, HI, AVH.   Review of Systems  Constitutional:  Negative for appetite change.  HENT:  Negative for trouble swallowing.   Respiratory:  Negative for shortness of breath.   Cardiovascular:  Negative for chest pain.  Gastrointestinal:  Positive for constipation (chronic). Negative for abdominal pain.  Neurological:  Negative for dizziness, syncope and light-headedness.   Visit Diagnosis:    ICD-10-CM   1. Neuroleptic-induced parkinsonism (HCC)  G21.11 ARIPiprazole  (ABILIFY ) 15 MG tablet   T43.505A hydrOXYzine  (ATARAX ) 10  MG tablet    2. Claustrophobia  F40.240 buPROPion  (WELLBUTRIN  XL) 300 MG 24 hr tablet    ARIPiprazole  (ABILIFY ) 15 MG tablet    hydrOXYzine  (ATARAX ) 10 MG tablet    3. MDD (major depressive disorder), recurrent, in full remission (HCC)  F33.42 buPROPion  (WELLBUTRIN  XL) 300 MG 24 hr tablet    ARIPiprazole  (ABILIFY ) 15 MG tablet    4. Tourette's disease  F95.2 ARIPiprazole  (ABILIFY ) 15 MG tablet    hydrOXYzine  (ATARAX ) 10 MG tablet    5. Long term current use of antipsychotic medication  Z79.899 ARIPiprazole  (ABILIFY ) 15 MG tablet    hydrOXYzine  (ATARAX ) 10 MG tablet     Past Psychiatric History:  Diagnoses: MDD, GAD, Tourette's disease,  Neuroleptic-induced parkinsonism (abilify ) Medication trials:  Lamictal (allergic rxn - ), risperdal (side effects, leg weakness), brintellix (side effects, GI), clonidine (ineffective), pimozide  (weight gain, dc to try another rx to help with tics and depression) Clonazepam  (PRN 2015-2016), vistaril , cogentin  (side effects constipation, urinary hesitancy) Current: Abilify  (2015-current, side effects of parkinsonism at >=20 mg), wellbutrin  (450 mg caused HTN, at 300 mg since 2015-current) Previous psychiatrist/therapist:  [place] Job, MD (2015-2024), [place] [facility] (2024-current) Hospitalizations: Denied  Suicide attempts: Denied  SIB: Denied Hx of violence towards others: Denied Current access to guns: Denied Trauma/abuse: Denied  Substance Use History: EtOH:  reports no history of alcohol use. Nicotine:  reports that she has never smoked. She has never used smokeless tobacco. Marijuana: Denied IV drug use: Denied Stimulants: Denied Opiates: Denied Sedative/hypnotics: Denied Hallucinogens: Denied DT: Denied Detox: Denied Residential: Denied   Past Medical History: Dx:  has a past medical history of Anxiety, Arthritis, Asthma, Bursitis, Complication of anesthesia, Costochondral chest pain, Degenerative disk disease, Depression, Gastroparesis, Generalized anxiety disorder (02/11/2016), GERD (gastroesophageal reflux disease), Hypertension, Long term current use of antipsychotic medication (10/28/2023), Lumbar herniated disc, MDD (major depressive disorder), recurrent, in full remission (HCC) (07/11/2014), Premature atrial beats (12/2019), Sleep apnea, Stenosis of lumbosacral spine, Stress fracture of foot, Thyroid  nodule, Tourette disease, and Trigeminal nerve disease.  Head trauma: Denied Seizures: Denied Allergies: Amoxicillin, Avelox [moxifloxacin hcl in nacl], Adhesive [tape], Bextra [valdecoxib], Doxycycline, Doxycycline hyclate, Gadobutrol, Gadolinium derivatives,  Gadoversetamide, Iodinated contrast media, Lamictal [lamotrigine], Naproxen, Oxycodone -acetaminophen , Percolone [oxycodone ], Relafen [nabumetone], Risperdal [risperidone], Nitrofurantoin, Brintellix [vortioxetine], Fetzima [levomilnacipran], Moxifloxacin, and Sulfamethoxazole-trimethoprim   Family Psychiatric History:  Denied psychiatric family history Others: sisterwith IDD, dad with dementia  Social History: Current Place of Residence: [place] of Birth: [place] Family Members: living with 2 sisters Brother deceased - 01/27/2021 Mom deceased - 2017-01-27 Dad decreased - 2014 Has adult nibling and younger grand-niblings Marital Status:  Single, never married Children: denies Relationships: denies Education:  Designer, television/film set, CNA Religious Beliefs/Practices: Goes to church, if physically unable, she will watch on TV History of Abuse: none 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 CNA Currently unemployed, disabilities since 27-Jan-2014 Legal History: denies Hobbies/Interests: reading, coloring, TV  Past Medical History:  Past Medical History:  Diagnosis Date   Anxiety    Arthritis    Back and hip   Asthma    Bursitis    Complication of anesthesia    bladder hard to wake up, never needed a in and out cath   Costochondral chest pain    Degenerative disk disease    Depression    Gastroparesis    Generalized anxiety disorder 02/11/2016  GERD (gastroesophageal reflux disease)    Hypertension    Long term current use of antipsychotic medication 10/28/2023   Lumbar herniated disc    MDD (major depressive disorder), recurrent, in full remission (HCC) 07/11/2014   Premature atrial beats 12/2019   Sleep apnea    not using CPAP   Stenosis of lumbosacral spine    Stress fracture of foot    Thyroid  nodule    Tourette disease    Trigeminal nerve disease     Past Surgical History:   Procedure Laterality Date   BACK SURGERY  09/19/2015   bone spur removal on back     CHOLECYSTECTOMY  2005   COLONOSCOPY     DILATION AND CURETTAGE OF UTERUS  11/13/2022   ELBOW SURGERY Left 05/29/2014   ESOPHAGOGASTRODUODENOSCOPY     KNEE SURGERY  06/2016   polyp removal     POSTERIOR FUSION LUMBAR SPINE  06/26/2017   ruptured disk  2008   TEAR DUCT PROBING  2009   Family History:  Family History  Problem Relation Age of Onset   Mental retardation Sister    Dementia Father    Cancer Mother    Cancer Brother        colorectal   Suicidality Neg Hx    Social History   Socioeconomic History   Marital status: Single    Spouse name: Not on file   Number of children: Not on file   Years of education: Not on file   Highest education level: High school graduate  Occupational History    Comment: disabled   Tobacco Use   Smoking status: Never   Smokeless tobacco: Never  Vaping Use   Vaping status: Never Used  Substance and Sexual Activity   Alcohol use: No   Drug use: No   Sexual activity: Not Currently  Other Topics Concern   Not on file  Social History Narrative   Lives with 2 sisters   Caffeine- none   Social Drivers of Corporate investment banker Strain: Not on file  Food Insecurity: Low Risk  (01/05/2024)   Received from [facility]   Hunger Vital Sign    Worried About Running Out of Food in the Last Year: Never true    Ran Out of Food in the Last Year: Never true  Transportation Needs: No Transportation Needs (01/05/2024)   Received from [facility]    In the past 12 months, has lack of reliable transportation kept you from medical appointments, meetings, work or from getting things needed for daily living? : No  Physical Activity: Not on file  Stress: Not on file  Social Connections: Not on file   Allergies:  Allergies  Allergen Reactions   Amoxicillin Hives and Swelling    LIP SWELLING PATIENT HAS HAD A PCN REACTION WITH  IMMEDIATE RASH, FACIAL/TONGUE/THROAT SWELLING, SOB, OR LIGHTHEADEDNESS WITH HYPOTENSION:  #  #  #  YES  #  #  #   HAS PT DEVELOPED SEVERE RASH INVOLVING MUCUS MEMBRANES or SKIN NECROSIS: #  #  #  YES  #  #  #  Has patient had a PCN reaction that required hospitalization: No Has patient had a PCN reaction occurring within the last 10 years: Unknown If all of the above answers are "NO", then may proceed with Cephalosporin use.    Avelox [Moxifloxacin Hcl In Nacl] Other (See Comments)    Caused pain in shoulders to finger, tendonitis   Adhesive [  Tape] Dermatitis    Blisters skin   Bextra [Valdecoxib] Hives and Swelling   Doxycycline Other (See Comments)   Doxycycline Hyclate Hives and Swelling   Gadobutrol Hives   Gadolinium Derivatives Hives   Gadoversetamide Hives   Iodinated Contrast Media Hives   Lamictal [Lamotrigine] Hives   Naproxen Hypertension   Oxycodone -Acetaminophen  Other (See Comments)   Percolone [Oxycodone ] Hives   Relafen [Nabumetone] Hypertension   Risperdal [Risperidone] Other (See Comments)    Leg weakness    Nitrofurantoin Other (See Comments)   Brintellix [Vortioxetine] Nausea And Vomiting   Fetzima [Levomilnacipran] Other (See Comments)    Tremors across head    Moxifloxacin Other (See Comments)    Caused pain in shoulders to finger, tendonitis   Sulfamethoxazole-Trimethoprim Rash    Flushing and rash   Current Medications: Current Outpatient Medications  Medication Sig Dispense Refill   hydrOXYzine  (ATARAX ) 10 MG tablet Take 1 tablet (10 mg total) by mouth daily as needed. 90 tablet 0   Acetaminophen  (TYLENOL  PO) Take 1 tablet by mouth daily as needed (PAIN).     albuterol  (PROVENTIL ) (2.5 MG/3ML) 0.083% nebulizer solution CAN USE ONE VIAL IN NEBULIZER EVERY FOUR TO SIX HOURS AS NEEDED FOR COUGH OR WHEEZE. 150 mL 1   albuterol  (VENTOLIN  HFA) 108 (90 Base) MCG/ACT inhaler INHALE 2 PUFFS BY MOUTH EVERY 4 HOURS AS NEEDED FOR WHEEZE OR FOR SHORTNESS OF BREATH  18 each 0   allopurinol (ZYLOPRIM) 100 MG tablet Take by mouth 2 (two) times daily.      ARIPiprazole  (ABILIFY ) 15 MG tablet Take 1 tablet (15 mg total) by mouth daily. Please take with 1 tablet of abilify  5 mg, for a total dose of 25 mg every day 90 tablet 0   aspirin  81 MG tablet Take 81 mg by mouth daily.     azithromycin  (ZITHROMAX ) 250 MG tablet TAKE 1 TABLET BY MOUTH THREE TIMES A WEEK. 12 tablet 5   buPROPion  (WELLBUTRIN  XL) 300 MG 24 hr tablet Take 1 tablet (300 mg total) by mouth daily. 90 tablet 0   Cholecalciferol (VITAMIN D -3) 5000 UNITS TABS Take 5,000 Units by mouth daily.      clotrimazole-betamethasone  (LOTRISONE) cream SMARTSIG:sparingly Topical Twice Daily     famotidine  (PEPCID ) 20 MG tablet TAKE 1 TABLET BY MOUTH TWICE A DAY 180 tablet 0   flecainide (TAMBOCOR) 50 MG tablet SMARTSIG:1 Tablet(s) By Mouth Every 12 Hours     furosemide  (LASIX ) 20 MG tablet Take 20 mg by mouth daily.     ipratropium (ATROVENT) 0.03 % nasal spray USE 2 SPRAYS IN NOSTRIL (S) 2 TIMES DAILY AS DIRECTED  11   ketoconazole  (NIZORAL ) 2 % shampoo Apply 1 application topically as needed for irritation.      loratadine  (CLARITIN ) 10 MG tablet Take 10 mg by mouth at bedtime.      losartan (COZAAR) 50 MG tablet Take 50 mg by mouth daily.     lubiprostone  (AMITIZA ) 24 MCG capsule Take 24 mcg by mouth daily as needed for constipation.      meclizine (ANTIVERT) 25 MG tablet PLEASE SEE ATTACHED FOR DETAILED DIRECTIONS     meloxicam  (MOBIC ) 7.5 MG tablet Take 7.5 mg by mouth 2 (two) times daily.  0   methocarbamol (ROBAXIN) 500 MG tablet Take 500 mg by mouth 4 (four) times daily.     metoprolol succinate (TOPROL-XL) 25 MG 24 hr tablet Take by mouth 2 (two) times daily.     Mometasone  Furoate (ASMANEX  HFA) 200  MCG/ACT AERO Inhale two puffs once daily as directed.  Rinse, gargle, and spit after use. 39 g 1   montelukast  (SINGULAIR ) 10 MG tablet TAKE 1 TABLET BY MOUTH EVERY DAY 90 tablet 0   mupirocin  ointment  (BACTROBAN ) 2 % Place 1 application into the nose 2 (two) times daily as needed (IRRITATION).      ondansetron  (ZOFRAN -ODT) 4 MG disintegrating tablet Take 4 mg by mouth 2 (two) times daily as needed for nausea or vomiting.     pregabalin  (LYRICA ) 75 MG capsule Take 75 mg by mouth 2 (two) times daily. Reported on 10/09/2015     Spacer/Aero-Holding Chambers (AEROCHAMBER PLUS) inhaler Use as instructed with inhaler. 1 each 2   spironolactone  (ALDACTONE ) 25 MG tablet Take 25 mg by mouth daily.     Tiotropium Bromide Monohydrate  (SPIRIVA  RESPIMAT) 1.25 MCG/ACT AERS Inhale 2 puffs into the lungs daily. 12 g 1   traMADol  (ULTRAM ) 50 MG tablet Take 50 mg by mouth.     triamcinolone  cream (KENALOG ) 0.1 % Apply 1 application topically 2 (two) times daily as needed (IRRITATION).      vitamin B-12 (CYANOCOBALAMIN ) 1000 MCG tablet Take 1,000 mcg by mouth daily.     No current facility-administered medications for this visit.   Objective: Psychiatric Specialty Exam: There were no vitals taken for this visit.There is no height or weight on file to calculate BMI.  General Appearance: Casual, fairly groomed  Eye Contact:  Good    Speech:  Clear, coherent, normal rate, spontaneous  Volume:  Normal   Mood:  see above  Affect:  Appropriate, congruent, full range  Thought Content: Logical, rumination  Suicidal Thoughts: see above  Thought Process:  Coherent, goal-directed, linear   Orientation:  A&Ox4   Memory:  Immediate good  Judgment:  Good   Insight:  Good   Concentration:  Attention and concentration good   Recall:  Good  Fund of Knowledge: Good  Language: Good, fluent  Psychomotor Activity: B/l resting tremors in hands, (L), still present. None in motion.  Ambulates with a cane. Posture is stooped over, slow to get up and turn around. No shuffling gait, able to pick her feet up when she walks.  Akathisia:  see above  AIMS (if indicated): see above  Assets:  Communication Skills Desire for  Improvement Financial Resources/Insurance Housing Leisure Time Resilience Social Support Talents/Skills  ADL's:  Intact mostly, ambulates with a cane  Cognition: WNL  Sleep:  see above    Physical Exam Vitals and nursing note reviewed.  Constitutional:      General: She is awake. She is not in acute distress.    Appearance: She is not toxic-appearing or diaphoretic.  HENT:     Head: Normocephalic and atraumatic.  Eyes:     Conjunctiva/sclera: Conjunctivae normal.  Pulmonary:     Effort: Pulmonary effort is normal. No respiratory distress.  Neurological:     General: No focal deficit present.     Mental Status: She is alert and oriented to person, place, and time.     Gait: Gait abnormal.     Metabolic Disorder Labs: No results found for: "HGBA1C", "MPG" Lab Results  Component Value Date   PROLACTIN 13.1 06/09/2017   No results found for: "CHOL", "TRIG", "HDL", "CHOLHDL", "VLDL", "LDLCALC" No results found for: "TSH"  Therapeutic Level Labs: No results found for: "LITHIUM" No results found for: "VALPROATE" No results found for: "CBMZ"  Screenings: PHQ2-9    Flowsheet Row [facility]  [facility] Office Visit from [facility]/11/2022 in [facility] Office Visit from 03/13/2022 [facility] Office Visit from 11/14/2021 in [facility] Video Visit from 07/25/2021 [facility]  PHQ-2 Total Score 1 1 0 1 1      Flowsheet Row [facility]2023 [facility] Office Visit from 09/11/2022 in [facility] Office Visit from 03/13/2022 in [facility]  C-SSRS RISK CATEGORY No Risk No Risk No Risk      Collaboration of Care: Collaboration of Care: see  above  Patient/Guardian was advised Release of Information must be obtained prior to any record release in order to collaborate their care with an outside provider. Patient/Guardian was advised if they have not already done so to contact the registration department to sign all necessary forms in order for us  to release information regarding their care.   Consent: Patient/Guardian gives verbal consent for treatment and assignment of benefits for services provided during this visit. Patient/Guardian expressed understanding and agreed to proceed.   60 minutes were spent in chart review, interview, psycho education, counseling, medical decision making, coordination of care and long-term prognosis.  Patient was given opportunity to ask question and all concerns and questions were addressed and answers. Excluding separately billable services.  Darielys Giglia, DO Psych Resident, PGY-3

## 2024-01-18 ENCOUNTER — Telehealth (HOSPITAL_COMMUNITY): Payer: Self-pay

## 2024-01-18 DIAGNOSIS — T43505A Adverse effect of unspecified antipsychotics and neuroleptics, initial encounter: Secondary | ICD-10-CM

## 2024-01-18 DIAGNOSIS — F40240 Claustrophobia: Secondary | ICD-10-CM

## 2024-01-18 DIAGNOSIS — Z79899 Other long term (current) drug therapy: Secondary | ICD-10-CM

## 2024-01-18 DIAGNOSIS — F3342 Major depressive disorder, recurrent, in full remission: Secondary | ICD-10-CM

## 2024-01-18 DIAGNOSIS — F952 Tourette's disorder: Secondary | ICD-10-CM

## 2024-01-18 MED ORDER — HYDROXYZINE HCL 10 MG PO TABS: 10.0000 mg | ORAL_TABLET | Freq: Every day | ORAL | 0 refills | Status: DC | PRN

## 2024-01-18 MED ORDER — ARIPIPRAZOLE 15 MG PO TABS
15.0000 mg | ORAL_TABLET | Freq: Every day | ORAL | 0 refills | Status: DC
Start: 2024-01-18 — End: 2024-03-07

## 2024-01-18 NOTE — Telephone Encounter (Signed)
 Thu Baggett @ [phone] - able to get in contact  Currently vistaril  10 mg daily PRN for anxiety  Vistaril  - last dose was Monday Abilify  20 mg - last time was Wednesday or Thursday Abilify  15 mg - started Friday  Noted tremors of both arms on saturday. This resolved after restarting the vistaril . Still has some tremors but not bad and much improved.   Denied any other med changes or changes in dosing.   Discussed with her that I'm not 100% sure why she had that reaction whether the hydroxyzine  was masking another med side effect or unlikely but withdrawal from hydroxyzine .   I don't believe that it is directly the abilify  as when decreasing dose, would expect decreased side effects, rather indirectly through drug interactions, but again unsure.   Since the tremors are nearly resolved, instructed her to continue taking vistaril  10 mg daily. Sent in refills. She is to call if tremors get worst again.  Future Appointments  Date Time Provider Department Center  01/28/2024 10:40 AM Jerelene Monday, Rema Care, MD [facility] None  03/01/2024 10:45 AM Clemetine Cypher, DPM [facility]  03/09/2024 10:30 AM Baltazar Bonier, MD [facility] None

## 2024-01-18 NOTE — Telephone Encounter (Signed)
 Patient called to let you know that she started the Abilify  and on Saturday her tremor got really bad so she took a Hydroxyzine . Patient states she is still taking the Hydroxyzine  because without it her tremors are worse. Please review and advise, thank you

## 2024-01-20 ENCOUNTER — Other Ambulatory Visit: Payer: Self-pay | Admitting: Allergy and Immunology

## 2024-01-28 ENCOUNTER — Encounter: Payer: Self-pay | Admitting: Allergy and Immunology

## 2024-01-28 ENCOUNTER — Ambulatory Visit: Admitting: Allergy and Immunology

## 2024-01-28 VITALS — BP 126/82 | HR 85 | Resp 16

## 2024-01-28 DIAGNOSIS — J4540 Moderate persistent asthma, uncomplicated: Secondary | ICD-10-CM | POA: Diagnosis not present

## 2024-01-28 DIAGNOSIS — J3089 Other allergic rhinitis: Secondary | ICD-10-CM

## 2024-01-28 DIAGNOSIS — K219 Gastro-esophageal reflux disease without esophagitis: Secondary | ICD-10-CM | POA: Diagnosis not present

## 2024-01-28 DIAGNOSIS — L719 Rosacea, unspecified: Secondary | ICD-10-CM

## 2024-01-28 NOTE — Patient Instructions (Addendum)
  1. Continue azithromycin  250 mg - 1 tablet 3 times per week  2. During periods of asthma activity can use the following:  A. Asmanex  200 - 2 inhalations 1 time a day with spacer  B. Spiriva  1.25 Respimat -2 inhalations 1 time per day    3. During upper airway symptoms can use the following:  A. OTC Nasacort  one spray each 1 time per day   4. If needed:  A. Famotidine  20 mg 1-2 times per day  B. Albuterol  - 2 inhalations every 4-6 hours C. Antihistamine - Claritin    5. Influenza = Tamiflu. Covid = Paxlovid  6. Return to clinic in 12 months or earlier if problem

## 2024-01-28 NOTE — Progress Notes (Unsigned)
 [place] [facility] [place] [facility] [place] [facility] [place] [facility] [place]   Follow-up Note  Referring Provider: Abbe Hoard., MD Primary Provider: Abbe Hoard., MD Date of Office Visit: 01/28/2024  Subjective:   Penny Porter (DOB: [birth year]) is a 58 y.o. female who [facility] in re-evaluation of the following:  HPI: Penny Porter returns to this clinic in evaluation of asthma, allergic rhinitis, LPR.  I last saw in this clinic 29 Dec 2022.  Her airway issue has been relatively nonexistent since I have last seen her in this clinic.  She only uses her anti-inflammatory agents for airway on an as-needed basis.  She has not had to use her Asmanex  or Spiriva  and rarely uses any Nasacort .  She is completely stopped all montelukast .  She has no need to use the short acting bronchodilator.  Her reflux is also under very good control and she rarely uses any famotidine  at this point in time.  She continues on azithromycin  for her rosacea which she believes is under very good control with this single agent.  Allergies as of 01/28/2024       Reactions   Amoxicillin Hives, Swelling   LIP SWELLING PATIENT HAS HAD A PCN REACTION WITH IMMEDIATE RASH, FACIAL/TONGUE/THROAT SWELLING, SOB, OR LIGHTHEADEDNESS WITH HYPOTENSION:  #  #  #  YES  #  #  #   HAS PT DEVELOPED SEVERE RASH INVOLVING MUCUS MEMBRANES or SKIN NECROSIS: #  #  #  YES  #  #  #  Has patient had a PCN reaction that required hospitalization: No Has patient had a PCN reaction occurring within the last 10 years: Unknown If all of the above answers are NO, then may proceed with Cephalosporin use.   Avelox [moxifloxacin Hcl In Nacl] Other (See Comments)   Caused pain in shoulders to finger, tendonitis   Adhesive [tape] Dermatitis   Blisters skin   Bextra [valdecoxib] Hives, Swelling   Doxycycline Other (See Comments)   Doxycycline Hyclate Hives, Swelling   Gadobutrol Hives   Gadolinium Derivatives Hives    Gadoversetamide Hives   Iodinated Contrast Media Hives   Lamictal [lamotrigine] Hives   Naproxen Hypertension   Oxycodone -acetaminophen  Other (See Comments)   Percolone [oxycodone ] Hives   Relafen [nabumetone] Hypertension   Risperdal [risperidone] Other (See Comments)   Leg weakness   Nitrofurantoin Other (See Comments)   Brintellix [vortioxetine] Nausea And Vomiting   Fetzima [levomilnacipran] Other (See Comments)   Tremors across head   Moxifloxacin Other (See Comments)   Caused pain in shoulders to finger, tendonitis   Sulfamethoxazole-trimethoprim Rash   Flushing and rash        Medication List    AeroChamber Plus inhaler Use as instructed with inhaler.   albuterol  (2.5 MG/3ML) 0.083% nebulizer solution Commonly known as: PROVENTIL  CAN USE ONE VIAL IN NEBULIZER EVERY FOUR TO SIX HOURS AS NEEDED FOR COUGH OR WHEEZE.   albuterol  108 (90 Base) MCG/ACT inhaler Commonly known as: VENTOLIN  HFA INHALE 2 PUFFS BY MOUTH EVERY 4 HOURS AS NEEDED FOR WHEEZE OR FOR SHORTNESS OF BREATH   allopurinol 100 MG tablet Commonly known as: ZYLOPRIM Take by mouth 2 (two) times daily.   ARIPiprazole  15 MG tablet Commonly known as: ABILIFY  Take 1 tablet (15 mg total) by mouth daily.   Asmanex  HFA 200 MCG/ACT Aero Generic drug: Mometasone  Furoate Inhale two puffs once daily as directed.  Rinse, gargle, and spit after use.   aspirin  81 MG  tablet Take 81 mg by mouth daily.   azithromycin  250 MG tablet Commonly known as: ZITHROMAX  TAKE 1 TABLET BY MOUTH THREE TIMES A WEEK.   buPROPion  300 MG 24 hr tablet Commonly known as: WELLBUTRIN  XL Take 1 tablet (300 mg total) by mouth daily.   clotrimazole-betamethasone  cream Commonly known as: LOTRISONE SMARTSIG:sparingly Topical Twice Daily   cyanocobalamin  1000 MCG tablet Commonly known as: VITAMIN B12 Take 1,000 mcg by mouth daily.   famotidine  20 MG tablet Commonly known as: PEPCID  TAKE 1 TABLET BY MOUTH TWICE A DAY    flecainide 50 MG tablet Commonly known as: TAMBOCOR SMARTSIG:1 Tablet(s) By Mouth Every 12 Hours   furosemide  20 MG tablet Commonly known as: LASIX  Take 20 mg by mouth daily.   hydrOXYzine  10 MG tablet Commonly known as: ATARAX  Take 1 tablet (10 mg total) by mouth daily as needed.   ipratropium 0.03 % nasal spray Commonly known as: ATROVENT USE 2 SPRAYS IN NOSTRIL (S) 2 TIMES DAILY AS DIRECTED   ketoconazole  2 % shampoo Commonly known as: NIZORAL  Apply 1 application topically as needed for irritation.   loratadine  10 MG tablet Commonly known as: CLARITIN  Take 10 mg by mouth at bedtime.   losartan 50 MG tablet Commonly known as: COZAAR Take 50 mg by mouth daily.   lubiprostone  24 MCG capsule Commonly known as: AMITIZA  Take 24 mcg by mouth daily as needed for constipation.   meclizine 25 MG tablet Commonly known as: ANTIVERT PLEASE SEE ATTACHED FOR DETAILED DIRECTIONS   meloxicam  7.5 MG tablet Commonly known as: MOBIC  Take 7.5 mg by mouth 2 (two) times daily.   methocarbamol 500 MG tablet Commonly known as: ROBAXIN Take 500 mg by mouth 4 (four) times daily.   metoprolol succinate 25 MG 24 hr tablet Commonly known as: TOPROL-XL Take by mouth 2 (two) times daily.   montelukast  10 MG tablet Commonly known as: SINGULAIR  TAKE 1 TABLET BY MOUTH EVERY DAY   mupirocin  ointment 2 % Commonly known as: BACTROBAN  Place 1 application into the nose 2 (two) times daily as needed (IRRITATION).   ondansetron  4 MG disintegrating tablet Commonly known as: ZOFRAN -ODT Take 4 mg by mouth 2 (two) times daily as needed for nausea or vomiting.   pregabalin  75 MG capsule Commonly known as: LYRICA  Take 75 mg by mouth 2 (two) times daily. Reported on 10/09/2015   Spiriva  Respimat 1.25 MCG/ACT Aers Generic drug: Tiotropium Bromide Monohydrate  Inhale 2 puffs into the lungs daily.   spironolactone  25 MG tablet Commonly known as: ALDACTONE  Take 25 mg by mouth daily.   traMADol   50 MG tablet Commonly known as: ULTRAM  Take 50 mg by mouth.   triamcinolone  cream 0.1 % Commonly known as: KENALOG  Apply 1 application topically 2 (two) times daily as needed (IRRITATION).   TYLENOL  PO Take 1 tablet by mouth daily as needed (PAIN).   Vitamin D -3 125 MCG (5000 UT) Tabs Take 5,000 Units by mouth daily.    Past Medical History:  Diagnosis Date   Anxiety    Arthritis    Back and hip   Asthma    Bursitis    Complication of anesthesia    bladder hard to wake up, never needed a in and out cath   Costochondral chest pain    Degenerative disk disease    Depression    Gastroparesis    Generalized anxiety disorder 02/11/2016   GERD (gastroesophageal reflux disease)    Hypertension    Long term current use of antipsychotic  medication 10/28/2023   Lumbar herniated disc    MDD (major depressive disorder), recurrent, in full remission (HCC) 07/11/2014   Premature atrial beats 12/2019   Sleep apnea    not using CPAP   Stenosis of lumbosacral spine    Stress fracture of foot    Thyroid  nodule    Tourette disease    Trigeminal nerve disease     Past Surgical History:  Procedure Laterality Date   BACK SURGERY  09/19/2015   bone spur removal on back     CHOLECYSTECTOMY  2005   COLONOSCOPY     DILATION AND CURETTAGE OF UTERUS  11/13/2022   ELBOW SURGERY Left 05/29/2014   ESOPHAGOGASTRODUODENOSCOPY     KNEE SURGERY  06/2016   polyp removal     POSTERIOR FUSION LUMBAR SPINE  06/26/2017   ruptured disk  2008   TEAR DUCT PROBING  2009    Review of systems negative except as noted in HPI / PMHx or noted below:  Review of Systems  Constitutional: Negative.   HENT: Negative.    Eyes: Negative.   Respiratory: Negative.    Cardiovascular: Negative.   Gastrointestinal: Negative.   Genitourinary: Negative.   Musculoskeletal: Negative.   Skin: Negative.   Neurological: Negative.   Endo/Heme/Allergies: Negative.   Psychiatric/Behavioral: Negative.        Objective:   Vitals:   01/28/24 1046  BP: 126/82  Pulse: 85  Resp: 16  SpO2: 97%          Physical Exam Constitutional:      Appearance: She is not diaphoretic.  HENT:     Head: Normocephalic.     Right Ear: Tympanic membrane, ear canal and external ear normal.     Left Ear: Tympanic membrane, ear canal and external ear normal.     Nose: Nose normal. No mucosal edema or rhinorrhea.     Mouth/Throat:     Pharynx: Uvula midline. No oropharyngeal exudate.   Eyes:     Conjunctiva/sclera: Conjunctivae normal.   Neck:     Thyroid : No thyromegaly.     Trachea: Trachea normal. No tracheal tenderness or tracheal deviation.   Cardiovascular:     Rate and Rhythm: Normal rate and regular rhythm.     Heart sounds: Normal heart sounds, S1 normal and S2 normal. No murmur heard. Pulmonary:     Effort: No respiratory distress.     Breath sounds: Normal breath sounds. No stridor. No wheezing or rales.  Lymphadenopathy:     Head:     Right side of head: No tonsillar adenopathy.     Left side of head: No tonsillar adenopathy.     Cervical: No cervical adenopathy.   Skin:    Findings: No erythema or rash.     Nails: There is no clubbing.   Neurological:     Mental Status: She is alert.     Diagnostics: Spirometry was performed and demonstrated an FEV1 of 1.81 at 61 % of predicted.   Assessment and Plan:   1. Asthma, moderate persistent, well-controlled   2. Other allergic rhinitis   3. LPRD (laryngopharyngeal reflux disease)   4. Rosacea    1. Continue azithromycin  250 mg - 1 tablet 3 times per week  2. During periods of asthma activity can use the following:  A. Asmanex  200 - 2 inhalations 1 time a day with spacer  B. Spiriva  1.25 Respimat -2 inhalations 1 time per day    3. During upper airway symptoms  can use the following:  A. OTC Nasacort  one spray each 1 time per day   4. If needed:  A. Famotidine  20 mg 1-2 times per day  B. Albuterol  - 2  inhalations every 4-6 hours C. Antihistamine - Claritin    5. Influenza = Tamiflu. Covid = Paxlovid  6. Return to clinic in 12 months or earlier if problem  Dreamer appears to be doing very well with as needed use of most of her medications and she can continue to utilize the plan noted above directed against respiratory tract inflammation.  And she can always start famotidine  should she develop problems with reflux.  Her azithromycin  therapy for her rosacea appears to be working quite well.  Assuming she does well with the plan noted above I will see her back in this clinic in 12 months or earlier if there is a problem.   Schuyler Custard, MD Allergy / Immunology [facility]

## 2024-02-01 ENCOUNTER — Encounter: Payer: Self-pay | Admitting: Allergy and Immunology

## 2024-03-01 ENCOUNTER — Other Ambulatory Visit: Payer: Self-pay | Admitting: Allergy and Immunology

## 2024-03-01 ENCOUNTER — Ambulatory Visit: Admitting: Podiatry

## 2024-03-01 DIAGNOSIS — B351 Tinea unguium: Secondary | ICD-10-CM | POA: Diagnosis not present

## 2024-03-01 DIAGNOSIS — M79676 Pain in unspecified toe(s): Secondary | ICD-10-CM

## 2024-03-01 NOTE — Progress Notes (Signed)
 She presents today for follow-up of her flexor tenotomy third digit right foot.  She is having no problem with it whatsoever she has painful elongated nails.  Objective: Toenails are long thick yellow dystrophic clinically mycotic.  Surgical site is gone on to heal uneventfully.  There is no further ulceration of the toe.  Assessment: Well-healing surgical toe painful mycotic nails.  Debrided toenails 1 through 5 bilateral follow-up with her in 3 months

## 2024-03-04 NOTE — Progress Notes (Cosign Needed Addendum)
 BH MD Outpatient Progress Note  03/07/2024 5:45 PM Penny Porter  MRN:  [id number]  Assessment:  Arliss Hepburn presents for follow-up evaluation in-person on 03/07/24 .   The patient carries a diagnosis of SGA-EPS with parkinsonism, currently manifesting as intermittent tremors. These have been present since 2018 and appear to be non-progressive and without a consistent pattern. Chart review indicates Parkinson's disease has previously been ruled out, and the most recent AIMS evaluation does not suggest worsening movement disorder. While she remains on chronic antipsychotic therapy with Abilify , prior dose adjustments did not lead to meaningful improvement in tremors, and she continues to report good psychiatric stability. MDD and GAD remain in full remission, and Tourette's disorder is well-controlled on Abilify . Given that the tremors are sporadic and not clearly linked to medication side effects at this time, and considering that Abilify  appears to be playing a significant role in maintaining mood and tic stability, no medication changes are recommended. We will review journaling entries moving forward to evaluate whether psychosocial stressors are temporally associated with tremor episodes.  Chart Review Recent Labs/Imaging: 01/05/2024 labs include CBC showing elevated Hgb 16.13/HCT 48.3; TSH WNL; lipid panel showing elevated TGs 273, HDL was low 39; A1c 5.7% within prediabetic range; CMP unremarkable  Identifying Information: Penny Porter is a 58 y.o. female with a documented history of Tourette's with long-term antipsychotic therapy, MDD/GAD in remission, claustrophobia, and SGA induced parkinsonism no suicide attempts or inpatient psych admission, who is an established patient with [facility] for management of mood.    The patient's PMHx is significant for asthma, DDD, GERD, HTN, premature atrial beats, OSA, stenosis of lumbosacral spine.   Plan:  # SGA-induced EPS -  parkinsonism # Long-term antipsychotic use Past medication trials: cogentin , vistaril  Status of problem: new to me Interventions: AIMS: 0 (01/11/2024) EKG: 01/05/2024 HR 68, Qt/Qtc 412/438, NSR from PCP (brought in physical copy in prior visit) Continue Abilify  15 mg qAM    # Claustrophobia # MDD in remission # GAD in remission Past medication trials: lamictal, risperdal, brintelliz, clonidine, klonopin , vistaril , cogentin  Status of problem: ongoing Therapy: declined, continue to offer Continued home Wellbutrin  300 mg qAM Continue home hydroxyzine  10 mg  daily PRN Abilify  per above   # H/O Tourettes d/o Past medication trials:  Status of problem: stable Abilify  per above    Health Maintenance PCP: Thurmond Cathlyn LABOR., MD    H/O OSA (no CPAP/jaw) - claustrophobic  Patient was given contact information for behavioral health clinic and was instructed to call 911 for emergencies.   Subjective:  Chief Complaint:  Chief Complaint  Patient presents with   Follow-up   Tremors    Interval History:  Patient presents to the office in her wheelchair and reports that her depression and anxiety are well controlled on her current psychiatric regimen. She expresses satisfaction with her medications and wishes to remain on Abilify . She continues to experience intermittent tremors, which she finds intolerable when present, though none were observed during today's visit. She states these tremors have been an issue since 2018 and is unsure of any specific triggers, but notes that they tend to be absent when she is engaged in activities she enjoys. She spends her free time helping her sister cook, doing puzzles, and coloring, which she finds calming. We discussed reintroducing journaling to explore whether stressors may be contributing to the tremors.  Denies medication side effects. Sleep: good Appetite: good Caffeine: none Recent substance use: denies SI: denies  Visit Diagnosis:     ICD-10-CM   1. Neuroleptic-induced parkinsonism (HCC)  G21.11 ARIPiprazole  (ABILIFY ) 15 MG tablet   T43.505A hydrOXYzine  (ATARAX ) 10 MG tablet    2. Claustrophobia  F40.240 ARIPiprazole  (ABILIFY ) 15 MG tablet    buPROPion  (WELLBUTRIN  XL) 300 MG 24 hr tablet    hydrOXYzine  (ATARAX ) 10 MG tablet    3. Tourette's disease  F95.2 ARIPiprazole  (ABILIFY ) 15 MG tablet    hydrOXYzine  (ATARAX ) 10 MG tablet    4. Long term current use of antipsychotic medication  Z79.899 ARIPiprazole  (ABILIFY ) 15 MG tablet    hydrOXYzine  (ATARAX ) 10 MG tablet    5. MDD (major depressive disorder), recurrent, in full remission (HCC)  F33.42 ARIPiprazole  (ABILIFY ) 15 MG tablet    buPROPion  (WELLBUTRIN  XL) 300 MG 24 hr tablet      Past Psychiatric History:  Diagnoses: MDD, GAD, Tourette's disease (See [facility] neurology clinic note from 10/05/2013 for detailed eval), Neuroleptic-induced parkinsonism (abilify ) Medication trials:  Lamictal (allergic rxn - ), risperdal (side effects, leg weakness), brintellix (side effects, GI), clonidine (ineffective), pimozide  (weight gain, dc to try another rx to help with tics and depression) Clonazepam  (PRN 2015-2016), vistaril , cogentin  (side effects constipation, urinary hesitancy) Current: Abilify  (2015-current, side effects of parkinsonism at >=20 mg), wellbutrin  (450 mg caused HTN, at 300 mg since 2015-current) Previous psychiatrist/therapist:  Johnie Cahill, MD (2015-2024), Renick Resis (2024-current) Hospitalizations: Denied  Suicide attempts: Denied  SIB: Denied Hx of violence towards others: Denied Current access to guns: Denied Trauma/abuse: Denied   Substance Use History: EtOH:  reports no history of alcohol use. Nicotine:  reports that she has never smoked. She has never used smokeless tobacco. Marijuana: Denied IV drug use: Denied Stimulants: Denied Opiates: Denied Sedative/hypnotics: Denied Hallucinogens: Denied DT: Denied Detox: Denied Residential:  Denied    Past Medical History: Dx:  has a past medical history of Anxiety, Arthritis, Asthma, Bursitis, Complication of anesthesia, Costochondral chest pain, Degenerative disk disease, Depression, Gastroparesis, Generalized anxiety disorder (02/11/2016), GERD (gastroesophageal reflux disease), Hypertension, Long term current use of antipsychotic medication (10/28/2023), Lumbar herniated disc, MDD (major depressive disorder), recurrent, in full remission (HCC) (07/11/2014), Premature atrial beats (12/2019), Sleep apnea, Stenosis of lumbosacral spine, Stress fracture of foot, Thyroid  nodule, Tourette disease, and Trigeminal nerve disease.  Head trauma: Denied Seizures: Denied Allergies: Amoxicillin, Avelox [moxifloxacin hcl in nacl], Adhesive [tape], Bextra [valdecoxib], Doxycycline, Doxycycline hyclate, Gadobutrol, Gadolinium derivatives, Gadoversetamide, Iodinated contrast media, Lamictal [lamotrigine], Naproxen, Oxycodone -acetaminophen , Percolone [oxycodone ], Relafen [nabumetone], Risperdal [risperidone], Nitrofurantoin, Brintellix [vortioxetine], Fetzima [levomilnacipran], Moxifloxacin, and Sulfamethoxazole-trimethoprim    Family Psychiatric History:  Denied psychiatric family history Others: sisterwith IDD, dad with dementia   Social History: Current Place of Residence: [place] of Birth: [place] Family Members: living with 2 sisters Brother deceased - March 23, 2021 Mom deceased - 03-23-17 Dad decreased - 2014 Has adult nibling and younger grand-niblings Marital Status:  Single, never married Children: denies Relationships: denies Education:  Designer, television/film set, CNA Religious Beliefs/Practices: Goes to church, if physically unable, she will watch on TV History of Abuse: none 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 CNA Currently unemployed, disabilities since 2014-03-23 Legal History:  denies Hobbies/Interests: reading, coloring, TV  Social History   Socioeconomic History   Marital status: Single    Spouse name: Not on file   Number of children: Not on file   Years of education: Not on file  Highest education level: High school graduate  Occupational History    Comment: disabled   Tobacco Use   Smoking status: Never   Smokeless tobacco: Never  Vaping Use   Vaping status: Never Used  Substance and Sexual Activity   Alcohol use: No   Drug use: No   Sexual activity: Not Currently  Other Topics Concern   Not on file  Social History Narrative   Lives with 2 sisters   Caffeine- none   Social Drivers of Health   Financial Resource Strain: Not on file  Food Insecurity: Low Risk  (01/05/2024)   Received from [facility]   Hunger Vital Sign    Within the past 12 months, you worried that your food would run out before you got money to buy more: Never true    Within the past 12 months, the food you bought just didn't last and you didn't have money to get more. : Never true  Transportation Needs: No Transportation Needs (01/05/2024)   Received from [facility]    In the past 12 months, has lack of reliable transportation kept you from medical appointments, meetings, work or from getting things needed for daily living? : No  Physical Activity: Not on file  Stress: Not on file  Social Connections: Not on file    Allergies:  Allergies  Allergen Reactions   Amoxicillin Hives and Swelling    LIP SWELLING PATIENT HAS HAD A PCN REACTION WITH IMMEDIATE RASH, FACIAL/TONGUE/THROAT SWELLING, SOB, OR LIGHTHEADEDNESS WITH HYPOTENSION:  #  #  #  YES  #  #  #   HAS PT DEVELOPED SEVERE RASH INVOLVING MUCUS MEMBRANES or SKIN NECROSIS: #  #  #  YES  #  #  #  Has patient had a PCN reaction that required hospitalization: No Has patient had a PCN reaction occurring within the last 10 years: Unknown If all of the above answers are NO, then may proceed with  Cephalosporin use.    Avelox [Moxifloxacin Hcl In Nacl] Other (See Comments)    Caused pain in shoulders to finger, tendonitis   Adhesive [Tape] Dermatitis    Blisters skin   Bextra [Valdecoxib] Hives and Swelling   Doxycycline Other (See Comments)   Doxycycline Hyclate Hives and Swelling   Gadobutrol Hives   Gadolinium Derivatives Hives   Gadoversetamide Hives   Iodinated Contrast Media Hives   Lamictal [Lamotrigine] Hives   Naproxen Hypertension   Oxycodone -Acetaminophen  Other (See Comments)   Percolone [Oxycodone ] Hives   Relafen [Nabumetone] Hypertension   Risperdal [Risperidone] Other (See Comments)    Leg weakness    Nitrofurantoin Other (See Comments)   Brintellix [Vortioxetine] Nausea And Vomiting   Fetzima [Levomilnacipran] Other (See Comments)    Tremors across head    Moxifloxacin Other (See Comments)    Caused pain in shoulders to finger, tendonitis   Sulfamethoxazole-Trimethoprim Rash    Flushing and rash    Current Medications: Current Outpatient Medications  Medication Sig Dispense Refill   Acetaminophen  (TYLENOL  PO) Take 1 tablet by mouth daily as needed (PAIN).     albuterol  (PROVENTIL ) (2.5 MG/3ML) 0.083% nebulizer solution CAN USE ONE VIAL IN NEBULIZER EVERY FOUR TO SIX HOURS AS NEEDED FOR COUGH OR WHEEZE. 150 mL 1   albuterol  (VENTOLIN  HFA) 108 (90 Base) MCG/ACT inhaler INHALE 2 PUFFS BY MOUTH EVERY 4 HOURS AS NEEDED FOR WHEEZE OR FOR SHORTNESS OF BREATH 18 each 2   allopurinol (ZYLOPRIM) 100 MG tablet Take by  mouth 2 (two) times daily.      aspirin  81 MG tablet Take 81 mg by mouth daily.     Cholecalciferol (VITAMIN D -3) 5000 UNITS TABS Take 5,000 Units by mouth daily.      clotrimazole-betamethasone  (LOTRISONE) cream SMARTSIG:sparingly Topical Twice Daily     famotidine  (PEPCID ) 20 MG tablet TAKE 1 TABLET BY MOUTH TWICE A DAY (Patient taking differently: TAKE 1 TABLET BY MOUTH TWICE A DAY) 180 tablet 0   flecainide (TAMBOCOR) 50 MG tablet SMARTSIG:1  Tablet(s) By Mouth Every 12 Hours     furosemide  (LASIX ) 20 MG tablet Take 20 mg by mouth daily.     ipratropium (ATROVENT) 0.03 % nasal spray USE 2 SPRAYS IN NOSTRIL (S) 2 TIMES DAILY AS DIRECTED  11   ketoconazole  (NIZORAL ) 2 % shampoo Apply 1 application topically as needed for irritation.      loratadine  (CLARITIN ) 10 MG tablet Take 10 mg by mouth at bedtime.      losartan (COZAAR) 50 MG tablet Take 50 mg by mouth daily.     lubiprostone  (AMITIZA ) 24 MCG capsule Take 24 mcg by mouth daily as needed for constipation.      meclizine (ANTIVERT) 25 MG tablet PLEASE SEE ATTACHED FOR DETAILED DIRECTIONS     meloxicam  (MOBIC ) 7.5 MG tablet Take 7.5 mg by mouth 2 (two) times daily.  0   methocarbamol (ROBAXIN) 500 MG tablet Take 500 mg by mouth 4 (four) times daily. (Patient taking differently: Take 500 mg by mouth 4 (four) times daily.)     metoprolol succinate (TOPROL-XL) 25 MG 24 hr tablet Take by mouth 2 (two) times daily.     Mometasone  Furoate (ASMANEX  HFA) 200 MCG/ACT AERO Inhale two puffs once daily as directed.  Rinse, gargle, and spit after use. 39 g 1   mupirocin  ointment (BACTROBAN ) 2 % Place 1 application into the nose 2 (two) times daily as needed (IRRITATION).      ondansetron  (ZOFRAN -ODT) 4 MG disintegrating tablet Take 4 mg by mouth 2 (two) times daily as needed for nausea or vomiting.     pregabalin  (LYRICA ) 75 MG capsule Take 75 mg by mouth 2 (two) times daily. Reported on 10/09/2015     Spacer/Aero-Holding Chambers (AEROCHAMBER PLUS) inhaler Use as instructed with inhaler. 1 each 2   spironolactone  (ALDACTONE ) 25 MG tablet Take 25 mg by mouth daily.     Tiotropium Bromide Monohydrate  (SPIRIVA  RESPIMAT) 1.25 MCG/ACT AERS Inhale 2 puffs into the lungs daily. 12 g 1   traMADol  (ULTRAM ) 50 MG tablet Take 50 mg by mouth.     triamcinolone  cream (KENALOG ) 0.1 % Apply 1 application topically 2 (two) times daily as needed (IRRITATION).      vitamin B-12 (CYANOCOBALAMIN ) 1000 MCG tablet  Take 1,000 mcg by mouth daily.     [START ON 04/11/2024] ARIPiprazole  (ABILIFY ) 15 MG tablet Take 1 tablet (15 mg total) by mouth daily. 30 tablet 2   azithromycin  (ZITHROMAX ) 250 MG tablet TAKE 1 TABLET BY MOUTH THREE TIMES A WEEK. 12 tablet 5   [START ON 04/06/2024] buPROPion  (WELLBUTRIN  XL) 300 MG 24 hr tablet Take 1 tablet (300 mg total) by mouth daily. 30 tablet 2   [START ON 04/04/2024] hydrOXYzine  (ATARAX ) 10 MG tablet Take 1 tablet (10 mg total) by mouth daily as needed. 30 tablet 2   montelukast  (SINGULAIR ) 10 MG tablet TAKE 1 TABLET BY MOUTH EVERY DAY (Patient not taking: Reported on 03/07/2024) 90 tablet 0   No current facility-administered medications  for this visit.    ROS: Review of Systems  All other systems reviewed and are negative.   Objective:  Objective: Psychiatric Specialty Exam: General Appearance: Casual, fairly groomed  Eye Contact:  Good    Speech:  Clear, coherent, normal rate, spontaneous  Volume:  Normal   Mood:  see above  Affect:  Appropriate, congruent, full range  Thought Content: Logical,  Suicidal Thoughts: see subjective  Thought Process:  Coherent, goal-directed  Orientation:  A&Ox4   Memory:  Immediate good  Judgment:  Fair   Insight:  Fair  Concentration:  Attention and concentration good   Recall:  Good  Fund of Knowledge: Good  Language: Good, fluent  Psychomotor Activity: Normal  Akathisia:  NA   AIMS (if indicated): NA   Assets:   Communication Skills Desire for Improvement Resilience Social Support  ADL's:  Intact  Cognition: WNL  Sleep: see above  Appetite: see above    Physical Exam Vitals reviewed.  Constitutional:      General: She is not in acute distress.    Appearance: She is not ill-appearing.  HENT:     Head: Normocephalic and atraumatic.  Eyes:     Extraocular Movements: Extraocular movements intact.     Conjunctiva/sclera: Conjunctivae normal.  Pulmonary:     Effort: Pulmonary effort is normal. No  respiratory distress.  Musculoskeletal:        General: Normal range of motion.  Skin:    General: Skin is dry.  Neurological:     General: No focal deficit present.     Comments: Very subtle, intermittent tremor observed in the left hand at rest, not present with purposeful movement. No rigidity, bradykinesia, or postural instability noted. No tremor elicited during finger-nose-finger or rapid alternating movements.      Metabolic Disorder Labs: No results found for: HGBA1C, MPG Lab Results  Component Value Date   PROLACTIN 13.1 06/09/2017   No results found for: CHOL, TRIG, HDL, CHOLHDL, VLDL, LDLCALC No results found for: TSH  Therapeutic Level Labs: No results found for: LITHIUM No results found for: VALPROATE No results found for: CBMZ  Screenings:  GAD-7    Flowsheet Row [facility] 03/07/2024 in [facility]  Total GAD-7 Score 0   PHQ2-9    Flowsheet Row Office Visit from 03/07/2024 in [facility] Office Visit from 01/29/2023 in [facility] Office Visit from 09[facility]11/2022 in [facility] Office Visit from 03/13/2022 in [facility] Office Visit from 11/14/2021 in [facility]  PHQ-2 Total Score 0 1 1 0 1   Flowsheet Row Office Visit from 01[facility]29/2023 in [facility] Office Visit from 09/11/2022 in [facility] Office Visit from 03/13/2022 in [facility]  C-SSRS RISK CATEGORY No Risk No Risk No Risk    Collaboration of Care:   Patient/Guardian was advised Release of Information must be obtained prior to any record release in order to collaborate their care with an outside provider. Patient/Guardian was advised  if they have not already done so to contact the registration department to sign all necessary forms in order for us  to release information regarding their care.   Consent: Patient/Guardian gives verbal consent for treatment and assignment of benefits for services provided during this visit. Patient/Guardian expressed understanding and agreed to proceed.    Marlo Masson, MD 03/07/2024, 5:45 PM

## 2024-03-05 ENCOUNTER — Other Ambulatory Visit: Payer: Self-pay | Admitting: Allergy and Immunology

## 2024-03-07 ENCOUNTER — Ambulatory Visit (HOSPITAL_COMMUNITY): Admitting: Student in an Organized Health Care Education/Training Program

## 2024-03-07 DIAGNOSIS — F40240 Claustrophobia: Secondary | ICD-10-CM

## 2024-03-07 DIAGNOSIS — G2111 Neuroleptic induced parkinsonism: Secondary | ICD-10-CM | POA: Diagnosis not present

## 2024-03-07 DIAGNOSIS — F952 Tourette's disorder: Secondary | ICD-10-CM | POA: Diagnosis not present

## 2024-03-07 DIAGNOSIS — Z79899 Other long term (current) drug therapy: Secondary | ICD-10-CM

## 2024-03-07 DIAGNOSIS — T43505A Adverse effect of unspecified antipsychotics and neuroleptics, initial encounter: Secondary | ICD-10-CM | POA: Diagnosis not present

## 2024-03-07 DIAGNOSIS — F3342 Major depressive disorder, recurrent, in full remission: Secondary | ICD-10-CM

## 2024-03-07 MED ORDER — HYDROXYZINE HCL 10 MG PO TABS: 10.0000 mg | ORAL_TABLET | Freq: Every day | ORAL | 2 refills | Status: DC | PRN

## 2024-03-07 MED ORDER — ARIPIPRAZOLE 15 MG PO TABS: 15.0000 mg | ORAL_TABLET | Freq: Every day | ORAL | 2 refills | Status: DC

## 2024-03-07 MED ORDER — BUPROPION HCL ER (XL) 300 MG PO TB24: 300.0000 mg | ORAL_TABLET | Freq: Every day | ORAL | 2 refills | Status: DC

## 2024-03-09 ENCOUNTER — Ambulatory Visit (HOSPITAL_COMMUNITY): Admitting: Student in an Organized Health Care Education/Training Program

## 2024-03-09 NOTE — Addendum Note (Signed)
 Addended by: CARVIN CROCK on: 03/09/2024 12:27 PM   Modules accepted: Level of Service

## 2024-03-20 ENCOUNTER — Other Ambulatory Visit: Payer: Self-pay | Admitting: Allergy and Immunology

## 2024-05-01 NOTE — Progress Notes (Unsigned)
 [facility] Progress Note  05/02/2024 4:52 PM Penny Porter  MRN:  [id number]  Assessment:  Penny Porter presents for follow-up evaluation in-person on 05/02/24 .   The patient  currently reports prolonged stability on her current psychiatric regimen. No acute safety concerns were identified. Mental status exam is stable and consistent with prior visits. No changes to medications are indicated at this time; will continue to monitor.  Identifying Information: Penny Porter is a 58 y.o. female with a documented history of Tourette's with long-term antipsychotic therapy, MDD/GAD in remission, claustrophobia, and SGA induced parkinsonism no suicide attempts or inpatient psych admission, who is an established patient with [facility] for management of mood.    The patient's PMHx is significant for asthma, DDD, GERD, HTN, premature atrial beats, OSA, stenosis of lumbosacral spine.   Plan:  # SGA-induced EPS - parkinsonism # Long-term antipsychotic use Past medication trials: cogentin , vistaril  Status of problem: stable Interventions: AIMS: 0 (01/11/2024) Last maintenance labs completed on 01/05/2024, will be repeated annually Continue Abilify  15 mg qAM    # Claustrophobia # MDD in remission # GAD in remission Past medication trials: lamictal, risperdal, brintelliz, clonidine, klonopin , vistaril , cogentin  Status of problem: ongoing Therapy: declined, continue to offer Continued home Wellbutrin  300 mg qAM  Continue home hydroxyzine  10 mg  daily PRN Abilify  per above   # H/O Tourettes d/o Past medication trials:  Status of problem: stable Abilify  per above    Health Maintenance PCP: Penny Porter    H/O OSA (no CPAP/jaw) - claustrophobic  Patient was given contact information for behavioral health clinic and was instructed to call 911 for emergencies.   Subjective:  Chief Complaint:  Chief Complaint  Patient presents with   Follow-up    Interval  History:  The patient reports that her sciatica symptoms have been exacerbated recently and she is currently taking prednisone. She denies any symptoms of depression or anxiety and describes engaging in arts and crafts, which she finds enjoyable and has been sharing with others. She also enjoys completing fill-in puzzles.  She reports ongoing tremors, noting that their severity fluctuates without a clear pattern. She does not feel that the tremors have worsened overall but describes them as a chronic issue. Although she has not kept a journal to track symptom fluctuations, she has been more attentive to identifying potential triggers and believes the episodes are random.  She denies any adverse effects from her medications and reports taking them as directed without missing doses. Sleep has been fairly pleasant, though she experiences some nighttime awakenings. Appetite remains good. She denies recent substance use and suicidal ideation.   Visit Diagnosis:    ICD-10-CM   1. Claustrophobia  F40.240 buPROPion  (WELLBUTRIN  XL) 300 MG 24 hr tablet    ARIPiprazole  (ABILIFY ) 15 MG tablet    hydrOXYzine  (ATARAX ) 10 MG tablet    2. MDD (major depressive disorder), recurrent, in full remission  F33.42 buPROPion  (WELLBUTRIN  XL) 300 MG 24 hr tablet    ARIPiprazole  (ABILIFY ) 15 MG tablet    3. Neuroleptic-induced parkinsonism  G21.11 ARIPiprazole  (ABILIFY ) 15 MG tablet   T43.505A hydrOXYzine  (ATARAX ) 10 MG tablet    4. Tourette's disease  F95.2 ARIPiprazole  (ABILIFY ) 15 MG tablet    hydrOXYzine  (ATARAX ) 10 MG tablet    5. Long term current use of antipsychotic medication  Z79.899 ARIPiprazole  (ABILIFY ) 15 MG tablet    hydrOXYzine  (ATARAX ) 10 MG tablet       Past Psychiatric History:  Diagnoses: MDD, GAD, Tourette's disease (See [facility] neurology clinic note from 10/05/2013 for detailed eval), Neuroleptic-induced parkinsonism (abilify ) Medication trials:  Lamictal (allergic rxn - ), risperdal (side  effects, leg weakness), brintellix (side effects, GI), clonidine (ineffective), pimozide  (weight gain, dc to try another rx to help with tics and depression) Clonazepam  (PRN 2015-2016), vistaril , cogentin  (side effects constipation, urinary hesitancy) Current: Abilify  (2015-current, side effects of parkinsonism at >=20 mg), wellbutrin  (450 mg caused HTN, at 300 mg since 2015-current) Previous psychiatrist/therapist:  Johnie Cahill, [facility] (2015-2024), [facility] (2024-current) Hospitalizations: Denied  Suicide attempts: Denied  SIB: Denied Hx of violence towards others: Denied Current access to guns: Denied Trauma/abuse: Denied   Substance Use History: EtOH:  reports no history of alcohol use. Nicotine:  reports that she has never smoked. She has never used smokeless tobacco. Marijuana: Denied IV drug use: Denied Stimulants: Denied Opiates: Denied Sedative/hypnotics: Denied Hallucinogens: Denied DT: Denied Detox: Denied Residential: Denied    Past Medical History: Dx:  has a past medical history of Anxiety, Arthritis, Asthma, Bursitis, Complication of anesthesia, Costochondral chest pain, Degenerative disk disease, Depression, Gastroparesis, Generalized anxiety disorder (02/11/2016), GERD (gastroesophageal reflux disease), Hypertension, Long term current use of antipsychotic medication (10/28/2023), Lumbar herniated disc, MDD (major depressive disorder), recurrent, in full remission (HCC) (07/11/2014), Premature atrial beats (12/2019), Sleep apnea, Stenosis of lumbosacral spine, Stress fracture of foot, Thyroid  nodule, Tourette disease, and Trigeminal nerve disease.  Head trauma: Denied Seizures: Denied Allergies: Amoxicillin, Avelox [moxifloxacin hcl in nacl], Adhesive [tape], Bextra [valdecoxib], Doxycycline, Doxycycline hyclate, Gadobutrol, Gadolinium derivatives, Gadoversetamide, Iodinated contrast media, Lamictal [lamotrigine], Naproxen, Oxycodone -acetaminophen , Percolone  [oxycodone ], Relafen [nabumetone], Risperdal [risperidone], Nitrofurantoin, Brintellix [vortioxetine], Fetzima [levomilnacipran], Moxifloxacin, and Sulfamethoxazole-trimethoprim    Family Psychiatric History:  Denied psychiatric family history Others: sisterwith IDD, dad with dementia   Social History: Current Place of Residence: [place] of Birth: [place] Family Members: living with 2 sisters Brother deceased - 2021-05-29 Mom deceased - May 29, 2017 Dad decreased - 2014 Has adult nibling and younger grand-niblings Marital Status:  Single, never married Children: denies Relationships: denies Education:  Designer, television/film set, CNA Religious Beliefs/Practices: Goes to church, if physically unable, she will watch on TV History of Abuse: none 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 CNA Currently unemployed, disabilities since May 29, 2014 Legal History: denies Hobbies/Interests: reading, coloring, TV  Social History   Socioeconomic History   Marital status: Single    Spouse name: Not on file   Number of children: Not on file   Years of education: Not on file   Highest education level: High school graduate  Occupational History    Comment: disabled   Tobacco Use   Smoking status: Never   Smokeless tobacco: Never  Vaping Use   Vaping status: Never Used  Substance and Sexual Activity   Alcohol use: No   Drug use: No   Sexual activity: Not Currently  Other Topics Concern   Not on file  Social History Narrative   Lives with 2 sisters   Caffeine- none   Social Drivers of Corporate investment banker Strain: Not on file  Food Insecurity: Low Risk  (01/05/2024)   Received from [facility]   Hunger Vital Sign    Within the past 12 months, you worried that your food would run out before you got money to buy more: Never true    Within the past 12  months, the food you bought just didn't last  and you didn't have money to get more. : Never true  Transportation Needs: No Transportation Needs (01/05/2024)   Received from [facility]    In the past 12 months, has lack of reliable transportation kept you from medical appointments, meetings, work or from getting things needed for daily living? : No  Physical Activity: Not on file  Stress: Not on file  Social Connections: Not on file    Allergies:  Allergies  Allergen Reactions   Amoxicillin Hives and Swelling    LIP SWELLING PATIENT HAS HAD A PCN REACTION WITH IMMEDIATE RASH, FACIAL/TONGUE/THROAT SWELLING, SOB, OR LIGHTHEADEDNESS WITH HYPOTENSION:  #  #  #  YES  #  #  #   HAS PT DEVELOPED SEVERE RASH INVOLVING MUCUS MEMBRANES or SKIN NECROSIS: #  #  #  YES  #  #  #  Has patient had a PCN reaction that required hospitalization: No Has patient had a PCN reaction occurring within the last 10 years: Unknown If all of the above answers are NO, then may proceed with Cephalosporin use.    Avelox [Moxifloxacin Hcl In Nacl] Other (See Comments)    Caused pain in shoulders to finger, tendonitis   Adhesive [Tape] Dermatitis    Blisters skin   Bextra [Valdecoxib] Hives and Swelling   Doxycycline Other (See Comments)   Doxycycline Hyclate Hives and Swelling   Gadobutrol Hives   Gadolinium Derivatives Hives   Gadoversetamide Hives   Iodinated Contrast Media Hives   Lamictal [Lamotrigine] Hives   Naproxen Hypertension   Oxycodone -Acetaminophen  Other (See Comments)   Percolone [Oxycodone ] Hives   Relafen [Nabumetone] Hypertension   Risperdal [Risperidone] Other (See Comments)    Leg weakness    Nitrofurantoin Other (See Comments)   Brintellix [Vortioxetine] Nausea And Vomiting   Fetzima [Levomilnacipran] Other (See Comments)    Tremors across head    Moxifloxacin Other (See Comments)    Caused pain in shoulders to finger, tendonitis   Sulfamethoxazole-Trimethoprim Rash    Flushing and rash    Current  Medications: Current Outpatient Medications  Medication Sig Dispense Refill   Acetaminophen  (TYLENOL  PO) Take 1 tablet by mouth daily as needed (PAIN).     albuterol  (PROVENTIL ) (2.5 MG/3ML) 0.083% nebulizer solution CAN USE ONE VIAL IN NEBULIZER EVERY FOUR TO SIX HOURS AS NEEDED FOR COUGH OR WHEEZE. 150 mL 1   albuterol  (VENTOLIN  HFA) 108 (90 Base) MCG/ACT inhaler INHALE 2 PUFFS BY MOUTH EVERY 4 HOURS AS NEEDED FOR WHEEZE OR FOR SHORTNESS OF BREATH 18 each 2   allopurinol (ZYLOPRIM) 100 MG tablet Take by mouth 2 (two) times daily.      [START ON 06/29/2024] ARIPiprazole  (ABILIFY ) 15 MG tablet Take 1 tablet (15 mg total) by mouth daily. 90 tablet 0   aspirin  81 MG tablet Take 81 mg by mouth daily.     azithromycin  (ZITHROMAX ) 250 MG tablet TAKE 1 TABLET BY MOUTH THREE TIMES A WEEK. 12 tablet 5   [START ON 06/29/2024] buPROPion  (WELLBUTRIN  XL) 300 MG 24 hr tablet Take 1 tablet (300 mg total) by mouth daily. 90 tablet 0   Cholecalciferol (VITAMIN D -3) 5000 UNITS TABS Take 5,000 Units by mouth daily.      clotrimazole-betamethasone  (LOTRISONE) cream SMARTSIG:sparingly Topical Twice Daily     famotidine  (PEPCID ) 20 MG tablet TAKE 1 TABLET BY MOUTH TWICE A DAY 180 tablet 1   flecainide (TAMBOCOR) 50 MG tablet SMARTSIG:1 Tablet(s) By  Mouth Every 12 Hours     furosemide  (LASIX ) 20 MG tablet Take 20 mg by mouth daily.     [START ON 06/29/2024] hydrOXYzine  (ATARAX ) 10 MG tablet Take 1 tablet (10 mg total) by mouth daily as needed. 90 tablet 0   ipratropium (ATROVENT) 0.03 % nasal spray USE 2 SPRAYS IN NOSTRIL (S) 2 TIMES DAILY AS DIRECTED  11   ketoconazole  (NIZORAL ) 2 % shampoo Apply 1 application topically as needed for irritation.      loratadine  (CLARITIN ) 10 MG tablet Take 10 mg by mouth at bedtime.      losartan (COZAAR) 50 MG tablet Take 50 mg by mouth daily.     lubiprostone  (AMITIZA ) 24 MCG capsule Take 24 mcg by mouth daily as needed for constipation.      meclizine (ANTIVERT) 25 MG tablet  PLEASE SEE ATTACHED FOR DETAILED DIRECTIONS     meloxicam  (MOBIC ) 7.5 MG tablet Take 7.5 mg by mouth 2 (two) times daily.  0   methocarbamol (ROBAXIN) 500 MG tablet Take 500 mg by mouth 4 (four) times daily. (Patient taking differently: Take 500 mg by mouth 4 (four) times daily.)     metoprolol succinate (TOPROL-XL) 25 MG 24 hr tablet Take by mouth 2 (two) times daily.     Mometasone  Furoate (ASMANEX  HFA) 200 MCG/ACT AERO Inhale two puffs once daily as directed.  Rinse, gargle, and spit after use. 39 g 1   montelukast  (SINGULAIR ) 10 MG tablet TAKE 1 TABLET BY MOUTH EVERY DAY 90 tablet 1   mupirocin  ointment (BACTROBAN ) 2 % Place 1 application into the nose 2 (two) times daily as needed (IRRITATION).      ondansetron  (ZOFRAN -ODT) 4 MG disintegrating tablet Take 4 mg by mouth 2 (two) times daily as needed for nausea or vomiting.     pregabalin  (LYRICA ) 75 MG capsule Take 75 mg by mouth 2 (two) times daily. Reported on 10/09/2015     Spacer/Aero-Holding Chambers (AEROCHAMBER PLUS) inhaler Use as instructed with inhaler. 1 each 2   spironolactone  (ALDACTONE ) 25 MG tablet Take 25 mg by mouth daily.     Tiotropium Bromide Monohydrate  (SPIRIVA  RESPIMAT) 1.25 MCG/ACT AERS Inhale 2 puffs into the lungs daily. 12 g 1   traMADol  (ULTRAM ) 50 MG tablet Take 50 mg by mouth.     triamcinolone  cream (KENALOG ) 0.1 % Apply 1 application topically 2 (two) times daily as needed (IRRITATION).      vitamin B-12 (CYANOCOBALAMIN ) 1000 MCG tablet Take 1,000 mcg by mouth daily.     No current facility-administered medications for this visit.    ROS: Review of Systems  All other systems reviewed and are negative.   Objective:  Objective: Psychiatric Specialty Exam: General Appearance: Casual, fairly groomed  Eye Contact:  Good    Speech:  Clear, coherent, normal rate, spontaneous  Volume:  Normal   Mood:  see above  Affect:  Appropriate, congruent, full range  Thought Content: Logical,  Suicidal Thoughts: see  subjective  Thought Process:  Coherent, goal-directed  Orientation:  A&Ox4   Memory:  Immediate good  Judgment:  Fair   Insight:  Fair  Concentration:  Attention and concentration good   Recall:  Good  Fund of Knowledge: Good  Language: Good, fluent  Psychomotor Activity: Normal  Akathisia:  NA   AIMS (if indicated): NA   Assets:   Communication Skills Desire for Improvement Resilience Social Support  ADL's:  Intact  Cognition: WNL  Sleep: see above  Appetite: see above  Physical Exam Vitals reviewed.  Constitutional:      General: She is not in acute distress.    Appearance: She is not ill-appearing.  HENT:     Head: Normocephalic and atraumatic.  Eyes:     Extraocular Movements: Extraocular movements intact.     Conjunctiva/sclera: Conjunctivae normal.  Pulmonary:     Effort: Pulmonary effort is normal. No respiratory distress.  Musculoskeletal:        General: Normal range of motion.  Skin:    General: Skin is dry.  Neurological:     General: No focal deficit present.     Comments: Very subtle, intermittent tremor observed in the left hand at rest, not present with purposeful movement. No rigidity, bradykinesia, or postural instability noted. No tremor elicited during finger-nose-finger or rapid alternating movements.      Metabolic Disorder Labs: No results found for: HGBA1C, MPG Lab Results  Component Value Date   PROLACTIN 13.1 06/09/2017   No results found for: CHOL, TRIG, HDL, CHOLHDL, VLDL, LDLCALC No results found for: TSH  Therapeutic Level Labs: No results found for: LITHIUM No results found for: VALPROATE No results found for: CBMZ  Screenings:  GAD-7    Flowsheet Row [facility]02/20[facility] in [facility] Office Visit from 03/07/2024 in [facility]  Total GAD-7 Score 0 0   PHQ2-9    Flowsheet Row Office Visit from 05/02/2024 in  [facility] Office Visit from 03/07/2024 in [facility] Office Visit from 01/29/2023 in [facility] Office Visit from 09[facility]11/2022 in [facility] Office Visit from 03[facility]13/2022 in [facility]  PHQ-2 Total Score 0 0 1 1 0   Flowsheet Row Office Visit from 01/29/2023 in [facility] Office Visit from 09/11/2022 in [facility] Office Visit from 03/13/2022 in [facility]  C-SSRS RISK CATEGORY No Risk No Risk No Risk   Collaboration of Care:  Patient/Guardian was advised Release of Information must be obtained prior to any record release in order to collaborate their care with an outside provider. Patient/Guardian was advised if they have not already done so to contact the registration department to sign all necessary forms in order for us  to release information regarding their care.   Consent: Patient/Guardian gives verbal consent for treatment and assignment of benefits for services provided during this visit. Patient/Guardian expressed understanding and agreed to proceed.    Marlo Masson, [facility] 05/02/2024, 4:52 PM

## 2024-05-02 ENCOUNTER — Ambulatory Visit (HOSPITAL_COMMUNITY): Admitting: Student in an Organized Health Care Education/Training Program

## 2024-05-02 ENCOUNTER — Encounter (HOSPITAL_COMMUNITY): Payer: Self-pay | Admitting: Student in an Organized Health Care Education/Training Program

## 2024-05-02 DIAGNOSIS — Z79899 Other long term (current) drug therapy: Secondary | ICD-10-CM

## 2024-05-02 DIAGNOSIS — F3342 Major depressive disorder, recurrent, in full remission: Secondary | ICD-10-CM

## 2024-05-02 DIAGNOSIS — F952 Tourette's disorder: Secondary | ICD-10-CM | POA: Diagnosis not present

## 2024-05-02 DIAGNOSIS — G2111 Neuroleptic induced parkinsonism: Secondary | ICD-10-CM

## 2024-05-02 DIAGNOSIS — F40240 Claustrophobia: Secondary | ICD-10-CM

## 2024-05-02 DIAGNOSIS — T43505A Adverse effect of unspecified antipsychotics and neuroleptics, initial encounter: Secondary | ICD-10-CM

## 2024-05-02 MED ORDER — ARIPIPRAZOLE 15 MG PO TABS: 15.0000 mg | ORAL_TABLET | Freq: Every day | ORAL | 0 refills | Status: DC

## 2024-05-02 MED ORDER — HYDROXYZINE HCL 10 MG PO TABS: 10.0000 mg | ORAL_TABLET | Freq: Every day | ORAL | 0 refills | Status: DC | PRN

## 2024-05-02 MED ORDER — BUPROPION HCL ER (XL) 300 MG PO TB24: 300.0000 mg | ORAL_TABLET | Freq: Every day | ORAL | 0 refills | Status: DC

## 2024-05-05 NOTE — Addendum Note (Signed)
 Addended by: CARVIN CROCK on: 05/05/2024 10:21 AM   Modules accepted: Level of Service

## 2024-05-17 ENCOUNTER — Ambulatory Visit: Admitting: Podiatry

## 2024-06-02 ENCOUNTER — Ambulatory Visit: Admitting: Podiatry

## 2024-06-05 ENCOUNTER — Other Ambulatory Visit: Payer: Self-pay | Admitting: Allergy and Immunology

## 2024-06-09 ENCOUNTER — Ambulatory Visit: Admitting: Podiatry

## 2024-06-09 ENCOUNTER — Encounter: Payer: Self-pay | Admitting: Podiatry

## 2024-06-09 DIAGNOSIS — M79676 Pain in unspecified toe(s): Secondary | ICD-10-CM | POA: Diagnosis not present

## 2024-06-09 DIAGNOSIS — B351 Tinea unguium: Secondary | ICD-10-CM | POA: Diagnosis not present

## 2024-06-10 NOTE — Progress Notes (Signed)
 She presents today chief complaint of painful elongated toenails.  Objective: Vital signs are stable she is alert and oriented x 3.  Pulses are palpable.  There is no erythema edema cellulitis drainage or odor toenails are long thick yellow dystrophic like mycotic.  Assessment: Pain in limb secondary to onychomycosis diabetic peripheral neuropathy.  Plan: Debridement of toenails 1 through 5 bilateral follow-up in 3 months

## 2024-07-20 ENCOUNTER — Ambulatory Visit (HOSPITAL_COMMUNITY): Admitting: Student in an Organized Health Care Education/Training Program

## 2024-07-20 ENCOUNTER — Encounter (HOSPITAL_COMMUNITY): Payer: Self-pay | Admitting: Student in an Organized Health Care Education/Training Program

## 2024-07-20 VITALS — BP 128/88 | HR 76

## 2024-07-20 DIAGNOSIS — F40240 Claustrophobia: Secondary | ICD-10-CM | POA: Diagnosis not present

## 2024-07-20 DIAGNOSIS — T43505A Adverse effect of unspecified antipsychotics and neuroleptics, initial encounter: Secondary | ICD-10-CM | POA: Diagnosis not present

## 2024-07-20 DIAGNOSIS — G2401 Drug induced subacute dyskinesia: Secondary | ICD-10-CM | POA: Diagnosis not present

## 2024-07-20 DIAGNOSIS — F952 Tourette's disorder: Secondary | ICD-10-CM

## 2024-07-20 DIAGNOSIS — F3342 Major depressive disorder, recurrent, in full remission: Secondary | ICD-10-CM | POA: Diagnosis not present

## 2024-07-20 DIAGNOSIS — Z79899 Other long term (current) drug therapy: Secondary | ICD-10-CM

## 2024-07-20 DIAGNOSIS — G2111 Neuroleptic induced parkinsonism: Secondary | ICD-10-CM

## 2024-07-20 MED ORDER — HYDROXYZINE HCL 10 MG PO TABS: 10.0000 mg | ORAL_TABLET | Freq: Every day | ORAL | 0 refills | Status: AC | PRN

## 2024-07-20 MED ORDER — BUPROPION HCL ER (XL) 300 MG PO TB24: 300.0000 mg | ORAL_TABLET | Freq: Every day | ORAL | 0 refills | Status: AC

## 2024-07-20 MED ORDER — ARIPIPRAZOLE 10 MG PO TABS: 10.0000 mg | ORAL_TABLET | Freq: Every day | ORAL | 0 refills | Status: AC

## 2024-07-20 MED ORDER — AUSTEDO XR 6 MG PO TB24: 6.0000 mg | ORAL_TABLET | Freq: Every evening | ORAL | 2 refills | Status: AC

## 2024-07-20 NOTE — Progress Notes (Signed)
 BH MD Outpatient Progress Note  07/20/2024 3:32 PM Penny [place]  MRN:  [id number]  Assessment:  Penny [place] presents for follow-up evaluation on 07/20/24 .   At todays follow-up, the patient reports stable mood symptoms.  On physical exam her tremors appear worse compared to the prior visit.  She is also noted to have repetitive tongue protrusion and repetitive jaw swinging, which the patient does not find distressing.  Repeat AIMS score is 6, consistent with new onset of tardive dyskinesia TD after prolonged exposure to Abilify , which had been used for Tourettes management for over a decade. At the last visit, these symptoms were not observed.  Given the emergence of TD and the patients history of intolerance to Ingrezza  due to sedation and hypotension, Austedo XR is an appropriate alternative VMAT2 inhibitor for TD management.  We will begin an Abilify  taper, with a plan to decrease by 5 mg every two weeks until discontinued, in order to mitigate further risk of TD while monitoring for any exacerbation of tics or mood symptoms.  Identifying Information: Penny [place] is a 58 y.o. female with a documented history of Tourette's with long-term antipsychotic therapy, MDD/GAD in remission, claustrophobia, and SGA induced parkinsonism no suicide attempts or inpatient psych admission, who is an established patient with [facility] for management of mood.    The patient's PMHx is significant for asthma, DDD, GERD, HTN, premature atrial beats, OSA, stenosis of lumbosacral spine.   The patient is wheelchair dependent due to impaired mobility and ADLs.  Plan:  #Tardive dyskinesia # Long-term antipsychotic use Past medication trials: Ingrezza  (stopped due to AEs) Status of problem: New, diagnosed on 07/20/2024 AIMS: 0 (01/11/2024) > 6 (07/20/2024) Start Austedo XR 6 mg nightly Patient instructed to taper off Abilify  50 mg daily to 10 mg daily for 2 weeks, then decrease to 5 mg daily  for 2 weeks and STOP   # Claustrophobia # MDD in remission # GAD in remission Past medication trials: lamictal, risperdal, brintelliz, clonidine, klonopin , vistaril , cogentin  Status of problem: ongoing Therapy: declined, continue to offer Continued home Wellbutrin  300 mg qAM  Continue home hydroxyzine  10 mg  daily PRN  # H/O Tourettes d/o Past medication trials:  Status of problem: stable Austedo per above Tapering off Abilify  as above    Health Maintenance PCP: Thurmond Cathlyn LABOR., MD    H/O OSA (no CPAP/jaw) - claustrophobic  Patient was given contact information for behavioral health clinic and was instructed to call 911 for emergencies.   Subjective:  Chief Complaint:  Chief Complaint  Patient presents with   Follow-up    Interval History: The patient reports ongoing back pain, which is contributing to some distress, but otherwise describes her mood as stable. She feels both depression and anxiety are well controlled and expresses satisfaction with her current psychotropic regimen. Tremors persist and is unsure if they have worsened or unchanged from previous visits; she continues to find coloring helpful in managing these symptoms and notes they are less pronounced when she is engaged in this activity.  She has noted involuntary movements of her jaw and tongue that are recent.  She denies adverse effects from medications and reports full compliance with her prescribed regimen. Sleep is described as good, with a typical bedtime between 10:30-11:30 PM and waking around 7 AM, averaging 7-8 hours nightly. She is unsure about snoring and is not using CPAP. Appetite remains good. She denies recent use of alcohol, tobacco, cannabis, or other substances. No suicidal  or homicidal ideation, and no auditory or visual hallucinations.     Visit Diagnosis:    ICD-10-CM   1. Tardive dyskinesia  G24.01 Deutetrabenazine ER (AUSTEDO XR) 6 MG TB24    2. Claustrophobia  F40.240 buPROPion   (WELLBUTRIN  XL) 300 MG 24 hr tablet    hydrOXYzine  (ATARAX ) 10 MG tablet    3. MDD (major depressive disorder), recurrent, in full remission  F33.42 buPROPion  (WELLBUTRIN  XL) 300 MG 24 hr tablet    4. Neuroleptic-induced parkinsonism  G21.11 hydrOXYzine  (ATARAX ) 10 MG tablet   T43.505A     5. Tourette's disease  F95.2 hydrOXYzine  (ATARAX ) 10 MG tablet    ARIPiprazole  (ABILIFY ) 10 MG tablet    6. Long term current use of antipsychotic medication  Z79.899 Deutetrabenazine ER (AUSTEDO XR) 6 MG TB24    hydrOXYzine  (ATARAX ) 10 MG tablet        Past Psychiatric History:  Diagnoses: MDD, GAD, Tourette's disease (See [facility] neurology clinic note from 10/05/2013 for detailed eval), Neuroleptic-induced parkinsonism (abilify ) Medication trials:  Lamictal (allergic rxn - ), risperdal (side effects, leg weakness), brintellix (side effects, GI), clonidine (ineffective), pimozide  (weight gain, dc to try another rx to help with tics and depression) Clonazepam  (PRN 2015-2016), vistaril , cogentin  (side effects constipation, urinary hesitancy) Current: Abilify  (2015-current, side effects of parkinsonism at >=20 mg), wellbutrin  (450 mg caused HTN, at 300 mg since 2015-current) Previous psychiatrist/therapist:  Johnie Cahill, MD (2015-2024), [facility] (2024-current) Hospitalizations: Denied  Suicide attempts: Denied  SIB: Denied Hx of violence towards others: Denied Current access to guns: Denied Trauma/abuse: Denied   Substance Use History: EtOH:  reports no history of alcohol use. Nicotine:  reports that she has never smoked. She has never used smokeless tobacco. Marijuana: Denied IV drug use: Denied Stimulants: Denied Opiates: Denied Sedative/hypnotics: Denied Hallucinogens: Denied DT: Denied Detox: Denied Residential: Denied    Past Medical History: Dx:  has a past medical history of Anxiety, Arthritis, Asthma, Bursitis, Complication of anesthesia, Costochondral chest pain,  Degenerative disk disease, Depression, Gastroparesis, Generalized anxiety disorder (02/11/2016), GERD (gastroesophageal reflux disease), Hypertension, Long term current use of antipsychotic medication (10/28/2023), Lumbar herniated disc, MDD (major depressive disorder), recurrent, in full remission (HCC) (07/11/2014), Premature atrial beats (12/2019), Sleep apnea, Stenosis of lumbosacral spine, Stress fracture of foot, Thyroid  nodule, Tourette disease, and Trigeminal nerve disease.  Head trauma: Denied Seizures: Denied Allergies: Amoxicillin, Avelox [moxifloxacin hcl in nacl], Adhesive [tape], Bextra [valdecoxib], Doxycycline, Doxycycline hyclate, Gadobutrol, Gadolinium derivatives, Gadoversetamide, Iodinated contrast media, Lamictal [lamotrigine], Naproxen, Oxycodone -acetaminophen , Percolone [oxycodone ], Relafen [nabumetone], Risperdal [risperidone], Nitrofurantoin, Brintellix [vortioxetine], Fetzima [levomilnacipran], Moxifloxacin, and Sulfamethoxazole-trimethoprim    Family Psychiatric History:  Denied psychiatric family history Others: sisterwith IDD, dad with dementia   Social History: Current Place of Residence: [place] of Birth: [place] Family Members: living with 2 sisters Brother deceased - Aug 18, 2021 Mom deceased - 08/18/17 Dad decreased - 2014 Has adult nibling and younger grand-niblings Marital Status:  Single, never married Children: denies Relationships: denies Education:  Designer, Television/film Set, CNA Religious Beliefs/Practices: Goes to church, if physically unable, she will watch on TV History of Abuse: none 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, disabilities since Aug 18, 2014 Legal History: denies Hobbies/Interests: reading, coloring, TV  Social History   Socioeconomic History   Marital status: Single    Spouse name: Not on file   Number of children:  Not on  file   Years of education: Not on file   Highest education level: High school graduate  Occupational History    Comment: disabled   Tobacco Use   Smoking status: Never   Smokeless tobacco: Never  Vaping Use   Vaping status: Never Used  Substance and Sexual Activity   Alcohol use: No   Drug use: No   Sexual activity: Not Currently  Other Topics Concern   Not on file  Social History Narrative   Lives with 2 sisters   Caffeine- none   Social Drivers of Health   Financial Resource Strain: Not on file  Food Insecurity: Low Risk (01/05/2024)   Received from [facility]   Hunger Vital Sign    Within the past 12 months, you worried that your food would run out before you got money to buy more: Never true    Within the past 12 months, the food you bought just didn't last and you didn't have money to get more. : Never true  Transportation Needs: No Transportation Needs (01/05/2024)   Received from [facility]    In the past 12 months, has lack of reliable transportation kept you from medical appointments, meetings, work or from getting things needed for daily living? : No  Physical Activity: Not on file  Stress: Not on file  Social Connections: Not on file    Allergies:  Allergies  Allergen Reactions   Amoxicillin Hives and Swelling    LIP SWELLING PATIENT HAS HAD A PCN REACTION WITH IMMEDIATE RASH, FACIAL/TONGUE/THROAT SWELLING, SOB, OR LIGHTHEADEDNESS WITH HYPOTENSION:  #  #  #  YES  #  #  #   HAS PT DEVELOPED SEVERE RASH INVOLVING MUCUS MEMBRANES or SKIN NECROSIS: #  #  #  YES  #  #  #  Has patient had a PCN reaction that required hospitalization: No Has patient had a PCN reaction occurring within the last 10 years: Unknown If all of the above answers are NO, then may proceed with Cephalosporin use.    Avelox [Moxifloxacin Hcl In Nacl] Other (See Comments)    Caused pain in shoulders to finger, tendonitis   Adhesive [Tape] Dermatitis    Blisters  skin   Bextra [Valdecoxib] Hives and Swelling   Doxycycline Other (See Comments)   Doxycycline Hyclate Hives and Swelling   Gadobutrol Hives   Gadolinium Derivatives Hives   Gadoversetamide Hives   Iodinated Contrast Media Hives   Lamictal [Lamotrigine] Hives   Naproxen Hypertension   Oxycodone -Acetaminophen  Other (See Comments)   Percolone [Oxycodone ] Hives   Relafen [Nabumetone] Hypertension   Risperdal [Risperidone] Other (See Comments)    Leg weakness    Nitrofurantoin Other (See Comments)   Brintellix [Vortioxetine] Nausea And Vomiting   Fetzima [Levomilnacipran] Other (See Comments)    Tremors across head    Moxifloxacin Other (See Comments)    Caused pain in shoulders to finger, tendonitis   Sulfamethoxazole-Trimethoprim Rash    Flushing and rash    Current Medications: Current Outpatient Medications  Medication Sig Dispense Refill   ARIPiprazole  (ABILIFY ) 10 MG tablet Take 1 tablet (10 mg total) by mouth daily. Take 1 tablet (10 mg) by mouth daily for 14 days, then decrease to half a tablet (5 mg) for 2 weeks, then stop 21 tablet 0   Deutetrabenazine ER (AUSTEDO XR) 6 MG TB24 Take 6 mg by mouth at bedtime. 30 tablet 2   Acetaminophen  (TYLENOL  PO) Take 1 tablet by mouth daily as  needed (PAIN).     albuterol  (PROVENTIL ) (2.5 MG/3ML) 0.083% nebulizer solution CAN USE ONE VIAL IN NEBULIZER EVERY FOUR TO SIX HOURS AS NEEDED FOR COUGH OR WHEEZE. 150 mL 1   albuterol  (VENTOLIN  HFA) 108 (90 Base) MCG/ACT inhaler INHALE 2 PUFFS BY MOUTH EVERY 4 HOURS AS NEEDED FOR WHEEZE OR FOR SHORTNESS OF BREATH 18 each 2   allopurinol (ZYLOPRIM) 100 MG tablet Take by mouth 2 (two) times daily.      aspirin  81 MG tablet Take 81 mg by mouth daily.     azithromycin  (ZITHROMAX ) 250 MG tablet TAKE 1 TABLET BY MOUTH THREE TIMES A WEEK. 12 tablet 5   buPROPion  (WELLBUTRIN  XL) 300 MG 24 hr tablet Take 1 tablet (300 mg total) by mouth daily. 90 tablet 0   Cholecalciferol (VITAMIN D -3) 5000 UNITS TABS  Take 5,000 Units by mouth daily.      clotrimazole-betamethasone  (LOTRISONE) cream SMARTSIG:sparingly Topical Twice Daily     famotidine  (PEPCID ) 20 MG tablet TAKE 1 TABLET BY MOUTH TWICE A DAY 180 tablet 1   flecainide (TAMBOCOR) 50 MG tablet SMARTSIG:1 Tablet(s) By Mouth Every 12 Hours     furosemide  (LASIX ) 20 MG tablet Take 20 mg by mouth daily.     hydrOXYzine  (ATARAX ) 10 MG tablet Take 1 tablet (10 mg total) by mouth daily as needed. 90 tablet 0   ipratropium (ATROVENT) 0.03 % nasal spray USE 2 SPRAYS IN NOSTRIL (S) 2 TIMES DAILY AS DIRECTED  11   ketoconazole  (NIZORAL ) 2 % shampoo Apply 1 application topically as needed for irritation.      loratadine  (CLARITIN ) 10 MG tablet Take 10 mg by mouth at bedtime.      losartan (COZAAR) 50 MG tablet Take 50 mg by mouth daily.     lubiprostone  (AMITIZA ) 24 MCG capsule Take 24 mcg by mouth daily as needed for constipation.      meclizine (ANTIVERT) 25 MG tablet PLEASE SEE ATTACHED FOR DETAILED DIRECTIONS     meloxicam  (MOBIC ) 7.5 MG tablet Take 7.5 mg by mouth 2 (two) times daily.  0   methocarbamol (ROBAXIN) 500 MG tablet Take 500 mg by mouth 4 (four) times daily. (Patient taking differently: Take 500 mg by mouth 4 (four) times daily.)     metoprolol succinate (TOPROL-XL) 25 MG 24 hr tablet Take by mouth 2 (two) times daily.     Mometasone  Furoate (ASMANEX  HFA) 200 MCG/ACT AERO Inhale two puffs once daily as directed.  Rinse, gargle, and spit after use. 39 g 1   montelukast  (SINGULAIR ) 10 MG tablet TAKE 1 TABLET BY MOUTH EVERY DAY 90 tablet 1   mupirocin  ointment (BACTROBAN ) 2 % Place 1 application into the nose 2 (two) times daily as needed (IRRITATION).      ondansetron  (ZOFRAN -ODT) 4 MG disintegrating tablet Take 4 mg by mouth 2 (two) times daily as needed for nausea or vomiting.     pregabalin  (LYRICA ) 75 MG capsule Take 75 mg by mouth 2 (two) times daily. Reported on 10/09/2015     Spacer/Aero-Holding Chambers (AEROCHAMBER PLUS) inhaler Use as  instructed with inhaler. 1 each 2   spironolactone  (ALDACTONE ) 25 MG tablet Take 25 mg by mouth daily.     Tiotropium Bromide Monohydrate  (SPIRIVA  RESPIMAT) 1.25 MCG/ACT AERS Inhale 2 puffs into the lungs daily. 12 g 1   traMADol  (ULTRAM ) 50 MG tablet Take 50 mg by mouth.     triamcinolone  cream (KENALOG ) 0.1 % Apply 1 application topically 2 (two) times daily as  needed (IRRITATION).      vitamin B-12 (CYANOCOBALAMIN ) 1000 MCG tablet Take 1,000 mcg by mouth daily.     No current facility-administered medications for this visit.    ROS: Review of Systems  All other systems reviewed and are negative.   Objective:  Objective: Psychiatric Specialty Exam: General Appearance: Casual, fairly groomed ; seated in wheelchair  Eye Contact:  Good    Speech:  Clear, coherent, normal rate, spontaneous  Volume:  Normal   Mood:  see above  Affect:  Appropriate, congruent, full range  Thought Content: Logical   Suicidal Thoughts: see subjective  Thought Process:  Coherent, goal-directed  Orientation:  A&Ox4   Memory:  Immediate good  Judgment:  Fair   Insight:  Fair   Concentration:  Attention and concentration good   Recall:  Good  Fund of Knowledge: Good  Language: Good, fluent  Psychomotor Activity: Normal  Akathisia:  NA   AIMS (if indicated): NA   Assets:   Communication Skills Desire for Improvement Resilience Social Support  ADL's:  Intact  Cognition: WNL  Sleep: see above  Appetite: see above    Physical Exam Vitals reviewed.  Constitutional:      General: She is not in acute distress.    Appearance: She is not ill-appearing.  HENT:     Head: Normocephalic and atraumatic.  Eyes:     Extraocular Movements: Extraocular movements intact.     Conjunctiva/sclera: Conjunctivae normal.  Pulmonary:     Effort: Pulmonary effort is normal. No respiratory distress.  Musculoskeletal:        General: Normal range of motion.  Skin:    General: Skin is dry.  Neurological:      General: No focal deficit present.     Comments: Very subtle, intermittent tremor observed in the left hand at rest, not present with purposeful movement. No rigidity, bradykinesia, or postural instability noted. No tremor elicited during finger-nose-finger or rapid alternating movements.      Metabolic Disorder Labs: No results found for: HGBA1C, MPG Lab Results  Component Value Date   PROLACTIN 13.1 06/09/2017   No results found for: CHOL, TRIG, HDL, CHOLHDL, VLDL, LDLCALC No results found for: TSH  Therapeutic Level Labs: No results found for: LITHIUM No results found for: VALPROATE No results found for: CBMZ  Screenings:  AIMS    Flowsheet Row [facility] Visit [facility]/20/2024 in [facility]  AIMS Total Score 6   GAD-7    Flowsheet Row Office Visit from 05/02/2024 in [facility] Office Visit from 03/07/2024 in [facility]  Total GAD-7 Score 0 0   PHQ2-9    Flowsheet Row Office Visit from 05/02/2024 in [facility] Office Visit from 03[facility]07/2024 in [facility] Office Visit from 01/29/2023 in [facility] Office Visit from 09[facility]11/2022 in [facility] Office Visit from 03/13/2022 in [facility]  PHQ-2 Total Score 0 0 1 1 0   Flowsheet Row Office Visit from 01/29/2023 in [facility] Office Visit from 09/11/2022 in [facility] Office Visit from 03/13/2022 in [facility]  C-SSRS RISK CATEGORY No Risk No Risk No Risk   Collaboration of Care:  Patient/Guardian was advised Release of Information must be obtained prior to any record release in order to  collaborate their care with an outside provider. Patient/Guardian was advised  if they have not already done so to contact the registration department to sign all necessary forms in order for us  to release information regarding their care.   Consent: Patient/Guardian gives verbal consent for treatment and assignment of benefits for services provided during this visit. Patient/Guardian expressed understanding and agreed to proceed.    Marlo Masson, MD 07/20/2024, 3:32 PM

## 2024-07-20 NOTE — Patient Instructions (Signed)
 Medication Taper Instructions: Abilify  (aripiprazole )  You are being instructed to gradually decrease your Abilify  dose as follows:  Take 10 mg once daily for 2 weeks. Then take 5 mg once daily for the next 2 weeks. After that, stop taking Abilify .

## 2024-07-21 ENCOUNTER — Telehealth (HOSPITAL_COMMUNITY): Payer: Self-pay

## 2024-07-21 NOTE — Telephone Encounter (Signed)
 Patients prescription of Austedo XR was approved by insurance. I called the pharmacy and I called the patient to let them know

## 2024-08-08 ENCOUNTER — Other Ambulatory Visit (HOSPITAL_BASED_OUTPATIENT_CLINIC_OR_DEPARTMENT_OTHER): Payer: Self-pay | Admitting: Neurosurgery

## 2024-08-08 DIAGNOSIS — M5440 Lumbago with sciatica, unspecified side: Secondary | ICD-10-CM

## 2024-08-09 ENCOUNTER — Other Ambulatory Visit (HOSPITAL_COMMUNITY): Payer: Self-pay | Admitting: Student in an Organized Health Care Education/Training Program

## 2024-08-09 DIAGNOSIS — F952 Tourette's disorder: Secondary | ICD-10-CM

## 2024-08-10 ENCOUNTER — Ambulatory Visit (HOSPITAL_BASED_OUTPATIENT_CLINIC_OR_DEPARTMENT_OTHER)
Admission: RE | Admit: 2024-08-10 | Discharge: 2024-08-10 | Disposition: A | Discharge status: Home / Self Care | Payer: Self-pay | Source: Ambulatory Visit | Attending: Neurosurgery | Admitting: Neurosurgery

## 2024-08-10 ENCOUNTER — Other Ambulatory Visit (HOSPITAL_BASED_OUTPATIENT_CLINIC_OR_DEPARTMENT_OTHER): Payer: Self-pay | Admitting: Radiology

## 2024-08-10 DIAGNOSIS — M5440 Lumbago with sciatica, unspecified side: Secondary | ICD-10-CM

## 2024-08-26 ENCOUNTER — Telehealth (HOSPITAL_COMMUNITY): Payer: Self-pay | Admitting: *Deleted

## 2024-08-26 ENCOUNTER — Other Ambulatory Visit: Payer: Self-pay | Admitting: Allergy and Immunology

## 2024-08-26 NOTE — Telephone Encounter (Signed)
 Pt called with c/o what she says are s/e from the Austedo  XR 6 mg. Pt describes bright red flushing of her face and well as episodes of being diaphoretic. Pt says these symptoms started when she started the Austedo  which she has been taking for a month. Please review and advise.

## 2024-08-26 NOTE — Telephone Encounter (Addendum)
 Contacted and spoke with patient on 08/26/2024.  She reports she has noticed increased sweating from baseline since being started on Austedo .  She reports she is on Lyrica  for her hot flashes but noticed she had an episode of face flushing the day before.  Otherwise the patient denies any other side effects to the medications.  She denies any symptoms of confusion or muscle stiffness.  She reports her TD symptoms have improved however.  Asked patient to take her temperature, which she reported was 97.7 F.  The patient also took her vitals over the phone she reported BP 127/79, HR 81.  Patient has scheduled appointment with me on 09/05/2024.  The patient reports she is willing to stay on the current dose of Austedo  until then.  We did discuss the possibility of reducing the dose by half to see if the symptoms are better controlled.  Encourage patient to maintain fluids and electrolytes.

## 2024-08-28 ENCOUNTER — Other Ambulatory Visit: Payer: Self-pay | Admitting: Allergy and Immunology

## 2024-09-01 ENCOUNTER — Telehealth (HOSPITAL_COMMUNITY): Payer: Self-pay | Admitting: Student in an Organized Health Care Education/Training Program

## 2024-09-01 NOTE — Telephone Encounter (Signed)
 Attempted to contact pt, no answer. LVM letting her know I will try to reach out again tomorrow.

## 2024-09-05 ENCOUNTER — Ambulatory Visit (HOSPITAL_COMMUNITY): Admitting: Student in an Organized Health Care Education/Training Program

## 2024-09-15 ENCOUNTER — Ambulatory Visit: Admitting: Podiatry

## 2024-09-21 ENCOUNTER — Ambulatory Visit (HOSPITAL_COMMUNITY): Admitting: Student in an Organized Health Care Education/Training Program

## 2024-10-27 ENCOUNTER — Ambulatory Visit: Admitting: Podiatry

## 2025-01-25 ENCOUNTER — Ambulatory Visit: Admitting: Allergy and Immunology
